# Patient Record
Sex: Female | Born: 1935 | Race: White | Hispanic: No | Smoking: Never smoker
Health system: Southern US, Community
[De-identification: ages and names within clinical notes are randomized; demographics above are authoritative.]

## PROBLEM LIST (undated history)

## (undated) DIAGNOSIS — Z9889 Other specified postprocedural states: Secondary | ICD-10-CM

## (undated) DIAGNOSIS — I509 Heart failure, unspecified: Secondary | ICD-10-CM

## (undated) DIAGNOSIS — I4891 Unspecified atrial fibrillation: Secondary | ICD-10-CM

## (undated) DIAGNOSIS — K219 Gastro-esophageal reflux disease without esophagitis: Secondary | ICD-10-CM

## (undated) DIAGNOSIS — E785 Hyperlipidemia, unspecified: Secondary | ICD-10-CM

## (undated) DIAGNOSIS — J449 Chronic obstructive pulmonary disease, unspecified: Secondary | ICD-10-CM

## (undated) DIAGNOSIS — I10 Essential (primary) hypertension: Secondary | ICD-10-CM

## (undated) DIAGNOSIS — Z972 Presence of dental prosthetic device (complete) (partial): Secondary | ICD-10-CM

## (undated) DIAGNOSIS — M1990 Unspecified osteoarthritis, unspecified site: Secondary | ICD-10-CM

## (undated) DIAGNOSIS — R0600 Dyspnea, unspecified: Secondary | ICD-10-CM

## (undated) DIAGNOSIS — R112 Nausea with vomiting, unspecified: Secondary | ICD-10-CM

## (undated) DIAGNOSIS — D649 Anemia, unspecified: Secondary | ICD-10-CM

## (undated) DIAGNOSIS — E119 Type 2 diabetes mellitus without complications: Secondary | ICD-10-CM

## (undated) DIAGNOSIS — G4730 Sleep apnea, unspecified: Secondary | ICD-10-CM

## (undated) HISTORY — PX: CHOLECYSTECTOMY: SHX55

## (undated) HISTORY — PX: ABDOMINAL HYSTERECTOMY: SHX81

## (undated) HISTORY — PX: SHOULDER ARTHROSCOPY: SHX128

---

## 2004-09-13 ENCOUNTER — Ambulatory Visit: Payer: Self-pay

## 2004-12-08 ENCOUNTER — Emergency Department: Payer: Self-pay | Admitting: Unknown Physician Specialty

## 2006-04-03 ENCOUNTER — Ambulatory Visit: Payer: Self-pay | Admitting: Family Medicine

## 2006-11-25 ENCOUNTER — Emergency Department: Payer: Self-pay

## 2007-02-12 ENCOUNTER — Ambulatory Visit: Payer: Self-pay | Admitting: Gastroenterology

## 2007-03-07 ENCOUNTER — Ambulatory Visit: Payer: Self-pay | Admitting: Gastroenterology

## 2007-06-25 ENCOUNTER — Ambulatory Visit: Payer: Self-pay | Admitting: Family Medicine

## 2008-04-03 ENCOUNTER — Emergency Department: Payer: Self-pay

## 2008-04-03 ENCOUNTER — Other Ambulatory Visit: Payer: Self-pay

## 2008-09-15 ENCOUNTER — Ambulatory Visit: Payer: Self-pay | Admitting: Family Medicine

## 2009-10-20 ENCOUNTER — Ambulatory Visit: Payer: Self-pay | Admitting: Family Medicine

## 2010-10-26 ENCOUNTER — Ambulatory Visit: Payer: Self-pay | Admitting: Family Medicine

## 2010-12-08 ENCOUNTER — Ambulatory Visit: Payer: Self-pay | Admitting: Unknown Physician Specialty

## 2011-03-21 ENCOUNTER — Ambulatory Visit: Payer: Self-pay | Admitting: Unknown Physician Specialty

## 2011-03-27 ENCOUNTER — Ambulatory Visit: Payer: Self-pay | Admitting: Unknown Physician Specialty

## 2011-05-25 ENCOUNTER — Ambulatory Visit: Payer: Self-pay | Admitting: Unknown Physician Specialty

## 2011-08-04 ENCOUNTER — Ambulatory Visit: Payer: Self-pay | Admitting: Unknown Physician Specialty

## 2012-01-09 LAB — BASIC METABOLIC PANEL
BUN: 20 mg/dL — ABNORMAL HIGH (ref 7–18)
Calcium, Total: 10 mg/dL (ref 8.5–10.1)
Co2: 27 mmol/L (ref 21–32)
EGFR (African American): 58 — ABNORMAL LOW
Osmolality: 282 (ref 275–301)
Potassium: 3.9 mmol/L (ref 3.5–5.1)

## 2012-01-09 LAB — CBC
HGB: 11.9 g/dL — ABNORMAL LOW (ref 12.0–16.0)
MCH: 25.3 pg — ABNORMAL LOW (ref 26.0–34.0)
MCHC: 31.2 g/dL — ABNORMAL LOW (ref 32.0–36.0)
MCV: 81 fL (ref 80–100)
Platelet: 228 10*3/uL (ref 150–440)
RBC: 4.68 10*6/uL (ref 3.80–5.20)
RDW: 14.6 % — ABNORMAL HIGH (ref 11.5–14.5)
WBC: 6.9 10*3/uL (ref 3.6–11.0)

## 2012-01-09 LAB — CK TOTAL AND CKMB (NOT AT ARMC): CK, Total: 50 U/L (ref 21–215)

## 2012-01-09 LAB — TROPONIN I: Troponin-I: <0.02 ng/mL

## 2012-01-10 ENCOUNTER — Observation Stay: Payer: Self-pay | Admitting: Student

## 2012-01-10 LAB — URINALYSIS, COMPLETE
Ketone: NEGATIVE
Leukocyte Esterase: NEGATIVE
Nitrite: POSITIVE
Ph: 5 (ref 4.5–8.0)
Protein: NEGATIVE
RBC,UR: 1 /HPF (ref 0–5)
Specific Gravity: 1.012 (ref 1.003–1.030)

## 2012-01-10 LAB — CK TOTAL AND CKMB (NOT AT ARMC)
CK, Total: 36 U/L (ref 21–215)
CK, Total: 43 U/L (ref 21–215)
CK-MB: 1.2 ng/mL (ref 0.5–3.6)
CK-MB: 1.1 ng/mL (ref 0.5–3.6)

## 2012-01-11 LAB — BASIC METABOLIC PANEL
BUN: 24 mg/dL — ABNORMAL HIGH (ref 7–18)
Calcium, Total: 9.2 mg/dL (ref 8.5–10.1)
Chloride: 103 mmol/L (ref 98–107)
Creatinine: 1.11 mg/dL (ref 0.60–1.30)
EGFR (African American): 57 — ABNORMAL LOW
Osmolality: 283 (ref 275–301)
Potassium: 3.8 mmol/L (ref 3.5–5.1)
Sodium: 140 mmol/L (ref 136–145)

## 2012-01-11 LAB — LIPID PANEL
Cholesterol: 147 mg/dL (ref 0–200)
HDL Cholesterol: 34 mg/dL — ABNORMAL LOW (ref 40–60)
Ldl Cholesterol, Calc: 49 mg/dL (ref 0–100)

## 2012-01-11 LAB — MAGNESIUM: Magnesium: 1.6 mg/dL — ABNORMAL LOW

## 2013-01-03 ENCOUNTER — Ambulatory Visit: Payer: Self-pay | Admitting: Gastroenterology

## 2013-09-04 ENCOUNTER — Emergency Department: Payer: Self-pay | Admitting: Emergency Medicine

## 2013-09-06 ENCOUNTER — Emergency Department: Payer: Self-pay | Admitting: Emergency Medicine

## 2013-09-09 ENCOUNTER — Ambulatory Visit: Payer: Self-pay | Admitting: Family Medicine

## 2013-11-24 ENCOUNTER — Emergency Department: Payer: Self-pay | Admitting: Emergency Medicine

## 2014-03-31 ENCOUNTER — Ambulatory Visit: Payer: Self-pay | Admitting: Family Medicine

## 2014-05-23 ENCOUNTER — Emergency Department: Payer: Self-pay | Admitting: Emergency Medicine

## 2014-12-31 ENCOUNTER — Ambulatory Visit
Admit: 2014-12-31 | Disposition: A | Discharge status: Home / Self Care | Payer: Self-pay | Attending: Family Medicine | Admitting: Family Medicine

## 2014-12-31 LAB — COMPREHENSIVE METABOLIC PANEL
ALBUMIN: 3.9 g/dL
ALT: 16 U/L
Alkaline Phosphatase: 76 U/L
Anion Gap: 8 (ref 7–16)
BILIRUBIN TOTAL: 0.4 mg/dL
BUN: 19 mg/dL
CREATININE: 0.99 mg/dL
Calcium, Total: 9 mg/dL
Chloride: 105 mmol/L
Co2: 29 mmol/L
EGFR (African American): >60
GFR CALC NON AF AMER: 55 — AB
GLUCOSE: 58 mg/dL — AB
Potassium: 4.5 mmol/L
SGOT(AST): 24 U/L
SODIUM: 142 mmol/L
TOTAL PROTEIN: 7 g/dL

## 2014-12-31 LAB — CBC WITH DIFFERENTIAL/PLATELET
BASOS PCT: 0.7 %
Basophil #: 0 10*3/uL (ref 0.0–0.1)
EOS ABS: 0.1 10*3/uL (ref 0.0–0.7)
EOS PCT: 1.8 %
HCT: 28 % — ABNORMAL LOW (ref 35.0–47.0)
HGB: 8.5 g/dL — AB (ref 12.0–16.0)
LYMPHS PCT: 18 %
Lymphocyte #: 1.1 10*3/uL (ref 1.0–3.6)
MCH: 22.1 pg — ABNORMAL LOW (ref 26.0–34.0)
MCHC: 30.3 g/dL — ABNORMAL LOW (ref 32.0–36.0)
MCV: 73 fL — ABNORMAL LOW (ref 80–100)
Monocyte #: 0.5 x10 3/mm (ref 0.2–0.9)
Monocyte %: 8.5 %
NEUTROS PCT: 71 %
Neutrophil #: 4.4 10*3/uL (ref 1.4–6.5)
Platelet: 187 10*3/uL (ref 150–440)
RBC: 3.84 10*6/uL (ref 3.80–5.20)
RDW: 15.7 % — ABNORMAL HIGH (ref 11.5–14.5)
WBC: 6.2 10*3/uL (ref 3.6–11.0)

## 2015-01-03 NOTE — H&P (Signed)
PATIENT NAME:  Dorothy [name], Dorothy [name] MR#:  [id number] DATE OF BIRTH:  1935/07/17  DATE OF ADMISSION:  01/10/2012  REFERRING PHYSICIAN: Dr. Owens Shark   PRIMARY PHYSICIAN: [facility]    PRESENTING COMPLAINT: Sent over from the clinic with diagnosis of new onset atrial fibrillation.   HISTORY OF PRESENT ILLNESS: Dorothy [name] is a pleasant 80 year old woman with history of diabetes, hypertension, hyperlipidemia, and gastric ulcers who presents from [facility] with diagnosis of new onset atrial fibrillation. Apparently the patient presented for routine check-up and on exam was found out to have irregular rhythm and EKG obtained was revealing for atrial fibrillation, rate of 107. Since her arrival here in the ED, she has remained normal sinus, rate controlled, and her EKG confirms a normal sinus rate. She denies any palpitations or sensations of dysrhythmia. No presyncope or syncope. Reports that she has been having nausea for one week now mainly in the morning but it would resolve. She has chronic baseline edema that gets worse when she is sedentary. She also reports baseline wheezing with allergies. She denies any prior history. Denies any fevers or chills. No dysuria or hematuria. No diarrhea.   PAST MEDICAL HISTORY:  1. Diabetes.  2. Allergies.  3. Hypertension.  4. Hyperlipidemia.  5. Gastroesophageal reflux disease.  6. Osteoarthritis.  7. Gastric ulcers.  8. 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  9. History of small bowel obstruction requiring surgery.  10. June 2008 colonoscopy with diverticulosis and EGD with hiatal hernia and noted Billroth-I anastomosis.   PAST SURGICAL HISTORY:  1. Left shoulder surgery.  2. Cholecystectomy.  3. Hysterectomy.  4. Appendectomy.  5. Billroth as above.   ALLERGIES: The patient is intolerant to aspirin.   MEDICATIONS:  1. Atenolol/chlorthalidone 50/25 mg daily.  2. Calcium 600 Plus D daily.  3. Furosemide 20 mg  daily.  4. Glipizide XL 10 mg every 12 hours.  5. Lovastatin 40 mg at bedtime.  6. Metformin 1000 mg b.i.d.  7. Nexium 40 mg daily.  8. Novolin N 35 units at bedtime.  9. Quinapril 40 mg b.i.d.  10. Tylenol as needed.   FAMILY HISTORY: Father died of lung cancer and he had hypertension. Mother had hypertension. Brother died of cancer. One sister had diabetes. Another sister died of cancer.   SOCIAL HISTORY: She lives in [place] alone. She has been widowed since January of 2013. She has two children. One lives 10 minutes away from her and another lives in [place]. Denies any tobacco, alcohol, or drug use.  REVIEW OF SYSTEMS: CONSTITUTIONAL: No fevers or chills. She endorses nausea in the morning. EYES: No glaucoma or cataracts. ENT: No epistaxis or discharge. RESPIRATORY: No cough. She reports some occasional wheezing with her allergies that's not new. No hemoptysis. No shortness of breath. CARDIOVASCULAR: As per history of present illness. GI: Endorses nausea in the morning but subsides as the day progresses. No vomiting, diarrhea, abdominal pain, hematemesis. GU: No dysuria or hematuria. ENDOCRINE: No polyuria or polydipsia. HEME: No easy bleeding. SKIN: No ulcers. MUSCULOSKELETAL: She has pain related to her arthritis. NEUROLOGIC: No history of stroke or seizure. PSYCH: Denies any suicidal ideation.      PHYSICAL EXAMINATION:   VITAL SIGNS: Temperature 98.3, pulse 76, respiratory rate 20, blood pressure 186/84, repeat pressure 181/83, sating at 95% on room air.   GENERAL: Lying in bed in no apparent distress.   HEENT: Normocephalic, atraumatic. Pupils equal, symmetric, nonicteric. Nares without discharge. Moist mucous  membrane.   NECK: Soft and supple. No adenopathy or JVP.   CARDIOVASCULAR: Non-tachy, regular. No murmurs, rubs, or gallops.   LUNGS: Faint basilar crackles. No use of accessory muscles or increased respiratory effort.   ABDOMEN: Soft. Positive bowel sounds. No  mass appreciated.   EXTREMITIES: Trace edema bilaterally. Dorsal pedis pulses intact.   MUSCULOSKELETAL: No joint effusion.   SKIN: No ulcer. She has hyperpigmentation of her lower extremity with taut skin.   NEUROLOGIC: No dysarthria or aphasia. Symmetrical strength. No focal deficits.   PSYCH: She is alert and oriented. The patient is cooperative.   PERTINENT LABS AND STUDIES: WBC 6.9, hemoglobin 11.9, hematocrit 38, platelets 228, MCV 81, glucose 170, BUN 20, creatinine 1.09, sodium 138, potassium 3.9, chloride 103, carbon dioxide 27, calcium 10. Troponin less than 0.02. CK 50. MB 1.6.   EKG with sinus rate of 79. No ST elevation or depression. There is T wave inversion in aVR and V1.   ASSESSMENT AND PLAN: Dorothy [name] is a pleasant 80 year old woman with history of hypertension, diabetes, hyperlipidemia, gastroesophageal reflux disease, gastric ulcers, and diverticulosis presenting from [facility] on routine follow-up with new onset atrial fibrillation.  1. New onset atrial fibrillation. She has some faint basilar crackles on exam but not severely hypoxic but she is 95% on room air. Will go ahead and obtain a chest x-ray. In the ED she has remained in normal sinus rhythm. Will also send TSH, mag level, and urinalysis. Continue on tele. Cycle cardiac enzymes. Her EKG is unrevealing. Will obtain an echocardiogram. The patient is intolerant to aspirin. Restart her atenolol. Will obtain Cardiology consultation. Additional diagnostics and anticoagulation recommendations per Cardiology.  2. Hypertension, uncontrolled. She has not had her medications today. Will resume atenolol/chlorthalidone and quinapril.  3. Diabetes. Restart her glipizide and Novolin N. Hold her metformin. Continue on sliding scale insulin. Send an A1c.  4. Hyperlipidemia. Restart lovastatin. Send a fasting lipid panel.  5. Prophylaxis. On Nexium and Lovenox.   TIME SPENT: Approximately 50 minutes spent on patient  care.   ____________________________ Rita Ohara, MD ap:drc D: 01/10/2012 01:22:00 ET T: 01/10/2012 08:51:08 ET JOB#: [id number]  cc: Brien Few Yorel Redder, MD, <Dictator> [place] MD ELECTRONICALLY SIGNED 01/18/2012 23:24

## 2015-01-03 NOTE — Discharge Summary (Signed)
PATIENT NAME:  Dorothy [name], Dorothy [name] MR#:  [id number] DATE OF BIRTH:  Sep 06, 1935  DATE OF ADMISSION:  01/10/2012 DATE OF DISCHARGE:  01/11/2012  REFERRING PHYSICIAN: Dr. Owens Shark   PRIMARY CARE PHYSICIAN: [facility]   CHIEF COMPLAINT: Sent over from the clinic with diagnosis of new onset atrial fibrillation.   DISCHARGE DIAGNOSES:  1. Possible atrial fibrillation without any recurrence and without any history of atrial fibrillation. 2. Uncontrolled diabetes. 3. Elevated triglycerides. 4. Hypertension.  5. Hyperlipidemia.  6. Gastroesophageal reflux disease.  7. Osteoarthritis.  8. History of gastric ulcers. 9. History of left leg squamous cell carcinoma status post resection. 10. History of SBO status post surgery.   DISCHARGE MEDICATIONS:  1. Atenolol/chlorthalidone 50/25 mg 1 tab daily.  2. Calcium 600 mg Plus D daily.  3. Lasix 20 mg daily.  4. Glipizide XL 10 mg every 12 hours. 5. Lovastatin 40 mg at bedtime.  6. Metformin 1000 mg p.o. b.i.d.  7. Nexium 40 mg daily.  8. Novolin N 35 units at bedtime.  9. Quinapril 40 mg b.i.d.  10. Tylenol as needed.   DISPOSITION: Home.   DIET: Low sodium, ADA, diabetic diet.   ACTIVITY: As tolerated.   FOLLOW-UP:  1. Please follow-up with your primary care physician in 1 to 2 days for diabetes and triglyceride management. 2. Follow-up of the echocardiogram that was done here which is not back yet.   HISTORY OF PRESENT ILLNESS/HOSPITAL COURSE: For full details of history and physical, please see the dictation done on 01/10/2012 by Dr. Inez Catalina. Briefly, this is a 80 year old female with diabetes, hypertension, hyperlipidemia, and gastric ulcers who was referred for further evaluation after an EKG at the office had shown possible atrial fibrillation. The rate was 107. She has had no further episodes of atrial fibrillation. In the ER she was noted to have an EKG with normal sinus rhythm. She was started on telemetry for rhythm  monitoring. Her outpatient medications were resumed.   Her significant labs were as follows: BUN on arrival 20, serum glucose 170, triglycerides 319, LDL of 49, HDL 34. Hemoglobin A1c elevated at 9.6. Troponins were negative x3. TSH was 1.62. Urinalysis has positive nitrites but no leukocyte esterase and 1+ bacteria; however, the patient has no dysuria. An echocardiogram was done which has not been finalized yet. X-ray of the chest, one view, does not show any acute evidence of pulmonary edema or any other acute cardiopulmonary abnormalities.   The patient was admitted for observation. Cyclic cardiac markers were negative. She had no symptoms of dizziness, chest pains, palpitations, or visual problems. She was placed on remote tele and observed overnight and had no further recurrence of this arrhythmia. On further evaluation of EKG sent from the primary care's office, do see some P waves in 1 at times as well as 2. The EKG that was sent does have conversion to regular sinus rhythm on the same EKG strip. Given that she has no history of atrial fibrillation and I see some P waves at some of the waves, I would not start the patient on anticoagulation for now. She is adequately rate controlled. She has had rates of 50's and 60's on the atenolol here. TSH was checked and was within normal limits. At this point given that there have been no further recurrence of this rhythm, she will be discharged with outpatient follow-up.   With regards to the high blood sugars, her hemoglobin A1c was checked which was mid 9's. She needs a tighter  control of her blood sugars as an outpatient. Furthermore, she was noted to have high triglycerides in the 300's and she should be placed on a triglyceride lowering medication. This can all be done as an outpatient. At this point, she will be discharged with outpatient follow-up with her primary care physician at [facility].   TOTAL TIME SPENT: 35 minutes.    ____________________________ Vivien Presto, MD sa:drc D: 01/11/2012 14:48:53 ET T: 01/12/2012 10:33:05 ET JOB#: [id number]  cc: Vivien Presto, MD, <Dictator> [place] MD ELECTRONICALLY SIGNED 01/24/2012 17:55

## 2015-01-03 NOTE — Consult Note (Signed)
PATIENT NAME:  KALAN, RANG MR#:  [id number] DATE OF BIRTH:  Jan 22, 1935  DATE OF CONSULTATION:  01/11/2012  REFERRING PHYSICIAN:  Alounthith Phichith, MD  CONSULTING PHYSICIAN:  Caitlin Hillmer D. Clayborn Bigness, MD  PRIMARY CARE PHYSICIAN: [facility]   INDICATION: Atrial fibrillation.   HISTORY OF PRESENT ILLNESS: Ms. Bunner is a 80 year old female with history of diabetes, hypertension, hyperlipidemia, and ulcers who recently complained of GI upset with diarrhea and fatigue. She was found to be in atrial fibrillation on routine check in the office with rates above 100. She was sent to the Emergency Room for further evaluation. Rate was controlled. Repeat EKG suggested sinus rhythm. She denies any significant palpitations, tachycardia, or arrhythmias. She's only had diarrhea. No presyncope. No chest pain. She's had some nausea for about a week or so. She has had baseline leg edema, mild wheezing related to allergies. No headaches. Except for the weakness and the diarrhea, she feels okay.  REVIEW OF SYSTEMS: No blackout spells or syncope. Mild nausea. No vomiting. Denies fever, chills, or sweats. No weight loss, no weight gain. No hemoptysis or hematemesis. Denies bright red blood per rectum. Denies vision change or hearing change. Denies sputum production. Denies cough.   PAST MEDICAL HISTORY:  1. Diabetes.  2. Allergies.  3. Hypertension.  4. Hyperlipidemia.  5. Reflux. 6. Osteoarthritis. 7. Ulcers.  8. Moderate coronary artery disease. 9. Chest pain. 10. Occasional angina.   PAST SURGICAL HISTORY:  1. Left leg squamous cell cancer status post resection. 2. Small bowel obstruction requiring surgery.  3. Colonoscopy for diverticulitis.  4. Hiatal hernia treated with Billroth-I. 5. Left shoulder surgery. 6. Cholecystectomy. 7. Hysterectomy. 8. Appendectomy.   MEDICATIONS:  1. Atenolol/chlorthalidone 50/25 daily.  2. Calcium once a day.  3. Furosemide 20 a day.  4. Glipizide XL  10 every 12 hours. 5. Lovastatin 40 a day.  6. Metformin 1000 twice a day.  7. Nexium 40 a day.  8. Novolin N 35 units at bedtime.  9. Quinapril 40 twice a day.  10. Tylenol as needed.   FAMILY HISTORY: Lung cancer, hypertension, cancer, diabetes.   SOCIAL HISTORY: Lives in [place]. Widowed. Two children. No smoking or alcohol consumption.   PHYSICAL EXAMINATION:   VITAL SIGNS: Blood pressure 180/80, pulse 75, respiratory rate 16, afebrile.   HEENT: Normocephalic, atraumatic. Pupils reactive to light.   NECK: Supple. No JVD, bruits, or adenopathy.   LUNGS: Clear to auscultation and percussion. No significant wheeze, rhonchi, or rales.    HEART: Regular rate and rhythm. Positive S4. Systolic ejection murmur left sternal border.   ABDOMEN: Benign.   EXTREMITIES: Within normal limits.   NEUROLOGIC: Intact.   SKIN: Normal   LABORATORY, DIAGNOSTIC, AND RADIOLOGICAL DATA: White count 6.9, hemoglobin 11.9, hematocrit 38, platelet count 228, BUN 20, creatinine 1.09, glucose 170, sodium 138, potassium 3.9. Troponin was negative. MB and CK were negative.   EKG normal sinus rhythm, rate of 80, nonspecific ST-T wave changes.   ASSESSMENT:  1. History of atrial fibrillation, now sinus. 2. Hypertension. 3. Diabetes. 4. Hyperlipidemia. 5. Atypical chest pain. 6. History of moderate coronary artery disease.   PLAN:  1. Will treat medically. 2. Will consider whether long-term anticoagulation is necessary. 3. Will probably treat the patient conservatively as she's had trouble even with aspirin in the past with stomach upset and I'm concerned that long-term anticoagulation will pose a problem with her history of reflux and gastroesophageal reflux disease. Now she's in sinus.  4. Recommend weight  loss and exercise.  5. Continue blood pressure control.  6. Recommend conservative approach for her atrial fibrillation at this point.  ____________________________ Loran Senters. Clayborn Bigness,  MD ddc:drc D: 01/11/2012 16:42:24 ET T: 01/12/2012 08:11:21 ET JOB#: [id number]  cc: Evon Dejarnett D. Clayborn Bigness, MD, <Dictator> Yolonda Kida MD ELECTRONICALLY SIGNED 01/17/2012 22:57

## 2015-01-08 ENCOUNTER — Inpatient Hospital Stay
Admission: AD | Admit: 2015-01-08 | Discharge: 2015-01-10 | DRG: 293 | Disposition: A | Discharge status: Home / Self Care | Payer: Medicare Other | Attending: Internal Medicine | Admitting: Internal Medicine

## 2015-01-08 DIAGNOSIS — M810 Age-related osteoporosis without current pathological fracture: Secondary | ICD-10-CM | POA: Diagnosis present

## 2015-01-08 DIAGNOSIS — Z801 Family history of malignant neoplasm of trachea, bronchus and lung: Secondary | ICD-10-CM

## 2015-01-08 DIAGNOSIS — Z79899 Other long term (current) drug therapy: Secondary | ICD-10-CM | POA: Diagnosis not present

## 2015-01-08 DIAGNOSIS — J8410 Pulmonary fibrosis, unspecified: Secondary | ICD-10-CM | POA: Diagnosis present

## 2015-01-08 DIAGNOSIS — Z794 Long term (current) use of insulin: Secondary | ICD-10-CM | POA: Diagnosis not present

## 2015-01-08 DIAGNOSIS — D649 Anemia, unspecified: Secondary | ICD-10-CM | POA: Diagnosis present

## 2015-01-08 DIAGNOSIS — I10 Essential (primary) hypertension: Secondary | ICD-10-CM | POA: Diagnosis present

## 2015-01-08 DIAGNOSIS — Z9889 Other specified postprocedural states: Secondary | ICD-10-CM

## 2015-01-08 DIAGNOSIS — Z7951 Long term (current) use of inhaled steroids: Secondary | ICD-10-CM | POA: Diagnosis not present

## 2015-01-08 DIAGNOSIS — I5033 Acute on chronic diastolic (congestive) heart failure: Secondary | ICD-10-CM | POA: Diagnosis present

## 2015-01-08 DIAGNOSIS — I4891 Unspecified atrial fibrillation: Secondary | ICD-10-CM | POA: Diagnosis present

## 2015-01-08 DIAGNOSIS — E119 Type 2 diabetes mellitus without complications: Secondary | ICD-10-CM | POA: Diagnosis present

## 2015-01-08 DIAGNOSIS — E785 Hyperlipidemia, unspecified: Secondary | ICD-10-CM | POA: Diagnosis present

## 2015-01-08 LAB — CBC
HCT: 28.7 % — ABNORMAL LOW (ref 35.0–47.0)
HGB: 8.6 g/dL — ABNORMAL LOW (ref 12.0–16.0)
MCH: 22.3 pg — ABNORMAL LOW (ref 26.0–34.0)
MCHC: 30.1 g/dL — ABNORMAL LOW (ref 32.0–36.0)
MCV: 74 fL — AB (ref 80–100)
PLATELETS: 219 10*3/uL (ref 150–440)
RBC: 3.88 10*6/uL (ref 3.80–5.20)
RDW: 16.1 % — AB (ref 11.5–14.5)
WBC: 6.9 10*3/uL (ref 3.6–11.0)

## 2015-01-08 LAB — BASIC METABOLIC PANEL
Anion Gap: 7 (ref 7–16)
BUN: 18 mg/dL
CO2: 27 mmol/L
CREATININE: 1.03 mg/dL — AB
Calcium, Total: 9.1 mg/dL
Chloride: 104 mmol/L
EGFR (African American): >60
EGFR (Non-African Amer.): 52 — ABNORMAL LOW
GLUCOSE: 133 mg/dL — AB
POTASSIUM: 4.8 mmol/L
SODIUM: 138 mmol/L

## 2015-01-08 LAB — TROPONIN I
Troponin-I: <0.03 ng/mL
Troponin-I: <0.03 ng/mL
Troponin-I: <0.03 ng/mL

## 2015-01-08 LAB — IRON AND TIBC
IRON BIND. CAP.(TOTAL): 508 — AB (ref 250–450)
IRON SATURATION: 3.3
Iron: 17 ug/dL — ABNORMAL LOW
UNBOUND IRON-BIND. CAP.: 491.5

## 2015-01-08 LAB — FERRITIN: FERRITIN (ARMC): 6 ng/mL — AB

## 2015-01-08 LAB — RETICULOCYTES
ABSOLUTE RETIC COUNT: 0.0651 10*6/uL (ref 0.019–0.186)
Reticulocyte: 1.67 % (ref 0.4–3.1)

## 2015-01-08 LAB — PRO B NATRIURETIC PEPTIDE: B-TYPE NATIURETIC PEPTID: 321 pg/mL — AB

## 2015-01-09 LAB — BASIC METABOLIC PANEL
ANION GAP: 7 (ref 7–16)
BUN: 17 mg/dL
CALCIUM: 9.2 mg/dL
CHLORIDE: 104 mmol/L
CREATININE: 1.33 mg/dL — AB
Co2: 30 mmol/L
EGFR (African American): 45 — ABNORMAL LOW
GFR CALC NON AF AMER: 38 — AB
GLUCOSE: 142 mg/dL — AB
Potassium: 4.3 mmol/L
Sodium: 141 mmol/L

## 2015-01-09 LAB — CBC WITH DIFFERENTIAL/PLATELET
BASOS ABS: 0 10*3/uL (ref 0.0–0.1)
Basophil %: 0.3 %
Eosinophil #: 0.1 10*3/uL (ref 0.0–0.7)
Eosinophil %: 1.6 %
HCT: 26.8 % — AB (ref 35.0–47.0)
HGB: 8.4 g/dL — ABNORMAL LOW (ref 12.0–16.0)
LYMPHS PCT: 20.4 %
Lymphocyte #: 1.4 10*3/uL (ref 1.0–3.6)
MCH: 22.8 pg — ABNORMAL LOW (ref 26.0–34.0)
MCHC: 31.3 g/dL — ABNORMAL LOW (ref 32.0–36.0)
MCV: 73 fL — ABNORMAL LOW (ref 80–100)
MONOS PCT: 7.3 %
Monocyte #: 0.5 x10 3/mm (ref 0.2–0.9)
NEUTROS ABS: 4.7 10*3/uL (ref 1.4–6.5)
Neutrophil %: 70.4 %
PLATELETS: 205 10*3/uL (ref 150–440)
RBC: 3.67 10*6/uL — ABNORMAL LOW (ref 3.80–5.20)
RDW: 16 % — ABNORMAL HIGH (ref 11.5–14.5)
WBC: 6.7 10*3/uL (ref 3.6–11.0)

## 2015-01-09 MED ORDER — ALBUTEROL SULFATE (2.5 MG/3ML) 0.083% IN NEBU: 2.5000 mg | INHALATION_SOLUTION | RESPIRATORY_TRACT | Status: DC | PRN

## 2015-01-09 MED ORDER — ACETAMINOPHEN 325 MG PO TABS: 650.0000 mg | ORAL_TABLET | ORAL | Status: DC | PRN

## 2015-01-09 MED ORDER — ZOLPIDEM TARTRATE 5 MG PO TABS: 5.0000 mg | ORAL_TABLET | Freq: Every evening | ORAL | Status: DC | PRN

## 2015-01-09 MED ORDER — AMLODIPINE BESYLATE 5 MG PO TABS
5.0000 mg | ORAL_TABLET | Freq: Every day | ORAL | Status: DC
  Administered 2015-01-10: 5 mg via ORAL
  Filled 2015-01-09: qty 1

## 2015-01-09 MED ORDER — ONDANSETRON HCL 4 MG/2ML IJ SOLN: 4.0000 mg | INTRAMUSCULAR | Status: DC | PRN

## 2015-01-09 MED ORDER — GLIPIZIDE 10 MG PO TABS
10.0000 mg | ORAL_TABLET | Freq: Two times a day (BID) | ORAL | Status: DC
  Administered 2015-01-10: 10 mg via ORAL
  Filled 2015-01-09: qty 1

## 2015-01-09 MED ORDER — FUROSEMIDE 10 MG/ML IJ SOLN: 20.0000 mg | Freq: Two times a day (BID) | INTRAMUSCULAR | Status: DC

## 2015-01-09 MED ORDER — INSULIN DETEMIR 100 UNIT/ML ~~LOC~~ SOLN
20.0000 [IU] | Freq: Every day | SUBCUTANEOUS | Status: DC
  Filled 2015-01-09: qty 0.2

## 2015-01-09 MED ORDER — PRAVASTATIN SODIUM 20 MG PO TABS: 40.0000 mg | ORAL_TABLET | Freq: Every day | ORAL | Status: DC

## 2015-01-09 MED ORDER — CLONIDINE HCL 0.1 MG PO TABS
0.1000 mg | ORAL_TABLET | Freq: Two times a day (BID) | ORAL | Status: DC
  Administered 2015-01-10: 0.1 mg via ORAL
  Filled 2015-01-09: qty 1

## 2015-01-09 MED ORDER — PANTOPRAZOLE SODIUM 40 MG PO TBEC
40.0000 mg | DELAYED_RELEASE_TABLET | Freq: Every day | ORAL | Status: DC
  Administered 2015-01-10: 40 mg via ORAL
  Filled 2015-01-09: qty 1

## 2015-01-09 MED ORDER — ALBUTEROL SULFATE (2.5 MG/3ML) 0.083% IN NEBU: 2.5000 mg | INHALATION_SOLUTION | RESPIRATORY_TRACT | Status: DC

## 2015-01-09 MED ORDER — INSULIN ASPART 100 UNIT/ML ~~LOC~~ SOLN: 0.0000 [IU] | Freq: Three times a day (TID) | SUBCUTANEOUS | Status: DC

## 2015-01-09 MED ORDER — CALCIUM CARBONATE-VITAMIN D 500-200 MG-UNIT PO TABS
1.0000 | ORAL_TABLET | Freq: Every day | ORAL | Status: DC
  Administered 2015-01-10: 1 via ORAL
  Filled 2015-01-09: qty 1

## 2015-01-09 MED ORDER — METFORMIN HCL ER 500 MG PO TB24
500.0000 mg | ORAL_TABLET | Freq: Two times a day (BID) | ORAL | Status: DC
  Administered 2015-01-10: 500 mg via ORAL
  Filled 2015-01-09: qty 1

## 2015-01-09 MED ORDER — APIXABAN 5 MG PO TABS
5.0000 mg | ORAL_TABLET | Freq: Two times a day (BID) | ORAL | Status: DC
  Administered 2015-01-10: 5 mg via ORAL
  Filled 2015-01-09: qty 1

## 2015-01-09 MED ORDER — SODIUM CHLORIDE 0.9 % IJ SOLN: 3.0000 mL | INTRAMUSCULAR | Status: DC | PRN

## 2015-01-09 MED ORDER — METOPROLOL TARTRATE 25 MG PO TABS
25.0000 mg | ORAL_TABLET | Freq: Two times a day (BID) | ORAL | Status: DC
  Administered 2015-01-10: 25 mg via ORAL
  Filled 2015-01-09: qty 1

## 2015-01-10 DIAGNOSIS — I5033 Acute on chronic diastolic (congestive) heart failure: Secondary | ICD-10-CM

## 2015-01-10 LAB — BASIC METABOLIC PANEL
Anion gap: 10 (ref 5–15)
BUN: 21 mg/dL — AB (ref 6–20)
CALCIUM: 9 mg/dL (ref 8.9–10.3)
CO2: 29 mmol/L (ref 22–32)
Chloride: 99 mmol/L — ABNORMAL LOW (ref 101–111)
Creatinine, Ser: 1.25 mg/dL — ABNORMAL HIGH (ref 0.44–1.00)
GFR, EST AFRICAN AMERICAN: 47 mL/min — AB (ref >60)
GFR, EST NON AFRICAN AMERICAN: 40 mL/min — AB (ref >60)
GLUCOSE: 134 mg/dL — AB (ref 65–99)
Potassium: 4.1 mmol/L (ref 3.5–5.1)
SODIUM: 138 mmol/L (ref 135–145)

## 2015-01-10 LAB — GLUCOSE, CAPILLARY
GLUCOSE-CAPILLARY: 132 mg/dL — AB (ref 70–99)
GLUCOSE-CAPILLARY: 141 mg/dL — AB (ref 70–99)

## 2015-01-10 LAB — HEMOGLOBIN A1C: Hgb A1c MFr Bld: 6.7 % — ABNORMAL HIGH (ref 4.0–6.0)

## 2015-01-10 LAB — OCCULT BLOOD X 1 CARD TO LAB, STOOL: FECAL OCCULT BLD: NEGATIVE

## 2015-01-10 MED ORDER — FUROSEMIDE 20 MG PO TABS: 20.0000 mg | ORAL_TABLET | Freq: Every day | ORAL | Status: DC

## 2015-01-10 MED ORDER — FUROSEMIDE 20 MG PO TABS
20.0000 mg | ORAL_TABLET | Freq: Every day | ORAL | Status: DC
  Administered 2015-01-10: 20 mg via ORAL
  Filled 2015-01-10: qty 1

## 2015-01-10 MED ORDER — AMLODIPINE BESYLATE 5 MG PO TABS: 5.0000 mg | ORAL_TABLET | Freq: Every day | ORAL | Status: DC

## 2015-01-10 MED ORDER — METOPROLOL TARTRATE 25 MG PO TABS: 25.0000 mg | ORAL_TABLET | Freq: Two times a day (BID) | ORAL | Status: DC

## 2015-01-10 NOTE — Progress Notes (Signed)
Previous documentation in SCM, [id number]

## 2015-01-10 NOTE — H&P (Addendum)
PATIENT NAME:  Dorothy [name], HODEL MR#:  [id number] DATE OF BIRTH:  07-02-1935  DATE OF ADMISSION:  01/08/2015  PRIMARY CARE PHYSICIAN: Rutherford Guys MD.   CHIEF COMPLAINT: Shortness of breath and lower extremity edema.   HISTORY OF PRESENT ILLNESS: This is a 80 year old female who presented to the hospital complaining of a 2 week history of worsening lower extremity edema and also shortness of breath on exertion. She also says that she cannot lie flat and sleep, and she has to use 2 pillows to keep herself propped up. Since her symptoms are not improving she came to the ER for further evaluation. In the Emergency Room the patient was not noted to be hypoxic, but was noted to have significant edema and shortness of breath on exertion. The patient was suspected to have mild CHF and hospitalist services were contacted for further treatment and evaluation. The patient also on blood work was incidentally noted to be anemic with a hemoglobin around 8.5. She does not have any previous history of anemia. She denies any evidence of acute bleeding with melena, hematochezia, hematuria, hemoptysis, or any other associated symptoms.   REVIEW OF SYSTEMS:    CONSTITUTIONAL: No documented fever. Positive fatigue and weakness. No weight gain or weight loss.  EYES: No blurry or double vision.  EARS, NOSE, THROAT: No tinnitus. No postnasal drip. No redness of the oropharynx.  RESPIRATORY:  No cough. No wheeze. No hemoptysis. Positive dyspnea.  CARDIOVASCULAR: No chest pain. Positive 2 pillow orthopnea. Positive peripheral edema. Positive dyspnea on exertion. No palpitation or syncope.  GASTROINTESTINAL: No nausea, no vomiting, no diarrhea. No abdominal pain. No melena or hematochezia.  GENITOURINARY: No dysuria or hematuria.  ENDOCRINE: No polyuria, nocturia, heat or cold intolerance.   HEMATOLOGIC:  No anemia, no bruising, no bleeding.  INTEGUMENTARY: No rashes. No lesions.  MUSCULOSKELETAL: No arthritis, no swelling,  no gout.  NEUROLOGIC: No numbness or tingling. No ataxia. No seizure-type activity.  PSYCHIATRIC: No anxiety, no insomnia. No ADD.   PAST MEDICAL HISTORY: Consistent with hypertension, hyperlipidemia, diabetes, osteoporosis.   ALLERGIES: No known drug allergies.   SOCIAL HISTORY: No smoking. No alcohol abuse. No illicit drug abuse. Lives at home by herself.   FAMILY HISTORY: The patient's mother and father are both deceased. Father died from lung cancer. Mother died from a blood clot.   CURRENT MEDICATIONS:  Albuterol inhaler 2 puffs 4 times daily as needed, calcium with vitamin D 1 tab daily, clonidine 0.1 mg b.i.d., glipizide XL 10 mg b.i.d., lovastatin 40 mg daily, metformin 500 mg extended release b.i.d., Nexium 20 mg daily, and Novolin N 35 units at bedtime.   PHYSICAL EXAMINATION:   VITAL SIGNS: Temperature is 98.2, pulse 95, respirations 19, blood pressure 149/81, saturation 96% on room air.  GENERAL: She is a pleasant-appearing female in no apparent distress.  HEAD, EYES, EARS, NOSE, AND THROAT: Atraumatic, normocephalic. Extraocular muscles are intact. Pupils equal and reactive to light. Sclerae anicteric. No conjunctival injection. No pharyngeal erythema.  NECK: Supple. There is no jugular venous distention. No bruits. No lymphadenopathy. No thyromegaly.  HEART: Regular rate and rhythm. No murmurs. No rubs. No clicks.  LUNGS:  Clear to auscultation bilaterally. No rales or rhonchi. No wheezes.  ABDOMEN: Soft, flat, nontender, nondistended. Has good bowel sounds. No hepatosplenomegaly appreciated.  EXTREMITIES: No evidence of cyanosis, clubbing. She does have + 1-2 pitting edema from the knees to the ankles bilaterally, + 2 pedal and radial pulses bilaterally.  NEUROLOGIC: She is alert, awake,  and oriented x 3, with no focal motor or sensory deficits appreciated bilaterally.  SKIN: Moist and warm with no rashes.  LYMPHATIC: There is no cervical or axillary lymphadenopathy.    LABORATORY DATA: Serum glucose of 133, BUN 18, creatinine 1.03, sodium 138, potassium 4.8, chloride 104, bicarbonate 27. Troponin less than 0.03. White cell count 6.9, hemoglobin 8.6, hematocrit 28.7, platelet count 219,000, MCV of 74. The patient did have a chest x-ray done which shows stable cardiomegaly, but no acute cardiopulmonary disease.   ASSESSMENT AND PLAN: This is a 80 year old female with history of diabetes, hypertension, hyperlipidemia, osteoporosis, who presented to the hospital with shortness of breath on exertion and also lower extremity edema, noted to be in mild congestive heart failure, and also noted to be anemic.   1.  Shortness of breath. The exact etiology of this is unclear, but suspected to be secondary to mild congestive heart failure and maybe even symptomatic anemia. For now will diurese her with IV Lasix, follow Is and Os and daily weights, and follow her clinically. As far as her anemia is concerned we will check a Hemoccult, check iron studies to work up her anemia. There is no urgent need for transfusion.  2.  Congestive heart failure.  The patient does complain of 2 pillow orthopnea and lower extremity edema which are symptoms consistent with congestive heart failure although the BNP is only mildly elevated. For now we will diurese her with IV Lasix, follow Is and Os and daily weights. We will get a cardiology consult, check a 2-dimensional echocardiogram, continue her clonidine for now. The patient was recently taken off her ACE inhibitor. If needed we will consider starting her on low-dose lisinopril.  3.  Anemia. This is a microcytic anemia. The patient has not had any evidence of acute bleeding. I will check iron studies and Hemoccult, follow hemoglobin. If her hemoglobin falls below 7 I will transfuse.  4.  Diabetes. Continue her glipizide, metformin. Will also start her on some low-dose Levemir. 5.  Hyperlipidemia. Continue with her lovastatin.  6.  Hypertension.  Continue clonidine.   CODE STATUS: The patient is a full code.   TIME SPENT ON ADMISSION: 50 minutes.      ____________________________ Belia Heman. Verdell Carmine, MD vjs:bu D: 01/08/2015 16:35:15 ET T: 01/08/2015 16:43:02 ET JOB#: [id number]  cc: Belia Heman. Verdell Carmine, MD, <Dictator> Henreitta Leber MD ELECTRONICALLY SIGNED 01/08/2015 22:21

## 2015-01-10 NOTE — Progress Notes (Signed)
All instructions reviewed with pt. and family, all questions answered. Prescriptions given to pt. IV removed. Pt. Taken out by wheelchair

## 2015-01-10 NOTE — Consult Note (Signed)
GI Inpatient Consult Note  Reason for Consult:  IDA   Attending Requesting Consult:  Dr Ether Griffins  History of Present Illness: Dorothy [name] is a 80 y.o. female with PMHX notable for CHF, A-fib on anti-caogulation presenting for evaluation of LE swelling, SOB.  In working this up, was noted to have an anemia with Hgb 8.  She denies any overt rectal bleeding, melena.  Also denies wt loss, f/c,  N/v, abd pain.    No prev hx of anemia.   Last colonolscopy: 2 - 3 years ago with Dr Gustavo Lah.     Last EGD: over 10 years ago.   No fam hx GI malignancy.    Past Medical History:   hypertension, hyperlipidemia, diabetes, osteoporosis.   Problem List: Patient Active Problem List   Diagnosis Date Noted  . CHF (congestive heart failure) 01/10/2015    Allergies: No Known Allergies  Home Medications: Prescriptions prior to admission  Medication Sig Dispense Refill Last Dose  . albuterol (PROVENTIL HFA;VENTOLIN HFA) 108 (90 BASE) MCG/ACT inhaler Inhale 2 puffs into the lungs every 6 (six) hours as needed for wheezing or shortness of breath.     . Calcium Carb-Cholecalciferol (CALCIUM 600 + D PO) Take 1 tablet by mouth.     . cloNIDine (CATAPRES) 0.1 MG tablet Take 0.1 mg by mouth 2 (two) times daily.     Marland Kitchen esomeprazole (NEXIUM) 20 MG capsule Take 20 mg by mouth at bedtime.     Marland Kitchen glipiZIDE (GLUCOTROL XL) 10 MG 24 hr tablet Take 10 mg by mouth 2 (two) times daily.     . insulin NPH Human (HUMULIN N,NOVOLIN N) 100 UNIT/ML injection Inject 35 Units into the skin at bedtime.     . lovastatin (MEVACOR) 40 MG tablet Take 40 mg by mouth at bedtime.     . metFORMIN (GLUCOPHAGE-XR) 500 MG 24 hr tablet Take 500 mg by mouth 2 (two) times daily.      Home medication reconciliation was completed with the patient.   Scheduled Inpatient Medications:   . amLODipine  5 mg Oral Daily  . calcium-vitamin D  1 tablet Oral Q breakfast  . cloNIDine  0.1 mg Oral BID  . furosemide  20 mg Oral Daily  .  glipiZIDE  10 mg Oral BID AC  . insulin aspart  0-10 Units Subcutaneous TID AC & HS  . insulin detemir  20 Units Subcutaneous QHS  . metFORMIN  500 mg Oral BID WC  . metoprolol tartrate  25 mg Oral BID  . pantoprazole  40 mg Oral Daily  . pravastatin  40 mg Oral QHS    Continuous Inpatient Infusions:     PRN Inpatient Medications:  acetaminophen, albuterol, ondansetron (ZOFRAN) IV, sodium chloride, zolpidem  Family History: family history is not on file.  The patient's family history is negative for inflammatory bowel disorders, GI malignancy, or solid organ transplantation.  Social History:    The patient denies ETOH, tobacco, or drug use.   Review of Systems: Constitutional: Weight is stable.  Eyes: No changes in vision. ENT: No oral lesions, sore throat.  GI: see HPI.  Heme/Lymph: No easy bruising.  CV: No chest pain.  GU: No hematuria.  Integumentary: No rashes.  Neuro: No headaches.  Psych: No depression/anxiety.  Endocrine: No heat/cold intolerance.  Allergic/Immunologic: No urticaria.  Resp: No cough, SOB.  Musculoskeletal: No joint swelling.    Physical Examination: BP 140/70 mmHg  Pulse 78  Temp(Src) 98.1 F (36.7 C) (Oral)  Resp 18  Ht 5' 5.98" (1.676 m)  Wt 214 lb 15.9 oz (97.52 kg)  BMI 34.72 kg/m2  SpO2 96% Gen: NAD, alert and oriented x 4 HEENT: PEERLA, EOMI, Neck: supple, no JVD or thyromegaly Chest: CTA bilaterally, no wheezes, crackles, or other adventitious sounds CV: RRR, no m/g/c/r Abd: soft, NT, ND, +BS in all four quadrants; no HSM, guarding, ridigity, or rebound tenderness Ext: no edema, well perfused with 2+ pulses, Skin: no rash or lesions noted Lymph: no LAD  Data: Lab Results  Component Value Date   WBC 6.7 01/09/2015   HGB 8.4* 01/09/2015   HCT 26.8* 01/09/2015   MCV 73* 01/09/2015   PLT 205 01/09/2015    Recent Labs Lab 01/08/15 1237 01/09/15 0542  HGB 8.6* 8.4*   Lab Results  Component Value Date   NA 138  01/10/2015   K 4.1 01/10/2015   CL 99* 01/10/2015   CO2 29 01/10/2015   BUN 21* 01/10/2015   CREATININE 1.25* 01/10/2015   Lab Results  Component Value Date   ALT 16 12/31/2014   AST 24 12/31/2014   ALKPHOS 76 12/31/2014   No results for input(s): APTT, INR, PTT in the last 168 hours.   Assessment/Plan: Dorothy [name] is a 80 y.o. female  PMHX notable for CHF, A-fib on anti-caogulation presenting for evaluation of LE swelling, SOB.  Has severe IDA on lab testing. Given the need to resume anti-coag, would recommend EGD / Colon. Since she already ate two solid meals today and is stable for discharge from CHF standpoint, I will plan to have her come in this week for outpt EGD and colon and she can resume her anti-coag after those procedures.  Recommendations: - colon / EGD later this week as outpt for IDA - hold anti-coag - no iron until after procedures - further recs pending above.   Thank you for the consult. Please call with questions or concerns.  Maryalyce Sanjuan, Grace Blight, MD

## 2015-01-10 NOTE — Progress Notes (Signed)
Patient Demographics  Dorothy [name], is a 80 y.o. female, DOB - 27-Nov-1934, [id number]  Admit date - 01/08/2015   Admitting Physician Henreitta Leber, MD  Outpatient Primary MD for the patient is No primary care provider on file.    No chief complaint on file.     Came in with SOb, Leg swelling, noted to have a. fib, rate at 90-100, no palpitations, feels good today, being continued on lasix, less swelling, less sob today, ambulates, wants to go home, DR Paraschos recommends Fe defficiency anemia work up before Eliquis initiation. D/ w DR rein, recommends EGD on Monday, to start CLD today, Pt states she had colonoscopy 2 y ago, normal, by DR Gustavo Lah.   Subjective:   Leanda Braasch today has No headache, No chest pain, No abdominal pain - No Nausea, No new weakness tingling or numbness, No Cough , no  SOB.  Objective:   Filed Vitals:   01/09/15 1500 01/10/15 0520 01/10/15 0756 01/10/15 0813  BP:  139/58  121/73  Pulse:  80 105 70  Temp:  98.4 F (36.9 C)  98.5 F (36.9 C)  TempSrc:  Oral  Oral  Resp:  18  16  Height: 5' 5.98" (1.676 m)     Weight: 97.52 kg (214 lb 15.9 oz)     SpO2:  91% 95% 92%    Wt Readings from Last 3 Encounters:  01/09/15 97.52 kg (214 lb 15.9 oz)     Intake/Output Summary (Last 24 hours) at 01/10/15 1214 Last data filed at 01/10/15 1126  Gross per 24 hour  Intake     50 ml  Output    451 ml  Net   -401 ml    ROS:  CONSTITUTIONAL: No documented fever. No fatigue and weakness. No weight gain or weight loss.  EYES: No blurry or double vision.  EARS, NOSE, THROAT: No tinnitus. No postnasal drip. No redness of the oropharynx.  RESPIRATORY:  No cough. No wheeze. No hemoptysis. Positive dyspnea.  CARDIOVASCULAR: No chest pain. No orthopnea. No peripheral edema. No dyspnea on exertion. No palpitation or syncope.  GASTROINTESTINAL: No nausea, no vomiting, no diarrhea. No abdominal pain. No melena or hematochezia.  GENITOURINARY: No dysuria or  hematuria.  ENDOCRINE: No polyuria, nocturia, heat or cold intolerance.   HEMATOLOGIC:  No anemia, no bruising, no bleeding.  INTEGUMENTARY: No rashes. No lesions.  MUSCULOSKELETAL: No arthritis, no swelling, no gout.  NEUROLOGIC: No numbness or tingling. No ataxia. No seizure-type activity.  PSYCHIATRIC: No anxiety, no insomnia. No ADD.    Physical Exam   Gen - Awake Alert, Oriented X 3 HEENT - AT, [place],PERRL, moist oral mucosa.   Neck - Supple Neck,No JVD, No cervical lymphadenopathy appriciated.  Lung - Symmetrical Chest wall movement, Good air movement bilaterally, CTAB, few scattered crackles anteriorly.  Heart - RRR,No Gallops,Rubs or new Murmurs, No Parasternal Heave Abg - +ve B.Sounds, Abd Soft, No tenderness, No organomegaly appriciated, No rebound - guarding or rigidity. Ext - No Cyanosis, Clubbing or edema, No new Rash or bruise, indurated lower extremities , shrunken skin, brownish discoloration  Neuro - AAO X 3, no focal motor or sensory defecits b/l  Data Review   Micro Results No results found for this or any previous visit (from the past 240 hour(s)).  Radiology Reports Dg Chest 2 View  01/08/2015   CLINICAL DATA:  80 year old female with shortness of breath for 2 weeks with face and extremity swelling for 3 days. Initial encounter. Possible medication  reaction.  EXAM: CHEST  2 VIEW  COMPARISON:  12/31/2014 and earlier.  FINDINGS: Stable lung volumes. Stable cardiomegaly and mediastinal contours. Chronically increased interstitial markings, specially in than medial right upper lobe, have not significantly changed compared to 2014. No pneumothorax or effusion. No other confluent pulmonary opacity. Stable visualized osseous structures. Numerous surgical clips in the abdomen.  IMPRESSION: Stable.  No acute cardiopulmonary abnormality.   Electronically Signed   By: Genevie Ann M.D.   On: 01/08/2015 14:35    CBC  Recent Labs Lab 01/08/15 1237 01/09/15 0542  WBC 6.9 6.7  HGB  8.6* 8.4*  HCT 28.7* 26.8*  PLT 219 205  MCV 74* 73*  MCH 22.3* 22.8*  MCHC 30.1* 31.3*  RDW 16.1* 16.0*  LYMPHSABS  --  1.4  MONOABS  --  0.5  EOSABS  --  0.1  BASOSABS  --  0.0    Chemistries   Recent Labs Lab 01/08/15 1237 01/09/15 0542 01/10/15 0532  NA  --   --  138  K  --   --  4.1  CL  --   --  99*  CO2 27 30 29   GLUCOSE  --   --  134*  BUN 18 17 21*  CREATININE 1.03* 1.33* 1.25*  CALCIUM 9.1 9.2 9.0   ------------------------------------------------------------------------------------------------------------------ estimated creatinine clearance is 44.4 mL/min (by C-G formula based on Cr of 1.25). ------------------------------------------------------------------------------------------------------------------ No results for input(s): HGBA1C in the last 72 hours. ------------------------------------------------------------------------------------------------------------------ No results for input(s): CHOL, HDL, LDLCALC, TRIG, CHOLHDL, LDLDIRECT in the last 72 hours. ------------------------------------------------------------------------------------------------------------------ No results for input(s): TSH, T4TOTAL, T3FREE, THYROIDAB in the last 72 hours.  Invalid input(s): FREET3 ------------------------------------------------------------------------------------------------------------------ No results for input(s): VITAMINB12, FOLATE, FERRITIN, TIBC, IRON, RETICCTPCT in the last 72 hours.  Coagulation profile No results for input(s): INR, PROTIME in the last 168 hours.  No results for input(s): DDIMER in the last 72 hours.  Cardiac Enzymes No results for input(s): CKMB, TROPONINI, MYOGLOBIN in the last 168 hours.  Invalid input(s): CK ------------------------------------------------------------------------------------------------------------------ Invalid input(s): POCBNP       Assessment & Plan   Active Problems:   CHF (congestive heart  failure)   1. acute on chronic diastolic chf with LE swelling, mostly R sided, preserved Ef, observed echo, resume lower dose of lasix 2. malignant esssential HTN, advance metoprolol, continue norvasc, clonidine 3 a. fib rate controled, advance metoprolol, observed echo, appreciate cardiology consult, CHADS2 score 6, would benefit from anticoagulation, start eliquis after GI work up is complete 4.DM, get hgb a1c, contineu home meds, SSI 5. leg swelling, unlikely DVT, improved with lasix, follwoing with BP control 6 Fe defficiency anemia, guaic, needs Gi evaluation, d/w DR Rayann Heman, EGD likely tomorrow, CLD, had colonoscopy 2 y ago by DR Gustavo Lah    eliquis  home  Full Code         Medications  Scheduled Meds: . amLODipine  5 mg Oral Daily  . calcium-vitamin D  1 tablet Oral Q breakfast  . cloNIDine  0.1 mg Oral BID  . glipiZIDE  10 mg Oral BID AC  . insulin aspart  0-10 Units Subcutaneous TID AC & HS  . insulin detemir  20 Units Subcutaneous QHS  . metFORMIN  500 mg Oral BID WC  . metoprolol tartrate  25 mg Oral BID  . pantoprazole  40 mg Oral Daily  . pravastatin  40 mg Oral QHS   Continuous Infusions:  PRN Meds:.acetaminophen, albuterol, ondansetron (ZOFRAN) IV, sodium chloride, zolpidem   DVT Prophylaxis  Eliquis  Code Status: full  Family Communication: yes, d/w Pt's granddaughter  Today extesnively  Disposition Plan: home   Time Spent in minutes   45 min in discussion case with DR rein, graddaughter, emotional support provided to patient   Vlasta Baskin M.D on 01/10/2015 at 12:14 PM  [facility]  [phone]

## 2015-01-10 NOTE — Discharge Instructions (Signed)
Follow with DR. Amy Bender in 2-3 days     Activity: As tolerated with Full fall precautions use walker/cane & assistance as needed   Disposition Home **   Diet: Heart Healthy ** , with feeding assistance and aspiration precautions.  For Heart failure patients - Check your Weight same time everyday, if you gain over 2 pounds, or you develop in leg swelling, experience more shortness of breath or chest pain, call your Primary MD immediately. Follow Cardiac Low Salt Diet and 1.5 lit/day fluid restriction.   On your next visit with your primary care physician please Get Medicines reviewed and adjusted.   Please request your Prim.MD to go over all Hospital Tests and Procedure/Radiological results at the follow up, please get all Hospital records sent to your Prim MD by signing hospital release before you go home.   If you experience worsening of your admission symptoms, develop shortness of breath, life threatening emergency, suicidal or homicidal thoughts you must seek medical attention immediately by calling 911 or calling your MD immediately  if symptoms less severe.  You Must read complete instructions/literature along with all the possible adverse reactions/side effects for all the Medicines you take and that have been prescribed to you. Take any new Medicines after you have completely understood and accpet all the possible adverse reactions/side effects.   Do not drive, operating heavy machinery, perform activities at heights, swimming or participation in water activities or provide baby sitting services if your were admitted for syncope or siezures until you have seen by Primary MD or a Neurologist and advised to do so again.  Do not drive when taking Pain medications.    Do not take more than prescribed Pain, Sleep and Anxiety Medications  Special Instructions: If you have smoked or chewed Tobacco  in the last 2 yrs please stop smoking, stop any regular Alcohol  and or any  Recreational drug use.  Wear Seat belts while driving.   Please note  You were cared for by a hospitalist during your hospital stay. If you have any questions about your discharge medications or the care you received while you were in the hospital after you are discharged, you can call the unit and asked to speak with the hospitalist on call if the hospitalist that took care of you is not available. Once you are discharged, your primary care physician will handle any further medical issues. Please note that NO REFILLS for any discharge medications will be authorized once you are discharged, as it is imperative that you return to your primary care physician (or establish a relationship with a primary care physician if you do not have one) for your aftercare needs so that they can reassess your need for medications and monitor your lab values.

## 2015-01-10 NOTE — Discharge Summary (Signed)
Wilcox, is a 80 y.o. female  DOB 1935-03-21  MRN [id number].  Admission date:  01/08/2015  Admitting Physician  Henreitta Leber, MD  Discharge Date:  01/10/2015   Primary MD  No primary care provider on file.  Recommendations for primary care physician for things to follow:   BMP, CBC  In 2-3 days.  Patient is to be scheduled for EGD and colonoscopy by DR Rayann Heman in the next few days Patient is to be seen by DR Paraschos after GI evaluation in completed for Eliquis initiation.     Admission Diagnosis  SOB on exertion    Discharge Diagnosis  Acute on chronic diastolic  CHF, Malignant essential hypertension, a. Fib, new onset, rapid ventricular response, DM, type 2, leg swelling, Fe deficiency anemia, GI work up is indicated   Active Problems:   CHF (congestive heart failure)      No past medical history on file.  No past surgical history on file.     History of present illness and  Hospital Course:     Kindly see H&P for history of present illness and admission details, please review complete Labs, Consult reports and Test reports for all details in brief  HPI  from the history and physical done on the day of admission  The patient  came in with SOb, Leg swelling, noted to have a. fib, rate at 90-100, no palpitations, better with lasix, less swelling, less sob today Hospital Course     1. acute on chronic diastolic chf with LE swelling, mostly R sided, preserved Ef, observed echo, mild  to moderate MR, TR, normal EF 55-60-%,  Continue lower dose of lasix, following CR, BUN as outpatient.  2. malignant esssential HTN, continue metoprolol, norvasc, clonidine, much improved control 3 a. fib rate controled, continue metoprolol, observed echo, appreciate cardiology consult,  CHADS2 score 6, would benefit from anticoagulation, start eliquis after GI work up is completed, d/w Dr Rayann Heman and family as well as pt herself extensively 4.DM, getting hgb a1c, contineu home meds, SSI 5. leg swelling, unlikely DVT, improved with lasix, follwoing with BP control 6 Fe defficiency anemia, guaic, needs Gi evaluation, d/w DR Rayann Heman, EGD/colonoscopy by DR Rayann Heman in the next few days, d/w DR Rayann Heman extensively today          Discharge Condition: good   Follow UP  Follow-up Information    Follow up with Rebeca Alert, Durene Cal, MD. Schedule an appointment as soon as possible for a visit in 2 days.   Contact information:   [place] [address] [phone]       Schedule an appointment as soon as possible for a visit with REIN, Grace Blight, MD.   Specialty:  Gastroenterology   Contact information:   [facility] [place] [address] [phone]       Schedule an appointment as soon as possible for a visit with Isaias Cowman, MD.   Specialty:  Cardiology   Contact  information:   [place] [address] [phone]         Discharge Instructions  and  Discharge Medications        Medication List    TAKE these medications        albuterol 108 (90 BASE) MCG/ACT inhaler  Commonly known as:  PROVENTIL HFA;VENTOLIN HFA  Inhale 2 puffs into the lungs every 6 (six) hours as needed for wheezing or shortness of breath.     amLODipine 5 MG tablet  Commonly known as:  NORVASC  Take 1 tablet (5 mg total) by mouth daily.     CALCIUM 600 + D PO  Take 1 tablet by mouth.     cloNIDine 0.1 MG tablet  Commonly known as:  CATAPRES  Take 0.1 mg by mouth 2 (two) times daily.     esomeprazole 20 MG capsule  Commonly known as:  NEXIUM  Take 20 mg by mouth at bedtime.     glipiZIDE 10 MG 24 hr tablet  Commonly known as:  GLUCOTROL XL  Take 10 mg by mouth 2 (two) times daily.      insulin NPH Human 100 UNIT/ML injection  Commonly known as:  HUMULIN N,NOVOLIN N  Inject 35 Units into the skin at bedtime.     lovastatin 40 MG tablet  Commonly known as:  MEVACOR  Take 40 mg by mouth at bedtime.     metFORMIN 500 MG 24 hr tablet  Commonly known as:  GLUCOPHAGE-XR  Take 500 mg by mouth 2 (two) times daily.     metoprolol tartrate 25 MG tablet  Commonly known as:  LOPRESSOR  Take 1 tablet (25 mg total) by mouth 2 (two) times daily.          Diet and Activity recommendation: See Discharge Instructions above   Consults obtained - Drs Rayann Heman and Paraschos   Major procedures and Radiology Reports - PLEASE review detailed and final reports for all details, in brief -   none   Dg Chest 2 View  01/08/2015   CLINICAL DATA:  80 year old female with shortness of breath for 2 weeks with face and extremity swelling for 3 days. Initial encounter. Possible medication reaction.  EXAM: CHEST  2 VIEW  COMPARISON:  12/31/2014 and earlier.  FINDINGS: Stable lung volumes. Stable cardiomegaly and mediastinal contours. Chronically increased interstitial markings, specially in than medial right upper lobe, have not significantly changed compared to 2014. No pneumothorax or effusion. No other confluent pulmonary opacity. Stable visualized osseous structures. Numerous surgical clips in the abdomen.  IMPRESSION: Stable.  No acute cardiopulmonary abnormality.   Electronically Signed   By: Genevie Ann M.D.   On: 01/08/2015 14:35   Dg Chest 2 View  12/31/2014   CLINICAL DATA:  Shortness of breath, wheezing  EXAM: CHEST  2 VIEW  COMPARISON:  Chest x-ray of 09/04/2013  FINDINGS: Foci of linear scarring remain bilaterally primarily involving the perihilar regions and upper lobes right greater than left. No focal infiltrate or effusion is seen. Mediastinal and hilar contours are unchanged and cardiomegaly is stable. The bones are osteopenic. A compressed lower thoracic vertebral body appears  stable.  IMPRESSION: Stable pulmonary fibrosis.  No active process.  Stable cardiomegaly.   Electronically Signed   By: Ivar Drape M.D.   On: 12/31/2014 13:08    Micro Results   none  No results found for this or any previous visit (from the past 240 hour(s)).     Today  Subjective:   Dorothy [name] today has no headache,no chest abdominal pain,no new weakness tingling or numbness, feels much better wants to go home today.  Objective:   Blood pressure 140/70, pulse 78, temperature 98.1 F (36.7 C), temperature source Oral, resp. rate 18, height 5' 5.98" (1.676 m), weight 97.52 kg (214 lb 15.9 oz), SpO2 96 %.   Intake/Output Summary (Last 24 hours) at 01/10/15 1455 Last data filed at 01/10/15 1442  Gross per 24 hour  Intake    290 ml  Output    851 ml  Net   -561 ml    Exam Awake Alert, Oriented x 3, No new F.N deficits, Normal affect [place].AT,PERRAL Supple Neck,No JVD, No cervical lymphadenopathy appriciated.  Symmetrical Chest wall movement, Good air movement bilaterally, CTAB RRR,No Gallops,Rubs or new Murmurs, No Parasternal Heave +ve B.Sounds, Abd Soft, Non tender, No organomegaly appriciated, No rebound -guarding or rigidity. No Cyanosis, Clubbing or edema, No new Rash or bruise  Data Review   CBC w Diff: Lab Results  Component Value Date   WBC 6.7 01/09/2015   HGB 8.4* 01/09/2015   HCT 26.8* 01/09/2015   PLT 205 01/09/2015   LYMPHOPCT 20.4 01/09/2015   MONOPCT 7.3 01/09/2015   EOSPCT 1.6 01/09/2015   BASOPCT 0.3 01/09/2015    CMP: Lab Results  Component Value Date   NA 138 01/10/2015   NA 141 01/09/2015   K 4.1 01/10/2015   K 4.3 01/09/2015   CL 99* 01/10/2015   CL 104 01/09/2015   CO2 29 01/10/2015   CO2 30 01/09/2015   BUN 21* 01/10/2015   BUN 17 01/09/2015   CREATININE 1.25* 01/10/2015   CREATININE 1.33* 01/09/2015   PROT 7.0 12/31/2014   ALBUMIN 3.9 12/31/2014   ALKPHOS 76 12/31/2014   AST 24 12/31/2014   ALT 16 12/31/2014   .   Total Time in preparing paper work, data evaluation and todays exam - 17 minutes  Akiko Schexnider M.D on 01/10/2015 at 2:55 PM

## 2015-01-11 NOTE — Consult Note (Addendum)
PATIENT NAME:  Dorothy [name], Dorothy [name] MR#:  [id number] DATE OF BIRTH:  1935/08/17  DATE OF CONSULTATION:  01/09/2015  REFERRING PHYSICIAN:   CONSULTING PHYSICIAN:  Isaias Cowman, MD  PRIMARY CARE PHYSICIAN: Abby D. Rebeca Alert MD,  [facility]  CHIEF COMPLAINT: Swelling and shortness of breath.   REASON FOR CONSULTATION: Consultation requested for evaluation of diastolic congestive heart failure and atrial fibrillation.   HISTORY OF PRESENT ILLNESS: The patient is a 80 year old female with a history of hypertension, diabetes, and obesity, who presents with a 2-week history of worsening peripheral edema and shortness of breath. The patient reports that during the past 2 weeks her shortness of breath has progressed to the point where she is unable to lay flat and sleep, requiring 2 pillows be propped. The patient denies chest pain. She presented to [facility] Emergency Room where she was noted to have peripheral edema and hypoxia. The patient was treated with intravenous furosemide with diuresis and overall clinical improvement. The patient denied chest pain. Admission labs were notable for a troponin less than 0.03 with hemoglobin and hematocrit of 8.6 and 28.7 respectively. The patient reports a history of peptic ulcer disease but has not noted any evidence for GI bleed. In the Emergency Room, EKG revealed atrial fibrillation with a controlled ventricular rate. Upon questioning, the patient may have had a history of chronic atrial fibrillation dating back to at least 2 years. She denies palpitations.   PAST MEDICAL HISTORY:  1. Hypertension.  2. Hyperlipidemia.  3. Diabetes.  4. Obesity.  5. Chronic atrial fibrillation.   MEDICATIONS: Lovastatin 40 mg daily, albuterol inhaler 2 puffs q.i.d. p.r.n., clonidine 0.1 mg b.i.d., glipizide XL 10 mg b.i.d., metformin 500 mg b.i.d., Nexium 20 mg daily, NovoLog N 35 units at bedtime.   SOCIAL HISTORY: The patient currently lives alone. She denies tobacco or  EtOH abuse.   FAMILY HISTORY: No immediate family history of coronary artery disease or myocardial infarction.  REVIEW OF SYSTEMS: .  CONSTITUTIONAL: No fever or chills.  EYES: No blurry vision.  EARS: No hearing loss.  RESPIRATORY: Shortness of breath as described above.  CARDIOVASCULAR: No chest pain.  GASTROINTESTINAL: No nausea or vomiting.  GENITOURINARY: No dysuria or hematuria.  ENDOCRINE: No polyuria or polydipsia.  MUSCULOSKELETAL: No arthralgias or myalgias.  NEUROLOGICAL: No focal muscle weakness or numbness.  PSYCHOLOGICAL: No depression or anxiety.   PHYSICAL EXAMINATION:  VITAL SIGNS: Blood pressure 181/82, pulse 86, respirations 97.8, pulse oximetry 93%.  HEENT: Pupils equal, reactive to light and accommodation.  NECK: Supple without thyromegaly.  LUNGS: Clear.  CARDIOVASCULAR: Normal JVP. Normal PMI. Irregularly irregular rhythm. Normal S1, S2. No appreciable gallop, murmur, or rub.  ABDOMEN: Soft and nontender. Pulses were intact bilaterally.  MUSCULOSKELETAL: Normal muscle tone.  NEUROLOGICAL: The patient is alert and oriented x3. Motor and sensory are both grossly intact.   IMPRESSION: A 80 year old female who presents with peripheral edema and shortness of breath consistent with diastolic congestive heart failure with negative troponin or ischemic ECG changes. The patient has experienced significant clinical improvement after initial diuresis. Systolic blood pressure is elevated. The patient started on metoprolol tartrate and amlodipine. The patient has atrial fibrillation with a controlled ventricular rate of unknown duration, possibly longer than 2 years. The patient has a CHADS2-VASc score of 6 and would normally be a candidate for chronic anticoagulation. Of some concern, the patient has had a history of peptic ulcer disease and presents with anemia. I discussed in detail the risks, benefits, and  alternatives of chronic anticoagulation with the patient. The patient  has been started on apixaban 5 mg b.i.d.   RECOMMENDATIONS:  1. Agree with overall current therapy.  2. Consider a GI evaluation in terms of the patient's risk for potential GI bleed after starting apixaban 5 mg b.i.d.  3. Review 2-D echocardiogram.  4. Continue diuresis.  5. Further recommendations pending echocardiographic results.   ____________________________ Isaias Cowman, MD ap:jh D: 01/09/2015 13:43:11 ET T: 01/09/2015 16:10:10 ET JOB#: [id number]  cc: Isaias Cowman, MD, <Dictator>

## 2015-01-19 ENCOUNTER — Encounter
Admission: RE | Disposition: A | Discharge status: Home / Self Care | Payer: Self-pay | Source: Ambulatory Visit | Attending: Gastroenterology

## 2015-01-19 ENCOUNTER — Ambulatory Visit: Payer: Medicare Other | Admitting: Anesthesiology

## 2015-01-19 ENCOUNTER — Encounter: Payer: Self-pay | Admitting: *Deleted

## 2015-01-19 ENCOUNTER — Ambulatory Visit
Admission: RE | Admit: 2015-01-19 | Discharge: 2015-01-19 | Disposition: A | Discharge status: Home / Self Care | Payer: Medicare Other | Source: Ambulatory Visit | Attending: Gastroenterology | Admitting: Gastroenterology

## 2015-01-19 DIAGNOSIS — E119 Type 2 diabetes mellitus without complications: Secondary | ICD-10-CM | POA: Insufficient documentation

## 2015-01-19 DIAGNOSIS — I509 Heart failure, unspecified: Secondary | ICD-10-CM | POA: Insufficient documentation

## 2015-01-19 DIAGNOSIS — I10 Essential (primary) hypertension: Secondary | ICD-10-CM | POA: Insufficient documentation

## 2015-01-19 DIAGNOSIS — D509 Iron deficiency anemia, unspecified: Secondary | ICD-10-CM | POA: Insufficient documentation

## 2015-01-19 DIAGNOSIS — J449 Chronic obstructive pulmonary disease, unspecified: Secondary | ICD-10-CM | POA: Insufficient documentation

## 2015-01-19 DIAGNOSIS — Z7901 Long term (current) use of anticoagulants: Secondary | ICD-10-CM | POA: Insufficient documentation

## 2015-01-19 DIAGNOSIS — Z794 Long term (current) use of insulin: Secondary | ICD-10-CM | POA: Diagnosis not present

## 2015-01-19 DIAGNOSIS — K5730 Diverticulosis of large intestine without perforation or abscess without bleeding: Secondary | ICD-10-CM | POA: Insufficient documentation

## 2015-01-19 DIAGNOSIS — K644 Residual hemorrhoidal skin tags: Secondary | ICD-10-CM | POA: Diagnosis not present

## 2015-01-19 DIAGNOSIS — Z9049 Acquired absence of other specified parts of digestive tract: Secondary | ICD-10-CM | POA: Diagnosis not present

## 2015-01-19 DIAGNOSIS — Z79899 Other long term (current) drug therapy: Secondary | ICD-10-CM | POA: Insufficient documentation

## 2015-01-19 DIAGNOSIS — Z931 Gastrostomy status: Secondary | ICD-10-CM | POA: Insufficient documentation

## 2015-01-19 DIAGNOSIS — Z9889 Other specified postprocedural states: Secondary | ICD-10-CM | POA: Diagnosis not present

## 2015-01-19 DIAGNOSIS — E785 Hyperlipidemia, unspecified: Secondary | ICD-10-CM | POA: Insufficient documentation

## 2015-01-19 DIAGNOSIS — D123 Benign neoplasm of transverse colon: Secondary | ICD-10-CM | POA: Diagnosis not present

## 2015-01-19 HISTORY — DX: Type 2 diabetes mellitus without complications: E11.9

## 2015-01-19 HISTORY — DX: Essential (primary) hypertension: I10

## 2015-01-19 HISTORY — DX: Nausea with vomiting, unspecified: R11.2

## 2015-01-19 HISTORY — DX: Anemia, unspecified: D64.9

## 2015-01-19 HISTORY — PX: COLONOSCOPY: SHX5424

## 2015-01-19 HISTORY — DX: Chronic obstructive pulmonary disease, unspecified: J44.9

## 2015-01-19 HISTORY — DX: Other specified postprocedural states: Z98.890

## 2015-01-19 HISTORY — PX: ESOPHAGOGASTRODUODENOSCOPY: SHX5428

## 2015-01-19 HISTORY — DX: Hyperlipidemia, unspecified: E78.5

## 2015-01-19 HISTORY — DX: Heart failure, unspecified: I50.9

## 2015-01-19 LAB — GLUCOSE, CAPILLARY: Glucose-Capillary: 171 mg/dL — ABNORMAL HIGH (ref 70–99)

## 2015-01-19 SURGERY — COLONOSCOPY
Anesthesia: General

## 2015-01-19 MED ORDER — LIDOCAINE HCL (CARDIAC) 20 MG/ML IV SOLN
INTRAVENOUS | Status: DC | PRN
  Administered 2015-01-19: 100 mg via INTRAVENOUS

## 2015-01-19 MED ORDER — PROPOFOL INFUSION 10 MG/ML OPTIME
INTRAVENOUS | Status: DC | PRN
Start: 2015-01-19 — End: 2015-01-19
  Administered 2015-01-19: 100 ug/kg/min via INTRAVENOUS

## 2015-01-19 MED ORDER — ALFENTANIL 500 MCG/ML IJ INJ
INJECTION | INTRAMUSCULAR | Status: DC | PRN
  Administered 2015-01-19: 500 ug via INTRAVENOUS

## 2015-01-19 MED ORDER — FENTANYL CITRATE (PF) 100 MCG/2ML IJ SOLN
INTRAMUSCULAR | Status: DC | PRN
  Administered 2015-01-19: 50 ug via INTRAVENOUS

## 2015-01-19 MED ORDER — LACTATED RINGERS IV SOLN: INTRAVENOUS | Status: DC

## 2015-01-19 MED ORDER — PHENYLEPHRINE HCL 10 MG/ML IJ SOLN
INTRAMUSCULAR | Status: DC | PRN
  Administered 2015-01-19 (×2): 100 ug via INTRAVENOUS

## 2015-01-19 MED ORDER — SODIUM CHLORIDE 0.9 % IV SOLN
INTRAVENOUS | Status: DC
  Administered 2015-01-19 (×2): via INTRAVENOUS

## 2015-01-19 MED ORDER — PROPOFOL 10 MG/ML IV BOLUS
INTRAVENOUS | Status: DC | PRN
  Administered 2015-01-19 (×2): 30 mg via INTRAVENOUS

## 2015-01-19 NOTE — Op Note (Signed)
[facility] Patient Name: Dorothy [name] Procedure Date: 01/19/2015 10:46 AM MRN: [id number] Account #: [id number] Date of Birth: Jul 06, 1935 Admit Type: Outpatient Age: 80 Room: [facility] ENDO ROOM 1 Gender: Female Note Status: Finalized Procedure:         Colonoscopy Indications:       Iron deficiency anemia Patient Profile:   This is a 80 year old female. Providers:         Gerrit Heck. Rayann Heman, MD Referring MD:      Dr Juanda Crumble [facility], MD (Referring MD), Isaias Cowman, MD (Referring MD) Medicines:         Propofol per Anesthesia Complications:     No immediate complications. Procedure:         Pre-Anesthesia Assessment:                    - See the other procedure note for documentation of the                     pre-procedure assessment.                    After obtaining informed consent, the colonoscope was                     passed under direct vision. Throughout the procedure, the                     patient's blood pressure, pulse, and oxygen saturations                     were monitored continuously. The Colonoscope was                     introduced through the anus and advanced to the the                     terminal ileum. The colonoscopy was performed without                     difficulty. The patient tolerated the procedure well. The                     quality of the bowel preparation was good. Findings:      The perianal exam findings include non-thrombosed external hemorrhoids.      Multiple small and large-mouthed diverticula were found in the sigmoid       colon, in the descending colon, in the transverse colon and in the       ascending colon.      A 4 mm polyp was found in the transverse colon. The polyp was sessile.       The polyp was removed with a cold snare. Resection and retrieval were       complete.      The exam was otherwise without abnormality on direct and retroflexion       views.  The terminal ileum appeared normal. Impression:        - Non-thrombosed external hemorrhoids found on perianal                     exam.                    -  Diverticulosis in the sigmoid colon, in the descending                     colon, in the transverse colon and in the ascending colon.                    - One 4 mm polyp in the transverse colon. Resected and                     retrieved.                    - The examination was otherwise normal on direct and                     retroflexion views.                    - The examined portion of the ileum was normal. Recommendation:    - Observe patient in GI recovery unit.                    - High fiber diet.                    - Continue present medications.                    - Await pathology results.                    - No further preventitive colonoscopies based on age and                     comorbidities.                    - Restart anti-coagulation today and perform the capsule                     endoscopy on anti-coag.                    - Return to referring physician.                    - The findings and recommendations were discussed with the                     patient.                    - The findings and recommendations were discussed with the                     patient's family.                    - To visualize the small bowel, perform video capsule                     endoscopy at appointment to be scheduled. Procedure Code(s): --- Professional ---                    [id number], Colonoscopy, flexible; with removal of tumor(s),                     polyp(s), or other lesion(s) by snare technique CPT copyright 2014 American Medical Association. All rights reserved. The codes documented in this report are preliminary and upon  coder review may  be revised to meet current compliance requirements. Mellody Life, MD 01/19/2015 11:43:06 AM This report has been signed electronically. Number of Addenda: 0 Note Initiated  On: 01/19/2015 10:46 AM Scope Withdrawal Time: 0 hours 12 minutes 5 seconds  Total Procedure Duration: 0 hours 18 minutes 33 seconds       [facility]

## 2015-01-19 NOTE — H&P (Signed)
Primary Care Physician:  Letta Median, MD  Pre-Procedure History & Physical: HPI:  Dorothy [name] is a 80 y.o. female is here for an colonoscopy / EGD for IDA   Past Medical History  Diagnosis Date  . PONV (postoperative nausea and vomiting)   . Diabetes mellitus without complication   . Hypertension   . CHF (congestive heart failure)   . COPD (chronic obstructive pulmonary disease)   . Anemia   . Hyperlipidemia     Past Surgical History  Procedure Laterality Date  . Abdominal hysterectomy    . Shoulder arthroscopy    . Cholecystectomy      Prior to Admission medications   Medication Sig Start Date End Date Taking? Authorizing Provider  acetaminophen (TYLENOL) 500 MG tablet Take 1,000 mg by mouth every 6 (six) hours as needed for moderate pain or headache.   Yes Historical Provider, MD  albuterol (PROVENTIL HFA;VENTOLIN HFA) 108 (90 BASE) MCG/ACT inhaler Inhale 2 puffs into the lungs every 6 (six) hours as needed for wheezing or shortness of breath.   Yes Historical Provider, MD  amLODipine (NORVASC) 5 MG tablet Take 1 tablet (5 mg total) by mouth daily. 01/10/15  Yes Theodoro Grist, MD  Calcium Carb-Cholecalciferol (CALCIUM 600 + D PO) Take 1 tablet by mouth.   Yes Historical Provider, MD  cloNIDine (CATAPRES) 0.1 MG tablet Take 0.1 mg by mouth 2 (two) times daily.   Yes Historical Provider, MD  esomeprazole (NEXIUM) 20 MG capsule Take 20 mg by mouth at bedtime.   Yes Historical Provider, MD  glipiZIDE (GLUCOTROL XL) 10 MG 24 hr tablet Take 10 mg by mouth 2 (two) times daily.   Yes Historical Provider, MD  insulin NPH Human (HUMULIN N,NOVOLIN N) 100 UNIT/ML injection Inject 50 Units into the skin at bedtime.    Yes Historical Provider, MD  lovastatin (MEVACOR) 40 MG tablet Take 40 mg by mouth at bedtime.   Yes Historical Provider, MD  metFORMIN (GLUMETZA) 1000 MG (MOD) 24 hr tablet Take 1,000 mg by mouth 2 (two) times daily.   Yes Historical Provider, MD  metoprolol  tartrate (LOPRESSOR) 25 MG tablet Take 1 tablet (25 mg total) by mouth 2 (two) times daily. 01/10/15  Yes Theodoro Grist, MD    Allergies as of 01/11/2015  . (No Known Allergies)    History reviewed. No pertinent family history.  History   Social History  . Marital Status: Widowed    Spouse Name: N/A  . Number of Children: N/A  . Years of Education: N/A   Occupational History  . Not on file.   Social History Main Topics  . Smoking status: Never Smoker   . Smokeless tobacco: Not on file  . Alcohol Use: No  . Drug Use: Not on file  . Sexual Activity: Not on file   Other Topics Concern  . Not on file   Social History Narrative  . No narrative on file     Physical Exam: BP 159/60 mmHg  Pulse 75  Temp(Src) 97.5 F (36.4 C) (Tympanic)  Resp 16  Ht 5\' 6"  (1.676 m)  Wt 204 lb (92.534 kg)  BMI 32.94 kg/m2  SpO2 99%  LMP  General:   Alert,  pleasant and cooperative in NAD Head:  Normocephalic and atraumatic. Neck:  Supple; no masses or thyromegaly. Lungs:  Clear throughout to auscultation.    Heart:  Regular rate and rhythm. Abdomen:  Soft, nontender and nondistended. Normal bowel sounds, without guarding, and without  rebound.   Neurologic:  Alert and  oriented x4;  grossly normal neurologically.  Impression/Plan: Dorothy [name] is here for an colonoscopy / EGD to be performed for IDA.  Risks, benefits, limitations, and alternatives regarding  Colonoscopy / EGD have been reviewed with the patient.  Questions have been answered.  All parties agreeable.   Josefine Class, MD  01/19/2015, 10:46 AM

## 2015-01-19 NOTE — Transfer of Care (Signed)
Immediate Anesthesia Transfer of Care Note  Patient: Dorothy [name]  Procedure(s) Performed: Procedure(s): COLONOSCOPY (N/A) ESOPHAGOGASTRODUODENOSCOPY (EGD) (N/A)  Patient Location: PACU  Anesthesia Type:General  Level of Consciousness: awake  Airway & Oxygen Therapy: Patient Spontanous Breathing and Patient connected to nasal cannula oxygen  Post-op Assessment: Report given to RN and Post -op Vital signs reviewed and stable  Post vital signs: Reviewed and stable  Last Vitals:  Filed Vitals:   01/19/15 1146  BP: 114/63  Pulse:   Temp: 35.9 C  Resp:     Complications: No apparent anesthesia complications

## 2015-01-19 NOTE — Anesthesia Postprocedure Evaluation (Signed)
  Anesthesia Post-op Note  Patient: Dorothy [name]  Procedure(s) Performed: Procedure(s): COLONOSCOPY (N/A) ESOPHAGOGASTRODUODENOSCOPY (EGD) (N/A)  Anesthesia type:General  Patient location: PACU  Post pain: Pain level controlled  Post assessment: Post-op Vital signs reviewed, Patient's Cardiovascular Status Stable, Respiratory Function Stable, Patent Airway and No signs of Nausea or vomiting  Post vital signs: Reviewed and stable  Last Vitals:  Filed Vitals:   01/19/15 1210  BP: 147/71  Pulse: 68  Temp:   Resp: 16    Level of consciousness: awake, alert  and patient cooperative  Complications: No apparent anesthesia complications

## 2015-01-19 NOTE — Anesthesia Preprocedure Evaluation (Addendum)
Anesthesia Evaluation  Patient identified by MRN, date of birth, ID band Patient awake    Reviewed: Allergy & Precautions, H&P , NPO status , Patient's Chart, lab work & pertinent test results, reviewed documented beta blocker date and time   History of Anesthesia Complications (+) history of anesthetic complications  Airway Mallampati: II  TM Distance: >3 FB Neck ROM: full    Dental no notable dental hx. (+) Edentulous Upper, Edentulous Lower   Pulmonary neg pulmonary ROS,  breath sounds clear to auscultation  Pulmonary exam normal       Cardiovascular Exercise Tolerance: Good hypertension, +CHF negative cardio ROS  Rhythm:regular Rate:Normal     Neuro/Psych negative neurological ROS  negative psych ROS   GI/Hepatic negative GI ROS, Neg liver ROS,   Endo/Other  negative endocrine ROSdiabetes  Renal/GU negative Renal ROS  negative genitourinary   Musculoskeletal   Abdominal   Peds  Hematology negative hematology ROS (+) anemia ,   Anesthesia Other Findings   Reproductive/Obstetrics negative OB ROS                            Anesthesia Physical Anesthesia Plan  ASA: III  Anesthesia Plan: General   Post-op Pain Management:    Induction: Intravenous  Airway Management Planned:   Additional Equipment:   Intra-op Plan:   Post-operative Plan:   Informed Consent: I have reviewed the patients History and Physical, chart, labs and discussed the procedure including the risks, benefits and alternatives for the proposed anesthesia with the patient or authorized representative who has indicated his/her understanding and acceptance.   Dental Advisory Given  Plan Discussed with: CRNA  Anesthesia Plan Comments:        Anesthesia Quick Evaluation

## 2015-01-19 NOTE — Op Note (Signed)
[facility] Patient Name: Dorothy [name] Procedure Date: 01/19/2015 10:47 AM MRN: [id number] Account #: [id number] Date of Birth: Jul 18, 1935 Admit Type: Outpatient Age: 80 Room: [facility] ENDO ROOM 1 Gender: Female Note Status: Finalized Procedure:         Upper GI endoscopy Indications:       Iron deficiency anemia Patient Profile:   This is a 80 year old female. Providers:         Gerrit Heck. Rayann Heman, MD Referring MD:      [facility] (Referring MD), Isaias Cowman, MD (Referring MD) Medicines:         Propofol per Anesthesia Complications:     No immediate complications. Procedure:         Pre-Anesthesia Assessment:                    - Prior to the procedure, a History and Physical was                     performed, and patient medications and allergies were                     reviewed. The patient is competent. The risks and benefits                     of the procedure and the sedation options and risks were                     discussed with the patient. All questions were answered                     and informed consent was obtained. Patient identification                     and proposed procedure were verified by the physician and                     the nurse in the pre-procedure area. Mental Status                     Examination: alert and oriented. Airway Examination:                     normal oropharyngeal airway and neck mobility. Respiratory                     Examination: clear to auscultation. CV Examination: RRR,                     no murmurs, no S3 or S4. Prophylactic Antibiotics: The                     patient does not require prophylactic antibiotics. Prior                     Anticoagulants: The patient has taken Eliquis (apixaban),                     last dose was 6 days prior to procedure. ASA Grade                     Assessment: III - A patient with  severe systemic disease.                   After reviewing the risks and benefits, the patient was                     deemed in satisfactory condition to undergo the procedure.                     The anesthesia plan was to use monitored anesthesia care                     (MAC). Immediately prior to administration of medications,                     the patient was re-assessed for adequacy to receive                     sedatives. The heart rate, respiratory rate, oxygen                     saturations, blood pressure, adequacy of pulmonary                     ventilation, and response to care were monitored                     throughout the procedure. The physical status of the                     patient was re-assessed after the procedure.                    - Prior to the procedure, a History and Physical was                     performed, and patient medications, allergies and                     sensitivities were reviewed. The patient's tolerance of                     previous anesthesia was reviewed.                    After obtaining informed consent, the endoscope was passed                     under direct vision. Throughout the procedure, the                     patient's blood pressure, pulse, and oxygen saturations                     were monitored continuously. The Olympus GIF-160 endoscope                     (S#. [id number]) was introduced through the mouth, and                     advanced to the jejunum. The upper GI endoscopy was                     accomplished without difficulty. The patient tolerated the                     procedure well. Findings:  The esophagus was normal.      Evidence of a patent GD anastamosis ? Billroth I gastroduodenostomy was       found. A gastric pouch was found. The gastroduodenal anastomosis was       characterized by healthy appearing mucosa. This was traversed.      The 2nd part of the duodenum was normal.      The examined jejunum was normal. Biopsies  were taken with a cold forceps       for histology. Biopsies were taken with a cold forceps for histology. Impression:        - Normal esophagus.                    - Patent Billroth I gastroduodenostomy was found,                     characterized by healthy appearing mucosa.                    - Normal examined jejunum. Biopsied.                    - No source of IDA sen. Recommendation:    - Perform a colonoscopy today.                    - Await pathology results. Procedure Code(s): --- Professional ---                    [phone], Esophagogastroduodenoscopy, flexible, transoral;                     with biopsy, single or multiple CPT copyright 2014 American Medical Association. All rights reserved. The codes documented in this report are preliminary and upon coder review may  be revised to meet current compliance requirements. Mellody Life, MD 01/19/2015 11:16:40 AM This report has been signed electronically. Number of Addenda: 0 Note Initiated On: 01/19/2015 10:47 AM      [facility]

## 2015-01-20 ENCOUNTER — Encounter: Payer: Self-pay | Admitting: Gastroenterology

## 2015-01-21 LAB — SURGICAL PATHOLOGY

## 2015-03-03 ENCOUNTER — Inpatient Hospital Stay: Payer: Medicare Other | Attending: Internal Medicine | Admitting: Internal Medicine

## 2015-03-03 VITALS — BP 152/80 | HR 81 | Temp 96.4°F | Resp 18 | Ht 66.0 in | Wt 202.2 lb

## 2015-03-03 DIAGNOSIS — D509 Iron deficiency anemia, unspecified: Secondary | ICD-10-CM | POA: Insufficient documentation

## 2015-03-03 DIAGNOSIS — I509 Heart failure, unspecified: Secondary | ICD-10-CM | POA: Diagnosis not present

## 2015-03-03 DIAGNOSIS — E119 Type 2 diabetes mellitus without complications: Secondary | ICD-10-CM | POA: Diagnosis not present

## 2015-03-03 DIAGNOSIS — I10 Essential (primary) hypertension: Secondary | ICD-10-CM | POA: Diagnosis not present

## 2015-03-03 DIAGNOSIS — J449 Chronic obstructive pulmonary disease, unspecified: Secondary | ICD-10-CM | POA: Diagnosis not present

## 2015-03-03 DIAGNOSIS — Z79899 Other long term (current) drug therapy: Secondary | ICD-10-CM | POA: Insufficient documentation

## 2015-03-03 DIAGNOSIS — R5383 Other fatigue: Secondary | ICD-10-CM | POA: Insufficient documentation

## 2015-03-03 LAB — FOLATE: FOLATE: 14.6 ng/mL (ref >5.9)

## 2015-03-03 LAB — IRON AND TIBC
Iron: 18 ug/dL — ABNORMAL LOW (ref 28–170)
Saturation Ratios: 3 % — ABNORMAL LOW (ref 10.4–31.8)
TIBC: 529 ug/dL — AB (ref 250–450)
UIBC: 511 ug/dL

## 2015-03-03 LAB — CREATININE, SERUM
Creatinine, Ser: 1.04 mg/dL — ABNORMAL HIGH (ref 0.44–1.00)
GFR calc non Af Amer: 50 mL/min — ABNORMAL LOW (ref >60)
GFR, EST AFRICAN AMERICAN: 59 mL/min — AB (ref >60)

## 2015-03-03 LAB — VITAMIN B12: Vitamin B-12: 159 pg/mL — ABNORMAL LOW (ref 180–914)

## 2015-03-03 LAB — DAT, POLYSPECIFIC AHG (ARMC ONLY): Polyspecific AHG test: NEGATIVE

## 2015-03-03 LAB — FERRITIN: Ferritin: 6 ng/mL — ABNORMAL LOW (ref 11–307)

## 2015-03-03 NOTE — Progress Notes (Signed)
Patient states that she has been feeling tired and fatigued more than normal. She states that she has been on oral iron once a day for about a month. Her cardiologist wants to start her on blood thinners, but can not until we find out what is causing the anemia.

## 2015-03-05 LAB — CBC WITH DIFFERENTIAL/PLATELET
BASOS ABS: 0 10*3/uL (ref 0–0.1)
Basophils Relative: 1 %
Eosinophils Absolute: 0.2 10*3/uL (ref 0–0.7)
HEMATOCRIT: 32.8 % — AB (ref 35.0–47.0)
Hemoglobin: 9.8 g/dL — ABNORMAL LOW (ref 12.0–16.0)
LYMPHS ABS: 1.5 10*3/uL (ref 1.0–3.6)
MCH: 21.8 pg — AB (ref 26.0–34.0)
MCHC: 30 g/dL — AB (ref 32.0–36.0)
MCV: 72.8 fL — AB (ref 80.0–100.0)
MONO ABS: 0.4 10*3/uL (ref 0.2–0.9)
Monocytes Relative: 6 %
Neutro Abs: 5.1 10*3/uL (ref 1.4–6.5)
Neutrophils Relative %: 70 %
Platelets: 235 10*3/uL (ref 150–440)
RBC: 4.5 MIL/uL (ref 3.80–5.20)
RDW: 16.9 % — ABNORMAL HIGH (ref 11.5–14.5)
WBC: 7.3 10*3/uL (ref 3.6–11.0)

## 2015-03-08 LAB — PROTEIN ELECTROPHORESIS, SERUM
A/G RATIO SPE: 1.1 (ref 0.7–1.7)
ALBUMIN ELP: 3.7 g/dL (ref 2.9–4.4)
Alpha-1-Globulin: 0.3 g/dL (ref 0.0–0.4)
Alpha-2-Globulin: 0.8 g/dL (ref 0.4–1.0)
Beta Globulin: 1.2 g/dL (ref 0.7–1.3)
GLOBULIN, TOTAL: 3.3 g/dL (ref 2.2–3.9)
Gamma Globulin: 1.1 g/dL (ref 0.4–1.8)
TOTAL PROTEIN ELP: 7 g/dL (ref 6.0–8.5)

## 2015-03-16 NOTE — Progress Notes (Signed)
[place]  [phone] Fax:(336) [phone]     ID: JERISHA Dorothy [name] OB: [birth year]  MR#: [id number]  [id number]  Patient Care Team: Letta Median, MD as PCP - General  CHIEF COMPLAINT/DIAGNOSIS:  Persistent Anemia. History of iron deficiency.  (labs done on 02/04/15 which showed hemoglobin low at 9.3, MCV low at 77.6, platelets 247, WBC 7700 with unremarkable differential (ANC 5200, ALC 1690, AMC 580).  -  patient referred for Hematology evaluation and management.  HISTORY OF PRESENT ILLNESS:  Dorothy [name] is a 80 year old female patient with past medical history as described below has been referred here by GI Dr. Lamonte Richer for iron deficiency anemia. Patient had labs done on 02/04/15 which showed hemoglobin low at 9.3, MCV low at 77.6, platelets 247, WBC 7700 with unremarkable differential (ANC 5200, ALC 1690, AMC 580). Patient states that she was hospitalized in April 29 with reportedly positive stool Hemoccults aren't anemia, she then had colonoscopy and EGD. She is currently on oral iron one tablet daily for at least a month now and denies any side effects from this including constipation or other GI upset. States that she continues to have fatigue on exertion, she has mild dyspnea on exertion which is chronic. Denies any shortness of breath at rest, angina, palpitation, dizziness, orthopnea or PND. Denies new leg swelling. She also had Capsule Endoscopy on 02/17/15 which reported normal small bowel study.   REVIEW OF SYSTEMS:   ROS CONSTITUTIONAL: As in HPI above. No chills, fever or sweats.    ENT:  No headache, dizziness or epistaxis. No ear or jaw pain. Intermittent sinus drainage RESPIRATORY:   No cough. Chronic dyspnea on exertion. No wheezing. No hemoptysis. CARDIAC:  No palpitations.  No retrosternal chest pain. No orthopnea, PND. GI:  Intermittent indigestion, history of ulcers. No abdominal pain, nausea or vomiting. No diarrhea. History of  hemorrhoids.   GU:  No dysuria or hematuria.  SKIN: No rashes or pruritus. HEMATOLOGIC: denies bleeding symptoms MUSCULOSKELETAL: Chronic arthritis. No new bone pains.  EXTREMITY:  No new swelling or pain.  NEURO:  No focal weakness. No numbness or tingling of extremities.  No seizures.   ENDOCRINE:  No polyuria or polydipsia.   PAST MEDICAL HISTORY: Reviewed. Past Medical History  Diagnosis Date  . PONV (postoperative nausea and vomiting)   . Diabetes mellitus without complication   . Hypertension   . CHF (congestive heart failure)   . COPD (chronic obstructive pulmonary disease)   . Anemia   . Hyperlipidemia     PAST SURGICAL HISTORY: Reviewed. Past Surgical History  Procedure Laterality Date  . Abdominal hysterectomy    . Shoulder arthroscopy    . Cholecystectomy    . Colonoscopy N/A 01/19/2015    Procedure: COLONOSCOPY;  Surgeon: Josefine Class, MD;  Location: [facility] ENDOSCOPY;  Service: Endoscopy;  Laterality: N/A;  . Esophagogastroduodenoscopy N/A 01/19/2015    Procedure: ESOPHAGOGASTRODUODENOSCOPY (EGD);  Surgeon: Josefine Class, MD;  Location: [facility] ENDOSCOPY;  Service: Endoscopy;  Laterality: N/A;    FAMILY HISTORY: Reviewed. Remarkable for heart disease, diabetes, mother had blood clots. Also one brother had colon cancer, one brother had stomach cancer, father had lung cancer.  ADVANCED DIRECTIVES:  <no information>  SOCIAL HISTORY: Reviewed. History  Substance Use Topics  . Smoking status: Never Smoker   . Smokeless tobacco: Not on file  . Alcohol Use: No    No Known Allergies  Current Outpatient Prescriptions  Medication Sig Dispense Refill  . acetaminophen (TYLENOL)  500 MG tablet Take 1,000 mg by mouth every 6 (six) hours as needed for moderate pain or headache.    . albuterol (PROVENTIL HFA;VENTOLIN HFA) 108 (90 BASE) MCG/ACT inhaler Inhale 2 puffs into the lungs every 6 (six) hours as needed for wheezing or shortness of breath.    Marland Kitchen amLODipine  (NORVASC) 5 MG tablet Take 1 tablet (5 mg total) by mouth daily. 30 tablet 1  . Calcium Carb-Cholecalciferol (CALCIUM 600 + D PO) Take 1 tablet by mouth.    . cloNIDine (CATAPRES) 0.1 MG tablet Take 0.1 mg by mouth 2 (two) times daily.    Marland Kitchen esomeprazole (NEXIUM) 20 MG capsule Take 20 mg by mouth at bedtime.    . ferrous sulfate 325 (65 FE) MG tablet Take 325 mg by mouth daily with breakfast.    . glipiZIDE (GLUCOTROL XL) 10 MG 24 hr tablet Take 10 mg by mouth 2 (two) times daily.    . insulin NPH Human (HUMULIN N,NOVOLIN N) 100 UNIT/ML injection Inject 50 Units into the skin at bedtime.     . lovastatin (MEVACOR) 40 MG tablet Take 40 mg by mouth at bedtime.    . metFORMIN (GLUMETZA) 1000 MG (MOD) 24 hr tablet Take 1,000 mg by mouth 2 (two) times daily.    . metoprolol tartrate (LOPRESSOR) 25 MG tablet Take 1 tablet (25 mg total) by mouth 2 (two) times daily. 60 tablet 1   No current facility-administered medications for this visit.    PHYSICAL EXAM: Filed Vitals:   03/03/15 1609  BP: 152/80  Pulse: 81  Temp: 96.4 F (35.8 C)  Resp: 18     Body mass index is 32.65 kg/(m^2).      GENERAL: Patient is alert and oriented and in no acute distress. There is no icterus. Pallor present. HEENT: EOMs intact. No cervical lymphadenopathy. CVS: S1S2, regular LUNGS: Bilaterally clear to auscultation, no rhonchi. ABDOMEN: Soft, nontender. No hepatosplenomegaly clinically.  NEURO: grossly nonfocal, cranial nerves are intact. Gait unremarkable. EXTREMITIES: No pedal edema. SKIN: No rashes of major bruising MUSCULOSKELETAL: No obvious joint redness or swelling   LAB RESULTS: 02/04/15 - hemoglobin low at 9.3, MCV low at 77.6, platelets 247, WBC 7700 with unremarkable differential, ANC 5200, ALC 1690, AMC 580.  STUDIES: May 2016 -   Colonoscopy -  EGD - reported as normal esophagus. Patent Billroth I gastroduodenostomy was found, characterized by healthy appearing mucosal. Normal exam and  jejunum. Biopsied. No source of IDA sen.   02/17/15 - Capsule Endoscopy reported normal small bowel study.     ASSESSMENT / PLAN:   Persistent Anemia. History of iron deficiency  (labs done on 02/04/15 which showed hemoglobin low at 9.3, MCV low at 77.6, platelets 247, WBC 7700 with unremarkable differential (ANC 5200, ALC 1690, AMC 580)  -  patient referred for Hematology evaluation and management. I have reviewed records and labs sent by referring physician and discussed in detail with patient. Most likely explanation is ongoing iron deficiency but will also need to evaluate for other possible etiologies of anemia. Accordingly will get labs today including CBC with differential, reticulocyte count, B12 and folate level, repeat iron studies including ferritin/iron/TIBC, DAT test, serum creatinine. If this turns out to be iron deficiency anemia only, have discussed further options with patient including maximizing dose of oral iron therapy if she can tolerate versus pursuing a course of parenteral iron therapy with IV Venofer (iron sucrose). Patient states that she would prefer giving trial of oral iron therapy  since she is not very symptomatic from the anemia. She was advised to take ferrous sulfate 325 mg tablet 3 times daily with meals and to monitor for constipation and takes stool softener/[place] as needed, and advised to notify us if she cannot tolerate oral iron. Otherwise we will continue monitoring. Will get repeat blood counts on July 20 to make sure anemia is not worsening. Will otherwise see her back in about 10 weeks with repeat labs including CBC, iron study and make further plan of management.     In between visits, the patient has been advised to call or come to the ER in case of fevers, bleeding, acute sickness or new symptoms. Patient is agreeable to this plan.   [name], MD   03/16/2015 8:56 AM

## 2015-03-31 ENCOUNTER — Inpatient Hospital Stay: Payer: Medicare Other | Attending: Internal Medicine

## 2015-03-31 DIAGNOSIS — D509 Iron deficiency anemia, unspecified: Secondary | ICD-10-CM | POA: Insufficient documentation

## 2015-03-31 LAB — CBC WITH DIFFERENTIAL/PLATELET
BASOS ABS: 0 10*3/uL (ref 0–0.1)
Basophils Relative: 1 %
EOS PCT: 4 %
Eosinophils Absolute: 0.3 10*3/uL (ref 0–0.7)
HCT: 34.2 % — ABNORMAL LOW (ref 35.0–47.0)
HEMOGLOBIN: 10.4 g/dL — AB (ref 12.0–16.0)
Lymphocytes Relative: 21 %
Lymphs Abs: 1.3 10*3/uL (ref 1.0–3.6)
MCH: 23.1 pg — AB (ref 26.0–34.0)
MCHC: 30.5 g/dL — ABNORMAL LOW (ref 32.0–36.0)
MCV: 75.7 fL — AB (ref 80.0–100.0)
MONOS PCT: 8 %
Monocytes Absolute: 0.5 10*3/uL (ref 0.2–0.9)
NEUTROS ABS: 4.2 10*3/uL (ref 1.4–6.5)
Neutrophils Relative %: 66 %
PLATELETS: 188 10*3/uL (ref 150–440)
RBC: 4.52 MIL/uL (ref 3.80–5.20)
RDW: 22.3 % — ABNORMAL HIGH (ref 11.5–14.5)
WBC: 6.3 10*3/uL (ref 3.6–11.0)

## 2015-03-31 LAB — FERRITIN: Ferritin: 19 ng/mL (ref 11–307)

## 2015-04-01 LAB — INTRINSIC FACTOR ANTIBODIES: Intrinsic Factor: 1 AU/mL (ref 0.0–1.1)

## 2015-04-06 ENCOUNTER — Telehealth: Payer: Self-pay | Admitting: *Deleted

## 2015-04-06 NOTE — Telephone Encounter (Signed)
Would like for Dr. Ma Hillock to return his call regarding pt's short term disability starting on 02/02/15.Marland KitchenMarland Kitchen

## 2015-04-06 NOTE — Telephone Encounter (Signed)
Pt states she did not call here.She wonders if it was the pharmacy she knows they are having hard time trying to get info from dr. Rebeca Alert.  She knows that dr bender does not work here. I told her I would speak to the nurse that took the message to see if there was something I could help with. Patient appreciative to this.

## 2015-05-12 ENCOUNTER — Inpatient Hospital Stay: Payer: Medicare Other | Attending: Internal Medicine

## 2015-05-12 ENCOUNTER — Inpatient Hospital Stay (HOSPITAL_BASED_OUTPATIENT_CLINIC_OR_DEPARTMENT_OTHER): Payer: Medicare Other | Admitting: Internal Medicine

## 2015-05-12 VITALS — BP 119/84 | HR 114 | Temp 96.7°F | Resp 18 | Ht 66.0 in | Wt 204.8 lb

## 2015-05-12 DIAGNOSIS — E785 Hyperlipidemia, unspecified: Secondary | ICD-10-CM | POA: Diagnosis not present

## 2015-05-12 DIAGNOSIS — Z794 Long term (current) use of insulin: Secondary | ICD-10-CM | POA: Diagnosis not present

## 2015-05-12 DIAGNOSIS — Z79899 Other long term (current) drug therapy: Secondary | ICD-10-CM | POA: Diagnosis not present

## 2015-05-12 DIAGNOSIS — J449 Chronic obstructive pulmonary disease, unspecified: Secondary | ICD-10-CM | POA: Diagnosis not present

## 2015-05-12 DIAGNOSIS — I10 Essential (primary) hypertension: Secondary | ICD-10-CM | POA: Insufficient documentation

## 2015-05-12 DIAGNOSIS — D509 Iron deficiency anemia, unspecified: Secondary | ICD-10-CM | POA: Insufficient documentation

## 2015-05-12 DIAGNOSIS — I509 Heart failure, unspecified: Secondary | ICD-10-CM | POA: Diagnosis not present

## 2015-05-12 DIAGNOSIS — E119 Type 2 diabetes mellitus without complications: Secondary | ICD-10-CM | POA: Insufficient documentation

## 2015-05-12 LAB — CBC WITH DIFFERENTIAL/PLATELET
Basophils Absolute: 0 10*3/uL (ref 0–0.1)
Basophils Relative: 0 %
EOS ABS: 0.2 10*3/uL (ref 0–0.7)
Eosinophils Relative: 3 %
HEMATOCRIT: 39.5 % (ref 35.0–47.0)
Hemoglobin: 12.8 g/dL (ref 12.0–16.0)
Lymphocytes Relative: 23 %
Lymphs Abs: 1.4 10*3/uL (ref 1.0–3.6)
MCH: 25.9 pg — ABNORMAL LOW (ref 26.0–34.0)
MCHC: 32.3 g/dL (ref 32.0–36.0)
MCV: 80.1 fL (ref 80.0–100.0)
MONO ABS: 0.5 10*3/uL (ref 0.2–0.9)
MONOS PCT: 8 %
Neutro Abs: 4 10*3/uL (ref 1.4–6.5)
Neutrophils Relative %: 66 %
Platelets: 204 10*3/uL (ref 150–440)
RBC: 4.93 MIL/uL (ref 3.80–5.20)
RDW: 20.8 % — AB (ref 11.5–14.5)
WBC: 6.1 10*3/uL (ref 3.6–11.0)

## 2015-05-12 LAB — IRON AND TIBC
IRON: 44 ug/dL (ref 28–170)
Saturation Ratios: 11 % (ref 10.4–31.8)
TIBC: 411 ug/dL (ref 250–450)
UIBC: 367 ug/dL

## 2015-05-12 LAB — FERRITIN: FERRITIN: 19 ng/mL (ref 11–307)

## 2015-05-20 ENCOUNTER — Ambulatory Visit
Admission: RE | Admit: 2015-05-20 | Discharge: 2015-05-20 | Disposition: A | Discharge status: Home / Self Care | Payer: Medicare Other | Source: Ambulatory Visit | Attending: Family Medicine | Admitting: Family Medicine

## 2015-05-20 ENCOUNTER — Other Ambulatory Visit: Payer: Self-pay | Admitting: Family Medicine

## 2015-05-20 DIAGNOSIS — I509 Heart failure, unspecified: Secondary | ICD-10-CM | POA: Diagnosis not present

## 2015-05-20 DIAGNOSIS — J9811 Atelectasis: Secondary | ICD-10-CM | POA: Diagnosis not present

## 2015-05-20 DIAGNOSIS — R0602 Shortness of breath: Secondary | ICD-10-CM | POA: Diagnosis not present

## 2015-05-27 NOTE — Progress Notes (Signed)
[place]  Telephone:(336) [phone] Fax:(336) [phone]     ID: Dorothy [name] OB: 16-Apr-1935  MR#: [id number]  [id number]  Patient Care Team: Letta Median, MD as PCP - General  CHIEF COMPLAINT/DIAGNOSIS:  Anemia. History of iron deficiency.  (labs done on 02/04/15 which showed hemoglobin low at 9.3, MCV low at 77.6, platelets 247, WBC 7700 with unremarkable differential (ANC 5200, ALC 1690, AMC 580).  On oral iron therapy.   HISTORY OF PRESENT ILLNESS:  Patient returns for continued hematology follow-up. States that she is taking oral iron, averages about 1-2 tablets, states that she is tolerating this well without major constipation or other GI side effects. She has noticed improvement in fatigability. Hemoglobin today has normalized. Denies any obvious bleeding symptoms. She has mild dyspnea on exertion which is chronic. Denies any shortness of breath at rest, angina, palpitation, dizziness, orthopnea or PND. Capsule Endoscopy on 02/17/15 which reported normal small bowel study.   REVIEW OF SYSTEMS:   ROS As in HPI above. No fever or sweats. No cough. Chronic dyspnea on exertion. No abdominal pain, nausea or vomiting. No diarrhea. History of hemorrhoids.No dysuria or hematuria. No numbness or tingling of extremities.  No polyuria or polydipsia.   PAST MEDICAL HISTORY: Reviewed. Past Medical History  Diagnosis Date  . PONV (postoperative nausea and vomiting)   . Diabetes mellitus without complication   . Hypertension   . CHF (congestive heart failure)   . COPD (chronic obstructive pulmonary disease)   . Anemia   . Hyperlipidemia     PAST SURGICAL HISTORY: Reviewed. Past Surgical History  Procedure Laterality Date  . Abdominal hysterectomy    . Shoulder arthroscopy    . Cholecystectomy    . Colonoscopy N/A 01/19/2015    Procedure: COLONOSCOPY;  Surgeon: Josefine Class, MD;  Location: [facility] ENDOSCOPY;  Service: Endoscopy;  Laterality: N/A;  .  Esophagogastroduodenoscopy N/A 01/19/2015    Procedure: ESOPHAGOGASTRODUODENOSCOPY (EGD);  Surgeon: Josefine Class, MD;  Location: [facility] ENDOSCOPY;  Service: Endoscopy;  Laterality: N/A;    FAMILY HISTORY: Reviewed. Remarkable for heart disease, diabetes, mother had blood clots. Also one brother had colon cancer, one brother had stomach cancer, father had lung cancer.  SOCIAL HISTORY: Reviewed. Social History  Substance Use Topics  . Smoking status: Never Smoker   . Smokeless tobacco: Not on file  . Alcohol Use: No    No Known Allergies  Current Outpatient Prescriptions  Medication Sig Dispense Refill  . acetaminophen (TYLENOL) 500 MG tablet Take 1,000 mg by mouth every 6 (six) hours as needed for moderate pain or headache.    . albuterol (PROVENTIL HFA;VENTOLIN HFA) 108 (90 BASE) MCG/ACT inhaler Inhale 2 puffs into the lungs every 6 (six) hours as needed for wheezing or shortness of breath.    Marland Kitchen amLODipine (NORVASC) 5 MG tablet Take 1 tablet (5 mg total) by mouth daily. 30 tablet 1  . Calcium Carb-Cholecalciferol (CALCIUM 600 + D PO) Take 1 tablet by mouth.    . cloNIDine (CATAPRES) 0.1 MG tablet Take 0.1 mg by mouth 2 (two) times daily.    Marland Kitchen esomeprazole (NEXIUM) 20 MG capsule Take 20 mg by mouth at bedtime.    . ferrous sulfate 325 (65 FE) MG tablet Take 325 mg by mouth daily with breakfast.    . glipiZIDE (GLUCOTROL XL) 10 MG 24 hr tablet Take 10 mg by mouth 2 (two) times daily.    . insulin NPH Human (HUMULIN N,NOVOLIN N) 100 UNIT/ML injection  Inject 50 Units into the skin at bedtime.     . lovastatin (MEVACOR) 40 MG tablet Take 40 mg by mouth at bedtime.    . metFORMIN (GLUMETZA) 1000 MG (MOD) 24 hr tablet Take 1,000 mg by mouth 2 (two) times daily.    . metoprolol tartrate (LOPRESSOR) 25 MG tablet Take 1 tablet (25 mg total) by mouth 2 (two) times daily. 60 tablet 1   No current facility-administered medications for this visit.    PHYSICAL EXAM: Filed Vitals:   05/12/15  1031  BP: 119/84  Pulse: 114  Temp: 96.7 F (35.9 C)  Resp: 18     Body mass index is 33.07 kg/(m^2).     GENERAL: Patient is alert and oriented and in no acute distress. There is no icterus or pallor. HEENT: EOMs intact. No cervical lymphadenopathy. CVS: S1S2, regular LUNGS: Bilaterally clear to auscultation, no rhonchi. ABDOMEN: Soft, nontender.    EXTREMITIES: No pedal edema.  LAB RESULTS: 05/12/15 - hemoglobin 12.8, platelets 204, WBC 6.1, iron 44, TIBC 411, iron saturation 11%, ferritin 19. 02/04/15 - hemoglobin low at 9.3, MCV low at 77.6, platelets 247, WBC 7700 with unremarkable differential, ANC 5200, ALC 1690, AMC 580.  STUDIES: May 2016 -   Colonoscopy -  EGD - reported as normal esophagus. Patent Billroth I gastroduodenostomy was found, characterized by healthy appearing mucosal. Normal exam and jejunum. Biopsied. No source of IDA sen.   02/17/15 - Capsule Endoscopy reported normal small bowel study.    ASSESSMENT / PLAN:   Anemia. History of iron deficiency  (labs done on 02/04/15 which showed hemoglobin low at 9.3, MCV low at 77.6, platelets 247, WBC 7700 with unremarkable differential (ANC 5200, ALC 1690, AMC 580)  -  reviewed labs are discussed with patient. Hemoglobin has normalized. Iron study shows serum iron, iron saturation and ferritin are in the low normal range. Patient is clinically doing better. Given improvement in iron deficiency anemia, do not see the need for continued hematology follow-up and she is being discharged from our clinic. Have recommended that she continue to take ferrous sulfate 325 mg tablet 2 times daily. Recommend monitoring hemoglobin and iron studies upon primary care visits and adjust dose of iron accordingly. I would be happy to see her back in the future if any recurrent iron deficiency anemia or other hematology issues should occur.       In between visits, the patient has been advised to call or come to the ER in case of fevers, bleeding,  acute sickness or new symptoms. She is agreeable to this plan.   [name], MD   05/27/2015 9:35 PM

## 2015-06-07 ENCOUNTER — Other Ambulatory Visit: Payer: Self-pay | Admitting: Specialist

## 2015-06-07 DIAGNOSIS — R942 Abnormal results of pulmonary function studies: Secondary | ICD-10-CM

## 2015-06-14 ENCOUNTER — Ambulatory Visit
Admission: RE | Admit: 2015-06-14 | Discharge: 2015-06-14 | Disposition: A | Discharge status: Home / Self Care | Payer: Medicare Other | Source: Ambulatory Visit | Attending: Specialist | Admitting: Specialist

## 2015-06-14 DIAGNOSIS — I272 Other secondary pulmonary hypertension: Secondary | ICD-10-CM | POA: Insufficient documentation

## 2015-06-14 DIAGNOSIS — R0602 Shortness of breath: Secondary | ICD-10-CM | POA: Diagnosis present

## 2015-06-14 DIAGNOSIS — I2510 Atherosclerotic heart disease of native coronary artery without angina pectoris: Secondary | ICD-10-CM | POA: Insufficient documentation

## 2015-06-14 DIAGNOSIS — R942 Abnormal results of pulmonary function studies: Secondary | ICD-10-CM | POA: Diagnosis present

## 2015-06-29 ENCOUNTER — Other Ambulatory Visit: Payer: Self-pay | Admitting: Specialist

## 2015-06-29 DIAGNOSIS — R0609 Other forms of dyspnea: Principal | ICD-10-CM

## 2015-06-29 DIAGNOSIS — I272 Pulmonary hypertension, unspecified: Secondary | ICD-10-CM

## 2015-07-07 ENCOUNTER — Ambulatory Visit
Admission: RE | Admit: 2015-07-07 | Discharge: 2015-07-07 | Disposition: A | Discharge status: Home / Self Care | Payer: Medicare Other | Source: Ambulatory Visit | Attending: Specialist | Admitting: Specialist

## 2015-07-07 DIAGNOSIS — R0602 Shortness of breath: Secondary | ICD-10-CM | POA: Diagnosis present

## 2015-07-07 DIAGNOSIS — I272 Other secondary pulmonary hypertension: Secondary | ICD-10-CM | POA: Insufficient documentation

## 2015-07-07 DIAGNOSIS — R0609 Other forms of dyspnea: Secondary | ICD-10-CM

## 2015-07-07 MED ORDER — TECHNETIUM TO 99M ALBUMIN AGGREGATED
3.8480 | Freq: Once | INTRAVENOUS | Status: AC | PRN
  Administered 2015-07-07: 3.848 via INTRAVENOUS

## 2015-07-07 MED ORDER — TECHNETIUM TC 99M DIETHYLENETRIAME-PENTAACETIC ACID
39.5790 | Freq: Once | INTRAVENOUS | Status: DC | PRN
  Administered 2015-07-07: 39.579 via INTRAVENOUS
  Filled 2015-07-07: qty 39.58

## 2015-07-27 ENCOUNTER — Ambulatory Visit: Payer: Medicare Other | Attending: Otolaryngology

## 2015-07-27 DIAGNOSIS — G4733 Obstructive sleep apnea (adult) (pediatric): Secondary | ICD-10-CM | POA: Insufficient documentation

## 2015-07-27 DIAGNOSIS — F5101 Primary insomnia: Secondary | ICD-10-CM | POA: Insufficient documentation

## 2015-07-27 DIAGNOSIS — R0602 Shortness of breath: Secondary | ICD-10-CM | POA: Diagnosis present

## 2015-08-18 ENCOUNTER — Ambulatory Visit: Payer: Medicare Other | Attending: Otolaryngology

## 2015-08-18 DIAGNOSIS — F5101 Primary insomnia: Secondary | ICD-10-CM | POA: Diagnosis not present

## 2015-08-18 DIAGNOSIS — G4733 Obstructive sleep apnea (adult) (pediatric): Secondary | ICD-10-CM | POA: Diagnosis not present

## 2015-08-18 DIAGNOSIS — I509 Heart failure, unspecified: Secondary | ICD-10-CM | POA: Diagnosis present

## 2016-07-05 NOTE — Discharge Instructions (Signed)

## 2016-07-07 ENCOUNTER — Encounter: Payer: Self-pay | Admitting: *Deleted

## 2016-07-12 ENCOUNTER — Encounter
Admission: RE | Disposition: A | Discharge status: Home / Self Care | Payer: Self-pay | Source: Ambulatory Visit | Attending: Ophthalmology

## 2016-07-12 ENCOUNTER — Ambulatory Visit: Payer: Medicare Other | Admitting: Student in an Organized Health Care Education/Training Program

## 2016-07-12 ENCOUNTER — Ambulatory Visit
Admission: RE | Admit: 2016-07-12 | Discharge: 2016-07-12 | Disposition: A | Discharge status: Home / Self Care | Payer: Medicare Other | Source: Ambulatory Visit | Attending: Ophthalmology | Admitting: Ophthalmology

## 2016-07-12 DIAGNOSIS — I509 Heart failure, unspecified: Secondary | ICD-10-CM | POA: Diagnosis not present

## 2016-07-12 DIAGNOSIS — E1136 Type 2 diabetes mellitus with diabetic cataract: Secondary | ICD-10-CM | POA: Insufficient documentation

## 2016-07-12 DIAGNOSIS — K219 Gastro-esophageal reflux disease without esophagitis: Secondary | ICD-10-CM | POA: Insufficient documentation

## 2016-07-12 DIAGNOSIS — Z6833 Body mass index (BMI) 33.0-33.9, adult: Secondary | ICD-10-CM | POA: Insufficient documentation

## 2016-07-12 DIAGNOSIS — J449 Chronic obstructive pulmonary disease, unspecified: Secondary | ICD-10-CM | POA: Diagnosis not present

## 2016-07-12 DIAGNOSIS — G4730 Sleep apnea, unspecified: Secondary | ICD-10-CM | POA: Diagnosis not present

## 2016-07-12 DIAGNOSIS — I110 Hypertensive heart disease with heart failure: Secondary | ICD-10-CM | POA: Diagnosis not present

## 2016-07-12 HISTORY — PX: CATARACT EXTRACTION W/PHACO: SHX586

## 2016-07-12 HISTORY — DX: Sleep apnea, unspecified: G47.30

## 2016-07-12 HISTORY — DX: Unspecified atrial fibrillation: I48.91

## 2016-07-12 HISTORY — DX: Gastro-esophageal reflux disease without esophagitis: K21.9

## 2016-07-12 HISTORY — DX: Dyspnea, unspecified: R06.00

## 2016-07-12 HISTORY — DX: Presence of dental prosthetic device (complete) (partial): Z97.2

## 2016-07-12 HISTORY — DX: Unspecified osteoarthritis, unspecified site: M19.90

## 2016-07-12 LAB — GLUCOSE, CAPILLARY
Glucose-Capillary: 103 mg/dL — ABNORMAL HIGH (ref 65–99)
Glucose-Capillary: 88 mg/dL (ref 65–99)

## 2016-07-12 SURGERY — PHACOEMULSIFICATION, CATARACT, WITH IOL INSERTION
Anesthesia: Monitor Anesthesia Care | Site: Eye | Laterality: Right | Wound class: Clean

## 2016-07-12 MED ORDER — ACETAMINOPHEN 160 MG/5ML PO SOLN: 325.0000 mg | ORAL | Status: DC | PRN

## 2016-07-12 MED ORDER — CEFUROXIME OPHTHALMIC INJECTION 1 MG/0.1 ML
INJECTION | OPHTHALMIC | Status: DC | PRN
  Administered 2016-07-12: 0.1 mL via OPHTHALMIC

## 2016-07-12 MED ORDER — ARMC OPHTHALMIC DILATING DROPS
1.0000 "application " | OPHTHALMIC | Status: DC | PRN
  Administered 2016-07-12 (×3): 1 via OPHTHALMIC

## 2016-07-12 MED ORDER — MOXIFLOXACIN HCL 0.5 % OP SOLN
1.0000 [drp] | OPHTHALMIC | Status: DC | PRN
  Administered 2016-07-12 (×3): 1 [drp] via OPHTHALMIC

## 2016-07-12 MED ORDER — ONDANSETRON HCL 4 MG/2ML IJ SOLN
INTRAMUSCULAR | Status: DC | PRN
  Administered 2016-07-12: 4 mg via INTRAVENOUS

## 2016-07-12 MED ORDER — FENTANYL CITRATE (PF) 100 MCG/2ML IJ SOLN
INTRAMUSCULAR | Status: DC | PRN
  Administered 2016-07-12: 50 ug via INTRAVENOUS

## 2016-07-12 MED ORDER — LIDOCAINE HCL (PF) 4 % IJ SOLN
INTRAMUSCULAR | Status: DC | PRN
  Administered 2016-07-12: 1 mL via OPHTHALMIC

## 2016-07-12 MED ORDER — LACTATED RINGERS IV SOLN: INTRAVENOUS | Status: DC

## 2016-07-12 MED ORDER — BRIMONIDINE TARTRATE 0.2 % OP SOLN
OPHTHALMIC | Status: DC | PRN
  Administered 2016-07-12: 1 [drp] via OPHTHALMIC

## 2016-07-12 MED ORDER — TIMOLOL MALEATE 0.5 % OP SOLN
OPHTHALMIC | Status: DC | PRN
Start: 2016-07-12 — End: 2016-07-12
  Administered 2016-07-12: 1 [drp] via OPHTHALMIC

## 2016-07-12 MED ORDER — POVIDONE-IODINE 5 % OP SOLN
OPHTHALMIC | Status: DC | PRN
  Administered 2016-07-12: 1 via OPHTHALMIC

## 2016-07-12 MED ORDER — BSS IO SOLN
INTRAOCULAR | Status: DC | PRN
  Administered 2016-07-12: 67 mL via OPHTHALMIC

## 2016-07-12 MED ORDER — ACETAMINOPHEN 325 MG PO TABS: 325.0000 mg | ORAL_TABLET | ORAL | Status: DC | PRN

## 2016-07-12 MED ORDER — NA HYALUR & NA CHOND-NA HYALUR 0.4-0.35 ML IO KIT
PACK | INTRAOCULAR | Status: DC | PRN
  Administered 2016-07-12: 1 mL via INTRAOCULAR

## 2016-07-12 MED ORDER — MIDAZOLAM HCL 2 MG/2ML IJ SOLN
INTRAMUSCULAR | Status: DC | PRN
  Administered 2016-07-12 (×2): 1 mg via INTRAVENOUS

## 2016-07-12 SURGICAL SUPPLY — 25 items
CANNULA ANT/CHMB 27GA (MISCELLANEOUS) ×3 IMPLANT
CARTRIDGE ABBOTT (MISCELLANEOUS) IMPLANT
GLOVE SURG LX 7.5 STRW (GLOVE) ×2
GLOVE SURG LX STRL 7.5 STRW (GLOVE) ×1 IMPLANT
GLOVE SURG TRIUMPH 8.0 PF LTX (GLOVE) ×3 IMPLANT
GOWN STRL REUS W/ TWL LRG LVL3 (GOWN DISPOSABLE) ×2 IMPLANT
GOWN STRL REUS W/TWL LRG LVL3 (GOWN DISPOSABLE) ×4
LENS IOL TECNIS ITEC 23.0 (Intraocular Lens) ×3 IMPLANT
MARKER SKIN DUAL TIP RULER LAB (MISCELLANEOUS) ×3 IMPLANT
NDL RETROBULBAR .5 NSTRL (NEEDLE) IMPLANT
NEEDLE FILTER BLUNT 18X 1/2SAF (NEEDLE) ×4
NEEDLE FILTER BLUNT 18X1 1/2 (NEEDLE) ×2 IMPLANT
PACK CATARACT BRASINGTON (MISCELLANEOUS) ×3 IMPLANT
PACK EYE AFTER SURG (MISCELLANEOUS) ×3 IMPLANT
PACK OPTHALMIC (MISCELLANEOUS) ×3 IMPLANT
RING MALYGIN 7.0 (MISCELLANEOUS) IMPLANT
SUT ETHILON 10-0 CS-B-6CS-B-6 (SUTURE)
SUT VICRYL  9 0 (SUTURE)
SUT VICRYL 9 0 (SUTURE) IMPLANT
SUTURE EHLN 10-0 CS-B-6CS-B-6 (SUTURE) IMPLANT
SYR 3ML LL SCALE MARK (SYRINGE) ×6 IMPLANT
SYR 5ML LL (SYRINGE) ×3 IMPLANT
SYR TB 1ML LUER SLIP (SYRINGE) ×3 IMPLANT
WATER STERILE IRR 250ML POUR (IV SOLUTION) ×3 IMPLANT
WIPE NON LINTING 3.25X3.25 (MISCELLANEOUS) ×3 IMPLANT

## 2016-07-12 NOTE — Op Note (Signed)
LOCATION:  [place]   PREOPERATIVE DIAGNOSIS:    Nuclear sclerotic cataract right eye. H25.11   POSTOPERATIVE DIAGNOSIS:  Nuclear sclerotic cataract right eye.     PROCEDURE:  Phacoemusification with posterior chamber intraocular lens placement of the right eye   LENS:   Implant Name Type Inv. Item Serial No. Manufacturer Lot No. LRB No. Used  LENS IOL DIOP 23.0 - [id number] Intraocular Lens LENS IOL DIOP 23.0 [id number]   Right 1        ULTRASOUND TIME: 15 % of 1 minutes, 35 seconds.  CDE 14.0   SURGEON:  Wyonia Hough, MD   ANESTHESIA:  Topical with tetracaine drops and 2% Xylocaine jelly, augmented with 1% preservative-free intracameral lidocaine.    COMPLICATIONS:  None.   DESCRIPTION OF PROCEDURE:  The patient was identified in the holding room and transported to the operating room and placed in the supine position under the operating microscope.  The right eye was identified as the operative eye and it was prepped and draped in the usual sterile ophthalmic fashion.   A 1 millimeter clear-corneal paracentesis was made at the 12:00 position.  0.5 ml of preservative-free 1% lidocaine was injected into the anterior chamber. The anterior chamber was filled with Viscoat viscoelastic.  A 2.4 millimeter keratome was used to make a near-clear corneal incision at the 9:00 position.  A curvilinear capsulorrhexis was made with a cystotome and capsulorrhexis forceps.  Balanced salt solution was used to hydrodissect and hydrodelineate the nucleus.   Phacoemulsification was then used in stop and chop fashion to remove the lens nucleus and epinucleus.  The remaining cortex was then removed using the irrigation and aspiration handpiece. Provisc was then placed into the capsular bag to distend it for lens placement.  A lens was then injected into the capsular bag.  The remaining viscoelastic was aspirated.   Wounds were hydrated with balanced salt solution.  The anterior  chamber was inflated to a physiologic pressure with balanced salt solution.  No wound leaks were noted. Cefuroxime 0.1 ml of a 10mg /ml solution was injected into the anterior chamber for a dose of 1 mg of intracameral antibiotic at the completion of the case.   Timolol and Brimonidine drops were applied to the eye.  The patient was taken to the recovery room in stable condition without complications of anesthesia or surgery.   Liya Strollo 07/12/2016, 8:03 AM

## 2016-07-12 NOTE — Anesthesia Preprocedure Evaluation (Signed)
Anesthesia Evaluation  Patient identified by MRN, date of birth, ID band Patient awake    Reviewed: Allergy & Precautions, H&P , NPO status , Patient's Chart, lab work & pertinent test results  Airway Mallampati: II  TM Distance: >3 FB Neck ROM: full    Dental  (+) Edentulous Lower, Upper Dentures   Pulmonary shortness of breath, sleep apnea , COPD,    Pulmonary exam normal        Cardiovascular hypertension, +CHF  Normal cardiovascular exam     Neuro/Psych    GI/Hepatic GERD  ,  Endo/Other  diabetesMorbid obesity  Renal/GU      Musculoskeletal   Abdominal   Peds  Hematology   Anesthesia Other Findings   Reproductive/Obstetrics                             Anesthesia Physical Anesthesia Plan  ASA: III  Anesthesia Plan: MAC   Post-op Pain Management:    Induction:   Airway Management Planned:   Additional Equipment:   Intra-op Plan:   Post-operative Plan:   Informed Consent: I have reviewed the patients History and Physical, chart, labs and discussed the procedure including the risks, benefits and alternatives for the proposed anesthesia with the patient or authorized representative who has indicated his/her understanding and acceptance.     Plan Discussed with:   Anesthesia Plan Comments:         Anesthesia Quick Evaluation

## 2016-07-12 NOTE — Transfer of Care (Signed)
Immediate Anesthesia Transfer of Care Note  Patient: Dorothy [name]  Procedure(s) Performed: Procedure(s) with comments: CATARACT EXTRACTION PHACO AND INTRAOCULAR LENS PLACEMENT (IOC) (Right) - DIABETIC - insulin and oral meds sleep apnea  Patient Location: PACU  Anesthesia Type: MAC  Level of Consciousness: awake, alert  and patient cooperative  Airway and Oxygen Therapy: Patient Spontanous Breathing and Patient connected to supplemental oxygen  Post-op Assessment: Post-op Vital signs reviewed, Patient's Cardiovascular Status Stable, Respiratory Function Stable, Patent Airway and No signs of Nausea or vomiting  Post-op Vital Signs: Reviewed and stable  Complications: No apparent anesthesia complications

## 2016-07-12 NOTE — Anesthesia Procedure Notes (Signed)
Procedure Name: MAC Date/Time: 07/12/2016 7:42 AM Performed by: Cameron Ali Pre-anesthesia Checklist: Patient identified, Emergency Drugs available, Suction available, Timeout performed and Patient being monitored Patient Re-evaluated:Patient Re-evaluated prior to inductionOxygen Delivery Method: Nasal cannula Placement Confirmation: positive ETCO2

## 2016-07-12 NOTE — Anesthesia Postprocedure Evaluation (Signed)
Anesthesia Post Note  Patient: Dorothy [name]  Procedure(s) Performed: Procedure(s) (LRB): CATARACT EXTRACTION PHACO AND INTRAOCULAR LENS PLACEMENT (IOC) (Right)  Patient location during evaluation: PACU Anesthesia Type: MAC Level of consciousness: awake and alert and oriented Pain management: satisfactory to patient Vital Signs Assessment: post-procedure vital signs reviewed and stable Respiratory status: spontaneous breathing, nonlabored ventilation and respiratory function stable Cardiovascular status: blood pressure returned to baseline and stable Postop Assessment: Adequate PO intake and No signs of nausea or vomiting Anesthetic complications: no    Raliegh Ip

## 2016-07-12 NOTE — H&P (Signed)
The History and Physical notes are on paper, have been signed, and are to be scanned. The patient remains stable and unchanged from the H&P.   Previous H&P reviewed, patient examined, and there are no changes.  Dorothy [name] 07/12/2016 7:35 AM

## 2016-07-13 ENCOUNTER — Encounter: Payer: Self-pay | Admitting: Ophthalmology

## 2016-09-03 ENCOUNTER — Emergency Department
Admission: EM | Admit: 2016-09-03 | Discharge: 2016-09-04 | Disposition: A | Discharge status: Home / Self Care | Payer: Medicare Other | Attending: Emergency Medicine | Admitting: Emergency Medicine

## 2016-09-03 DIAGNOSIS — M7989 Other specified soft tissue disorders: Secondary | ICD-10-CM | POA: Diagnosis present

## 2016-09-03 DIAGNOSIS — I110 Hypertensive heart disease with heart failure: Secondary | ICD-10-CM | POA: Insufficient documentation

## 2016-09-03 DIAGNOSIS — E119 Type 2 diabetes mellitus without complications: Secondary | ICD-10-CM | POA: Insufficient documentation

## 2016-09-03 DIAGNOSIS — J449 Chronic obstructive pulmonary disease, unspecified: Secondary | ICD-10-CM | POA: Insufficient documentation

## 2016-09-03 DIAGNOSIS — J811 Chronic pulmonary edema: Secondary | ICD-10-CM | POA: Insufficient documentation

## 2016-09-03 DIAGNOSIS — I509 Heart failure, unspecified: Secondary | ICD-10-CM | POA: Insufficient documentation

## 2016-09-03 DIAGNOSIS — R0602 Shortness of breath: Secondary | ICD-10-CM

## 2016-09-03 DIAGNOSIS — Z79899 Other long term (current) drug therapy: Secondary | ICD-10-CM | POA: Diagnosis not present

## 2016-09-03 DIAGNOSIS — Z794 Long term (current) use of insulin: Secondary | ICD-10-CM | POA: Insufficient documentation

## 2016-09-03 DIAGNOSIS — R600 Localized edema: Secondary | ICD-10-CM

## 2016-09-03 DIAGNOSIS — I501 Left ventricular failure: Secondary | ICD-10-CM

## 2016-09-03 LAB — CBC
HEMATOCRIT: 29.3 % — AB (ref 35.0–47.0)
HEMOGLOBIN: 9.1 g/dL — AB (ref 12.0–16.0)
MCH: 23.3 pg — AB (ref 26.0–34.0)
MCHC: 31.2 g/dL — AB (ref 32.0–36.0)
MCV: 74.6 fL — ABNORMAL LOW (ref 80.0–100.0)
Platelets: 244 10*3/uL (ref 150–440)
RBC: 3.92 MIL/uL (ref 3.80–5.20)
RDW: 16 % — AB (ref 11.5–14.5)
WBC: 6.8 10*3/uL (ref 3.6–11.0)

## 2016-09-03 LAB — BRAIN NATRIURETIC PEPTIDE: B NATRIURETIC PEPTIDE 5: 282 pg/mL — AB (ref 0.0–100.0)

## 2016-09-03 LAB — BASIC METABOLIC PANEL
ANION GAP: 7 (ref 5–15)
BUN: 19 mg/dL (ref 6–20)
CO2: 27 mmol/L (ref 22–32)
Calcium: 9 mg/dL (ref 8.9–10.3)
Chloride: 105 mmol/L (ref 101–111)
Creatinine, Ser: 1.21 mg/dL — ABNORMAL HIGH (ref 0.44–1.00)
GFR calc Af Amer: 48 mL/min — ABNORMAL LOW (ref >60)
GFR, EST NON AFRICAN AMERICAN: 41 mL/min — AB (ref >60)
GLUCOSE: 253 mg/dL — AB (ref 65–99)
POTASSIUM: 4.4 mmol/L (ref 3.5–5.1)
Sodium: 139 mmol/L (ref 135–145)

## 2016-09-03 LAB — TROPONIN I: Troponin I: 0.03 ng/mL (ref <0.03)

## 2016-09-03 NOTE — ED Triage Notes (Addendum)
Pt says she has had weeping from edematous lower legs since last evening; worked all day at [employer] today and now drainage has increased; pt with history of CHF and takes Lasix; has been taking as prescribed; feeling some increased shortness of breath; denies chest pain; lungs clear in triage

## 2016-09-04 ENCOUNTER — Emergency Department: Payer: Medicare Other

## 2016-09-04 LAB — TROPONIN I: TROPONIN I: 0.03 ng/mL — AB (ref <0.03)

## 2016-09-04 MED ORDER — FUROSEMIDE 10 MG/ML IJ SOLN
40.0000 mg | Freq: Once | INTRAMUSCULAR | Status: AC
  Administered 2016-09-04: 40 mg via INTRAVENOUS
  Filled 2016-09-04: qty 4

## 2016-09-04 NOTE — ED Notes (Signed)
Pt up to urinate.

## 2016-09-04 NOTE — Discharge Instructions (Signed)
Please take her Lasix as prescribed. Please follow up with her primary care physician.

## 2016-09-04 NOTE — ED Notes (Signed)
Pt with weeping, clear fluid noted to right lower extremity mid tibial. Pt with approx 1cm circumfrental area noted to right lateral lower leg that has scant bright red drainage. Pt with 1+ pedal edema noted to right foot.

## 2016-09-04 NOTE — ED Provider Notes (Signed)
[facility] Emergency Department Provider Note   ____________________________________________   First MD Initiated Contact with Patient 09/03/16 2350     (approximate)  I have reviewed the triage vital signs and the nursing notes.   HISTORY  Chief Complaint Leg Swelling and Shortness of Breath   HPI Dorothy [name] is a 81 y.o. female who comes into the hospital today with some fluid coming out of her leg. The patient reports she soaked through 2 sets of bandages. She assumed that it started sometime last night but it was not very bad until she went to work. The patient reports that it just on the right side. She denies any pain at this time. There is a spot where she looks like she might have a wound and that is the main location the fluid is leaking from. The patient does have a history of congestive heart failure and has not been taking her fluid pills as prescribed. The patient reports that because she is working she can always go to the bathroom so she may take 1 when she comes home but that is also not every day. The patient also has a little bit of shortness of breath but denies any chest pain. She doesn't have any abdominal pain nausea or vomiting. The patient reports that she just wanted to come and get it checked out.She has no lightheadedness or dizziness.   Past Medical History:  Diagnosis Date  . Anemia   . Arthritis    hands, knees, back  . Atrial fibrillation ([place])   . CHF (congestive heart failure) ([place])   . COPD (chronic obstructive pulmonary disease) ([place])   . Diabetes mellitus without complication ([place])   . Dyspnea   . GERD (gastroesophageal reflux disease)   . Hyperlipidemia   . Hypertension   . PONV (postoperative nausea and vomiting)   . Sleep apnea    told she needs CPAP, does not have  . Wears dentures    full upper and lower.  only wears upper    Patient Active Problem List   Diagnosis Date Noted  . CHF (congestive heart  failure) ([place]) 01/10/2015    Past Surgical History:  Procedure Laterality Date  . ABDOMINAL HYSTERECTOMY    . CATARACT EXTRACTION W/PHACO Right 07/12/2016   Procedure: CATARACT EXTRACTION PHACO AND INTRAOCULAR LENS PLACEMENT (IOC);  Surgeon: Leandrew Koyanagi, MD;  Location: [facility];  Service: Ophthalmology;  Laterality: Right;  DIABETIC - insulin and oral meds sleep apnea  . CHOLECYSTECTOMY    . COLONOSCOPY N/A 01/19/2015   Procedure: COLONOSCOPY;  Surgeon: Josefine Class, MD;  Location: [facility] ENDOSCOPY;  Service: Endoscopy;  Laterality: N/A;  . ESOPHAGOGASTRODUODENOSCOPY N/A 01/19/2015   Procedure: ESOPHAGOGASTRODUODENOSCOPY (EGD);  Surgeon: Josefine Class, MD;  Location: [facility] ENDOSCOPY;  Service: Endoscopy;  Laterality: N/A;  . SHOULDER ARTHROSCOPY      Prior to Admission medications   Medication Sig Start Date End Date Taking? Authorizing Provider  acetaminophen (TYLENOL) 500 MG tablet Take 1,000 mg by mouth every 6 (six) hours as needed for moderate pain or headache.   Yes Historical Provider, MD  albuterol (PROVENTIL HFA;VENTOLIN HFA) 108 (90 BASE) MCG/ACT inhaler Inhale 2 puffs into the lungs every 6 (six) hours as needed for wheezing or shortness of breath.   Yes Historical Provider, MD  apixaban (ELIQUIS) 5 MG TABS tablet Take 5 mg by mouth 2 (two) times daily.   Yes Historical Provider, MD  atorvastatin (LIPITOR) 80 MG tablet Take  80 mg by mouth daily.   Yes Historical Provider, MD  Calcium Carb-Cholecalciferol (CALCIUM 600 + D PO) Take 1 tablet by mouth.   Yes Historical Provider, MD  cloNIDine (CATAPRES) 0.1 MG tablet Take 0.1 mg by mouth 2 (two) times daily.   Yes Historical Provider, MD  esomeprazole (NEXIUM) 20 MG capsule Take 20 mg by mouth at bedtime.   Yes Historical Provider, MD  ferrous sulfate 325 (65 FE) MG tablet Take 325 mg by mouth daily with breakfast.   Yes Historical Provider, MD  furosemide (LASIX) 20 MG tablet Take 20 mg by mouth 2 (two)  times daily.   Yes Historical Provider, MD  glipiZIDE (GLUCOTROL XL) 10 MG 24 hr tablet Take 10 mg by mouth 2 (two) times daily.   Yes Historical Provider, MD  insulin NPH Human (HUMULIN N,NOVOLIN N) 100 UNIT/ML injection Inject 28 Units into the skin 2 (two) times daily before a meal.    Yes Historical Provider, MD  lisinopril (PRINIVIL,ZESTRIL) 10 MG tablet Take 10 mg by mouth daily.   Yes Historical Provider, MD  metFORMIN (GLUMETZA) 1000 MG (MOD) 24 hr tablet Take 1,000 mg by mouth 2 (two) times daily.   Yes Historical Provider, MD    Allergies Patient has no known allergies.  No family history on file.  Social History Social History  Substance Use Topics  . Smoking status: Never Smoker  . Smokeless tobacco: Never Used  . Alcohol use No    Review of Systems Constitutional: No fever/chills Eyes: No visual changes. ENT: No sore throat. Cardiovascular: Denies chest pain. Respiratory:  shortness of breath. Gastrointestinal: No abdominal pain.  No nausea, no vomiting.  No diarrhea.  No constipation. Genitourinary: Negative for dysuria. Musculoskeletal: Negative for back pain. Skin: Negative for rash. Neurological: Negative for headaches, focal weakness or numbness. Lymph: Weeping from right lower extremity  10-point ROS otherwise negative.  ____________________________________________   PHYSICAL EXAM:  VITAL SIGNS: ED Triage Vitals  Enc Vitals Group     BP 09/03/16 2257 (!) 198/85     Pulse Rate 09/03/16 2257 78     Resp 09/03/16 2257 20     Temp 09/03/16 2257 97.7 F (36.5 C)     Temp Source 09/03/16 2257 Oral     SpO2 09/03/16 2257 98 %     Weight 09/03/16 2258 205 lb (93 kg)     Height 09/03/16 2258 5' 5.5" (1.664 m)     Head Circumference --      Peak Flow --      Pain Score --      Pain Loc --      Pain Edu? --      Excl. in [place]? --     Constitutional: Alert and oriented. Well appearing and in Mild distress. Eyes: Conjunctivae are normal. PERRL.  EOMI. Head: Atraumatic. Nose: No congestion/rhinnorhea. Mouth/Throat: Mucous membranes are moist.  Oropharynx non-erythematous. Cardiovascular: Normal rate, regular rhythm. Grossly normal heart sounds.  Good peripheral circulation. Respiratory: Normal respiratory effort.  No retractions. Lungs CTAB. Gastrointestinal: Soft and nontender. No distention. Positive bowel sounds Musculoskeletal: Bilateral lower extremity edema   Neurologic:  Normal speech and language.  Skin:  Skin is warm, dry and intact. Small area of erythema to the right lateral leg with no warmth, no induration and mild tenderness to palpation Psychiatric: Mood and affect are normal.   ____________________________________________   LABS (all labs ordered are listed, but only abnormal results are displayed)  Labs Reviewed  BASIC METABOLIC  PANEL - Abnormal; Notable for the following:       Result Value   Glucose, Bld 253 (*)    Creatinine, Ser 1.21 (*)    GFR calc non Af Amer 41 (*)    GFR calc Af Amer 48 (*)    All other components within normal limits  CBC - Abnormal; Notable for the following:    Hemoglobin 9.1 (*)    HCT 29.3 (*)    MCV 74.6 (*)    MCH 23.3 (*)    MCHC 31.2 (*)    RDW 16.0 (*)    All other components within normal limits  TROPONIN I - Abnormal; Notable for the following:    Troponin I 0.03 (*)    All other components within normal limits  BRAIN NATRIURETIC PEPTIDE - Abnormal; Notable for the following:    B Natriuretic Peptide 282.0 (*)    All other components within normal limits  TROPONIN I - Abnormal; Notable for the following:    Troponin I 0.03 (*)    All other components within normal limits   ____________________________________________  EKG  ED ECG REPORT I, Loney Hering, the attending physician, personally viewed and interpreted this ECG.   Date: 09/03/2016  EKG Time: 2308  Rate: 88  Rhythm: atrial fibrillation  Axis: normal  Intervals:none  ST&T Change:  none  ____________________________________________  RADIOLOGY  CXR ____________________________________________   PROCEDURES  Procedure(s) performed: None  Procedures  Critical Care performed: No  ____________________________________________   INITIAL IMPRESSION / ASSESSMENT AND PLAN / ED COURSE  Pertinent labs & imaging results that were available during my care of the patient were reviewed by me and considered in my medical decision making (see chart for details).  This is a 81 year old female who comes into the hospital today with the complaint of drainage from her right lower extremity. The patient has also had some shortness of breath. The patient has not been taking her fluid pills the way she is supposed to which is likely the cause of her symptoms. I did check a BNP and it is not unremarkable. The patient also received a chest x-ray. The family is concerned she may have MRSA but the area of redness is a small area with no induration no swelling and no significant cellulitis. I will give the patient a dose of Lasix and I will reassess the patient.  Clinical Course as of Sep 04 248  Mon Sep 04, 2016  0115 Cardiomegaly and mild interstitial pulmonary edema. DG Chest 2 View [AW]    Clinical Course User Index [AW] Loney Hering, MD   The patient did urinate while here in the weeping is improved. The patient will be discharged to home and instructed to take her Lasix as prescribed. The patient had some mild edema but her sats are okay and she is not having any significant respiratory distress. The patient will be discharged to follow-up.  ____________________________________________   FINAL CLINICAL IMPRESSION(S) / ED DIAGNOSES  Final diagnoses:  Leg edema  Shortness of breath  Pulmonary edema with congestive heart failure (HCC)      NEW MEDICATIONS STARTED DURING THIS VISIT:  New Prescriptions   No medications on file     Note:  This document was  prepared using Dragon voice recognition software and may include unintentional dictation errors.    Loney Hering, MD 09/04/16 [phone]

## 2016-09-04 NOTE — ED Notes (Signed)
Pt up to commode with assist to void.

## 2016-12-17 ENCOUNTER — Emergency Department: Payer: Medicare Other

## 2016-12-17 ENCOUNTER — Encounter: Payer: Self-pay | Admitting: Emergency Medicine

## 2016-12-17 ENCOUNTER — Emergency Department
Admission: EM | Admit: 2016-12-17 | Discharge: 2016-12-17 | Disposition: A | Discharge status: Home / Self Care | Payer: Medicare Other | Source: Home / Self Care | Attending: Emergency Medicine | Admitting: Emergency Medicine

## 2016-12-17 DIAGNOSIS — Y92009 Unspecified place in unspecified non-institutional (private) residence as the place of occurrence of the external cause: Principal | ICD-10-CM

## 2016-12-17 DIAGNOSIS — E162 Hypoglycemia, unspecified: Secondary | ICD-10-CM | POA: Diagnosis not present

## 2016-12-17 DIAGNOSIS — W06XXXA Fall from bed, initial encounter: Secondary | ICD-10-CM

## 2016-12-17 DIAGNOSIS — I110 Hypertensive heart disease with heart failure: Secondary | ICD-10-CM

## 2016-12-17 DIAGNOSIS — S39012A Strain of muscle, fascia and tendon of lower back, initial encounter: Secondary | ICD-10-CM

## 2016-12-17 DIAGNOSIS — Y999 Unspecified external cause status: Secondary | ICD-10-CM | POA: Insufficient documentation

## 2016-12-17 DIAGNOSIS — E119 Type 2 diabetes mellitus without complications: Secondary | ICD-10-CM

## 2016-12-17 DIAGNOSIS — I509 Heart failure, unspecified: Secondary | ICD-10-CM

## 2016-12-17 DIAGNOSIS — Z794 Long term (current) use of insulin: Secondary | ICD-10-CM | POA: Insufficient documentation

## 2016-12-17 DIAGNOSIS — S0990XA Unspecified injury of head, initial encounter: Secondary | ICD-10-CM

## 2016-12-17 DIAGNOSIS — W19XXXA Unspecified fall, initial encounter: Secondary | ICD-10-CM

## 2016-12-17 DIAGNOSIS — Y939 Activity, unspecified: Secondary | ICD-10-CM

## 2016-12-17 DIAGNOSIS — Z79899 Other long term (current) drug therapy: Secondary | ICD-10-CM | POA: Insufficient documentation

## 2016-12-17 DIAGNOSIS — T886XXA Anaphylactic reaction due to adverse effect of correct drug or medicament properly administered, initial encounter: Secondary | ICD-10-CM | POA: Diagnosis not present

## 2016-12-17 MED ORDER — HYDROCODONE-ACETAMINOPHEN 5-325 MG PO TABS
1.0000 | ORAL_TABLET | Freq: Once | ORAL | Status: AC
  Administered 2016-12-17: 1 via ORAL
  Filled 2016-12-17: qty 1

## 2016-12-17 MED ORDER — TRAMADOL HCL 50 MG PO TABS: 50.0000 mg | ORAL_TABLET | Freq: Two times a day (BID) | ORAL | 0 refills | Status: DC

## 2016-12-17 NOTE — Discharge Instructions (Signed)
Your exam, x-ray, and CT scan are all normal following your fall at home. Take the pain medicine as needed in addition to Tylenol. Follow-up with your provider for ongoing symptoms.

## 2016-12-17 NOTE — ED Triage Notes (Signed)
Pt presents to ED from home c/o fall with back pain. Pt states she cant remember what happened but she woke up on the floor and called family . Pt only complains of pain in her back. Denies hitting head.

## 2016-12-17 NOTE — ED Provider Notes (Signed)
[facility] Emergency Department Provider Note ____________________________________________  Time seen: 1745  I have reviewed the triage vital signs and the nursing notes.  HISTORY  Chief Complaint  Fall and Back Pain  HPI Dorothy [name] is a 82 y.o. female presents to the ED for evaluation of back pain s/p a fall at home this morning. She lives alone and reportedly fell at home this morning at approximately 4:30 am, according to the patient. She is unclear as to the cause of the fall; whether she fell out of bed while asleep, or got up and fell. She is also unclear as to how long she was down. She reportedly crawled to the living room and called her grandson. He found that came over and helped the patient get up on her feet. She is accompanied at this time by her granddaughter who reports that the patient has complained about back pain since the fall. She denies any head injury, scrapes, abrasions, or lacerations. She does have some bruising to the posterior left shoulder. And reports generalized pain to the midline of the lower back. She denies any bladder or bowel incontinence, lower extremities pain or weakness, or headache pain. Her medical history significant for arthritis, A. fib, CHF, COPD, diabetes, and hypertension. She does take Eliquis daily for A. fib. She still drives and works full-time at [employer].   Past Medical History:  Diagnosis Date  . Anemia   . Arthritis    hands, knees, back  . Atrial fibrillation ([place])   . CHF (congestive heart failure) ([place])   . COPD (chronic obstructive pulmonary disease) ([place])   . Diabetes mellitus without complication ([place])   . Dyspnea   . GERD (gastroesophageal reflux disease)   . Hyperlipidemia   . Hypertension   . PONV (postoperative nausea and vomiting)   . Sleep apnea    told she needs CPAP, does not have  . Wears dentures    full upper and lower.  only wears upper    Patient Active Problem List   Diagnosis  Date Noted  . CHF (congestive heart failure) ([place]) 01/10/2015    Past Surgical History:  Procedure Laterality Date  . ABDOMINAL HYSTERECTOMY    . CATARACT EXTRACTION W/PHACO Right 07/12/2016   Procedure: CATARACT EXTRACTION PHACO AND INTRAOCULAR LENS PLACEMENT (IOC);  Surgeon: Leandrew Koyanagi, MD;  Location: [place];  Service: Ophthalmology;  Laterality: Right;  DIABETIC - insulin and oral meds sleep apnea  . CHOLECYSTECTOMY    . COLONOSCOPY N/A 01/19/2015   Procedure: COLONOSCOPY;  Surgeon: Josefine Class, MD;  Location: [facility] ENDOSCOPY;  Service: Endoscopy;  Laterality: N/A;  . ESOPHAGOGASTRODUODENOSCOPY N/A 01/19/2015   Procedure: ESOPHAGOGASTRODUODENOSCOPY (EGD);  Surgeon: Josefine Class, MD;  Location: [facility] ENDOSCOPY;  Service: Endoscopy;  Laterality: N/A;  . SHOULDER ARTHROSCOPY      Prior to Admission medications   Medication Sig Start Date End Date Taking? Authorizing Provider  acetaminophen (TYLENOL) 500 MG tablet Take 1,000 mg by mouth every 6 (six) hours as needed for moderate pain or headache.    Historical Provider, MD  albuterol (PROVENTIL HFA;VENTOLIN HFA) 108 (90 BASE) MCG/ACT inhaler Inhale 2 puffs into the lungs every 6 (six) hours as needed for wheezing or shortness of breath.    Historical Provider, MD  apixaban (ELIQUIS) 5 MG TABS tablet Take 5 mg by mouth 2 (two) times daily.    Historical Provider, MD  atorvastatin (LIPITOR) 80 MG tablet Take 80 mg by mouth daily.  Historical Provider, MD  Calcium Carb-Cholecalciferol (CALCIUM 600 + D PO) Take 1 tablet by mouth.    Historical Provider, MD  cloNIDine (CATAPRES) 0.1 MG tablet Take 0.1 mg by mouth 2 (two) times daily.    Historical Provider, MD  esomeprazole (NEXIUM) 20 MG capsule Take 20 mg by mouth at bedtime.    Historical Provider, MD  ferrous sulfate 325 (65 FE) MG tablet Take 325 mg by mouth daily with breakfast.    Historical Provider, MD  furosemide (LASIX) 20 MG tablet Take 20 mg by  mouth 2 (two) times daily.    Historical Provider, MD  glipiZIDE (GLUCOTROL XL) 10 MG 24 hr tablet Take 10 mg by mouth 2 (two) times daily.    Historical Provider, MD  insulin NPH Human (HUMULIN N,NOVOLIN N) 100 UNIT/ML injection Inject 28 Units into the skin 2 (two) times daily before a meal.     Historical Provider, MD  lisinopril (PRINIVIL,ZESTRIL) 10 MG tablet Take 10 mg by mouth daily.    Historical Provider, MD  metFORMIN (GLUMETZA) 1000 MG (MOD) 24 hr tablet Take 1,000 mg by mouth 2 (two) times daily.    Historical Provider, MD  traMADol (ULTRAM) 50 MG tablet Take 1 tablet (50 mg total) by mouth 2 (two) times daily. 12/17/16   Dannielle Karvonen Cobin [place]av[place], PA-C    Allergies Patient has no known allergies.  History reviewed. No pertinent family history.  Social History Social History  Substance Use Topics  . Smoking status: Never Smoker  . Smokeless tobacco: Never Used  . Alcohol use No    Review of Systems  Constitutional: Negative for fever. Eyes: Negative for visual changes. ENT: Negative for sore throat. Cardiovascular: Negative for chest pain. Respiratory: Negative for shortness of breath. Gastrointestinal: Negative for abdominal pain, vomiting and diarrhea. Genitourinary: Negative for dysuria. Musculoskeletal: Positive for back pain. Skin: Negative for rash. Neurological: Negative for headaches, focal weakness or numbness. ____________________________________________  PHYSICAL EXAM:  VITAL SIGNS: ED Triage Vitals  Enc Vitals Group     BP 12/17/16 1647 (!) 140/59     Pulse Rate 12/17/16 1647 86     Resp 12/17/16 1647 20     Temp 12/17/16 1647 97.5 F (36.4 C)     Temp Source 12/17/16 1647 Oral     SpO2 12/17/16 1647 100 %     Weight 12/17/16 1651 208 lb (94.3 kg)     Height 12/17/16 1651 5' 5.5" (1.664 m)     Head Circumference --      Peak Flow --      Pain Score 12/17/16 1646 9     Pain Loc --      Pain Edu? --      Excl. in [place]? --     Constitutional:  Alert and oriented. Well appearing and in no distress. Head: Normocephalic and atraumatic. No hematoma, laceration, or abrasions.  Eyes: Conjunctivae are normal. PERRL. Normal extraocular movements Ears: Canals clear. TMs intact bilaterally. Nose: No congestion/rhinorrhea/epistaxis. Neck: Supple. No thyromegaly. Cardiovascular: Normal rate, regular rhythm. Normal distal pulses. Respiratory: Normal respiratory effort. No wheezes/rales/rhonchi. Gastrointestinal: Soft and nontender. No distention. Musculoskeletal: Patient with tenderness to palpation to the midline of the lumbar sacral spine. No obvious deformity, dislocation, or acute step-off is noted. She has a chronic kyphotic deformity to the thoracic spine. Nontender with normal range of motion in all extremities.  Neurologic: Normal speech and language. No gross focal neurologic deficits are appreciated. Skin:  Skin is warm, dry and intact.  No rash noted. Psychiatric: Mood and affect are normal. Patient exhibits appropriate insight and judgment. ____________________________________________   RADIOLOGY  CT Head w/o CM IMPRESSION: No evidence of acute intracranial abnormality.  Lumbar Spine IMPRESSION: 1. No lumbar spine fracture or acute malalignment . 2. Stable chronic mild T12 vertebral compression fracture. 3. Mild-to-moderate multilevel degenerative disc disease, unchanged . 4. Bilateral facet arthropathy in the lower lumbar spine. 5. Minimal multilevel spondylolisthesis in the lumbar spine as detailed, unchanged. 6. Aortic atherosclerosis. ____________________________________________  PROCEDURES  Norco 5-325 mg PO ____________________________________________  INITIAL IMPRESSION / ASSESSMENT AND PLAN / ED COURSE  Geriatric patient with presentation for evaluation of ongoing back pain following fall out of bed this morning. The details on the cause of the fall and the duration of her incapacitation are unclear. Her  lumbar x-rays are negative for any acute process. Her head CT is also negative for any acute intracranial process. She is reassured by her exam and imaging results. She will be discharged with a prescription for Ultram to dose as needed.  ____________________________________________  FINAL CLINICAL IMPRESSION(S) / ED DIAGNOSES  Final diagnoses:  Fall in home, initial encounter  Strain of lumbar region, initial encounter  Injury of head, initial encounter      Melvenia Needles, PA-C 12/17/16 [place], MD 12/18/16 [phone]

## 2016-12-18 ENCOUNTER — Emergency Department: Payer: Medicare Other

## 2016-12-18 ENCOUNTER — Inpatient Hospital Stay
Admission: EM | Admit: 2016-12-18 | Discharge: 2016-12-22 | DRG: 915 | Disposition: A | Discharge status: Skilled Nursing Facility | Payer: Medicare Other | Attending: Internal Medicine | Admitting: Internal Medicine

## 2016-12-18 DIAGNOSIS — T783XXA Angioneurotic edema, initial encounter: Secondary | ICD-10-CM | POA: Diagnosis present

## 2016-12-18 DIAGNOSIS — Z9841 Cataract extraction status, right eye: Secondary | ICD-10-CM | POA: Diagnosis not present

## 2016-12-18 DIAGNOSIS — I482 Chronic atrial fibrillation: Secondary | ICD-10-CM | POA: Diagnosis present

## 2016-12-18 DIAGNOSIS — I509 Heart failure, unspecified: Secondary | ICD-10-CM | POA: Diagnosis not present

## 2016-12-18 DIAGNOSIS — J181 Lobar pneumonia, unspecified organism: Secondary | ICD-10-CM

## 2016-12-18 DIAGNOSIS — E669 Obesity, unspecified: Secondary | ICD-10-CM | POA: Diagnosis present

## 2016-12-18 DIAGNOSIS — J449 Chronic obstructive pulmonary disease, unspecified: Secondary | ICD-10-CM | POA: Diagnosis not present

## 2016-12-18 DIAGNOSIS — D509 Iron deficiency anemia, unspecified: Secondary | ICD-10-CM | POA: Diagnosis present

## 2016-12-18 DIAGNOSIS — T886XXA Anaphylactic reaction due to adverse effect of correct drug or medicament properly administered, initial encounter: Secondary | ICD-10-CM | POA: Diagnosis present

## 2016-12-18 DIAGNOSIS — I5033 Acute on chronic diastolic (congestive) heart failure: Secondary | ICD-10-CM | POA: Diagnosis present

## 2016-12-18 DIAGNOSIS — Z961 Presence of intraocular lens: Secondary | ICD-10-CM | POA: Diagnosis present

## 2016-12-18 DIAGNOSIS — K922 Gastrointestinal hemorrhage, unspecified: Secondary | ICD-10-CM | POA: Diagnosis present

## 2016-12-18 DIAGNOSIS — J690 Pneumonitis due to inhalation of food and vomit: Secondary | ICD-10-CM | POA: Diagnosis present

## 2016-12-18 DIAGNOSIS — Z6836 Body mass index (BMI) 36.0-36.9, adult: Secondary | ICD-10-CM | POA: Diagnosis not present

## 2016-12-18 DIAGNOSIS — I4891 Unspecified atrial fibrillation: Secondary | ICD-10-CM | POA: Diagnosis not present

## 2016-12-18 DIAGNOSIS — I110 Hypertensive heart disease with heart failure: Secondary | ICD-10-CM | POA: Diagnosis present

## 2016-12-18 DIAGNOSIS — Z90710 Acquired absence of both cervix and uterus: Secondary | ICD-10-CM

## 2016-12-18 DIAGNOSIS — T7840XA Allergy, unspecified, initial encounter: Secondary | ICD-10-CM

## 2016-12-18 DIAGNOSIS — Z79899 Other long term (current) drug therapy: Secondary | ICD-10-CM

## 2016-12-18 DIAGNOSIS — G4730 Sleep apnea, unspecified: Secondary | ICD-10-CM | POA: Diagnosis present

## 2016-12-18 DIAGNOSIS — E162 Hypoglycemia, unspecified: Secondary | ICD-10-CM

## 2016-12-18 DIAGNOSIS — J440 Chronic obstructive pulmonary disease with acute lower respiratory infection: Secondary | ICD-10-CM | POA: Diagnosis present

## 2016-12-18 DIAGNOSIS — G9341 Metabolic encephalopathy: Secondary | ICD-10-CM | POA: Diagnosis present

## 2016-12-18 DIAGNOSIS — E11649 Type 2 diabetes mellitus with hypoglycemia without coma: Secondary | ICD-10-CM | POA: Diagnosis present

## 2016-12-18 DIAGNOSIS — Z7901 Long term (current) use of anticoagulants: Secondary | ICD-10-CM | POA: Diagnosis not present

## 2016-12-18 DIAGNOSIS — D62 Acute posthemorrhagic anemia: Secondary | ICD-10-CM | POA: Diagnosis present

## 2016-12-18 DIAGNOSIS — Z794 Long term (current) use of insulin: Secondary | ICD-10-CM | POA: Diagnosis not present

## 2016-12-18 DIAGNOSIS — A419 Sepsis, unspecified organism: Secondary | ICD-10-CM | POA: Diagnosis present

## 2016-12-18 DIAGNOSIS — E538 Deficiency of other specified B group vitamins: Secondary | ICD-10-CM | POA: Diagnosis present

## 2016-12-18 DIAGNOSIS — M159 Polyosteoarthritis, unspecified: Secondary | ICD-10-CM | POA: Diagnosis present

## 2016-12-18 DIAGNOSIS — K219 Gastro-esophageal reflux disease without esophagitis: Secondary | ICD-10-CM | POA: Diagnosis present

## 2016-12-18 DIAGNOSIS — M1990 Unspecified osteoarthritis, unspecified site: Secondary | ICD-10-CM | POA: Diagnosis not present

## 2016-12-18 DIAGNOSIS — E785 Hyperlipidemia, unspecified: Secondary | ICD-10-CM | POA: Diagnosis not present

## 2016-12-18 DIAGNOSIS — I5043 Acute on chronic combined systolic (congestive) and diastolic (congestive) heart failure: Secondary | ICD-10-CM | POA: Diagnosis present

## 2016-12-18 DIAGNOSIS — T404X5A Adverse effect of other synthetic narcotics, initial encounter: Secondary | ICD-10-CM | POA: Diagnosis present

## 2016-12-18 DIAGNOSIS — E119 Type 2 diabetes mellitus without complications: Secondary | ICD-10-CM | POA: Diagnosis not present

## 2016-12-18 DIAGNOSIS — J189 Pneumonia, unspecified organism: Secondary | ICD-10-CM | POA: Diagnosis present

## 2016-12-18 DIAGNOSIS — Z9049 Acquired absence of other specified parts of digestive tract: Secondary | ICD-10-CM

## 2016-12-18 DIAGNOSIS — I10 Essential (primary) hypertension: Secondary | ICD-10-CM | POA: Diagnosis not present

## 2016-12-18 DIAGNOSIS — R195 Other fecal abnormalities: Secondary | ICD-10-CM | POA: Diagnosis present

## 2016-12-18 DIAGNOSIS — D649 Anemia, unspecified: Secondary | ICD-10-CM | POA: Diagnosis present

## 2016-12-18 LAB — CBC WITH DIFFERENTIAL/PLATELET
Basophils Absolute: 0 10*3/uL (ref 0–0.1)
Basophils Relative: 0 %
EOS ABS: 0 10*3/uL (ref 0–0.7)
Eosinophils Relative: 0 %
HCT: 25.3 % — ABNORMAL LOW (ref 35.0–47.0)
HEMOGLOBIN: 7.3 g/dL — AB (ref 12.0–16.0)
LYMPHS ABS: 1 10*3/uL (ref 1.0–3.6)
Lymphocytes Relative: 6 %
MCH: 19.8 pg — AB (ref 26.0–34.0)
MCHC: 28.9 g/dL — ABNORMAL LOW (ref 32.0–36.0)
MCV: 68.6 fL — ABNORMAL LOW (ref 80.0–100.0)
MONOS PCT: 9 %
Monocytes Absolute: 1.6 10*3/uL — ABNORMAL HIGH (ref 0.2–0.9)
NEUTROS PCT: 85 %
Neutro Abs: 14.5 10*3/uL — ABNORMAL HIGH (ref 1.4–6.5)
PLATELETS: 299 10*3/uL (ref 150–440)
RBC: 3.69 MIL/uL — ABNORMAL LOW (ref 3.80–5.20)
RDW: 18.4 % — ABNORMAL HIGH (ref 11.5–14.5)
WBC: 17.2 10*3/uL — ABNORMAL HIGH (ref 3.6–11.0)

## 2016-12-18 LAB — COMPREHENSIVE METABOLIC PANEL
ALT: 25 U/L (ref 14–54)
AST: 40 U/L (ref 15–41)
Albumin: 3.7 g/dL (ref 3.5–5.0)
Alkaline Phosphatase: 85 U/L (ref 38–126)
Anion gap: 11 (ref 5–15)
BUN: 19 mg/dL (ref 6–20)
CHLORIDE: 108 mmol/L (ref 101–111)
CO2: 22 mmol/L (ref 22–32)
CREATININE: 1.26 mg/dL — AB (ref 0.44–1.00)
Calcium: 9.2 mg/dL (ref 8.9–10.3)
GFR calc Af Amer: 46 mL/min — ABNORMAL LOW (ref >60)
GFR calc non Af Amer: 39 mL/min — ABNORMAL LOW (ref >60)
Glucose, Bld: 99 mg/dL (ref 65–99)
POTASSIUM: 4.8 mmol/L (ref 3.5–5.1)
Sodium: 141 mmol/L (ref 135–145)
Total Bilirubin: 1.5 mg/dL — ABNORMAL HIGH (ref 0.3–1.2)
Total Protein: 6.8 g/dL (ref 6.5–8.1)

## 2016-12-18 LAB — GLUCOSE, CAPILLARY
Glucose-Capillary: 110 mg/dL — ABNORMAL HIGH (ref 65–99)
Glucose-Capillary: 87 mg/dL (ref 65–99)

## 2016-12-18 LAB — TROPONIN I: TROPONIN I: 0.03 ng/mL — AB (ref <0.03)

## 2016-12-18 LAB — BRAIN NATRIURETIC PEPTIDE: B NATRIURETIC PEPTIDE 5: 682 pg/mL — AB (ref 0.0–100.0)

## 2016-12-18 LAB — LACTIC ACID, PLASMA: LACTIC ACID, VENOUS: 1.4 mmol/L (ref 0.5–1.9)

## 2016-12-18 MED ORDER — LEVOFLOXACIN IN D5W 750 MG/150ML IV SOLN
750.0000 mg | Freq: Once | INTRAVENOUS | Status: AC
  Administered 2016-12-18: 750 mg via INTRAVENOUS
  Filled 2016-12-18: qty 150

## 2016-12-18 MED ORDER — PREDNISONE 20 MG PO TABS
40.0000 mg | ORAL_TABLET | Freq: Every day | ORAL | Status: DC
Start: 2016-12-19 — End: 2016-12-22
  Administered 2016-12-19 – 2016-12-22 (×4): 40 mg via ORAL
  Filled 2016-12-18 (×4): qty 2

## 2016-12-18 MED ORDER — RACEPINEPHRINE HCL 2.25 % IN NEBU
0.5000 mL | INHALATION_SOLUTION | Freq: Once | RESPIRATORY_TRACT | Status: AC
  Administered 2016-12-18: 0.5 mL via RESPIRATORY_TRACT
  Filled 2016-12-18: qty 0.5

## 2016-12-18 MED ORDER — FUROSEMIDE 10 MG/ML IJ SOLN
60.0000 mg | Freq: Once | INTRAMUSCULAR | Status: AC
  Administered 2016-12-18: 60 mg via INTRAVENOUS
  Filled 2016-12-18: qty 8

## 2016-12-18 MED ORDER — FAMOTIDINE IN NACL 20-0.9 MG/50ML-% IV SOLN
20.0000 mg | Freq: Once | INTRAVENOUS | Status: AC
Start: 2016-12-18 — End: 2016-12-18
  Administered 2016-12-18: 20 mg via INTRAVENOUS
  Filled 2016-12-18: qty 50

## 2016-12-18 MED ORDER — FAMOTIDINE IN NACL 20-0.9 MG/50ML-% IV SOLN
20.0000 mg | Freq: Every day | INTRAVENOUS | Status: DC
  Administered 2016-12-19: 20 mg via INTRAVENOUS
  Filled 2016-12-18 (×2): qty 50

## 2016-12-18 MED ORDER — METHYLPREDNISOLONE SODIUM SUCC 125 MG IJ SOLR
60.0000 mg | Freq: Once | INTRAMUSCULAR | Status: AC
  Administered 2016-12-19: 60 mg via INTRAVENOUS
  Filled 2016-12-18: qty 2

## 2016-12-18 NOTE — ED Notes (Signed)
Dorothy [name] granddaughter: Dorothy [name] [phone]

## 2016-12-18 NOTE — Consult Note (Signed)
Patient family requested information for HCPOA.  Chaplain provided information and will pass information on to day Chaplain to follow-up once patient is admitted. Gwynn Burly 9:28 PM

## 2016-12-18 NOTE — H&P (Signed)
[facility] [place] NAME: Dorothy [name]    MR#:  [id number]  DATE OF BIRTH:  1935-05-09  DATE OF ADMISSION:  12/18/2016  PRIMARY CARE PHYSICIAN: Letta Median, MD   REQUESTING/REFERRING PHYSICIAN: Kerman Passey, MD  CHIEF COMPLAINT:   Chief Complaint  Patient presents with  . Allergic Reaction  . Hypoglycemia    HISTORY OF PRESENT ILLNESS:  Dorothy [name]  is a 82 y.o. female who presents After being found by family member in her home unresponsive. Patient states that she took tramadol last night for the first time, and then took a second dose before going to sleep. When she was found by her family member the following day she was unresponsive, making "gurgling sounds", and had significant angioedema. Here in the ED she was treated for severe allergic angioedema. Workup also indicated that she has pneumonia, exacerbation of her heart failure, and she was found to have hemoglobin 2 points lower than on recent check with mildly positive guaiac. Hospitalists were called for admission  PAST MEDICAL HISTORY:   Past Medical History:  Diagnosis Date  . Anemia   . Arthritis    hands, knees, back  . Atrial fibrillation ([place])   . CHF (congestive heart failure) ([place])   . COPD (chronic obstructive pulmonary disease) ([place])   . Diabetes mellitus without complication ([place])   . Dyspnea   . GERD (gastroesophageal reflux disease)   . Hyperlipidemia   . Hypertension   . PONV (postoperative nausea and vomiting)   . Sleep apnea    told she needs CPAP, does not have  . Wears dentures    full upper and lower.  only wears upper    PAST SURGICAL HISTORY:   Past Surgical History:  Procedure Laterality Date  . ABDOMINAL HYSTERECTOMY    . CATARACT EXTRACTION W/PHACO Right 07/12/2016   Procedure: CATARACT EXTRACTION PHACO AND INTRAOCULAR LENS PLACEMENT (IOC);  Surgeon: Leandrew Koyanagi, MD;  Location: [facility];  Service:  Ophthalmology;  Laterality: Right;  DIABETIC - insulin and oral meds sleep apnea  . CHOLECYSTECTOMY    . COLONOSCOPY N/A 01/19/2015   Procedure: COLONOSCOPY;  Surgeon: Josefine Class, MD;  Location: [facility] ENDOSCOPY;  Service: Endoscopy;  Laterality: N/A;  . ESOPHAGOGASTRODUODENOSCOPY N/A 01/19/2015   Procedure: ESOPHAGOGASTRODUODENOSCOPY (EGD);  Surgeon: Josefine Class, MD;  Location: [facility] ENDOSCOPY;  Service: Endoscopy;  Laterality: N/A;  . SHOULDER ARTHROSCOPY      SOCIAL HISTORY:   Social History  Substance Use Topics  . Smoking status: Never Smoker  . Smokeless tobacco: Never Used  . Alcohol use No    FAMILY HISTORY:  No family history on file.  DRUG ALLERGIES:   Allergies  Allergen Reactions  . Tramadol Anaphylaxis    MEDICATIONS AT HOME:   Prior to Admission medications   Medication Sig Start Date End Date Taking? Authorizing Provider  acetaminophen (TYLENOL) 500 MG tablet Take 1,000 mg by mouth every 6 (six) hours as needed for moderate pain or headache.   Yes Historical Provider, MD  albuterol (PROVENTIL HFA;VENTOLIN HFA) 108 (90 BASE) MCG/ACT inhaler Inhale 2 puffs into the lungs every 6 (six) hours as needed for wheezing or shortness of breath.   Yes Historical Provider, MD  apixaban (ELIQUIS) 5 MG TABS tablet Take 5 mg by mouth 2 (two) times daily.   Yes Historical Provider, MD  atorvastatin (LIPITOR) 80 MG tablet Take 80 mg by mouth daily.   Yes Historical Provider, MD  Calcium Carb-Cholecalciferol (CALCIUM 600 + D PO) Take 1 tablet by mouth.   Yes Historical Provider, MD  cloNIDine (CATAPRES) 0.1 MG tablet Take 0.1 mg by mouth 2 (two) times daily.   Yes Historical Provider, MD  esomeprazole (NEXIUM) 20 MG capsule Take 20 mg by mouth at bedtime.   Yes Historical Provider, MD  ferrous sulfate 325 (65 FE) MG tablet Take 325 mg by mouth daily with breakfast.   Yes Historical Provider, MD  furosemide (LASIX) 20 MG tablet Take 20 mg by mouth 2 (two) times daily.    Yes Historical Provider, MD  glipiZIDE (GLUCOTROL XL) 10 MG 24 hr tablet Take 10 mg by mouth 2 (two) times daily.   Yes Historical Provider, MD  insulin NPH Human (HUMULIN N,NOVOLIN N) 100 UNIT/ML injection Inject 28 Units into the skin 2 (two) times daily before a meal.    Yes Historical Provider, MD  lisinopril (PRINIVIL,ZESTRIL) 10 MG tablet Take 10 mg by mouth daily.   Yes Historical Provider, MD  metFORMIN (GLUMETZA) 1000 MG (MOD) 24 hr tablet Take 1,000 mg by mouth 2 (two) times daily.   Yes Historical Provider, MD  potassium chloride (K-DUR) 10 MEQ tablet Take 10 mEq by mouth 2 (two) times daily.   Yes Historical Provider, MD    REVIEW OF SYSTEMS:  Review of Systems  Constitutional: Positive for malaise/fatigue. Negative for chills, fever and weight loss.  HENT: Negative for ear pain, hearing loss and tinnitus.        Facial swelling  Eyes: Negative for blurred vision, double vision, pain and redness.  Respiratory: Positive for shortness of breath. Negative for cough and hemoptysis.   Cardiovascular: Negative for chest pain, palpitations, orthopnea and leg swelling.  Gastrointestinal: Negative for abdominal pain, constipation, diarrhea, nausea and vomiting.  Genitourinary: Negative for dysuria, frequency and hematuria.  Musculoskeletal: Negative for back pain, joint pain and neck pain.  Skin:       No acne, rash, or lesions  Neurological: Negative for dizziness, tremors, focal weakness and weakness.  Endo/Heme/Allergies: Negative for polydipsia. Does not bruise/bleed easily.  Psychiatric/Behavioral: Negative for depression. The patient is not nervous/anxious and does not have insomnia.      VITAL SIGNS:   Vitals:   12/18/16 1943 12/18/16 1944 12/18/16 2015 12/18/16 2030  BP: (!) 167/63     Pulse: (!) 108  96 100  Resp: 19  19 (!) 21  Temp: 97.5 F (36.4 C)     TempSrc: Oral     SpO2: 100%  96% 96%  Weight:  94.3 kg (208 lb)    Height:  5\' 5"  (1.651 m)     Wt Readings  from Last 3 Encounters:  12/18/16 94.3 kg (208 lb)  12/17/16 94.3 kg (208 lb)  09/03/16 93 kg (205 lb)    PHYSICAL EXAMINATION:  Physical Exam  Vitals reviewed. Constitutional: She is oriented to person, place, and time. She appears well-developed and well-nourished. No distress.  HENT:  Head: Normocephalic and atraumatic.  Mouth/Throat: Oropharynx is clear and moist.  Improving facial swelling  Eyes: Conjunctivae and EOM are normal. Pupils are equal, round, and reactive to light. No scleral icterus.  Neck: Normal range of motion. Neck supple. No JVD present. No thyromegaly present.  Cardiovascular: Normal rate, regular rhythm and intact distal pulses.  Exam reveals no gallop and no friction rub.   No murmur heard. Respiratory: Effort normal. No respiratory distress. She has no wheezes. She has rales.  ronchi  GI: Soft. Bowel  sounds are normal. She exhibits no distension. There is no tenderness.  Musculoskeletal: Normal range of motion. She exhibits no edema.  No arthritis, no gout  Lymphadenopathy:    She has no cervical adenopathy.  Neurological: She is alert and oriented to person, place, and time. No cranial nerve deficit.  No dysarthria, no aphasia  Skin: Skin is warm and dry. No rash noted. No erythema.  Psychiatric: She has a normal mood and affect. Her behavior is normal. Judgment and thought content normal.    LABORATORY PANEL:   CBC  Recent Labs Lab 12/18/16 1942  WBC 17.2*  HGB 7.3*  HCT 25.3*  PLT 299   ------------------------------------------------------------------------------------------------------------------  Chemistries   Recent Labs Lab 12/18/16 1942  NA 141  K 4.8  CL 108  CO2 22  GLUCOSE 99  BUN 19  CREATININE 1.26*  CALCIUM 9.2  AST 40  ALT 25  ALKPHOS 85  BILITOT 1.5*   ------------------------------------------------------------------------------------------------------------------  Cardiac Enzymes  Recent Labs Lab  12/18/16 1942  TROPONINI 0.03*   ------------------------------------------------------------------------------------------------------------------  RADIOLOGY:  Dg Lumbar Spine Complete  Result Date: 12/17/2016 CLINICAL DATA:  Fall.  Low back pain. EXAM: LUMBAR SPINE - COMPLETE 4+ VIEW COMPARISON:  05/23/2014 lumbar spine radiographs. FINDINGS: This report assumes 5 non rib-bearing lumbar vertebrae. Stable mild chronic T12 vertebral compression fracture. Lumbar vertebral body heights are preserved, with no fracture. Mild-to-moderate multilevel degenerative disc disease in the visualized thoracolumbar spine, most prominent at L1-2 and L5-S1, unchanged. Minimal 2 mm retrolisthesis at L1-2, L2-3 and L3-4, unchanged. No new spondylolisthesis. Bilateral facet arthropathy in the lower lumbar spine. No aggressive appearing focal osseous lesions. Abdominal aortic atherosclerosis. Surgical clips and sutures overlie the bilateral abdomen. IMPRESSION: 1. No lumbar spine fracture or acute malalignment . 2. Stable chronic mild T12 vertebral compression fracture. 3. Mild-to-moderate multilevel degenerative disc disease, unchanged . 4. Bilateral facet arthropathy in the lower lumbar spine. 5. Minimal multilevel spondylolisthesis in the lumbar spine as detailed, unchanged. 6. Aortic atherosclerosis. Electronically Signed   By: Ilona Sorrel M.D.   On: 12/17/2016 18:31   Ct Head Wo Contrast  Result Date: 12/17/2016 CLINICAL DATA:  Unwitnessed fall EXAM: CT HEAD WITHOUT CONTRAST TECHNIQUE: Contiguous axial images were obtained from the base of the skull through the vertex without intravenous contrast. COMPARISON:  None. FINDINGS: Brain: No evidence of acute infarction, hemorrhage, hydrocephalus, extra-axial collection. 10 mm calcified right frontal meningioma (coronal image 12), without underlying mass effect or vasogenic edema. No midline shift. Vascular: Intracranial atherosclerosis. Skull: Normal. Negative for fracture  or focal lesion. Sinuses/Orbits: The visualized paranasal sinuses are essentially clear. The mastoid air cells are unopacified. Other: None. IMPRESSION: No evidence of acute intracranial abnormality. Electronically Signed   By: Julian Hy M.D.   On: 12/17/2016 18:46   Dg Chest Portable 1 View  Result Date: 12/18/2016 CLINICAL DATA:  Allergic reaction and respiratory distress EXAM: PORTABLE CHEST 1 VIEW COMPARISON:  09/03/2016 FINDINGS: Cardiac shadow remains enlarged. Mild vascular congestion is noted. Mild interstitial edema is seen. Some patchy right upper lobe infiltrate is noted. No sizable effusion is seen. No bony abnormality is noted. IMPRESSION: Vascular congestion with mild interstitial edema and patchy right upper lobe infiltrate. Electronically Signed   By: Inez Catalina M.D.   On: 12/18/2016 20:13    EKG:   Orders placed or performed during the hospital encounter of 09/03/16  . ED EKG within 10 minutes  . ED EKG within 10 minutes  . EKG 12-Lead  . EKG 12-Lead  .  EKG    IMPRESSION AND PLAN:  Principal Problem:   Allergic angioedema - due to tramadol. Patient states she took one dose in the afternoon, and then took a second dose in the evening before going to bed. She is on an ACE inhibitor, but has been on this for a long time. Given the acuity of her symptoms and the conjunction with new first-time medicine, and development after second dose, we feel strongly this is certainly an allergy to tramadol. We will continue steroids and histamine blockers for treatment, patient is improving significantly. Active Problems:   Acute on chronic diastolic CHF (congestive heart failure) (HCC) - IV Lasix given in the ED, we'll continue home meds and continue to monitor. Cardiology consult   Sepsis [facility]) - this is due to her pneumonia. Unclear if this is been acquired pneumonia versus potential aspiration while she was unresponsive. IV antibiotics started. Lactic acid was within normal  limits, patient is hemodynamically stable. Cultures sent.   CAP (community acquired pneumonia) - antibiotics as above, supportive treatment   Anemia - down 2 points from last check in the system. She is mildly guaiac positive in the ED today. We'll get a GI consult.  All the records are reviewed and case discussed with ED provider. Management plans discussed with the patient and/or family.  DVT PROPHYLAXIS: Mechanical only  GI PROPHYLAXIS: PPI  ADMISSION STATUS: Inpatient  CODE STATUS: Full Code Status History    Date Active Date Inactive Code Status Order ID Comments User Context   01/09/2015  1:08 PM 01/10/2015  7:49 PM Full Code [id number]  Leonard Schwartz, RN Inpatient    Advance Directive Documentation     Most Recent Value  Type of Advance Directive  Healthcare Power of Attorney  Pre-existing out of facility DNR order (yellow form or pink MOST form)  -  "MOST" Form in Place?  -      TOTAL TIME TAKING CARE OF THIS PATIENT: 45 minutes.   C[place] [employer] FIELDING 12/18/2016, 10:51 PM  [facility]  Office  [phone]  CC: Primary care physician; Letta Median, MD  Note:  This document was prepared using Dragon voice recognition software and may include unintentional dictation errors.

## 2016-12-18 NOTE — ED Provider Notes (Signed)
[facility] Emergency Department Provider Note  Time seen: 8:15 PM  I have reviewed the triage vital signs and the nursing notes.   HISTORY  Chief Complaint Allergic Reaction and Hypoglycemia    HPI Dorothy [name] is a 82 y.o. female with a past medical history of anemia, arthritis, age of fibrillation, CHF, COPD, gastric reflux, hypertension, hyperlipidemia, presents to the emergency department with decreased responsiveness and possible allergic reaction. According to the patient as well as EMS report the patient took 2 tablets of tramadol for the first time last night. Patient's daughter found her this evening in bed largely unresponsive with swollen face and eyes. EMS arrived and found the patient to be hypoglycemic with a fingerstick in the 30s. They gave the patient D50 and she became much more arousable. Upon arrival to the emergency department the patient is awake alert oriented and answering questions. Patient does have moderate facial edema including periorbital edema. She is able to open her eyes but only slightly. Patient denies any trouble breathing. She does state a sensation of mild swelling in the back of her throat. EMS gave the patient 1 mg of IM epinephrine as well as 50 mg IV Benadryl and 125 mg of IV Solu-Medrol prior to arrival.  Past Medical History:  Diagnosis Date  . Anemia   . Arthritis    hands, knees, back  . Atrial fibrillation ([place])   . CHF (congestive heart failure) ([place])   . COPD (chronic obstructive pulmonary disease) ([place])   . Diabetes mellitus without complication ([place])   . Dyspnea   . GERD (gastroesophageal reflux disease)   . Hyperlipidemia   . Hypertension   . PONV (postoperative nausea and vomiting)   . Sleep apnea    told she needs CPAP, does not have  . Wears dentures    full upper and lower.  only wears upper    Patient Active Problem List   Diagnosis Date Noted  . CHF (congestive heart failure) ([place]) 01/10/2015     Past Surgical History:  Procedure Laterality Date  . ABDOMINAL HYSTERECTOMY    . CATARACT EXTRACTION W/PHACO Right 07/12/2016   Procedure: CATARACT EXTRACTION PHACO AND INTRAOCULAR LENS PLACEMENT (IOC);  Surgeon: Leandrew Koyanagi, MD;  Location: [facility];  Service: Ophthalmology;  Laterality: Right;  DIABETIC - insulin and oral meds sleep apnea  . CHOLECYSTECTOMY    . COLONOSCOPY N/A 01/19/2015   Procedure: COLONOSCOPY;  Surgeon: Josefine Class, MD;  Location: [facility] ENDOSCOPY;  Service: Endoscopy;  Laterality: N/A;  . ESOPHAGOGASTRODUODENOSCOPY N/A 01/19/2015   Procedure: ESOPHAGOGASTRODUODENOSCOPY (EGD);  Surgeon: Josefine Class, MD;  Location: [facility] ENDOSCOPY;  Service: Endoscopy;  Laterality: N/A;  . SHOULDER ARTHROSCOPY      Prior to Admission medications   Medication Sig Start Date End Date Taking? Authorizing Provider  acetaminophen (TYLENOL) 500 MG tablet Take 1,000 mg by mouth every 6 (six) hours as needed for moderate pain or headache.    Historical Provider, MD  albuterol (PROVENTIL HFA;VENTOLIN HFA) 108 (90 BASE) MCG/ACT inhaler Inhale 2 puffs into the lungs every 6 (six) hours as needed for wheezing or shortness of breath.    Historical Provider, MD  apixaban (ELIQUIS) 5 MG TABS tablet Take 5 mg by mouth 2 (two) times daily.    Historical Provider, MD  atorvastatin (LIPITOR) 80 MG tablet Take 80 mg by mouth daily.    Historical Provider, MD  Calcium Carb-Cholecalciferol (CALCIUM 600 + D PO) Take 1 tablet by mouth.  Historical Provider, MD  cloNIDine (CATAPRES) 0.1 MG tablet Take 0.1 mg by mouth 2 (two) times daily.    Historical Provider, MD  esomeprazole (NEXIUM) 20 MG capsule Take 20 mg by mouth at bedtime.    Historical Provider, MD  ferrous sulfate 325 (65 FE) MG tablet Take 325 mg by mouth daily with breakfast.    Historical Provider, MD  furosemide (LASIX) 20 MG tablet Take 20 mg by mouth 2 (two) times daily.    Historical Provider, MD  glipiZIDE  (GLUCOTROL XL) 10 MG 24 hr tablet Take 10 mg by mouth 2 (two) times daily.    Historical Provider, MD  insulin NPH Human (HUMULIN N,NOVOLIN N) 100 UNIT/ML injection Inject 28 Units into the skin 2 (two) times daily before a meal.     Historical Provider, MD  lisinopril (PRINIVIL,ZESTRIL) 10 MG tablet Take 10 mg by mouth daily.    Historical Provider, MD  metFORMIN (GLUMETZA) 1000 MG (MOD) 24 hr tablet Take 1,000 mg by mouth 2 (two) times daily.    Historical Provider, MD  traMADol (ULTRAM) 50 MG tablet Take 1 tablet (50 mg total) by mouth 2 (two) times daily. 12/17/16   Jenise V Bacon Menshew, PA-C    Allergies  Allergen Reactions  . Tramadol Anaphylaxis    No family history on file.  Social History Social History  Substance Use Topics  . Smoking status: Never Smoker  . Smokeless tobacco: Never Used  . Alcohol use No    Review of Systems Constitutional: Negative for fever. Cardiovascular: Negative for chest pain. Respiratory: Negative for shortness of breath. Gastrointestinal: Negative for abdominal pain Skin: Negative for rash. Patient states earlier today she was itching all over. Neurological: Negative for headache 10-point ROS otherwise negative.  ____________________________________________   PHYSICAL EXAM:  VITAL SIGNS: ED Triage Vitals  Enc Vitals Group     BP 12/18/16 1943 (!) 167/63     Pulse Rate 12/18/16 1943 (!) 108     Resp 12/18/16 1943 19     Temp 12/18/16 1943 97.5 F (36.4 C)     Temp Source 12/18/16 1943 Oral     SpO2 12/18/16 1943 100 %     Weight 12/18/16 1944 208 lb (94.3 kg)     Height 12/18/16 1944 5\' 5"  (1.651 m)     Head Circumference --      Peak Flow --      Pain Score 12/18/16 1941 0     Pain Loc --      Pain Edu? --      Excl. in [place]? --     Constitutional: Alert and oriented. Well appearing and in no distress. Eyes: Normal exam ENT   Head: Moderate facial edema including bilateral periorbital edema.   Mouth/Throat: Mucous  membranes are moist. Moderate uvular edema, otherwise appears to have a normal/patent oropharynx. No stridor. Cardiovascular: Normal rate, regular rhythm. No murmur Respiratory: Normal respiratory effort without tachypnea nor retractions. Breath sounds are clear. No wheeze. Gastrointestinal: Soft and nontender. No distention Musculoskeletal: Nontender with normal range of motion in all extremities.  Neurologic:  Normal speech and language. No gross focal neurologic deficits  Skin:  Skin is warm, dry and intact.  Psychiatric: Mood and affect are normal.   ____________________________________________   RADIOLOGY  X-ray shows vascular congestion with interstitial edema and right upper lobe pneumonia  ____________________________________________   INITIAL IMPRESSION / ASSESSMENT AND PLAN / ED COURSE  Pertinent labs & imaging results that were available during my  care of the patient were reviewed by me and considered in my medical decision making (see chart for details).  The patient presents to the emergency department with facial edema, possible allergic reaction to Ultram dose last night. Patient also found to be unresponsive by daughter with hypoglycemia. Fingerstick in the emergency department initially in the 80s, 30 minutes later is 110. Patient received D50, 25 g prior to arrival. Currently the patient is in no distress, denies any trouble breathing. Given a uvular edema we will dose racemic epinephrine via nebulizer. We'll also dose Pepcid via IV. We will continue to closely monitor the patient will checking labs and a chest x-ray. Patient's only diabetic medications appear to be insulin.  Patient's lab work has resulted showing an elevated BNP as well as an elevated white blood cell count. Patient's x-ray consistent with right upper lobe pneumonia. Patient's room air oxygen saturation currently 88%. Satting well on 2 L via nasal cannula. Given the patient's hypoxia with right upper  lobe pneumonia and elevated white blood cell count we will check blood cultures and start the patient on antibiotics. Patient will require admission to the hospital for further treatment.  Patient's hemoglobin has resulted at 7.3, which is an appreciable drop from prior labs. Rectal exam shows brown stool trace guaiac positive.  ____________________________________________   FINAL CLINICAL IMPRESSION(S) / ED DIAGNOSES  Allergic reaction Edema Hypoglycemia GI bleed   Harvest Dark, MD 12/18/16 2155

## 2016-12-18 NOTE — ED Notes (Signed)
Pt bib EMS from home w/ allergic reaction and hypoglycemia.  Per EMS pt treated yesterday at [facility] ED for fall and rx tramadol.  Pt found by family today in bed, unresponsive.  Per EMS CBG 39 and given amp D50.  Pt face swollen and red, airway not obstructed.  Pt alert at this time, able to answer questions.  Pt 88% on RA.  EMS administered 1 mg epi IM, 50 mg benadryl IV and 125 mg solumedrol IV.

## 2016-12-19 ENCOUNTER — Inpatient Hospital Stay
Admit: 2016-12-19 | Discharge: 2016-12-19 | Disposition: A | Discharge status: Home / Self Care | Payer: Medicare Other | Attending: Internal Medicine | Admitting: Internal Medicine

## 2016-12-19 ENCOUNTER — Inpatient Hospital Stay: Admit: 2016-12-19 | Payer: Medicare Other

## 2016-12-19 DIAGNOSIS — D509 Iron deficiency anemia, unspecified: Secondary | ICD-10-CM

## 2016-12-19 LAB — FOLATE: Folate: 13 ng/mL (ref >5.9)

## 2016-12-19 LAB — GLUCOSE, CAPILLARY
GLUCOSE-CAPILLARY: 300 mg/dL — AB (ref 65–99)
Glucose-Capillary: 143 mg/dL — ABNORMAL HIGH (ref 65–99)
Glucose-Capillary: 187 mg/dL — ABNORMAL HIGH (ref 65–99)
Glucose-Capillary: 293 mg/dL — ABNORMAL HIGH (ref 65–99)
Glucose-Capillary: 310 mg/dL — ABNORMAL HIGH (ref 65–99)

## 2016-12-19 LAB — RETICULOCYTES
RBC.: 3.16 MIL/uL — AB (ref 3.80–5.20)
RETIC CT PCT: 2.1 % (ref 0.4–3.1)
Retic Count, Absolute: 66.4 10*3/uL (ref 19.0–183.0)

## 2016-12-19 LAB — CBC
HCT: 21.4 % — ABNORMAL LOW (ref 35.0–47.0)
Hemoglobin: 6.4 g/dL — ABNORMAL LOW (ref 12.0–16.0)
MCH: 19.8 pg — ABNORMAL LOW (ref 26.0–34.0)
MCHC: 29.8 g/dL — AB (ref 32.0–36.0)
MCV: 66.6 fL — ABNORMAL LOW (ref 80.0–100.0)
PLATELETS: 148 10*3/uL — AB (ref 150–440)
RBC: 3.21 MIL/uL — ABNORMAL LOW (ref 3.80–5.20)
RDW: 18.1 % — AB (ref 11.5–14.5)
WBC: 5.9 10*3/uL (ref 3.6–11.0)

## 2016-12-19 LAB — BASIC METABOLIC PANEL
ANION GAP: 10 (ref 5–15)
BUN: 21 mg/dL — AB (ref 6–20)
CALCIUM: 8.9 mg/dL (ref 8.9–10.3)
CO2: 24 mmol/L (ref 22–32)
Chloride: 106 mmol/L (ref 101–111)
Creatinine, Ser: 1.1 mg/dL — ABNORMAL HIGH (ref 0.44–1.00)
GFR calc Af Amer: 54 mL/min — ABNORMAL LOW (ref >60)
GFR, EST NON AFRICAN AMERICAN: 46 mL/min — AB (ref >60)
GLUCOSE: 175 mg/dL — AB (ref 65–99)
Potassium: 4.6 mmol/L (ref 3.5–5.1)
SODIUM: 140 mmol/L (ref 135–145)

## 2016-12-19 LAB — HEMOGLOBIN AND HEMATOCRIT, BLOOD
HEMATOCRIT: 23.1 % — AB (ref 35.0–47.0)
HEMOGLOBIN: 6.9 g/dL — AB (ref 12.0–16.0)

## 2016-12-19 LAB — TSH: TSH: 0.709 u[IU]/mL (ref 0.350–4.500)

## 2016-12-19 LAB — FERRITIN: Ferritin: 9 ng/mL — ABNORMAL LOW (ref 11–307)

## 2016-12-19 LAB — ABO/RH: ABO/RH(D): A POS

## 2016-12-19 LAB — IRON AND TIBC
Iron: 13 ug/dL — ABNORMAL LOW (ref 28–170)
SATURATION RATIOS: 3 % — AB (ref 10.4–31.8)
TIBC: 426 ug/dL (ref 250–450)
UIBC: 413 ug/dL

## 2016-12-19 LAB — VITAMIN B12: Vitamin B-12: 151 pg/mL — ABNORMAL LOW (ref 180–914)

## 2016-12-19 LAB — PREPARE RBC (CROSSMATCH)

## 2016-12-19 MED ORDER — CEFTRIAXONE SODIUM 2 G IJ SOLR
2.0000 g | INTRAMUSCULAR | Status: DC
Start: 2016-12-19 — End: 2016-12-19
  Administered 2016-12-19: 2 g via INTRAVENOUS
  Filled 2016-12-19 (×2): qty 2

## 2016-12-19 MED ORDER — LISINOPRIL 10 MG PO TABS
10.0000 mg | ORAL_TABLET | Freq: Every day | ORAL | Status: DC
  Administered 2016-12-19 – 2016-12-22 (×4): 10 mg via ORAL
  Filled 2016-12-19 (×4): qty 1

## 2016-12-19 MED ORDER — ACETAMINOPHEN 325 MG PO TABS: 650.0000 mg | ORAL_TABLET | Freq: Four times a day (QID) | ORAL | Status: DC | PRN

## 2016-12-19 MED ORDER — IRON SUCROSE 20 MG/ML IV SOLN
200.0000 mg | INTRAVENOUS | Status: AC
  Administered 2016-12-19 – 2016-12-21 (×3): 200 mg via INTRAVENOUS
  Filled 2016-12-19 (×3): qty 10

## 2016-12-19 MED ORDER — CEFTRIAXONE SODIUM-DEXTROSE 2-2.22 GM-% IV SOLR
2.0000 g | INTRAVENOUS | Status: DC
  Filled 2016-12-19: qty 50

## 2016-12-19 MED ORDER — CARVEDILOL 6.25 MG PO TABS
6.2500 mg | ORAL_TABLET | Freq: Two times a day (BID) | ORAL | Status: DC
  Administered 2016-12-19 – 2016-12-22 (×6): 6.25 mg via ORAL
  Filled 2016-12-19 (×6): qty 1

## 2016-12-19 MED ORDER — CLONIDINE HCL 0.1 MG PO TABS
0.1000 mg | ORAL_TABLET | Freq: Two times a day (BID) | ORAL | Status: DC
  Administered 2016-12-19 – 2016-12-22 (×7): 0.1 mg via ORAL
  Filled 2016-12-19 (×7): qty 1

## 2016-12-19 MED ORDER — GUAIFENESIN-DM 100-10 MG/5ML PO SYRP: 5.0000 mL | ORAL_SOLUTION | ORAL | Status: DC | PRN

## 2016-12-19 MED ORDER — FUROSEMIDE 20 MG PO TABS
20.0000 mg | ORAL_TABLET | Freq: Two times a day (BID) | ORAL | Status: DC
  Administered 2016-12-19 – 2016-12-22 (×7): 20 mg via ORAL
  Filled 2016-12-19 (×7): qty 1

## 2016-12-19 MED ORDER — INSULIN ASPART 100 UNIT/ML ~~LOC~~ SOLN
0.0000 [IU] | Freq: Every day | SUBCUTANEOUS | Status: DC
Start: 2016-12-19 — End: 2016-12-22
  Administered 2016-12-19: 4 [IU] via SUBCUTANEOUS
  Administered 2016-12-20 – 2016-12-21 (×2): 3 [IU] via SUBCUTANEOUS
  Filled 2016-12-19: qty 3
  Filled 2016-12-19: qty 4
  Filled 2016-12-19: qty 3

## 2016-12-19 MED ORDER — ATORVASTATIN CALCIUM 20 MG PO TABS
80.0000 mg | ORAL_TABLET | Freq: Every evening | ORAL | Status: DC
  Administered 2016-12-19 – 2016-12-21 (×3): 80 mg via ORAL
  Filled 2016-12-19 (×3): qty 4

## 2016-12-19 MED ORDER — PIPERACILLIN-TAZOBACTAM 3.375 G IVPB
3.3750 g | Freq: Three times a day (TID) | INTRAVENOUS | Status: DC
  Administered 2016-12-19 – 2016-12-21 (×5): 3.375 g via INTRAVENOUS
  Filled 2016-12-19 (×8): qty 50

## 2016-12-19 MED ORDER — DEXTROSE 5 % IV SOLN
500.0000 mg | INTRAVENOUS | Status: DC
  Administered 2016-12-19: 500 mg via INTRAVENOUS
  Filled 2016-12-19: qty 500

## 2016-12-19 MED ORDER — ONDANSETRON HCL 4 MG PO TABS: 4.0000 mg | ORAL_TABLET | Freq: Four times a day (QID) | ORAL | Status: DC | PRN

## 2016-12-19 MED ORDER — PANTOPRAZOLE SODIUM 40 MG PO TBEC
40.0000 mg | DELAYED_RELEASE_TABLET | Freq: Every day | ORAL | Status: DC
  Administered 2016-12-19 – 2016-12-22 (×4): 40 mg via ORAL
  Filled 2016-12-19 (×4): qty 1

## 2016-12-19 MED ORDER — SODIUM CHLORIDE 0.9 % IV SOLN
Freq: Once | INTRAVENOUS | Status: AC
  Administered 2016-12-19: 13:00:00 via INTRAVENOUS

## 2016-12-19 MED ORDER — IPRATROPIUM-ALBUTEROL 0.5-2.5 (3) MG/3ML IN SOLN: 3.0000 mL | RESPIRATORY_TRACT | Status: DC | PRN

## 2016-12-19 MED ORDER — INSULIN NPH (HUMAN) (ISOPHANE) 100 UNIT/ML ~~LOC~~ SUSP
15.0000 [IU] | Freq: Two times a day (BID) | SUBCUTANEOUS | Status: DC
  Administered 2016-12-19 – 2016-12-20 (×2): 15 [IU] via SUBCUTANEOUS
  Filled 2016-12-19 (×2): qty 10

## 2016-12-19 MED ORDER — SODIUM CHLORIDE 0.9% FLUSH
3.0000 mL | Freq: Two times a day (BID) | INTRAVENOUS | Status: DC
  Administered 2016-12-19 – 2016-12-22 (×8): 3 mL via INTRAVENOUS

## 2016-12-19 MED ORDER — INSULIN ASPART 100 UNIT/ML ~~LOC~~ SOLN
0.0000 [IU] | Freq: Three times a day (TID) | SUBCUTANEOUS | Status: DC
  Administered 2016-12-19 (×2): 5 [IU] via SUBCUTANEOUS
  Administered 2016-12-19: 2 [IU] via SUBCUTANEOUS
  Administered 2016-12-20: 3 [IU] via SUBCUTANEOUS
  Administered 2016-12-20: 2 [IU] via SUBCUTANEOUS
  Administered 2016-12-20: 7 [IU] via SUBCUTANEOUS
  Administered 2016-12-21: 3 [IU] via SUBCUTANEOUS
  Administered 2016-12-21: 5 [IU] via SUBCUTANEOUS
  Administered 2016-12-21 – 2016-12-22 (×2): 3 [IU] via SUBCUTANEOUS
  Filled 2016-12-19: qty 2
  Filled 2016-12-19: qty 5
  Filled 2016-12-19: qty 3
  Filled 2016-12-19: qty 7
  Filled 2016-12-19: qty 3
  Filled 2016-12-19: qty 5
  Filled 2016-12-19: qty 3
  Filled 2016-12-19: qty 5
  Filled 2016-12-19: qty 3
  Filled 2016-12-19: qty 2

## 2016-12-19 MED ORDER — PNEUMOCOCCAL VAC POLYVALENT 25 MCG/0.5ML IJ INJ: 0.5000 mL | INJECTION | INTRAMUSCULAR | Status: DC

## 2016-12-19 MED ORDER — INSULIN NPH (HUMAN) (ISOPHANE) 100 UNIT/ML ~~LOC~~ SUSP
15.0000 [IU] | Freq: Two times a day (BID) | SUBCUTANEOUS | Status: DC
  Filled 2016-12-19 (×3): qty 10

## 2016-12-19 MED ORDER — ACETAMINOPHEN 650 MG RE SUPP: 650.0000 mg | Freq: Four times a day (QID) | RECTAL | Status: DC | PRN

## 2016-12-19 MED ORDER — ONDANSETRON HCL 4 MG/2ML IJ SOLN
4.0000 mg | Freq: Four times a day (QID) | INTRAMUSCULAR | Status: DC | PRN
Start: 2016-12-19 — End: 2016-12-22

## 2016-12-19 NOTE — Consult Note (Signed)
Dorothy Bellows MD  [address] [place], [place] [address] Phone: [phone] Fax : [phone]  Consultation  Referring Provider:   \Dr Tressia Miners Primary Care Physician:  Letta Median, MD Primary Gastroenterologist:  None          Reason for Consultation:     Anemia   Date of Admission:  12/18/2016 Date of Consultation:  12/19/2016         HPI:   OTA EBERSOLE is a 82 y.o. female admitted to the hospital last night as he was found unresponsive, treated for severe allergic angioedema in the ER , found to have a pneumonia , heartfailure and drop in Hb at 7.3 grams, MCV 68 . CBC in 08/2016 , he had a Hb of 9.1 and MCV 74 He is on Eloquis.   Appears in 01/2015 he had a colonoscopy for iron deficiency anemia and found no lesions contributing to the same . At the same time he also had an EGD which was normal except for a Bilroth 1 surgery.The plan at that time was for a capsule study to evaluate the small bowel .    She says she never had the capsule study, denies any overt blood loss from mouth,nose, rectum, anus or vaginal bleeding. No abdominal pains or change in bowel habits.  Presently she says she is and has been short of breath, having some cough and phlegm.   Past Medical History:  Diagnosis Date  . Anemia   . Arthritis    hands, knees, back  . Atrial fibrillation ([place])   . CHF (congestive heart failure) ([place])   . COPD (chronic obstructive pulmonary disease) ([place])   . Diabetes mellitus without complication ([place])   . Dyspnea   . GERD (gastroesophageal reflux disease)   . Hyperlipidemia   . Hypertension   . PONV (postoperative nausea and vomiting)   . Sleep apnea    told she needs CPAP, does not have  . Wears dentures    full upper and lower.  only wears upper    Past Surgical History:  Procedure Laterality Date  . ABDOMINAL HYSTERECTOMY    . CATARACT EXTRACTION W/PHACO Right 07/12/2016   Procedure: CATARACT EXTRACTION PHACO AND INTRAOCULAR LENS PLACEMENT (IOC);   Surgeon: Leandrew Koyanagi, MD;  Location: [facility];  Service: Ophthalmology;  Laterality: Right;  DIABETIC - insulin and oral meds sleep apnea  . CHOLECYSTECTOMY    . COLONOSCOPY N/A 01/19/2015   Procedure: COLONOSCOPY;  Surgeon: Josefine Class, MD;  Location[facility] ENDOSCOPY;  Service: Endoscopy;  Laterality: N/A;  . ESOPHAGOGASTRODUODENOSCOPY N/A 01/19/2015   Procedure: ESOPHAGOGASTRODUODENOSCOPY (EGD);  Surgeon: Josefine Class, MD;  Location[facility] ENDOSCOPY;  Service: Endoscopy;  Laterality: N/A;  . SHOULDER ARTHROSCOPY      Prior to Admission medications   Medication Sig Start Date End Date Taking? Authorizing Provider  acetaminophen (TYLENOL) 500 MG tablet Take 1,000 mg by mouth every 6 (six) hours as needed for moderate pain or headache.   Yes Historical Provider, MD  albuterol (PROVENTIL HFA;VENTOLIN HFA) 108 (90 BASE) MCG/ACT inhaler Inhale 2 puffs into the lungs every 6 (six) hours as needed for wheezing or shortness of breath.   Yes Historical Provider, MD  apixaban (ELIQUIS) 5 MG TABS tablet Take 5 mg by mouth 2 (two) times daily.   Yes Historical Provider, MD  atorvastatin (LIPITOR) 80 MG tablet Take 80 mg by mouth daily.   Yes Historical Provider, MD  Calcium Carb-Cholecalciferol (CALCIUM 600 + D PO) Take  1 tablet by mouth.   Yes Historical Provider, MD  cloNIDine (CATAPRES) 0.1 MG tablet Take 0.1 mg by mouth 2 (two) times daily.   Yes Historical Provider, MD  esomeprazole (NEXIUM) 20 MG capsule Take 20 mg by mouth at bedtime.   Yes Historical Provider, MD  ferrous sulfate 325 (65 FE) MG tablet Take 325 mg by mouth daily with breakfast.   Yes Historical Provider, MD  furosemide (LASIX) 20 MG tablet Take 20 mg by mouth 2 (two) times daily.   Yes Historical Provider, MD  glipiZIDE (GLUCOTROL XL) 10 MG 24 hr tablet Take 10 mg by mouth 2 (two) times daily.   Yes Historical Provider, MD  insulin NPH Human (HUMULIN N,NOVOLIN N) 100 UNIT/ML injection Inject 28 Units  into the skin 2 (two) times daily before a meal.    Yes Historical Provider, MD  lisinopril (PRINIVIL,ZESTRIL) 10 MG tablet Take 10 mg by mouth daily.   Yes Historical Provider, MD  metFORMIN (GLUMETZA) 1000 MG (MOD) 24 hr tablet Take 1,000 mg by mouth 2 (two) times daily.   Yes Historical Provider, MD  potassium chloride (K-DUR) 10 MEQ tablet Take 10 mEq by mouth 2 (two) times daily.   Yes Historical Provider, MD    No family history on file.   Social History  Substance Use Topics  . Smoking status: Never Smoker  . Smokeless tobacco: Never Used  . Alcohol use No    Allergies as of 12/18/2016 - Review Complete 12/18/2016  Allergen Reaction Noted  . Tramadol Anaphylaxis 12/18/2016    Review of Systems:    All systems reviewed and negative except where noted in HPI.   Physical Exam:  Vital signs in last 24 hours: Temp:  [97.5 F (36.4 C)-99 F (37.2 C)] 99 F (37.2 C) (04/10 0418) Pulse Rate:  [92-109] 92 (04/10 0418) Resp:  [19-24] 24 (04/10 0418) BP: (103-167)/(63-85) 163/85 (04/10 0418) SpO2:  [95 %-100 %] 98 % (04/10 0718) Weight:  [208 lb (94.3 kg)-220 lb 1.6 oz (99.8 kg)] 220 lb 1.6 oz (99.8 kg) (04/10 0418) Last BM Date: 12/17/16 General:   Pleasant, cooperative in NAD Head:  Normocephalic and atraumatic. Eyes:   No icterus.   Conjunctiva pink. PERRLA. Ears:  Normal auditory acuity. Neck:  Supple; no masses or thyroidomegaly Lungs: Respirations even and unlabored.B/l crackles from the infrascapular area till the bases.  Heart:  Irregularly irregular ,   Without murmur, clicks, rubs or gallops Abdomen:  Soft, nondistended, nontender. Normal bowel sounds. No appreciable masses or hepatomegaly.  No rebound or guarding.  Rectal:  Not performed. Extremities:  Without edema, cyanosis or clubbing. Neurologic:  Alert and oriented x3;  grossly normal neurologically. Psych:  Alert and cooperative. Normal affect.  LAB RESULTS:  Recent Labs  12/18/16 1942 12/19/16 0440    WBC 17.2* 5.9  HGB 7.3* 6.4*  HCT 25.3* 21.4*  PLT 299 148*   BMET  Recent Labs  12/18/16 1942 12/19/16 0440  NA 141 140  K 4.8 4.6  CL 108 106  CO2 22 24  GLUCOSE 99 175*  BUN 19 21*  CREATININE 1.26* 1.10*  CALCIUM 9.2 8.9   LFT  Recent Labs  12/18/16 1942  PROT 6.8  ALBUMIN 3.7  AST 40  ALT 25  ALKPHOS 85  BILITOT 1.5*   PT/INR No results for input(s): LABPROT, INR in the last 72 hours.  STUDIES: Dg Lumbar Spine Complete  Result Date: 12/17/2016 CLINICAL DATA:  Fall.  Low back pain. EXAM:  LUMBAR SPINE - COMPLETE 4+ VIEW COMPARISON:  05/23/2014 lumbar spine radiographs. FINDINGS: This report assumes 5 non rib-bearing lumbar vertebrae. Stable mild chronic T12 vertebral compression fracture. Lumbar vertebral body heights are preserved, with no fracture. Mild-to-moderate multilevel degenerative disc disease in the visualized thoracolumbar spine, most prominent at L1-2 and L5-S1, unchanged. Minimal 2 mm retrolisthesis at L1-2, L2-3 and L3-4, unchanged. No new spondylolisthesis. Bilateral facet arthropathy in the lower lumbar spine. No aggressive appearing focal osseous lesions. Abdominal aortic atherosclerosis. Surgical clips and sutures overlie the bilateral abdomen. IMPRESSION: 1. No lumbar spine fracture or acute malalignment . 2. Stable chronic mild T12 vertebral compression fracture. 3. Mild-to-moderate multilevel degenerative disc disease, unchanged . 4. Bilateral facet arthropathy in the lower lumbar spine. 5. Minimal multilevel spondylolisthesis in the lumbar spine as detailed, unchanged. 6. Aortic atherosclerosis. Electronically Signed   By: Ilona Sorrel M.D.   On: 12/17/2016 18:31   Ct Head Wo Contrast  Result Date: 12/17/2016 CLINICAL DATA:  Unwitnessed fall EXAM: CT HEAD WITHOUT CONTRAST TECHNIQUE: Contiguous axial images were obtained from the base of the skull through the vertex without intravenous contrast. COMPARISON:  None. FINDINGS: Brain: No evidence of  acute infarction, hemorrhage, hydrocephalus, extra-axial collection. 10 mm calcified right frontal meningioma (coronal image 12), without underlying mass effect or vasogenic edema. No midline shift. Vascular: Intracranial atherosclerosis. Skull: Normal. Negative for fracture or focal lesion. Sinuses/Orbits: The visualized paranasal sinuses are essentially clear. The mastoid air cells are unopacified. Other: None. IMPRESSION: No evidence of acute intracranial abnormality. Electronically Signed   By: Julian Hy M.D.   On: 12/17/2016 18:46   Dg Chest Portable 1 View  Result Date: 12/18/2016 CLINICAL DATA:  Allergic reaction and respiratory distress EXAM: PORTABLE CHEST 1 VIEW COMPARISON:  09/03/2016 FINDINGS: Cardiac shadow remains enlarged. Mild vascular congestion is noted. Mild interstitial edema is seen. Some patchy right upper lobe infiltrate is noted. No sizable effusion is seen. No bony abnormality is noted. IMPRESSION: Vascular congestion with mild interstitial edema and patchy right upper lobe infiltrate. Electronically Signed   By: Inez Catalina M.D.   On: 12/18/2016 20:13      Impression / Plan:   AYUSHI PLA is a 82 y.o. y/o female with h/o iron deficiency anemia , bilroth 1 surgery in the past who presents to the hospital with  Pneumonia, pulmonary edema and was found to be anemic. She has a microcytic anemia and is likely an acute on chronic anemia   Plan  1. Follow up iron studies, b12,folate, ferritin, tsh . Check Celiac serology , urine analysis.  2. If iron deficient suggest a dose of IV iron.  3. EGD+colonoscopy +/- capsule study when she is better from her pneumonia and heart failure which would most likely be as an outpatient. If she were to have overt bleeding and drop in Hb then would have to be performed as an inpatient.   I will sign off.  Please call me if any further GI concerns or questions.  We would like to thank you for the opportunity to participate in the care  of MEA OZGA.   Thank you for involving me in the care of this patient.      LOS: 1 day   Dorothy Bellows, MD  12/19/2016, 9:33 AM

## 2016-12-19 NOTE — Progress Notes (Signed)
[facility] [place] [facility] NAME: Dorothy [name]    MR#:  [id number]  DATE OF BIRTH:  October 18, 1934  SUBJECTIVE:  CHIEF COMPLAINT:   Chief Complaint  Patient presents with  . Allergic Reaction  . Hypoglycemia   - admitted with unresponsive episode-Noted to have swelling related to use of tramadol. -Hemoglobin noted to be 6.4 and receiving 1 unit transfusion today. No active GI bleeding at this time. -Also has cough and noted to have pneumonia  REVIEW OF SYSTEMS:  Review of Systems  Constitutional: Positive for malaise/fatigue. Negative for chills and fever.  HENT: Negative for congestion, ear discharge, hearing loss and nosebleeds.   Eyes: Negative for blurred vision and double vision.  Respiratory: Positive for shortness of breath. Negative for cough and wheezing.   Cardiovascular: Negative for chest pain, palpitations and leg swelling.  Gastrointestinal: Negative for abdominal pain, constipation, diarrhea, nausea and vomiting.  Genitourinary: Negative for dysuria.  Musculoskeletal: Positive for myalgias.  Neurological: Positive for weakness. Negative for dizziness, sensory change, speech change, focal weakness, seizures and headaches.  Psychiatric/Behavioral: Negative for depression.    DRUG ALLERGIES:   Allergies  Allergen Reactions  . Tramadol Anaphylaxis    VITALS:  Blood pressure (!) 152/65, pulse 77, temperature 98.5 F (36.9 C), temperature source Axillary, resp. rate (!) 22, height 5\' 5"  (1.651 m), weight 99.8 kg (220 lb 1.6 oz), SpO2 94 %.  PHYSICAL EXAMINATION:  Physical Exam  GENERAL:  82 y.o.-year-old Elderly and obese patient lying in the bed with no acute distress.  EYES: Pupils equal, round, reactive to light and accommodation. No scleral icterus. Extraocular muscles intact.  HEENT: Head atraumatic, normocephalic. Oropharynx and nasopharynx clear. Minimal facial swelling at the cheeks is improving NECK:  Supple, no  jugular venous distention. No thyroid enlargement, no tenderness.  LUNGS: Normal breath sounds bilaterally, no wheezing, rales or crepitation. No use of accessory muscles of respiration. Coarse rhonchi at the bases CARDIOVASCULAR: S1, S2 normal. No rubs, or gallops. 2/6 systolic murmur is present ABDOMEN: Soft, nontender, nondistended. Bowel sounds present. No organomegaly or mass.  EXTREMITIES: No cyanosis, or clubbing. 1+ pedal edema NEUROLOGIC: Cranial nerves II through XII are intact. Muscle strength 5/5 in all extremities. Sensation intact. Gait not checked.  PSYCHIATRIC: The patient is alert and oriented x 3.  SKIN: No obvious rash, lesion, or ulcer.    LABORATORY PANEL:   CBC  Recent Labs Lab 12/19/16 0440  WBC 5.9  HGB 6.4*  HCT 21.4*  PLT 148*   ------------------------------------------------------------------------------------------------------------------  Chemistries   Recent Labs Lab 12/18/16 1942 12/19/16 0440  NA 141 140  K 4.8 4.6  CL 108 106  CO2 22 24  GLUCOSE 99 175*  BUN 19 21*  CREATININE 1.26* 1.10*  CALCIUM 9.2 8.9  AST 40  --   ALT 25  --   ALKPHOS 85  --   BILITOT 1.5*  --    ------------------------------------------------------------------------------------------------------------------  Cardiac Enzymes  Recent Labs Lab 12/18/16 1942  TROPONINI 0.03*   ------------------------------------------------------------------------------------------------------------------  RADIOLOGY:  Dg Lumbar Spine Complete  Result Date: 12/17/2016 CLINICAL DATA:  Fall.  Low back pain. EXAM: LUMBAR SPINE - COMPLETE 4+ VIEW COMPARISON:  05/23/2014 lumbar spine radiographs. FINDINGS: This report assumes 5 non rib-bearing lumbar vertebrae. Stable mild chronic T12 vertebral compression fracture. Lumbar vertebral body heights are preserved, with no fracture. Mild-to-moderate multilevel degenerative disc disease in the visualized thoracolumbar spine, most  prominent at L1-2 and L5-S1, unchanged. Minimal 2 mm  retrolisthesis at L1-2, L2-3 and L3-4, unchanged. No new spondylolisthesis. Bilateral facet arthropathy in the lower lumbar spine. No aggressive appearing focal osseous lesions. Abdominal aortic atherosclerosis. Surgical clips and sutures overlie the bilateral abdomen. IMPRESSION: 1. No lumbar spine fracture or acute malalignment . 2. Stable chronic mild T12 vertebral compression fracture. 3. Mild-to-moderate multilevel degenerative disc disease, unchanged . 4. Bilateral facet arthropathy in the lower lumbar spine. 5. Minimal multilevel spondylolisthesis in the lumbar spine as detailed, unchanged. 6. Aortic atherosclerosis. Electronically Signed   By: Ilona Sorrel M.D.   On: 12/17/2016 18:31   Ct Head Wo Contrast  Result Date: 12/17/2016 CLINICAL DATA:  Unwitnessed fall EXAM: CT HEAD WITHOUT CONTRAST TECHNIQUE: Contiguous axial images were obtained from the base of the skull through the vertex without intravenous contrast. COMPARISON:  None. FINDINGS: Brain: No evidence of acute infarction, hemorrhage, hydrocephalus, extra-axial collection. 10 mm calcified right frontal meningioma (coronal image 12), without underlying mass effect or vasogenic edema. No midline shift. Vascular: Intracranial atherosclerosis. Skull: Normal. Negative for fracture or focal lesion. Sinuses/Orbits: The visualized paranasal sinuses are essentially clear. The mastoid air cells are unopacified. Other: None. IMPRESSION: No evidence of acute intracranial abnormality. Electronically Signed   By: Julian Hy M.D.   On: 12/17/2016 18:46   Dg Chest Portable 1 View  Result Date: 12/18/2016 CLINICAL DATA:  Allergic reaction and respiratory distress EXAM: PORTABLE CHEST 1 VIEW COMPARISON:  09/03/2016 FINDINGS: Cardiac shadow remains enlarged. Mild vascular congestion is noted. Mild interstitial edema is seen. Some patchy right upper lobe infiltrate is noted. No sizable effusion is seen.  No bony abnormality is noted. IMPRESSION: Vascular congestion with mild interstitial edema and patchy right upper lobe infiltrate. Electronically Signed   By: Inez Catalina M.D.   On: 12/18/2016 20:13    EKG:   Orders placed or performed during the hospital encounter of 09/03/16  . ED EKG within 10 minutes  . ED EKG within 10 minutes  . EKG 12-Lead  . EKG 12-Lead  . EKG    ASSESSMENT AND PLAN:   82 year old female with past medical history significant for diabetes, GERD, hypertension, chronic systolic CHF, COPD not on oxygen, chronic anemia presents to hospital secondary to an unresponsive episode.  #1 altered mental status-metabolic encephalopathy. Could be secondary to her injury with edema and reaction to tramadol. Also could be from her anemia. Ex line-mental status is back to normal at this time. -CT of the head without any acute abnormality.  #2 angioedema-received tramadol from the emergency room. Not sure if she had ever taken any narcotics before. But had facial swelling and lip swelling on presentation. Tramadol has been discontinued and added to her allergy list. -Swelling has much improved. No indication for any steroids at this time  #3 acute on chronic anemia-no history of anemia, baseline hemoglobin around 9. Dropped to 6.4. No active bleeding. Stool for guaiac is positive. -Appreciate GI consult. Recent EGD and colonoscopy from 2 years ago without any acute findings. Will need capsule endoscopy as outpatient. -Significant iron deficiency on labs. IV iron for 3 days while in the hospital and was discharged on oral iron. -1 unit packed RBC transfusion for symptomatic anemia today.  #4 chronic atrial fibrillation-rate controlled. Not on any beta blockers or calcium channel blockers at this time. Monitor. -Appreciate cardiology consult. Not a candidate for eliquis with her chronic anemia issues  #5 pneumonia-could be possibly from aspiration as well as she was completely  normal couple of days ago. Change her  antibiotics to Zosyn at this time.  #6 diabetes mellitus-on sliding scale insulin. Will start her NPH insulin  #7 DVT prophylaxis-Ted's and SCDs. No heparin products due to anemia.  Physical therapy consult     All the records are reviewed and case discussed with Care Management/Social Workerr. Management plans discussed with the patient, family and they are in agreement.  CODE STATUS: Full Code  TOTAL TIME TAKING CARE OF THIS PATIENT: 38 minutes.   POSSIBLE D/C IN 2 DAYS, DEPENDING ON CLINICAL CONDITION.   Emalea Mix M.D on 12/19/2016 at 3:19 PM  Between 7am to 6pm - Pager - [phone]  After 6pm go to [email or web address] - password [email or web address] [facility]  Office  [phone]  CC: Primary care physician; Letta Median, MD

## 2016-12-19 NOTE — Progress Notes (Signed)
Notified Dr. Tressia Miners of repeat hemoglobin of 6.9 after 1 unit of blood. Per MD make sure that pt has a CBC for tomorrow morning. No new orders received will continue to monitor.

## 2016-12-19 NOTE — Progress Notes (Signed)
Pharmacy Antibiotic Note  AIRIS BARBEE is a 82 y.o. female admitted on 12/18/2016 with pneumonia.  Pharmacy has been consulted for ceftriaxone dosing.  Plan: Rocephin 2g IV q24h   Height: 5\' 5"  (165.1 cm) Weight: 208 lb (94.3 kg) IBW/kg (Calculated) : 57  Temp (24hrs), Avg:98 F (36.7 C), Min:97.5 F (36.4 C), Max:98.5 F (36.9 C)   Recent Labs Lab 12/18/16 1942 12/18/16 2331  WBC 17.2*  --   CREATININE 1.26*  --   LATICACIDVEN  --  1.4    Estimated Creatinine Clearance: 41.1 mL/min (A) (by C-G formula based on SCr of 1.26 mg/dL (H)).    Allergies  Allergen Reactions  . Tramadol Anaphylaxis     Thank you for allowing pharmacy to be a part of this patient's care.  Tobie Lords, PharmD, BCPS Clinical Pharmacist 12/19/2016

## 2016-12-19 NOTE — Consult Note (Signed)
[facility] Cardiology  CARDIOLOGY CONSULT NOTE  Patient ID: Dorothy [name] MRN: [id number] DOB/AGE: [birth year] 82 y.o.  Admit date: 12/18/2016 Referring Physician Tressia Miners Primary Physician [facility] Primary Cardiologist Liara Holm Reason for Consultation Congestive heart failure  HPI: 82 year old female with history of congestive heart failure. The patient has known chronic atrial fibrillation, essential hypertension, and chronic diastolic congestive heart failure. Patient has known left ventricular function by 2-D echocardiogram. She was in her usual state of health until 12/17/2016 when she apparently fell out of bed. The patient went to a [facility] emergency room with chief complaint of back pain and was started on tramadol. On 12/18/2016 the patient experienced shortness of breath, decreased responsiveness, and return to the ED where she was treated for severe allergic angioedema secondary to tramadol. Chest x-ray also revealed evidence for pneumonia, and exacerbation of congestive heart failure. Admission labs were notable for marked anemia with hemoglobin and hematocrit of 6.4 and 21.4, respectively. The patient had been on Eliquis for stroke prevention, but denies GI bleeding. Today, the patient is much improved clinically, denies chest pain, shortness of breath, palpitations. She does have chronic peripheral edema.   Review of systems complete and found to be negative unless listed above     Past Medical History:  Diagnosis Date  . Anemia   . Arthritis    hands, knees, back  . Atrial fibrillation ([place])   . CHF (congestive heart failure) ([place])   . COPD (chronic obstructive pulmonary disease) ([place])   . Diabetes mellitus without complication ([place])   . Dyspnea   . GERD (gastroesophageal reflux disease)   . Hyperlipidemia   . Hypertension   . PONV (postoperative nausea and vomiting)   . Sleep apnea    told she needs CPAP, does not have  . Wears dentures    full upper and lower.  only wears upper     Past Surgical History:  Procedure Laterality Date  . ABDOMINAL HYSTERECTOMY    . CATARACT EXTRACTION W/PHACO Right 07/12/2016   Procedure: CATARACT EXTRACTION PHACO AND INTRAOCULAR LENS PLACEMENT (IOC);  Surgeon: Leandrew Koyanagi, MD;  Location: [place];  Service: Ophthalmology;  Laterality: Right;  DIABETIC - insulin and oral meds sleep apnea  . CHOLECYSTECTOMY    . COLONOSCOPY N/A 01/19/2015   Procedure: COLONOSCOPY;  Surgeon: Josefine Class, MD;  Location: [facility] ENDOSCOPY;  Service: Endoscopy;  Laterality: N/A;  . ESOPHAGOGASTRODUODENOSCOPY N/A 01/19/2015   Procedure: ESOPHAGOGASTRODUODENOSCOPY (EGD);  Surgeon: Josefine Class, MD;  Location: [facility] ENDOSCOPY;  Service: Endoscopy;  Laterality: N/A;  . SHOULDER ARTHROSCOPY      Prescriptions Prior to Admission  Medication Sig Dispense Refill Last Dose  . acetaminophen (TYLENOL) 500 MG tablet Take 1,000 mg by mouth every 6 (six) hours as needed for moderate pain or headache.   Past Week at Unknown time  . albuterol (PROVENTIL HFA;VENTOLIN HFA) 108 (90 BASE) MCG/ACT inhaler Inhale 2 puffs into the lungs every 6 (six) hours as needed for wheezing or shortness of breath.   Not Taking at Unknown time  . apixaban (ELIQUIS) 5 MG TABS tablet Take 5 mg by mouth 2 (two) times daily.   12/18/2016 at Unknown time  . atorvastatin (LIPITOR) 80 MG tablet Take 80 mg by mouth daily.   12/18/2016 at Unknown time  . Calcium Carb-Cholecalciferol (CALCIUM 600 + D PO) Take 1 tablet by mouth.   12/18/2016 at Unknown time  . cloNIDine (CATAPRES) 0.1 MG tablet Take 0.1 mg by mouth 2 (two) times  daily.   12/18/2016 at Unknown time  . esomeprazole (NEXIUM) 20 MG capsule Take 20 mg by mouth at bedtime.   12/18/2016 at Unknown time  . ferrous sulfate 325 (65 FE) MG tablet Take 325 mg by mouth daily with breakfast.   12/18/2016 at Unknown time  . furosemide (LASIX) 20 MG tablet Take 20 mg by mouth 2 (two) times daily.   12/18/2016 at Unknown time  . glipiZIDE  (GLUCOTROL XL) 10 MG 24 hr tablet Take 10 mg by mouth 2 (two) times daily.   12/18/2016 at Unknown time  . insulin NPH Human (HUMULIN N,NOVOLIN N) 100 UNIT/ML injection Inject 28 Units into the skin 2 (two) times daily before a meal.    12/18/2016 at Unknown time  . lisinopril (PRINIVIL,ZESTRIL) 10 MG tablet Take 10 mg by mouth daily.   12/18/2016 at Unknown time  . metFORMIN (GLUMETZA) 1000 MG (MOD) 24 hr tablet Take 1,000 mg by mouth 2 (two) times daily.   12/18/2016 at Unknown time  . potassium chloride (K-DUR) 10 MEQ tablet Take 10 mEq by mouth 2 (two) times daily.   12/18/2016 at Unknown time   Social History   Social History  . Marital status: Widowed    Spouse name: N/A  . Number of children: N/A  . Years of education: N/A   Occupational History  . Not on file.   Social History Main Topics  . Smoking status: Never Smoker  . Smokeless tobacco: Never Used  . Alcohol use No  . Drug use: Unknown  . Sexual activity: Not on file   Other Topics Concern  . Not on file   Social History Narrative  . No narrative on file    No family history on file.    Review of systems complete and found to be negative unless listed above      PHYSICAL EXAM  General: Well developed, well nourished, in no acute distress HEENT:  Normocephalic and atramatic Neck:  No JVD.  Lungs: Clear bilaterally to auscultation and percussion. Heart: HRRR . Normal S1 and S2 without gallops or murmurs.  Abdomen: Bowel sounds are positive, abdomen soft and non-tender  Msk:  Back normal, normal gait. Normal strength and tone for age. Extremities: No clubbing, cyanosis or edema.   Neuro: Alert and oriented X 3. Psych:  Good affect, responds appropriately  Labs:   Lab Results  Component Value Date   WBC 5.9 12/19/2016   HGB 6.4 (L) 12/19/2016   HCT 21.4 (L) 12/19/2016   MCV 66.6 (L) 12/19/2016   PLT 148 (L) 12/19/2016    Recent Labs Lab 12/18/16 1942 12/19/16 0440  NA 141 140  K 4.8 4.6  CL 108 106   CO2 22 24  BUN 19 21*  CREATININE 1.26* 1.10*  CALCIUM 9.2 8.9  PROT 6.8  --   BILITOT 1.5*  --   ALKPHOS 85  --   ALT 25  --   AST 40  --   GLUCOSE 99 175*   Lab Results  Component Value Date   CKTOTAL 43 01/10/2012   CKMB 1.1 01/10/2012   TROPONINI 0.03 (HH) 12/18/2016    Lab Results  Component Value Date   CHOL 147 01/11/2012   Lab Results  Component Value Date   HDL 34 (L) 01/11/2012   Lab Results  Component Value Date   LDLCALC 49 01/11/2012   Lab Results  Component Value Date   TRIG 319 (H) 01/11/2012   No results found for:  CHOLHDL No results found for: LDLDIRECT    Radiology: Dg Lumbar Spine Complete  Result Date: 12/17/2016 CLINICAL DATA:  Fall.  Low back pain. EXAM: LUMBAR SPINE - COMPLETE 4+ VIEW COMPARISON:  05/23/2014 lumbar spine radiographs. FINDINGS: This report assumes 5 non rib-bearing lumbar vertebrae. Stable mild chronic T12 vertebral compression fracture. Lumbar vertebral body heights are preserved, with no fracture. Mild-to-moderate multilevel degenerative disc disease in the visualized thoracolumbar spine, most prominent at L1-2 and L5-S1, unchanged. Minimal 2 mm retrolisthesis at L1-2, L2-3 and L3-4, unchanged. No new spondylolisthesis. Bilateral facet arthropathy in the lower lumbar spine. No aggressive appearing focal osseous lesions. Abdominal aortic atherosclerosis. Surgical clips and sutures overlie the bilateral abdomen. IMPRESSION: 1. No lumbar spine fracture or acute malalignment . 2. Stable chronic mild T12 vertebral compression fracture. 3. Mild-to-moderate multilevel degenerative disc disease, unchanged . 4. Bilateral facet arthropathy in the lower lumbar spine. 5. Minimal multilevel spondylolisthesis in the lumbar spine as detailed, unchanged. 6. Aortic atherosclerosis. Electronically Signed   By: Ilona Sorrel M.D.   On: 12/17/2016 18:31   Ct Head Wo Contrast  Result Date: 12/17/2016 CLINICAL DATA:  Unwitnessed fall EXAM: CT HEAD WITHOUT  CONTRAST TECHNIQUE: Contiguous axial images were obtained from the base of the skull through the vertex without intravenous contrast. COMPARISON:  None. FINDINGS: Brain: No evidence of acute infarction, hemorrhage, hydrocephalus, extra-axial collection. 10 mm calcified right frontal meningioma (coronal image 12), without underlying mass effect or vasogenic edema. No midline shift. Vascular: Intracranial atherosclerosis. Skull: Normal. Negative for fracture or focal lesion. Sinuses/Orbits: The visualized paranasal sinuses are essentially clear. The mastoid air cells are unopacified. Other: None. IMPRESSION: No evidence of acute intracranial abnormality. Electronically Signed   By: Julian Hy M.D.   On: 12/17/2016 18:46   Dg Chest Portable 1 View  Result Date: 12/18/2016 CLINICAL DATA:  Allergic reaction and respiratory distress EXAM: PORTABLE CHEST 1 VIEW COMPARISON:  09/03/2016 FINDINGS: Cardiac shadow remains enlarged. Mild vascular congestion is noted. Mild interstitial edema is seen. Some patchy right upper lobe infiltrate is noted. No sizable effusion is seen. No bony abnormality is noted. IMPRESSION: Vascular congestion with mild interstitial edema and patchy right upper lobe infiltrate. Electronically Signed   By: Inez Catalina M.D.   On: 12/18/2016 20:13    EKG:  Atrial fibrillation  ASSESSMENT AND PLAN:   1. Acute on chronic diastolic congestive heart failure, exacerbated by angioedema secondary to allergic reaction to tramadol 2. Chronic atrial fibrillation, rate controlled 3. Marked anemia  Recommendations  1. Agree with current therapy 2. Continue gentle diuresis 3. Carefully monitor renal status 4. Hold Eliquis  Signed: Isaias Cowman MD,PhD, [facility] 12/19/2016, 1:20 PM

## 2016-12-19 NOTE — Progress Notes (Signed)
Hgb of 6.4 called to Dr. Benjie Karvonen. No orders received.

## 2016-12-19 NOTE — Progress Notes (Signed)
Pharmacy Antibiotic Note  Dorothy [name] is a 82 y.o. female admitted on 12/18/2016 with angioedema from tramadol. Pharmacy has been consulted for Zosyn dosing for aspiration PNA.  Plan: Zosyn 3.375g IV q8h (4 hour infusion).  Height: 5\' 5"  (165.1 cm) Weight: 220 lb 1.6 oz (99.8 kg) IBW/kg (Calculated) : 57  Temp (24hrs), Avg:98.6 F (37 C), Min:97.5 F (36.4 C), Max:99.2 F (37.3 C)   Recent Labs Lab 12/18/16 1942 12/18/16 2331 12/19/16 0440  WBC 17.2*  --  5.9  CREATININE 1.26*  --  1.10*  LATICACIDVEN  --  1.4  --     Estimated Creatinine Clearance: 48.5 mL/min (A) (by C-G formula based on SCr of 1.1 mg/dL (H)).    Allergies  Allergen Reactions  . Tramadol Anaphylaxis    Antimicrobials this admission: Levaquin 4/9 x 1 CTX 4/10 x 1 Zosyn 4/10 >>  Dose adjustments this admission:   Microbiology results: 4/9 BCx: NGTD  Thank you for allowing pharmacy to be a part of this patient's care.  Napoleon Form 12/19/2016 3:44 PM

## 2016-12-20 LAB — CBC
HEMATOCRIT: 22.5 % — AB (ref 35.0–47.0)
HEMOGLOBIN: 6.9 g/dL — AB (ref 12.0–16.0)
MCH: 21.5 pg — AB (ref 26.0–34.0)
MCHC: 30.8 g/dL — AB (ref 32.0–36.0)
MCV: 69.8 fL — AB (ref 80.0–100.0)
Platelets: 167 10*3/uL (ref 150–440)
RBC: 3.23 MIL/uL — ABNORMAL LOW (ref 3.80–5.20)
RDW: 20 % — ABNORMAL HIGH (ref 11.5–14.5)
WBC: 6.4 10*3/uL (ref 3.6–11.0)

## 2016-12-20 LAB — GLUCOSE, CAPILLARY
GLUCOSE-CAPILLARY: 304 mg/dL — AB (ref 65–99)
GLUCOSE-CAPILLARY: 274 mg/dL — AB (ref 65–99)
Glucose-Capillary: 245 mg/dL — ABNORMAL HIGH (ref 65–99)
Glucose-Capillary: 198 mg/dL — ABNORMAL HIGH (ref 65–99)

## 2016-12-20 LAB — BASIC METABOLIC PANEL
Anion gap: 7 (ref 5–15)
BUN: 35 mg/dL — AB (ref 6–20)
CHLORIDE: 105 mmol/L (ref 101–111)
CO2: 28 mmol/L (ref 22–32)
CREATININE: 1.28 mg/dL — AB (ref 0.44–1.00)
Calcium: 8.5 mg/dL — ABNORMAL LOW (ref 8.9–10.3)
GFR calc Af Amer: 45 mL/min — ABNORMAL LOW (ref >60)
GFR calc non Af Amer: 39 mL/min — ABNORMAL LOW (ref >60)
GLUCOSE: 267 mg/dL — AB (ref 65–99)
Potassium: 4.3 mmol/L (ref 3.5–5.1)
SODIUM: 140 mmol/L (ref 135–145)

## 2016-12-20 LAB — ECHOCARDIOGRAM COMPLETE
HEIGHTINCHES: 65 in
WEIGHTICAEL: 3521.6 [oz_av]

## 2016-12-20 LAB — HEMOGLOBIN: HEMOGLOBIN: 8.5 g/dL — AB (ref 12.0–16.0)

## 2016-12-20 LAB — PREPARE RBC (CROSSMATCH)

## 2016-12-20 MED ORDER — SODIUM CHLORIDE 0.9 % IV SOLN
INTRAVENOUS | Status: DC | PRN
  Administered 2016-12-20: 13:00:00 via INTRAVENOUS

## 2016-12-20 MED ORDER — FUROSEMIDE 10 MG/ML IJ SOLN
40.0000 mg | Freq: Once | INTRAMUSCULAR | Status: AC
  Administered 2016-12-20: 40 mg via INTRAVENOUS
  Filled 2016-12-20: qty 4

## 2016-12-20 MED ORDER — SODIUM CHLORIDE 0.9 % IV SOLN
Freq: Once | INTRAVENOUS | Status: AC
  Administered 2016-12-20: 09:00:00 via INTRAVENOUS

## 2016-12-20 MED ORDER — INSULIN DETEMIR 100 UNIT/ML ~~LOC~~ SOLN
25.0000 [IU] | Freq: Two times a day (BID) | SUBCUTANEOUS | Status: DC
  Administered 2016-12-20 – 2016-12-21 (×2): 25 [IU] via SUBCUTANEOUS
  Filled 2016-12-20 (×3): qty 0.25

## 2016-12-20 MED ORDER — FAMOTIDINE 20 MG PO TABS
20.0000 mg | ORAL_TABLET | Freq: Every day | ORAL | Status: DC
  Administered 2016-12-20 – 2016-12-22 (×3): 20 mg via ORAL
  Filled 2016-12-20 (×3): qty 1

## 2016-12-20 MED ORDER — INSULIN NPH (HUMAN) (ISOPHANE) 100 UNIT/ML ~~LOC~~ SUSP: 25.0000 [IU] | Freq: Two times a day (BID) | SUBCUTANEOUS | Status: DC

## 2016-12-20 MED ORDER — CYANOCOBALAMIN 1000 MCG/ML IJ SOLN
1000.0000 ug | Freq: Once | INTRAMUSCULAR | Status: AC
  Administered 2016-12-20: 1000 ug via INTRAMUSCULAR
  Filled 2016-12-20: qty 1

## 2016-12-20 NOTE — Progress Notes (Signed)
Contact PT to notify of hgb 8.5.  Per PT unable to see this afternoon; she will be placed on priority list to be seen in a.m.

## 2016-12-20 NOTE — Progress Notes (Signed)
PT Cancellation Note  Patient Details Name: Dorothy [name] MRN: [id number] DOB: 03/20/1935   Cancelled Treatment:    Reason Eval/Treat Not Completed: Medical issues which prohibited therapy; Pt's HgB 6.9 and HCT 22.5 which are both outside hospital guidelines to participate in PT services.  Will attempt to evaluate pt at a future date/time as medically appropriate.    Blanche [place] Captain Blucher 12/20/2016, 11:06 AM

## 2016-12-20 NOTE — Discharge Instructions (Signed)
Heart Failure Clinic appointment on December 26, 2016 at 9:00am with Darylene Price, [place]. Please call [phone] to reschedule.

## 2016-12-20 NOTE — Progress Notes (Signed)
[place] [facility] [place] NAME: Dorothy [name]    MR#:  [id number]  DATE OF BIRTH:  [birth year]  SUBJECTIVE:  CHIEF COMPLAINT:   Chief Complaint  Patient presents with  . Allergic Reaction  . Hypoglycemia   - feels weaker. Hb at 6.9 after 1 unit Tx yesterday - breathing is improving, remains on 2L o2- not on home o2  REVIEW OF SYSTEMS:  Review of Systems  Constitutional: Positive for malaise/fatigue. Negative for chills and fever.  HENT: Negative for congestion, ear discharge, hearing loss and nosebleeds.   Eyes: Negative for blurred vision and double vision.  Respiratory: Positive for shortness of breath. Negative for cough and wheezing.   Cardiovascular: Negative for chest pain, palpitations and leg swelling.  Gastrointestinal: Negative for abdominal pain, constipation, diarrhea, nausea and vomiting.  Genitourinary: Negative for dysuria.  Musculoskeletal: Positive for myalgias.  Neurological: Positive for weakness. Negative for dizziness, sensory change, speech change, focal weakness, seizures and headaches.  Psychiatric/Behavioral: Negative for depression.    DRUG ALLERGIES:   Allergies  Allergen Reactions  . Tramadol Anaphylaxis    VITALS:  Blood pressure 136/66, pulse (!) 58, temperature 98.1 F (36.7 C), temperature source Oral, resp. rate 16, height 5\' 5"  (1.651 m), weight 99.8 kg (220 lb 1.6 oz), SpO2 100 %.  PHYSICAL EXAMINATION:  Physical Exam  GENERAL:  82 y.o.-year-old Elderly and obese patient lying in the bed with no acute distress.  EYES: Pupils equal, round, reactive to light and accommodation. No scleral icterus. Extraocular muscles intact.  HEENT: Head atraumatic, normocephalic. Oropharynx and nasopharynx clear. Minimal facial swelling at the cheeks is improving NECK:  Supple, no jugular venous distention. No thyroid enlargement, no tenderness.  LUNGS: Normal breath sounds bilaterally, no wheezing or crepitation.  No use of accessory muscles of respiration. Coarse rales and rhonchi at the bases CARDIOVASCULAR: S1, S2 normal. No rubs, or gallops. 2/6 systolic murmur is present ABDOMEN: Soft, nontender, nondistended. Bowel sounds present. No organomegaly or mass.  EXTREMITIES: No cyanosis, or clubbing. 1+ pedal edema NEUROLOGIC: Cranial nerves II through XII are intact. Muscle strength 5/5 in all extremities. Sensation intact. Gait not checked. Global weakness noted. PSYCHIATRIC: The patient is alert and oriented x 3.  SKIN: No obvious rash, lesion, or ulcer.    LABORATORY PANEL:   CBC  Recent Labs Lab 12/20/16 0429  WBC 6.4  HGB 6.9*  HCT 22.5*  PLT 167   ------------------------------------------------------------------------------------------------------------------  Chemistries   Recent Labs Lab 12/18/16 1942  12/20/16 0429  NA 141  < > 140  K 4.8  < > 4.3  CL 108  < > 105  CO2 22  < > 28  GLUCOSE 99  < > 267*  BUN 19  < > 35*  CREATININE 1.26*  < > 1.28*  CALCIUM 9.2  < > 8.5*  AST 40  --   --   ALT 25  --   --   ALKPHOS 85  --   --   BILITOT 1.5*  --   --   < > = values in this interval not displayed. ------------------------------------------------------------------------------------------------------------------  Cardiac Enzymes  Recent Labs Lab 12/18/16 1942  TROPONINI 0.03*   ------------------------------------------------------------------------------------------------------------------  RADIOLOGY:  Dg Chest Portable 1 View  Result Date: 12/18/2016 CLINICAL DATA:  Allergic reaction and respiratory distress EXAM: PORTABLE CHEST 1 VIEW COMPARISON:  09/03/2016 FINDINGS: Cardiac shadow remains enlarged. Mild vascular congestion is noted. Mild interstitial edema is seen. Some patchy right upper  lobe infiltrate is noted. No sizable effusion is seen. No bony abnormality is noted. IMPRESSION: Vascular congestion with mild interstitial edema and patchy right upper lobe  infiltrate. Electronically Signed   By: Inez Catalina M.D.   On: 12/18/2016 20:13    EKG:   Orders placed or performed during the hospital encounter of 09/03/16  . ED EKG within 10 minutes  . ED EKG within 10 minutes  . EKG 12-Lead  . EKG 12-Lead  . EKG    ASSESSMENT AND PLAN:   82 year old female with past medical history significant for diabetes, GERD, hypertension, chronic systolic CHF, COPD not on oxygen, chronic anemia presents to hospital secondary to an unresponsive episode.  #1 altered mental status-metabolic encephalopathy. Could be secondary to her injury with edema and reaction to tramadol. Also could be from her anemia.  -mental status is back to normal at this time. -CT of the head without any acute abnormality.  #2 angioedema-received tramadol from the emergency room. Not sure if she had ever taken any narcotics before. But had facial swelling and lip swelling on presentation. Tramadol has been discontinued and added to her allergy list. -Swelling has much improved. No indication for any steroids at this time  #3 acute on chronic anemia-no history of anemia, baseline hemoglobin around 8.5 to 9. Dropped to 6.4.  No active bleeding. Stool for guaiac is positive. -Appreciate GI consult. Recent EGD and colonoscopy from 2 years ago without any acute findings. Will need capsule endoscopy as outpatient. -Significant iron deficiency on labs. IV iron for 3 days while in the hospital and discharge on oral iron. -received 1 unit yesterday and now hb at 6.9, received 1 more unit today- f/u.  #4 chronic atrial fibrillation-rate controlled. Not on any beta blockers or calcium channel blockers at this time. Monitor. -Appreciate cardiology consult. Not a candidate for eliquis with her chronic anemia issues  #5 pneumonia-could be possibly from aspiration as well as she was completely normal couple of days ago. on Zosyn at this time.  #6 diabetes mellitus-on sliding scale insulin.  Increase her NPH insulin as sugars are elevated  #7 DVT prophylaxis-Ted's and SCDs. No heparin products due to anemia.  Physical therapy consult today- patient can work with PT if Hb >7     All the records are reviewed and case discussed with Care Management/Social Workerr. Management plans discussed with the patient, family and they are in agreement.  CODE STATUS: Full Code  TOTAL TIME TAKING CARE OF THIS PATIENT: 38 minutes.   POSSIBLE D/C IN 1-2 DAYS, DEPENDING ON CLINICAL CONDITION.   Gladstone Lighter M.D on 12/20/2016 at 1:40 PM  Between 7am to 6pm - Pager - [phone]  After 6pm go to [email or web address] - password [email or web address] [place] [facility]  Office  [phone]  CC: Primary care physician; Letta Median, MD

## 2016-12-20 NOTE — Progress Notes (Signed)
[facility] Cardiology  SUBJECTIVE: I feel better   Vitals:   12/19/16 2101 12/20/16 0442 12/20/16 0902 12/20/16 0903  BP: (!) 147/68 131/65 (!) 145/61 (!) 145/61  Pulse: 74 73 68 68  Resp: 16 18  16   Temp: 98.1 F (36.7 C) 97.9 F (36.6 C)  97.6 F (36.4 C)  TempSrc: Oral Oral  Oral  SpO2: 96% 93%    Weight:      Height:         Intake/Output Summary (Last 24 hours) at 12/20/16 0908 Last data filed at 12/20/16 0539  Gross per 24 hour  Intake             1229 ml  Output              804 ml  Net              425 ml      PHYSICAL EXAM  General: Well developed, well nourished, in no acute distress HEENT:  Normocephalic and atramatic Neck:  No JVD.  Lungs: Clear bilaterally to auscultation and percussion. Heart: HRRR . Normal S1 and S2 without gallops or murmurs.  Abdomen: Bowel sounds are positive, abdomen soft and non-tender  Msk:  Back normal, normal gait. Normal strength and tone for age. Extremities: Trace to 1+ bilateral pedal edema Neuro: Alert and oriented X 3. Psych:  Good affect, responds appropriately   LABS: Basic Metabolic Panel:  Recent Labs  12/19/16 0440 12/20/16 0429  NA 140 140  K 4.6 4.3  CL 106 105  CO2 24 28  GLUCOSE 175* 267*  BUN 21* 35*  CREATININE 1.10* 1.28*  CALCIUM 8.9 8.5*   Liver Function Tests:  Recent Labs  12/18/16 1942  AST 40  ALT 25  ALKPHOS 85  BILITOT 1.5*  PROT 6.8  ALBUMIN 3.7   No results for input(s): LIPASE, AMYLASE in the last 72 hours. CBC:  Recent Labs  12/18/16 1942 12/19/16 0440 12/19/16 1648 12/20/16 0429  WBC 17.2* 5.9  --  6.4  NEUTROABS 14.5*  --   --   --   HGB 7.3* 6.4* 6.9* 6.9*  HCT 25.3* 21.4* 23.1* 22.5*  MCV 68.6* 66.6*  --  69.8*  PLT 299 148*  --  167   Cardiac Enzymes:  Recent Labs  12/18/16 1942  TROPONINI 0.03*   BNP: Invalid input(s): POCBNP D-Dimer: No results for input(s): DDIMER in the last 72 hours. Hemoglobin A1C: No results for input(s): HGBA1C in the last 72  hours. Fasting Lipid Panel: No results for input(s): CHOL, HDL, LDLCALC, TRIG, CHOLHDL, LDLDIRECT in the last 72 hours. Thyroid Function Tests:  Recent Labs  12/19/16 1022  TSH 0.709   Anemia Panel:  Recent Labs  12/19/16 1022  VITAMINB12 151*  FOLATE 13.0  FERRITIN 9*  TIBC 426  IRON 13*  RETICCTPCT 2.1    Dg Chest Portable 1 View  Result Date: 12/18/2016 CLINICAL DATA:  Allergic reaction and respiratory distress EXAM: PORTABLE CHEST 1 VIEW COMPARISON:  09/03/2016 FINDINGS: Cardiac shadow remains enlarged. Mild vascular congestion is noted. Mild interstitial edema is seen. Some patchy right upper lobe infiltrate is noted. No sizable effusion is seen. No bony abnormality is noted. IMPRESSION: Vascular congestion with mild interstitial edema and patchy right upper lobe infiltrate. Electronically Signed   By: Inez Catalina M.D.   On: 12/18/2016 20:13     Echo Pending  TELEMETRY: Atrial fibrillation:  ASSESSMENT AND PLAN:  Principal Problem:   Allergic angioedema Active  Problems:   Acute on chronic diastolic CHF (congestive heart failure) ([place])   Sepsis ([place])   CAP (community acquired pneumonia)   Anemia    1. Acute on chronic diastolic congestive heart failure, exacerbated by angioedema secondary to allergic reaction to tramadol, improved after diuresis 2. Chronic atrial fibrillation, rate controlled 3. Marked anemia, additional transfusion pending  Recommendations  1. Agree with current therapy 2. Review 2-D echocardiogram 3. Hold Eliquis  Sign off for now, please call if any questions   Isaias Cowman, MD, PhD, [facility] 12/20/2016 9:08 AM

## 2016-12-21 ENCOUNTER — Other Ambulatory Visit: Payer: Self-pay | Admitting: *Deleted

## 2016-12-21 ENCOUNTER — Inpatient Hospital Stay: Payer: Medicare Other

## 2016-12-21 DIAGNOSIS — I10 Essential (primary) hypertension: Secondary | ICD-10-CM

## 2016-12-21 DIAGNOSIS — I509 Heart failure, unspecified: Secondary | ICD-10-CM

## 2016-12-21 DIAGNOSIS — K219 Gastro-esophageal reflux disease without esophagitis: Secondary | ICD-10-CM

## 2016-12-21 DIAGNOSIS — D509 Iron deficiency anemia, unspecified: Secondary | ICD-10-CM

## 2016-12-21 DIAGNOSIS — E119 Type 2 diabetes mellitus without complications: Secondary | ICD-10-CM

## 2016-12-21 DIAGNOSIS — E538 Deficiency of other specified B group vitamins: Secondary | ICD-10-CM

## 2016-12-21 DIAGNOSIS — E785 Hyperlipidemia, unspecified: Secondary | ICD-10-CM

## 2016-12-21 DIAGNOSIS — I4891 Unspecified atrial fibrillation: Secondary | ICD-10-CM

## 2016-12-21 DIAGNOSIS — M1990 Unspecified osteoarthritis, unspecified site: Secondary | ICD-10-CM

## 2016-12-21 DIAGNOSIS — J449 Chronic obstructive pulmonary disease, unspecified: Secondary | ICD-10-CM

## 2016-12-21 DIAGNOSIS — Z79899 Other long term (current) drug therapy: Secondary | ICD-10-CM

## 2016-12-21 DIAGNOSIS — G4730 Sleep apnea, unspecified: Secondary | ICD-10-CM

## 2016-12-21 LAB — BPAM RBC
BLOOD PRODUCT EXPIRATION DATE: 201804162359
BLOOD PRODUCT EXPIRATION DATE: 201804272359
ISSUE DATE / TIME: 201804101301
ISSUE DATE / TIME: 201804110918
UNIT TYPE AND RH: 6200
Unit Type and Rh: 9500

## 2016-12-21 LAB — TYPE AND SCREEN
ABO/RH(D): A POS
ANTIBODY SCREEN: NEGATIVE
UNIT DIVISION: 0
Unit division: 0

## 2016-12-21 LAB — BASIC METABOLIC PANEL
Anion gap: 8 (ref 5–15)
BUN: 35 mg/dL — AB (ref 6–20)
CO2: 32 mmol/L (ref 22–32)
Calcium: 8.5 mg/dL — ABNORMAL LOW (ref 8.9–10.3)
Chloride: 102 mmol/L (ref 101–111)
Creatinine, Ser: 1.3 mg/dL — ABNORMAL HIGH (ref 0.44–1.00)
GFR calc Af Amer: 44 mL/min — ABNORMAL LOW (ref >60)
GFR, EST NON AFRICAN AMERICAN: 38 mL/min — AB (ref >60)
Glucose, Bld: 220 mg/dL — ABNORMAL HIGH (ref 65–99)
Potassium: 3.9 mmol/L (ref 3.5–5.1)
SODIUM: 142 mmol/L (ref 135–145)

## 2016-12-21 LAB — GLUCOSE, CAPILLARY
GLUCOSE-CAPILLARY: 199 mg/dL — AB (ref 65–99)
GLUCOSE-CAPILLARY: 270 mg/dL — AB (ref 65–99)
GLUCOSE-CAPILLARY: 267 mg/dL — AB (ref 65–99)
Glucose-Capillary: 201 mg/dL — ABNORMAL HIGH (ref 65–99)
Glucose-Capillary: 173 mg/dL — ABNORMAL HIGH (ref 65–99)
Glucose-Capillary: 227 mg/dL — ABNORMAL HIGH (ref 65–99)

## 2016-12-21 LAB — CBC
HCT: 25.9 % — ABNORMAL LOW (ref 35.0–47.0)
Hemoglobin: 7.8 g/dL — ABNORMAL LOW (ref 12.0–16.0)
MCH: 21.2 pg — AB (ref 26.0–34.0)
MCHC: 30.1 g/dL — ABNORMAL LOW (ref 32.0–36.0)
MCV: 70.6 fL — ABNORMAL LOW (ref 80.0–100.0)
PLATELETS: 179 10*3/uL (ref 150–440)
RBC: 3.67 MIL/uL — AB (ref 3.80–5.20)
RDW: 21 % — ABNORMAL HIGH (ref 11.5–14.5)
WBC: 6.3 10*3/uL (ref 3.6–11.0)

## 2016-12-21 LAB — CELIAC DISEASE PANEL
Endomysial Ab, IgA: NEGATIVE
IgA: 112 mg/dL (ref 64–422)
Tissue Transglutaminase Ab, IgA: <2 U/mL (ref 0–3)

## 2016-12-21 MED ORDER — INSULIN DETEMIR 100 UNIT/ML ~~LOC~~ SOLN
28.0000 [IU] | Freq: Two times a day (BID) | SUBCUTANEOUS | Status: DC
  Administered 2016-12-21: 28 [IU] via SUBCUTANEOUS
  Filled 2016-12-21 (×3): qty 0.28

## 2016-12-21 MED ORDER — AMOXICILLIN-POT CLAVULANATE 875-125 MG PO TABS
1.0000 | ORAL_TABLET | Freq: Two times a day (BID) | ORAL | Status: DC
  Administered 2016-12-21 – 2016-12-22 (×3): 1 via ORAL
  Filled 2016-12-21 (×3): qty 1

## 2016-12-21 MED ORDER — POLYETHYLENE GLYCOL 3350 17 G PO PACK: 17.0000 g | PACK | Freq: Every day | ORAL | Status: DC | PRN

## 2016-12-21 MED ORDER — SENNOSIDES-DOCUSATE SODIUM 8.6-50 MG PO TABS
2.0000 | ORAL_TABLET | Freq: Two times a day (BID) | ORAL | Status: DC
  Administered 2016-12-21: 2 via ORAL
  Filled 2016-12-21 (×3): qty 2

## 2016-12-21 MED ORDER — INSULIN ASPART 100 UNIT/ML ~~LOC~~ SOLN
3.0000 [IU] | Freq: Three times a day (TID) | SUBCUTANEOUS | Status: DC
  Administered 2016-12-21: 3 [IU] via SUBCUTANEOUS
  Filled 2016-12-21: qty 3

## 2016-12-21 NOTE — Evaluation (Signed)
Physical Therapy Evaluation Patient Details Name: Dorothy [name] MRN: [id number] DOB: [birth year] Today's Date: 12/21/2016   History of Present Illness  Pt is a 82 y.o. female admited to the hospital for allergic angioma and being treated for sepsis and CAP. Pt came to the ED on 12/17/16 for a fall out of bed resulting in low back pain. Imaging showed spondylolysthesis of multipule lumbar spine and a chronic, stable mild T12 compression fracture. Pt d/c home from ED with Tramadol. Pt returned to the ED on 4/9 when pt was found in bed and unresponsive after taking Tramadol for the first time. Pt PMH includes anemia, arthritis (hands, knees, back), atrial fibrilation, CHF, COPD, DM, HLD, HTN, and sleep apnea.   Clinical Impression  Pt found in bed talking to her daughter at bedside upon entry. Pt was very pleasant and agreeable to PT evaluation. Pt lives alone in her mobile home with no family support nearby (pt's daughter lives in [place]) and was working FT at [employer] prior to admission. Pt required min assist for bed mobility and mod assist for transfers and ambulation. Pt c/o dizziness after sitting up and standing and O2 sats dropped to 85% on 0.5 L O2 after walking 2 feet to the chair. Pt tolerated evaluation and sitting exercises well. Pt will benefit from skilled PT during admission to increase strength, activity tolerance, and overall functional mobility. Recommend d/c to SNF d/t limited assistance at home and prior deficits mentioned.       Follow Up Recommendations SNF    Equipment Recommendations  Rolling walker with 5" wheels (pt reports that her daughter purchased one for her a few days ago)    Recommendations for Other Services       Precautions / Restrictions Precautions Precautions: Fall Restrictions Weight Bearing Restrictions: No      Mobility  Bed Mobility Overal bed mobility: Needs Assistance Bed Mobility: Supine to Sit     Supine to sit: Min assist;HOB elevated      General bed mobility comments: v/c's given to bring legs off the side of the bed and to push with her arms to scoot forward  Transfers Overall transfer level: Needs assistance Equipment used: Rolling walker (2 wheeled) Transfers: Sit to/from Stand Sit to Stand: Mod assist         General transfer comment: Pt given vc's to put one hand down on the bed to push and one hand on the walker; Bed elevated prior to standing to simulate home situation. Pt required significant increased time to stand and was unable to stand after cues were given one time, then pt required mod assist and v/c's to push through her arms and legs after second cues to stand were given a second time. V/c's given for pt to reach back for the arm rests before sitting with mod assist to sit d/t pt's quick decent into the chair.  Ambulation/Gait Ambulation/Gait assistance: Min assist Ambulation Distance (Feet): 2 Feet Assistive device: Rolling walker (2 wheeled) Gait Pattern/deviations: Shuffle;Step-through pattern;Narrow base of support   Gait velocity interpretation: Below normal speed for age/gender General Gait Details: pt took two steps forward and one step back to go from the bed to the chair; v/c's given for moving the RW  Stairs            Wheelchair Mobility    Modified Rankin (Stroke Patients Only)       Balance Overall balance assessment: Needs assistance Sitting-balance support: Bilateral upper extremity supported;Feet unsupported Sitting balance-Leahy Scale:  Fair Sitting balance - Comments: Pt reported mild dizziness with sitting that decreased after 3 min sitting EOB (vital signs WNL, refer to vitals)   Standing balance support: Bilateral upper extremity supported Standing balance-Leahy Scale: Poor Standing balance comment: Pt reported mild dizziness with standing that decreased only after sitting in the chair; O2 sats dropped to 85% after walking from the bed to the chair, but came back up  in 20 seconds with v/c's for pursed lip breathing                             Pertinent Vitals/Pain Pain Assessment: No/denies pain  HR remained between 71-92bpm; BP: 152/75 sitting and 155/57 sitting after walking from bed to chair; O2 sats on 0.5 L O2 remained between 92-94% in supine and sitting, but dropped to 85% on 0.5 L O2 after walking 2 feet from the bed to the chair (RN, SW, and MD notified) and O2 sats returned to 94% after 20 second and v/c's for pursed lip breathing    Home Living Family/patient expects to be discharged to:: Private residence Living Arrangements: Alone Available Help at Discharge:  (Pt's daughter lives in [place] and will be here until Sunday. Pt reports there is no one else that can come check on her.) Type of Home: Mobile home Home Access: Ramped entrance     Home Layout: One level Home Equipment: Walker - 2 wheels;Walker - 4 wheels      Prior Function Level of Independence: Independent         Comments: Pt reports that she started using her husbands rollator Sunday after her fall, but did not use any AD prior to then. 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     Hand Dominance        Extremity/Trunk Assessment   Upper Extremity Assessment Upper Extremity Assessment: Generalized weakness;RUE deficits/detail;LUE deficits/detail RUE Deficits / Details: Pt presents with strength deficits of 4-/5 for shoulder flexion and elbow flexion/extension and shoulder flexion AROM limited to 90 degrees (pt reports that she had rotator cuff surgery on that shoulder); sensation appears to be intact LUE Deficits / Details: Pt presents with strength deficits of 4-/5 for shoulder flexion and elbow flexion/extension and shoulder flexion AROM limited to grossly 110 degrees; sensation appears to be intact    Lower Extremity Assessment Lower Extremity Assessment: Generalized weakness;RLE deficits/detail;LLE deficits/detail RLE Deficits /  Details: Pt presents with strength deficits of 4/5 for hip flexion, 4-/5 for knee flexion/extension in sitting, and 5/5 for ankle DF and 4/5 for ankle PF in supine; sensation appears to be intact LLE Deficits / Details: Pt presents with strength deficits of 4/5 for hip flexion, 4-/5 for knee flexion/extension in sitting, and 5/5 for ankle DF and 4/5 for ankle PF in supine; sensation appears to be intact    Cervical / Trunk Assessment Cervical / Trunk Assessment: Normal;Kyphotic (mild kyphosis)  Communication   Communication: No difficulties  Cognition Arousal/Alertness: Awake/alert Behavior During Therapy: WFL for tasks assessed/performed Overall Cognitive Status: Within Functional Limits for tasks assessed                                 General Comments: Pt A/O x4      General Comments General comments (skin integrity, edema, etc.): Pt daughter in room at beginning of eval and stepped out after pt history was  taken and came back at end of eval. Pt reported having 3 falls in the past year, all from a bed or recliner.     Exercises General Exercises - Upper Extremity Shoulder Flexion: AROM;Both;10 reps;Seated;Strengthening (to 90 degrees) Elbow Flexion: AROM;Both;10 reps;Seated;Strengthening Elbow Extension: AROM;Strengthening;Both;10 reps;Seated General Exercises - Lower Extremity Ankle Circles/Pumps: AROM;Both;10 reps;Supine Short Arc Quad: AROM;Both;10 reps;Seated;Strengthening Hip Flexion/Marching: AROM;Both;10 reps;Seated;Strengthening   Assessment/Plan    PT Assessment Patient needs continued PT services  PT Problem List Decreased strength;Decreased mobility;Decreased range of motion;Decreased activity tolerance;Cardiopulmonary status limiting activity;Decreased balance;Decreased knowledge of use of DME       PT Treatment Interventions DME instruction;Therapeutic activities;Gait training;Therapeutic exercise;Patient/family education;Balance training;Functional  mobility training    PT Goals (Current goals can be found in the Care Plan section)  Acute Rehab PT Goals Patient Stated Goal: to go home and get back to work PT Goal Formulation: With patient Time For Goal Achievement: 01/04/17 Potential to Achieve Goals: Good    Frequency Min 2X/week   Barriers to discharge Decreased caregiver support      Co-evaluation               End of Session Equipment Utilized During Treatment: Gait belt Activity Tolerance: Patient tolerated treatment well;Other (comment) (pt limitted by dizzines and O2 sats dropping during eval (RN, SW, and MD notified)) Patient left: in chair;with chair alarm set;with family/visitor present;with SCD's reapplied;with call bell/phone within reach Nurse Communication: Mobility status (O2 sats droping (see vitals) and dizziness) PT Visit Diagnosis: History of falling (Z91.81);Muscle weakness (generalized) (M62.81);Other abnormalities of gait and mobility (R26.89);Dizziness and giddiness (R42)    Time: 3817-7116 PT Time Calculation (min) (ACUTE ONLY): 46 min   Charges:         PT G Codes:         Maila Dukes, SPT 12/21/2016, 10:45 AM

## 2016-12-21 NOTE — Progress Notes (Signed)
[facility] [place] NAME: Dorothy [name]    MR#:  [id number]  DATE OF BIRTH:  03/23/1935  SUBJECTIVE:  CHIEF COMPLAINT:   Chief Complaint  Patient presents with  . Allergic Reaction  . Hypoglycemia   -Hemoglobin at 7.8 today. Received 1 unit packed RBC transfusion yesterday. -Working with physical therapy and felt dizzy when she got up. Still requiring 0.5 L of oxygen  REVIEW OF SYSTEMS:  Review of Systems  Constitutional: Positive for malaise/fatigue. Negative for chills and fever.  HENT: Negative for congestion, ear discharge, hearing loss and nosebleeds.   Eyes: Negative for blurred vision and double vision.  Respiratory: Positive for shortness of breath. Negative for cough and wheezing.   Cardiovascular: Negative for chest pain, palpitations and leg swelling.  Gastrointestinal: Negative for abdominal pain, constipation, diarrhea, nausea and vomiting.  Genitourinary: Negative for dysuria.  Musculoskeletal: Positive for myalgias.  Neurological: Positive for weakness. Negative for dizziness, sensory change, speech change, focal weakness, seizures and headaches.  Psychiatric/Behavioral: Negative for depression.    DRUG ALLERGIES:   Allergies  Allergen Reactions  . Tramadol Anaphylaxis    VITALS:  Blood pressure (!) 152/59, pulse 65, temperature 97.7 F (36.5 C), temperature source Oral, resp. rate 20, height 5\' 5"  (1.651 m), weight 99 kg (218 lb 3.2 oz), SpO2 96 %.  PHYSICAL EXAMINATION:  Physical Exam  GENERAL:  82 y.o.-year-old Elderly and obese patient lying in the bed with no acute distress.  EYES: Pupils equal, round, reactive to light and accommodation. No scleral icterus. Extraocular muscles intact.  HEENT: Head atraumatic, normocephalic. Oropharynx and nasopharynx clear. Minimal facial swelling at the cheeks is improving NECK:  Supple, no jugular venous distention. No thyroid enlargement, no tenderness.  LUNGS:  Normal breath sounds bilaterally, no wheezing or crepitation. No use of accessory muscles of respiration. Improving rhonchi at the bases CARDIOVASCULAR: S1, S2 normal. No rubs, or gallops. 2/6 systolic murmur is present ABDOMEN: Soft, nontender, nondistended. Bowel sounds present. No organomegaly or mass.  EXTREMITIES: No cyanosis, or clubbing. 1+ pedal edema NEUROLOGIC: Cranial nerves II through XII are intact. Muscle strength 5/5 in all extremities. Sensation intact. Gait not checked. Global weakness noted. PSYCHIATRIC: The patient is alert and oriented x 3.  SKIN: No obvious rash, lesion, or ulcer.    LABORATORY PANEL:   CBC  Recent Labs Lab 12/21/16 0519  WBC 6.3  HGB 7.8*  HCT 25.9*  PLT 179   ------------------------------------------------------------------------------------------------------------------  Chemistries   Recent Labs Lab 12/18/16 1942  12/21/16 0519  NA 141  < > 142  K 4.8  < > 3.9  CL 108  < > 102  CO2 22  < > 32  GLUCOSE 99  < > 220*  BUN 19  < > 35*  CREATININE 1.26*  < > 1.30*  CALCIUM 9.2  < > 8.5*  AST 40  --   --   ALT 25  --   --   ALKPHOS 85  --   --   BILITOT 1.5*  --   --   < > = values in this interval not displayed. ------------------------------------------------------------------------------------------------------------------  Cardiac Enzymes  Recent Labs Lab 12/18/16 1942  TROPONINI 0.03*   ------------------------------------------------------------------------------------------------------------------  RADIOLOGY:  No results found.  EKG:   Orders placed or performed during the hospital encounter of 09/03/16  . ED EKG within 10 minutes  . ED EKG within 10 minutes  . EKG 12-Lead  . EKG 12-Lead  .  EKG    ASSESSMENT AND PLAN:   82 year old female with past medical history significant for diabetes, GERD, hypertension, chronic systolic CHF, COPD not on oxygen, chronic anemia presents to hospital secondary to an  unresponsive episode.  #1 acute on chronic anemia-no history of anemia, baseline hemoglobin around 8.5 to 9. Dropped to 6.4.  No active bleeding. Stool for guaiac is positive. -Appreciate GI consult. Recent EGD and colonoscopy from 2 years ago without any acute findings. Will need capsule endoscopy as outpatient. -Significant iron deficiency on labs. IV iron for 3 days while in the hospital and discharge on oral iron. -Received total 2 units of packed RBC this admission and hemoglobin is at 7.8. -Hematology follow-up today.  #2 angioedema-received tramadol from the emergency room. Not sure if she had ever taken any narcotics before. But had facial swelling and lip swelling on presentation. Tramadol has been discontinued and added to her allergy list. -Swelling has much improved. No indication for any steroids at this time  #3 altered mental status-metabolic encephalopathy. Could be secondary to her injury with edema and reaction to tramadol. Also could be from her anemia.  -mental status is back to normal at this time. -CT of the head without any acute abnormality.  #4 chronic atrial fibrillation-rate controlled. Not on any beta blockers or calcium channel blockers at this time. Monitor. -Appreciate cardiology consult. Not a candidate for eliquis with her chronic anemia issues  #5 pneumonia-could be possibly from aspiration as well as she was completely normal couple of days ago. on Zosyn at this time. Change to Augmentin today. Encourage incentive spirometer. On oral prednisone for reactive airway disease. Can be tapered  #6 diabetes mellitus-on sliding scale insulin. Increase her NPH insulin as sugars are elevated  #7 DVT prophylaxis-Ted's and SCDs. No heparin products due to anemia.  Physical therapy consulted Will need rehabilitation at discharge. Daughter updated at bedside     All the records are reviewed and case discussed with Care Management/Social Workerr. Management plans  discussed with the patient, family and they are in agreement.  CODE STATUS: Full Code  TOTAL TIME TAKING CARE OF THIS PATIENT: 38 minutes.   POSSIBLE D/C tomorrow, DEPENDING ON CLINICAL CONDITION.   Gladstone Lighter M.D on 12/21/2016 at 10:56 AM  Between 7am to 6pm - Pager - [phone]  After 6pm go to [email or web address] - password [email or web address] [facility]  Office  [phone]  CC: Primary care physician; Letta Median, MD

## 2016-12-21 NOTE — NC FL2 (Signed)
[place] LEVEL OF CARE SCREENING TOOL     IDENTIFICATION  Patient Name: Dorothy [name] Birthdate: May 19, 1935 Sex: female Admission Date (Current Location): 12/18/2016  [place] and [place] Number:  Engineering geologist and Address:  [facility], [address] [place], [place] [address]      Provider Number: [id number]  Attending Physician Name and Address:  Gladstone Lighter, MD  Relative Name and Phone Number:       Current Level of Care: Hospital Recommended Level of Care: [place] Prior Approval Number:    Date Approved/Denied:   PASRR Number: [id number]  Discharge Plan: SNF    Current Diagnoses: Patient Active Problem List   Diagnosis Date Noted  . 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 Sepsis ([place]) 12/18/2016  . CAP (community acquired pneumonia) 12/18/2016  . Anemia 12/18/2016  . Acute on chronic diastolic CHF (congestive heart failure) ([place]) 01/10/2015    Orientation RESPIRATION BLADDER Height & Weight     Self, Time, Situation, Place  Normal, O2 (1 liter) Continent Weight: 218 lb 3.2 oz (99 kg) Height:  5\' 5"  (165.1 cm)  BEHAVIORAL SYMPTOMS/MOOD NEUROLOGICAL BOWEL NUTRITION STATUS   (none)  (none) Continent Diet  AMBULATORY STATUS COMMUNICATION OF NEEDS Skin   Limited Assist Verbally Normal                       Personal Care Assistance Level of Assistance  Bathing, Dressing, Feeding Bathing Assistance: Independent Feeding assistance: Independent Dressing Assistance: Independent     Functional Limitations Info   (no issues)          SPECIAL CARE FACTORS FREQUENCY  PT (By licensed PT)                    Contractures Contractures Info: Not present    Additional Factors Info  Code Status, Allergies Code Status Info: full Allergies Info: tramadol           Current Medications (12/21/2016):  This is the current hospital active medication list Current Facility-Administered  Medications  Medication Dose Route Frequency Provider Last Rate Last Dose  . 0.9 %  sodium chloride infusion   Intravenous PRN Gladstone Lighter, MD 10 mL/hr at 12/20/16 1329    . acetaminophen (TYLENOL) tablet 650 mg  650 mg Oral Q6H PRN Lance Coon, MD       Or  . acetaminophen (TYLENOL) suppository 650 mg  650 mg Rectal Q6H PRN Lance Coon, MD      . amoxicillin-clavulanate (AUGMENTIN) 875-125 MG per tablet 1 tablet  1 tablet Oral Q12H Gladstone Lighter, MD      . atorvastatin (LIPITOR) tablet 80 mg  80 mg Oral QPM Lance Coon, MD   80 mg at 12/20/16 1721  . carvedilol (COREG) tablet 6.25 mg  6.25 mg Oral BID WC Gladstone Lighter, MD   6.25 mg at 12/21/16 0953  . cloNIDine (CATAPRES) tablet 0.1 mg  0.1 mg Oral BID Lance Coon, MD   Stopped at 12/21/16 1111  . famotidine (PEPCID) tablet 20 mg  20 mg Oral Daily Gladstone Lighter, MD   Stopped at 12/21/16 1111  . furosemide (LASIX) tablet 20 mg  20 mg Oral BID Lance Coon, MD   20 mg at 12/21/16 3500  . guaiFENesin-dextromethorphan (ROBITUSSIN DM) 100-10 MG/5ML syrup 5 mL  5 mL Oral Q4H PRN Lance Coon, MD      . insulin aspart (novoLOG) injection 0-5 Units  0-5 Units Subcutaneous  QHS Lance Coon, MD   3 Units at 12/20/16 2150  . insulin aspart (novoLOG) injection 0-9 Units  0-9 Units Subcutaneous TID WC Lance Coon, MD   3 Units at 12/21/16 [phone]  . insulin detemir (LEVEMIR) injection 28 Units  28 Units Subcutaneous BID AC & HS Gladstone Lighter, MD      . ipratropium-albuterol (DUONEB) 0.5-2.5 (3) MG/3ML nebulizer solution 3 mL  3 mL Nebulization Q4H PRN Lance Coon, MD      . iron sucrose (VENOFER) 200 mg in sodium chloride 0.9 % 150 mL IVPB  200 mg Intravenous Q24H Gladstone Lighter, MD   200 mg at 12/20/16 1720  . lisinopril (PRINIVIL,ZESTRIL) tablet 10 mg  10 mg Oral Daily Lance Coon, MD   Stopped at 12/21/16 1111  . ondansetron (ZOFRAN) tablet 4 mg  4 mg Oral Q6H PRN Lance Coon, MD       Or  . ondansetron [facility]) injection 4  mg  4 mg Intravenous Q6H PRN Lance Coon, MD      . pantoprazole (PROTONIX) EC tablet 40 mg  40 mg Oral Daily Lance Coon, MD   Stopped at 12/21/16 1111  . pneumococcal 23 valent vaccine (PNU-IMMUNE) injection 0.5 mL  0.5 mL Intramuscular Tomorrow-1000 Lance Coon, MD      . polyethylene glycol (MIRALAX / GLYCOLAX) packet 17 g  17 g Oral Daily PRN Gladstone Lighter, MD      . predniSONE (DELTASONE) tablet 40 mg  40 mg Oral Q breakfast Lance Coon, MD   40 mg at 12/21/16 0952  . senna-docusate (Senokot-S) tablet 2 tablet  2 tablet Oral BID Gladstone Lighter, MD   Stopped at 12/21/16 1111  . sodium chloride flush (NS) 0.9 % injection 3 mL  3 mL Intravenous Q12H Lance Coon, MD   Stopped at 12/21/16 1111     Discharge Medications: Please see discharge summary for a list of discharge medications.  Relevant Imaging Results:  Relevant Lab Results:   Additional Information ss: [id number]  [name], LCSW

## 2016-12-21 NOTE — Clinical Social Work Note (Signed)
Clinical Social Work Assessment  Patient Details  Name: Dorothy [name] MRN: [id number] Date of Birth: Feb 27, 1935  Date of referral:  12/21/16               Reason for consult:  Facility Placement                Permission sought to share information with:  Facility Sport and exercise psychologist, Family Supports Permission granted to share information::  Yes, Verbal Permission Granted  Name::        Agency::     Relationship::     Contact Information:     Housing/Transportation Living arrangements for the past 2 months:  Mobile Home Source of Information:  Patient Patient Interpreter Needed:  None Criminal Activity/Legal Involvement Pertinent to Current Situation/Hospitalization:  No - Comment as needed Significant Relationships:  Siblings Advertising account executive) Lives with:  Self Do you feel safe going back to the place where you live?  Yes Need for family participation in patient care:  No (Coment)  Care giving concerns:  Patient resides alone but has a sister and granddaughter that are local.   Facilities manager / plan:  CSW informed by physician and by PT that PT is recommending STR. CSW spoke with patient and explained role and purpose of visit. Patient states that she would rather return home but if she will get better by going to a facility then she is willing to go. Patient works full time as a Therapist, sports. Patient is outgoing and tries to stay active. Patient states she would like [facility] because her sister has had to go there in the past. Bedsearch initiated.  Employment status:   (works full time as a Therapist, sports) Nurse, adult PT Recommendations:  [place] / Referral to community resources:     Patient/Family's Response to care:  Patient expressed appreciation for CSW assistance.  Patient/Family's Understanding of and Emotional Response to Diagnosis, Current Treatment, and Prognosis:  Patient is aware of her  limitations and abilities and is hopeful she will benefit from STR.  Emotional Assessment Appearance:  Appears stated age Attitude/Demeanor/Rapport:   (pleasant and cooperative) Affect (typically observed):  Accepting, Adaptable, Calm, Pleasant Orientation:  Oriented to Self, Oriented to Place, Oriented to  Time, Oriented to Situation Alcohol / Substance use:  Not Applicable Psych involvement (Current and /or in the community):  No (Comment)  Discharge Needs  Concerns to be addressed:  Care Coordination Readmission within the last 30 days:  No Current discharge risk:  None Barriers to Discharge:  No Barriers Identified   [name], LCSW 12/21/2016, 12:13 PM

## 2016-12-21 NOTE — Progress Notes (Signed)
Notified dr.pyreddy of 2.2 pause. Acknowledged and no new orders placed.

## 2016-12-21 NOTE — Progress Notes (Signed)
Inpatient Diabetes Program Recommendations  AACE/ADA: New Consensus Statement on Inpatient Glycemic Control (2015)  Target Ranges:  Prepandial:   less than 140 mg/dL      Peak postprandial:   less than 180 mg/dL (1-2 hours)      Critically ill patients:  140 - 180 mg/dL   Lab Results  Component Value Date   GLUCAP 201 (H) 12/21/2016   HGBA1C 6.7 (H) 01/10/2015    Review of Glycemic Control  Results for KALISHA, KEADLE (MRN [id number]) as of 12/21/2016 09:43  Ref. Range 12/20/2016 07:31 12/20/2016 11:21 12/20/2016 16:34 12/20/2016 21:43 12/21/2016 08:32  Glucose-Capillary Latest Ref Range: 65 - 99 mg/dL 245 (H) 198 (H) 304 (H) 274 (H) 201 (H)    Diabetes history: Type 2 Outpatient Diabetes medications: Metformin 1000mg  bid, NPH 28 units bid, Glipizide 10mg  bid  Current orders for Inpatient glycemic control: Levemir 25 units bis, Novolog 0-9 units tid, Novolog 0-5 units qhs,  * steroids 40mg  qam   Inpatient Diabetes Program Recommendations:  Elevated CBG this am- received Levemir 25 units as ordered last night.   If steroids are to continue, consider increasing Levemir to 28 units bid (home dose) and add Novolog 3 units tid with meals.   Gentry Fitz, RN, BA, MHA, CDE Diabetes Coordinator Inpatient Diabetes Program  [phone] (Team Pager) [phone] ([place]) 12/21/2016 9:54 AM

## 2016-12-21 NOTE — Progress Notes (Signed)
Hematology/Oncology Consult note [facility] Telephone:([phone] [phone]  Patient Care Team: Letta Median, MD as PCP - General   Name of the patient: Dorothy [name]  [id number]  [birth year]    Reason for consult: iron deficiency anemia   Requesting physician: Dr. Tressia Miners  Date of visit: 12/21/2016    History of presenting illness- patient is a 82 year old female who was seen by Dr. Nani Skillern 2016) deficiency anemia. At that time she had an EGD and a colonoscopy which did not reveal any evidence of bleeding. She didn't get better with oral iron and was eventually asked to follow-up with her primary care doctor. Patient was admitted to the hospital for severe allergy angioedema and was found to have a pneumonia and heart failure. Her hemoglobin was 7.3 with an MCV of 68. She has received 2 doses of benefit for 200 mg along with one unit of blood transfusion. She has been seen by GI Dr. Vicente Males who plans to scope her as an outpatient after her acute medical issues resolved. Patient is tentatively scheduled for discharge to the rehabilitation tomorrow. Currently patient reports fatigue and some exertional short shortness of breath. Denies other complaints  ECOG PS- 3     Review of systems- Review of Systems  Constitutional: Positive for malaise/fatigue. Negative for chills, fever and weight loss.  HENT: Negative for congestion, ear discharge and nosebleeds.   Eyes: Negative for blurred vision.  Respiratory: Positive for shortness of breath. Negative for cough, hemoptysis, sputum production and wheezing.   Cardiovascular: Negative for chest pain, palpitations, orthopnea and claudication.  Gastrointestinal: Negative for abdominal pain, blood in stool, constipation, diarrhea, heartburn, melena, nausea and vomiting.  Genitourinary: Negative for dysuria, flank pain, frequency, hematuria and urgency.  Musculoskeletal: Negative for back pain, joint pain  and myalgias.  Skin: Negative for rash.  Neurological: Negative for dizziness, tingling, focal weakness, seizures, weakness and headaches.  Endo/Heme/Allergies: Does not bruise/bleed easily.  Psychiatric/Behavioral: Negative for depression and suicidal ideas. The patient does not have insomnia.     Allergies  Allergen Reactions  . Tramadol Anaphylaxis    Patient Active Problem List   Diagnosis Date Noted  . 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 Sepsis ([place]) 12/18/2016  . CAP (community acquired pneumonia) 12/18/2016  . Anemia 12/18/2016  . Acute on chronic diastolic CHF (congestive heart failure) ([place]) 01/10/2015     Past Medical History:  Diagnosis Date  . Anemia   . Arthritis    hands, knees, back  . Atrial fibrillation ([place])   . CHF (congestive heart failure) ([place])   . COPD (chronic obstructive pulmonary disease) ([place])   . Diabetes mellitus without complication ([place])   . Dyspnea   . GERD (gastroesophageal reflux disease)   . Hyperlipidemia   . Hypertension   . PONV (postoperative nausea and vomiting)   . Sleep apnea    told she needs CPAP, does not have  . Wears dentures    full upper and lower.  only wears upper     Past Surgical History:  Procedure Laterality Date  . ABDOMINAL HYSTERECTOMY    . CATARACT EXTRACTION W/PHACO Right 07/12/2016   Procedure: CATARACT EXTRACTION PHACO AND INTRAOCULAR LENS PLACEMENT (IOC);  Surgeon: Leandrew Koyanagi, MD;  Location: [place];  Service: Ophthalmology;  Laterality: Right;  DIABETIC - insulin and oral meds sleep apnea  . CHOLECYSTECTOMY    . COLONOSCOPY N/A 01/19/2015   Procedure: COLONOSCOPY;  Surgeon: Josefine Class, MD;  Location: [facility] ENDOSCOPY;  Service: Endoscopy;  Laterality: N/A;  . ESOPHAGOGASTRODUODENOSCOPY N/A 01/19/2015   Procedure: ESOPHAGOGASTRODUODENOSCOPY (EGD);  Surgeon: Josefine Class, MD;  Location: [facility] ENDOSCOPY;  Service: Endoscopy;  Laterality: N/A;  . SHOULDER ARTHROSCOPY       Social History   Social History  . Marital status: Widowed    Spouse name: N/A  . Number of children: N/A  . Years of education: N/A   Occupational History  . Not on file.   Social History Main Topics  . Smoking status: Never Smoker  . Smokeless tobacco: Never Used  . Alcohol use No  . Drug use: Unknown  . Sexual activity: Not on file   Other Topics Concern  . Not on file   Social History Narrative  . No narrative on file     No family history on file.   Current Facility-Administered Medications:  .  0.9 %  sodium chloride infusion, , Intravenous, PRN, Gladstone Lighter, MD, Last Rate: 10 mL/hr at 12/20/16 1329 .  acetaminophen (TYLENOL) tablet 650 mg, 650 mg, Oral, Q6H PRN **OR** acetaminophen (TYLENOL) suppository 650 mg, 650 mg, Rectal, Q6H PRN, Lance Coon, MD .  amoxicillin-clavulanate (AUGMENTIN) 875-125 MG per tablet 1 tablet, 1 tablet, Oral, Q12H, Gladstone Lighter, MD, 1 tablet at 12/21/16 1416 .  atorvastatin (LIPITOR) tablet 80 mg, 80 mg, Oral, QPM, Lance Coon, MD, 80 mg at 12/20/16 1721 .  carvedilol (COREG) tablet 6.25 mg, 6.25 mg, Oral, BID WC, Gladstone Lighter, MD, 6.25 mg at 12/21/16 0953 .  cloNIDine (CATAPRES) tablet 0.1 mg, 0.1 mg, Oral, BID, Lance Coon, MD, 0.1 mg at 12/21/16 1416 .  famotidine (PEPCID) tablet 20 mg, 20 mg, Oral, Daily, Gladstone Lighter, MD, 20 mg at 12/21/16 1416 .  furosemide (LASIX) tablet 20 mg, 20 mg, Oral, BID, Lance Coon, MD, 20 mg at 12/21/16 4854 .  guaiFENesin-dextromethorphan (ROBITUSSIN DM) 100-10 MG/5ML syrup 5 mL, 5 mL, Oral, Q4H PRN, Lance Coon, MD .  insulin aspart (novoLOG) injection 0-5 Units, 0-5 Units, Subcutaneous, QHS, Lance Coon, MD, 3 Units at 12/20/16 2150 .  insulin aspart (novoLOG) injection 0-9 Units, 0-9 Units, Subcutaneous, TID WC, Lance Coon, MD, 3 Units at 12/21/16 1417 .  insulin aspart (novoLOG) injection 3 Units, 3 Units, Subcutaneous, TID WC, Gladstone Lighter, MD .  insulin detemir  (LEVEMIR) injection 28 Units, 28 Units, Subcutaneous, BID AC & HS, Gladstone Lighter, MD .  ipratropium-albuterol (DUONEB) 0.5-2.5 (3) MG/3ML nebulizer solution 3 mL, 3 mL, Nebulization, Q4H PRN, Lance Coon, MD .  iron sucrose (VENOFER) 200 mg in sodium chloride 0.9 % 150 mL IVPB, 200 mg, Intravenous, Q24H, Gladstone Lighter, MD, 200 mg at 12/20/16 1720 .  lisinopril (PRINIVIL,ZESTRIL) tablet 10 mg, 10 mg, Oral, Daily, Lance Coon, MD, 10 mg at 12/21/16 1416 .  ondansetron (ZOFRAN) tablet 4 mg, 4 mg, Oral, Q6H PRN **OR** ondansetron (ZOFRAN) injection 4 mg, 4 mg, Intravenous, Q6H PRN, Lance Coon, MD .  pantoprazole (PROTONIX) EC tablet 40 mg, 40 mg, Oral, Daily, Lance Coon, MD, 40 mg at 12/21/16 1416 .  pneumococcal 23 valent vaccine (PNU-IMMUNE) injection 0.5 mL, 0.5 mL, Intramuscular, Tomorrow-1000, Lance Coon, MD .  polyethylene glycol (MIRALAX / GLYCOLAX) packet 17 g, 17 g, Oral, Daily PRN, Gladstone Lighter, MD .  predniSONE (DELTASONE) tablet 40 mg, 40 mg, Oral, Q breakfast, Lance Coon, MD, 40 mg at 12/21/16 6270 .  senna-docusate (Senokot-S) tablet 2 tablet, 2 tablet, Oral, BID, Gladstone Lighter, MD, 2 tablet at 12/21/16 1417 .  sodium chloride flush (NS)  0.9 % injection 3 mL, 3 mL, Intravenous, Q12H, Lance Coon, MD, 3 mL at 12/21/16 1419   Physical exam:  Vitals:   12/21/16 0800 12/21/16 0952 12/21/16 1309 12/21/16 1411  BP: (!) 154/65 (!) 152/59 (!) 147/55 (!) 150/65  Pulse: 74 65 66 78  Resp:   20   Temp: 97.7 F (36.5 C)  98.2 F (36.8 C)   TempSrc: Oral  Oral   SpO2: 96%  97% 95%  Weight:      Height:       Physical Exam  Constitutional: She is oriented to person, place, and time.  Elderly, frail. Appears fatigued. No acute distress  HENT:  Head: Normocephalic and atraumatic.  Eyes: EOM are normal. Pupils are equal, round, and reactive to light.  Neck: Normal range of motion.  Cardiovascular: Normal rate, regular rhythm and normal heart sounds.     Pulmonary/Chest: Effort normal.  Bibasilar crackles  Abdominal: Soft. Bowel sounds are normal.  Neurological: She is alert and oriented to person, place, and time.  Skin: Skin is warm and dry.       CMP Latest Ref Rng & Units 12/21/2016  Glucose 65 - 99 mg/dL 220(H)  BUN 6 - 20 mg/dL 35(H)  Creatinine 0.44 - 1.00 mg/dL 1.30(H)  Sodium 135 - 145 mmol/L 142  Potassium 3.5 - 5.1 mmol/L 3.9  Chloride 101 - 111 mmol/L 102  CO2 22 - 32 mmol/L 32  Calcium 8.9 - 10.3 mg/dL 8.5(L)  Total Protein 6.5 - 8.1 g/dL -  Total Bilirubin 0.3 - 1.2 mg/dL -  Alkaline Phos 38 - 126 U/L -  AST 15 - 41 U/L -  ALT 14 - 54 U/L -   CBC Latest Ref Rng & Units 12/21/2016  WBC 3.6 - 11.0 K/uL 6.3  Hemoglobin 12.0 - 16.0 g/dL 7.8(L)  Hematocrit 35.0 - 47.0 % 25.9(L)  Platelets 150 - 440 K/uL 179    @IMAGES @  Dg Chest 2 View  Result Date: 12/21/2016 CLINICAL DATA:  History of recent pneumonia EXAM: CHEST  2 VIEW COMPARISON:  Portable chest x-ray of 12/18/2016 FINDINGS: The pulmonary vascular congestion has resolved. No definite pneumonia is seen. Linear scarring or atelectasis remains bilaterally. Cardiomegaly is stable. No bony abnormality is seen. IMPRESSION: Resolution of pulmonary vascular congestion. Stable linear scarring bilaterally. Stable cardiomegaly Electronically Signed   By: Ivar Drape M.D.   On: 12/21/2016 11:31   Dg Lumbar Spine Complete  Result Date: 12/17/2016 CLINICAL DATA:  Fall.  Low back pain. EXAM: LUMBAR SPINE - COMPLETE 4+ VIEW COMPARISON:  05/23/2014 lumbar spine radiographs. FINDINGS: This report assumes 5 non rib-bearing lumbar vertebrae. Stable mild chronic T12 vertebral compression fracture. Lumbar vertebral body heights are preserved, with no fracture. Mild-to-moderate multilevel degenerative disc disease in the visualized thoracolumbar spine, most prominent at L1-2 and L5-S1, unchanged. Minimal 2 mm retrolisthesis at L1-2, L2-3 and L3-4, unchanged. No new spondylolisthesis.  Bilateral facet arthropathy in the lower lumbar spine. No aggressive appearing focal osseous lesions. Abdominal aortic atherosclerosis. Surgical clips and sutures overlie the bilateral abdomen. IMPRESSION: 1. No lumbar spine fracture or acute malalignment . 2. Stable chronic mild T12 vertebral compression fracture. 3. Mild-to-moderate multilevel degenerative disc disease, unchanged . 4. Bilateral facet arthropathy in the lower lumbar spine. 5. Minimal multilevel spondylolisthesis in the lumbar spine as detailed, unchanged. 6. Aortic atherosclerosis. Electronically Signed   By: Ilona Sorrel M.D.   On: 12/17/2016 18:31   Ct Head Wo Contrast  Result Date: 12/17/2016 CLINICAL DATA:  Unwitnessed fall EXAM: CT HEAD WITHOUT CONTRAST TECHNIQUE: Contiguous axial images were obtained from the base of the skull through the vertex without intravenous contrast. COMPARISON:  None. FINDINGS: Brain: No evidence of acute infarction, hemorrhage, hydrocephalus, extra-axial collection. 10 mm calcified right frontal meningioma (coronal image 12), without underlying mass effect or vasogenic edema. No midline shift. Vascular: Intracranial atherosclerosis. Skull: Normal. Negative for fracture or focal lesion. Sinuses/Orbits: The visualized paranasal sinuses are essentially clear. The mastoid air cells are unopacified. Other: None. IMPRESSION: No evidence of acute intracranial abnormality. Electronically Signed   By: Julian Hy M.D.   On: 12/17/2016 18:46   Dg Chest Portable 1 View  Result Date: 12/18/2016 CLINICAL DATA:  Allergic reaction and respiratory distress EXAM: PORTABLE CHEST 1 VIEW COMPARISON:  09/03/2016 FINDINGS: Cardiac shadow remains enlarged. Mild vascular congestion is noted. Mild interstitial edema is seen. Some patchy right upper lobe infiltrate is noted. No sizable effusion is seen. No bony abnormality is noted. IMPRESSION: Vascular congestion with mild interstitial edema and patchy right upper lobe  infiltrate. Electronically Signed   By: Inez Catalina M.D.   On: 12/18/2016 20:13    Assessment and plan- Patient is a 82 y.o. female with iron deficiency anemia  She will follow up with GI as an outpatient to work up her iron deficiency anemia. I will schedule her for 3 weekly doses of venofer as an outpatient. I will see her in my office in 3 weeks with cbc and iron studies. Patient was also found to be B12 deficient and received 1 dose of B12 during this hospital stay. She will need monthly B12 as an outpatient   Thank you for this kind referral and the opportunity to participate in the care of this patient   Visit Diagnosis: 1. Iron deficiency anemia 2. B12 deficiency  Dr. Randa Evens, MD, MPH [facility] Pager- [phone] 12/21/2016  3:43 PM

## 2016-12-22 ENCOUNTER — Encounter
Admission: RE | Admit: 2016-12-22 | Discharge: 2016-12-22 | Disposition: A | Discharge status: Home / Self Care | Payer: Medicare Other | Source: Ambulatory Visit | Attending: Internal Medicine | Admitting: Internal Medicine

## 2016-12-22 DIAGNOSIS — N189 Chronic kidney disease, unspecified: Secondary | ICD-10-CM | POA: Insufficient documentation

## 2016-12-22 DIAGNOSIS — D638 Anemia in other chronic diseases classified elsewhere: Secondary | ICD-10-CM | POA: Insufficient documentation

## 2016-12-22 LAB — GLUCOSE, CAPILLARY
GLUCOSE-CAPILLARY: 139 mg/dL — AB (ref 65–99)
Glucose-Capillary: 230 mg/dL — ABNORMAL HIGH (ref 65–99)

## 2016-12-22 MED ORDER — IPRATROPIUM-ALBUTEROL 0.5-2.5 (3) MG/3ML IN SOLN: 3.0000 mL | Freq: Four times a day (QID) | RESPIRATORY_TRACT | 0 refills | Status: DC | PRN

## 2016-12-22 MED ORDER — CARVEDILOL 6.25 MG PO TABS: 6.2500 mg | ORAL_TABLET | Freq: Two times a day (BID) | ORAL | 2 refills | Status: DC

## 2016-12-22 MED ORDER — INSULIN DETEMIR 100 UNIT/ML ~~LOC~~ SOLN
22.0000 [IU] | Freq: Two times a day (BID) | SUBCUTANEOUS | Status: DC
Start: 2016-12-22 — End: 2016-12-22
  Administered 2016-12-22: 22 [IU] via SUBCUTANEOUS
  Filled 2016-12-22 (×4): qty 0.22

## 2016-12-22 MED ORDER — AMOXICILLIN-POT CLAVULANATE 875-125 MG PO TABS: 1.0000 | ORAL_TABLET | Freq: Two times a day (BID) | ORAL | 0 refills | Status: DC

## 2016-12-22 MED ORDER — INSULIN DETEMIR 100 UNIT/ML ~~LOC~~ SOLN
22.0000 [IU] | Freq: Two times a day (BID) | SUBCUTANEOUS | Status: DC
  Filled 2016-12-22: qty 0.22

## 2016-12-22 MED ORDER — INSULIN NPH (HUMAN) (ISOPHANE) 100 UNIT/ML ~~LOC~~ SUSP
23.0000 [IU] | Freq: Two times a day (BID) | SUBCUTANEOUS | 11 refills | Status: DC
Start: 2016-12-22 — End: 2019-10-05

## 2016-12-22 MED ORDER — SENNOSIDES-DOCUSATE SODIUM 8.6-50 MG PO TABS: 2.0000 | ORAL_TABLET | Freq: Two times a day (BID) | ORAL | 0 refills | Status: DC

## 2016-12-22 NOTE — Progress Notes (Signed)
Notified MD of blood glucose of 139. Per MD do not give sliding scale or meal coverage this AM. MD will decrease levemir dose.

## 2016-12-22 NOTE — Care Management Important Message (Signed)
Important Message  Patient Details  Name: Dorothy [name] MRN: [id number] Date of Birth: January 01, 1935   Medicare Important Message Given:  Yes    Beverly Sessions, RN 12/22/2016, 11:09 AM

## 2016-12-22 NOTE — Progress Notes (Signed)
Pt A and O x 4. VSS. Pt tolerating diet well. No complaints of pain or nausea. IV removed intact. Pt voiced understanding of discharge instructions with no further questions. Pt discharged via EMS to facility. Report called to [facility].

## 2016-12-22 NOTE — Clinical Social Work Placement (Signed)
   CLINICAL SOCIAL WORK PLACEMENT  NOTE  Date:  12/22/2016  Patient Details  Name: Dorothy [name] MRN: [id number] Date of Birth: [birth year]  Clinical Social Work is seeking post-discharge placement for this patient at the [place] level of care (*CSW will initial, date and re-position this form in  chart as items are completed):  Yes   Patient/family provided with [place] Work Department's list of facilities offering this level of care within the geographic area requested by the patient (or if unable, by the patient's family).  Yes   Patient/family informed of their freedom to choose among providers that offer the needed level of care, that participate in Medicare, Medicaid or managed care program needed by the patient, have an available bed and are willing to accept the patient.  Yes   Patient/family informed of [facility]'s ownership interest in [facility] and [facility], as well as of the fact that they are under no obligation to receive care at these facilities.  PASRR submitted to EDS on 12/21/16     PASRR number received on 12/21/16     Existing PASRR number confirmed on       FL2 transmitted to all facilities in geographic area requested by pt/family on       FL2 transmitted to all facilities within larger geographic area on       Patient informed that his/her managed care company has contracts with or will negotiate with certain facilities, including the following:        Yes   Patient/family informed of bed offers received.  Patient chooses bed at  [facility])     Physician recommends and patient chooses bed at  [facility])    Patient to be transferred to  [facility]) on 12/22/16.  Patient to be transferred to facility by  (EMS)     Patient family notified on 12/22/16 of transfer.  Name of family member notified:   (sister)     PHYSICIAN       Additional Comment:     _______________________________________________ [name], LCSW 12/22/2016, 10:50 AM

## 2016-12-22 NOTE — Discharge Summary (Signed)
[facility] NAME: Dorothy [name]    MR#:  [id number]  DATE OF BIRTH:  [birth year]  DATE OF ADMISSION:  12/18/2016   ADMITTING PHYSICIAN: Lance Coon, MD  DATE OF DISCHARGE:  12/22/2016  PRIMARY CARE PHYSICIAN: Letta Median, MD   ADMISSION DIAGNOSIS:   Hypoglycemia [E16.2] Allergic reaction, initial encounter [T78.40XA] Congestive heart failure, unspecified congestive heart failure chronicity, unspecified congestive heart failure type ([place]) [I50.9] Community acquired pneumonia of right upper lobe of lung ([place]) [J18.1]  DISCHARGE DIAGNOSIS:   Principal Problem:   Allergic angioedema Active Problems:   Acute on chronic diastolic CHF (congestive heart failure) ([place])   Sepsis ([place])   CAP (community acquired pneumonia)   Anemia   B12 deficiency   SECONDARY DIAGNOSIS:   Past Medical History:  Diagnosis Date  . Anemia   . Arthritis    hands, knees, back  . Atrial fibrillation ([place])   . CHF (congestive heart failure) ([place])   . COPD (chronic obstructive pulmonary disease) ([place])   . Diabetes mellitus without complication ([place])   . Dyspnea   . GERD (gastroesophageal reflux disease)   . Hyperlipidemia   . Hypertension   . PONV (postoperative nausea and vomiting)   . Sleep apnea    told she needs CPAP, does not have  . Wears dentures    full upper and lower.  only wears upper    HOSPITAL COURSE:   82 year old female with past medical history significant for diabetes, GERD, hypertension, chronic systolic CHF, COPD not on oxygen, chronic anemia presents to hospital secondary to an unresponsive episode.  #1 acute on chronic anemia-no history of anemia, baseline hemoglobin around 8.5 to 9. Dropped to 6.4 on admission No active bleeding. Stool for guaiac is positive. -Appreciate GI consult. Recent EGD and colonoscopy from 2 years ago without any acute findings. Will need capsule endoscopy as  outpatient. -Significant iron deficiency on labs. Received IV iron for 3 days in the hospital and being discharged on iron supplements. Appreciate hematology consult and patient will follow up as outpatient for IV iron infusions.. -Received total 2 units of packed RBC this admission and hemoglobin is at 7.8. -Her B12 level was also low and she received an intramuscular shot while in the hospital.  #2 angioedema-received tramadol from the emergency room. Not sure if she had ever taken any narcotics before. But had facial swelling and lip swelling on presentation. Tramadol has been discontinued and added to her allergy list. -Swelling has much improved. No indication for any steroids at this time  #3 altered mental status-metabolic encephalopathy. Could be secondary to her injury with edema and reaction to tramadol. Also could be from her anemia.  -mental status is back to normal at this time. -CT of the head without any acute abnormality.  #4 chronic atrial fibrillation-rate controlled. Started low-dose Coreg. -Appreciate cardiology consult. Not a candidate for eliquis with her chronic anemia issues  #5 pneumonia-could be possibly from aspiration as well as she was completely normal couple of days ago. Received Zosyn in the hospital-changed to Augmentin at discharge.  Encourage incentive spirometer.  -Stop low-dose prednisone. Continue duo nebs as needed  #6 diabetes mellitus-continue NPH insulin twice a day at discharge. Also on metformin. Discontinue glipizide   Physical therapy consulted needs rehabilitation at discharge. Daughter updated at bedside Discharge today   DISCHARGE CONDITIONS:   Guarded CONSULTS OBTAINED:   Treatment Team:  Isaias Cowman, MD Sindy Guadeloupe, MD  DRUG ALLERGIES:   Allergies  Allergen Reactions  . Tramadol Anaphylaxis   DISCHARGE MEDICATIONS:   Allergies as of 12/22/2016      Reactions   Tramadol Anaphylaxis      Medication List     STOP taking these medications   ELIQUIS 5 MG Tabs tablet Generic drug:  apixaban   glipiZIDE 10 MG 24 hr tablet Commonly known as:  GLUCOTROL XL     TAKE these medications   acetaminophen 500 MG tablet Commonly known as:  TYLENOL Take 1,000 mg by mouth every 6 (six) hours as needed for moderate pain or headache.   albuterol 108 (90 Base) MCG/ACT inhaler Commonly known as:  PROVENTIL HFA;VENTOLIN HFA Inhale 2 puffs into the lungs every 6 (six) hours as needed for wheezing or shortness of breath.   amoxicillin-clavulanate 875-125 MG tablet Commonly known as:  AUGMENTIN Take 1 tablet by mouth every 12 (twelve) hours. X 6 more days   atorvastatin 80 MG tablet Commonly known as:  LIPITOR Take 80 mg by mouth daily.   CALCIUM 600 + D PO Take 1 tablet by mouth.   carvedilol 6.25 MG tablet Commonly known as:  COREG Take 1 tablet (6.25 mg total) by mouth 2 (two) times daily with a meal.   cloNIDine 0.1 MG tablet Commonly known as:  CATAPRES Take 0.1 mg by mouth 2 (two) times daily.   esomeprazole 20 MG capsule Commonly known as:  NEXIUM Take 20 mg by mouth at bedtime.   ferrous sulfate 325 (65 FE) MG tablet Take 325 mg by mouth daily with breakfast.   furosemide 20 MG tablet Commonly known as:  LASIX Take 20 mg by mouth 2 (two) times daily.   insulin NPH Human 100 UNIT/ML injection Commonly known as:  HUMULIN N,NOVOLIN N Inject 0.23 mLs (23 Units total) into the skin 2 (two) times daily before a meal. What changed:  how much to take   ipratropium-albuterol 0.5-2.5 (3) MG/3ML Soln Commonly known as:  DUONEB Take 3 mLs by nebulization every 6 (six) hours as needed.   lisinopril 10 MG tablet Commonly known as:  PRINIVIL,ZESTRIL Take 10 mg by mouth daily.   metFORMIN 1000 MG (MOD) 24 hr tablet Commonly known as:  GLUMETZA Take 1,000 mg by mouth 2 (two) times daily.   potassium chloride 10 MEQ tablet Commonly known as:  K-DUR Take 10 mEq by mouth 2 (two) times  daily.   senna-docusate 8.6-50 MG tablet Commonly known as:  Senokot-S Take 2 tablets by mouth 2 (two) times daily.        DISCHARGE INSTRUCTIONS:   1. PCP follow-up in 1-2 weeks 2. Hematology follow-up in 2 weeks 3. GI follow up in 3 weeks 4. Cardiology follow up in 2 weeks  DIET:   Cardiac diet  ACTIVITY:   Activity as tolerated  OXYGEN:   Home Oxygen: Yes.    Oxygen Delivery: 1 liters/min via Patient connected to nasal cannula oxygen  DISCHARGE LOCATION:   nursing home   If you experience worsening of your admission symptoms, develop shortness of breath, life threatening emergency, suicidal or homicidal thoughts you must seek medical attention immediately by calling 911 or calling your MD immediately  if symptoms less severe.  You Must read complete instructions/literature along with all the possible adverse reactions/side effects for all the Medicines you take and that have been prescribed to you. Take any new Medicines after you have completely understood and accpet all the possible adverse reactions/side effects.  Please note  You were cared for by a hospitalist during your hospital stay. If you have any questions about your discharge medications or the care you received while you were in the hospital after you are discharged, you can call the unit and asked to speak with the hospitalist on call if the hospitalist that took care of you is not available. Once you are discharged, your primary care physician will handle any further medical issues. Please note that NO REFILLS for any discharge medications will be authorized once you are discharged, as it is imperative that you return to your primary care physician (or establish a relationship with a primary care physician if you do not have one) for your aftercare needs so that they can reassess your need for medications and monitor your lab values.    On the day of Discharge:  VITAL SIGNS:   Blood pressure (!)  154/67, pulse 61, temperature 98 F (36.7 C), temperature source Oral, resp. rate 20, height 5\' 5"  (1.651 m), weight 93.1 kg (205 lb 4.8 oz), SpO2 91 %.  PHYSICAL EXAMINATION:   GENERAL:  82 y.o.-year-old Elderly and obese patient lying in the bed with no acute distress.  EYES: Pupils equal, round, reactive to light and accommodation. No scleral icterus. Extraocular muscles intact.  HEENT: Head atraumatic, normocephalic. Oropharynx and nasopharynx clear. Minimal facial swelling at the cheeks is improving NECK:  Supple, no jugular venous distention. No thyroid enlargement, no tenderness.  LUNGS: Normal breath sounds bilaterally, no wheezing or crepitation. No use of accessory muscles of respiration. Improving rhonchi at the bases CARDIOVASCULAR: S1, S2 normal. No rubs, or gallops. 2/6 systolic murmur is present ABDOMEN: Soft, nontender, nondistended. Bowel sounds present. No organomegaly or mass.  EXTREMITIES: No cyanosis, or clubbing. No pedal edema NEUROLOGIC: Cranial nerves II through XII are intact. Muscle strength 5/5 in all extremities. Sensation intact. Gait not checked. Global weakness noted. PSYCHIATRIC: The patient is alert and oriented x 3.  SKIN: No obvious rash, lesion, or ulcer.   DATA REVIEW:   CBC  Recent Labs Lab 12/21/16 0519  WBC 6.3  HGB 7.8*  HCT 25.9*  PLT 179    Chemistries   Recent Labs Lab 12/18/16 1942  12/21/16 0519  NA 141  < > 142  K 4.8  < > 3.9  CL 108  < > 102  CO2 22  < > 32  GLUCOSE 99  < > 220*  BUN 19  < > 35*  CREATININE 1.26*  < > 1.30*  CALCIUM 9.2  < > 8.5*  AST 40  --   --   ALT 25  --   --   ALKPHOS 85  --   --   BILITOT 1.5*  --   --   < > = values in this interval not displayed.   Microbiology Results  Results for orders placed or performed during the hospital encounter of 12/18/16  Blood culture (routine x 2)     Status: None (Preliminary result)   Collection Time: 12/18/16  9:52 PM  Result Value Ref Range Status    Specimen Description BLOOD LEFT ANTECUBITAL  Final   Special Requests   Final    BOTTLES DRAWN AEROBIC AND ANAEROBIC Blood Culture adequate volume   Culture NO GROWTH 4 DAYS  Final   Report Status PENDING  Incomplete  Blood culture (routine x 2)     Status: None (Preliminary result)   Collection Time: 12/18/16  9:52 PM  Result Value  Ref Range Status   Specimen Description BLOOD LEFT HAND  Final   Special Requests   Final    BOTTLES DRAWN AEROBIC AND ANAEROBIC Blood Culture adequate volume   Culture NO GROWTH 4 DAYS  Final   Report Status PENDING  Incomplete    RADIOLOGY:  Dg Chest 2 View  Result Date: 12/21/2016 CLINICAL DATA:  History of recent pneumonia EXAM: CHEST  2 VIEW COMPARISON:  Portable chest x-ray of 12/18/2016 FINDINGS: The pulmonary vascular congestion has resolved. No definite pneumonia is seen. Linear scarring or atelectasis remains bilaterally. Cardiomegaly is stable. No bony abnormality is seen. IMPRESSION: Resolution of pulmonary vascular congestion. Stable linear scarring bilaterally. Stable cardiomegaly Electronically Signed   By: Ivar Drape M.D.   On: 12/21/2016 11:31     Management plans discussed with the patient, family and they are in agreement.  CODE STATUS:     Code Status Orders        Start     Ordered   12/19/16 0023  Full code  Continuous     12/19/16 0023    Code Status History    Date Active Date Inactive Code Status Order ID Comments User Context   01/09/2015  1:08 PM 01/10/2015  7:49 PM Full Code [id number]  Leonard Schwartz, RN Inpatient    Advance Directive Documentation     Most Recent Value  Type of Advance Directive  Healthcare Power of Attorney  Pre-existing out of facility DNR order (yellow form or pink MOST form)  -  "MOST" Form in Place?  -      TOTAL TIME TAKING CARE OF THIS PATIENT: 37 minutes.    Gladstone Lighter M.D on 12/22/2016 at 10:26 AM  Between 7am to 6pm - Pager - [phone]  After 6pm go to [email or web address] -  Proofreader  [facility]  Office  [phone]  CC: Primary care physician; Letta Median, MD   Note: This dictation was prepared with Dragon dictation along with smaller phrase technology. Any transcriptional errors that result from this process are unintentional.

## 2016-12-22 NOTE — Clinical Social Work Note (Signed)
Patient to discharge to [facility] today. Sharyn Lull at [facility] is aware and discharge information sent. Patient requests transport via EMS. [name] MSW,LCSW [phone]

## 2016-12-23 LAB — CULTURE, BLOOD (ROUTINE X 2)
Culture: NO GROWTH
Culture: NO GROWTH
SPECIAL REQUESTS: ADEQUATE
Special Requests: ADEQUATE

## 2016-12-23 LAB — GLUCOSE, CAPILLARY
GLUCOSE-CAPILLARY: 311 mg/dL — AB (ref 65–99)
GLUCOSE-CAPILLARY: 112 mg/dL — AB (ref 65–99)
GLUCOSE-CAPILLARY: 168 mg/dL — AB (ref 65–99)
Glucose-Capillary: 245 mg/dL — ABNORMAL HIGH (ref 65–99)

## 2016-12-24 LAB — GLUCOSE, CAPILLARY
Glucose-Capillary: 191 mg/dL — ABNORMAL HIGH (ref 65–99)
Glucose-Capillary: 205 mg/dL — ABNORMAL HIGH (ref 65–99)
Glucose-Capillary: 100 mg/dL — ABNORMAL HIGH (ref 65–99)

## 2016-12-25 LAB — GLUCOSE, CAPILLARY
GLUCOSE-CAPILLARY: 171 mg/dL — AB (ref 65–99)
Glucose-Capillary: 154 mg/dL — ABNORMAL HIGH (ref 65–99)
Glucose-Capillary: 174 mg/dL — ABNORMAL HIGH (ref 65–99)
Glucose-Capillary: 208 mg/dL — ABNORMAL HIGH (ref 65–99)

## 2016-12-26 ENCOUNTER — Ambulatory Visit: Payer: No Typology Code available for payment source | Attending: Family | Admitting: Family

## 2016-12-26 ENCOUNTER — Encounter: Payer: Self-pay | Admitting: Family

## 2016-12-26 DIAGNOSIS — E119 Type 2 diabetes mellitus without complications: Secondary | ICD-10-CM | POA: Insufficient documentation

## 2016-12-26 DIAGNOSIS — G4733 Obstructive sleep apnea (adult) (pediatric): Secondary | ICD-10-CM | POA: Insufficient documentation

## 2016-12-26 DIAGNOSIS — J449 Chronic obstructive pulmonary disease, unspecified: Secondary | ICD-10-CM | POA: Insufficient documentation

## 2016-12-26 DIAGNOSIS — Z79899 Other long term (current) drug therapy: Secondary | ICD-10-CM | POA: Insufficient documentation

## 2016-12-26 DIAGNOSIS — I480 Paroxysmal atrial fibrillation: Secondary | ICD-10-CM

## 2016-12-26 DIAGNOSIS — I4891 Unspecified atrial fibrillation: Secondary | ICD-10-CM | POA: Insufficient documentation

## 2016-12-26 DIAGNOSIS — I10 Essential (primary) hypertension: Secondary | ICD-10-CM | POA: Insufficient documentation

## 2016-12-26 DIAGNOSIS — I110 Hypertensive heart disease with heart failure: Secondary | ICD-10-CM | POA: Insufficient documentation

## 2016-12-26 DIAGNOSIS — I5032 Chronic diastolic (congestive) heart failure: Secondary | ICD-10-CM

## 2016-12-26 DIAGNOSIS — Z888 Allergy status to other drugs, medicaments and biological substances status: Secondary | ICD-10-CM | POA: Insufficient documentation

## 2016-12-26 DIAGNOSIS — R42 Dizziness and giddiness: Secondary | ICD-10-CM | POA: Insufficient documentation

## 2016-12-26 DIAGNOSIS — D649 Anemia, unspecified: Secondary | ICD-10-CM | POA: Insufficient documentation

## 2016-12-26 DIAGNOSIS — M1990 Unspecified osteoarthritis, unspecified site: Secondary | ICD-10-CM | POA: Insufficient documentation

## 2016-12-26 DIAGNOSIS — K219 Gastro-esophageal reflux disease without esophagitis: Secondary | ICD-10-CM | POA: Insufficient documentation

## 2016-12-26 DIAGNOSIS — Z7984 Long term (current) use of oral hypoglycemic drugs: Secondary | ICD-10-CM | POA: Insufficient documentation

## 2016-12-26 DIAGNOSIS — I482 Chronic atrial fibrillation, unspecified: Secondary | ICD-10-CM | POA: Insufficient documentation

## 2016-12-26 DIAGNOSIS — E785 Hyperlipidemia, unspecified: Secondary | ICD-10-CM | POA: Insufficient documentation

## 2016-12-26 LAB — GLUCOSE, CAPILLARY
Glucose-Capillary: 194 mg/dL — ABNORMAL HIGH (ref 65–99)
Glucose-Capillary: 232 mg/dL — ABNORMAL HIGH (ref 65–99)
Glucose-Capillary: 217 mg/dL — ABNORMAL HIGH (ref 65–99)

## 2016-12-26 NOTE — Progress Notes (Signed)
Patient ID: Dorothy [name], female    DOB: [birth year], 82 y.o.   MRN: [id number]  HPI  Ms Etzkorn is a 82 y/o female with a history of anemia, atrial fibrillation, COPD, DM, GERD, hyperlipidemia, HTN, obstructive sleep apnea (unable to wear CPAP due to cost) and chronic heart failure.   Reviewed last echo report done on 12/19/16 and it showed an EF of 55-60% along with moderate MR and mod/severe TR.   Admitted 12/18/16 with acute on chronic anemia along with angioedema related to tramadol. GI & hematology consults obtained. Received IV iron for 3 days along with 2 units of PRBC's. Angioedema resolved on its own. Discharged home after 4 days. Was in the ED 12/17/16 after a mechanical fall. Xrays were obtained and she was discharged home. Was in the ED 09/03/16 with leg edema. BNP unremarkable. Patient discharged home with instructions to take her furosemide as prescribed.   She presents today for her initial visit with a chief complaint of a mild cough. She describes it as a dry cough which has been present for a few weeks. Does feel like it's improving. Nothing makes it worse and nothing makes it better. Has associated sore throat and dizziness.   Past Medical History:  Diagnosis Date  . Anemia   . Arthritis    hands, knees, back  . Atrial fibrillation ([place])   . CHF (congestive heart failure) ([place])   . COPD (chronic obstructive pulmonary disease) ([place])   . Diabetes mellitus without complication ([place])   . Dyspnea   . GERD (gastroesophageal reflux disease)   . Hyperlipidemia   . Hypertension   . PONV (postoperative nausea and vomiting)   . Sleep apnea    told she needs CPAP, does not have  . Wears dentures    full upper and lower.  only wears upper   Past Surgical History:  Procedure Laterality Date  . ABDOMINAL HYSTERECTOMY    . CATARACT EXTRACTION W/PHACO Right 07/12/2016   Procedure: CATARACT EXTRACTION PHACO AND INTRAOCULAR LENS PLACEMENT (IOC);  Surgeon: Leandrew Koyanagi, MD;   Location: [facility];  Service: Ophthalmology;  Laterality: Right;  DIABETIC - insulin and oral meds sleep apnea  . CHOLECYSTECTOMY    . COLONOSCOPY N/A 01/19/2015   Procedure: COLONOSCOPY;  Surgeon: Josefine Class, MD;  Location: [facility] ENDOSCOPY;  Service: Endoscopy;  Laterality: N/A;  . ESOPHAGOGASTRODUODENOSCOPY N/A 01/19/2015   Procedure: ESOPHAGOGASTRODUODENOSCOPY (EGD);  Surgeon: Josefine Class, MD;  Location: [facility] ENDOSCOPY;  Service: Endoscopy;  Laterality: N/A;  . SHOULDER ARTHROSCOPY     No family history on file. Social History  Substance Use Topics  . Smoking status: Never Smoker  . Smokeless tobacco: Never Used  . Alcohol use No   Allergies  Allergen Reactions  . Tramadol Anaphylaxis   Prior to Admission medications   Medication Sig Start Date End Date Taking? Authorizing Provider  acetaminophen (TYLENOL) 500 MG tablet Take 1,000 mg by mouth every 6 (six) hours as needed for moderate pain or headache.   Yes Historical Provider, MD  albuterol (PROVENTIL HFA;VENTOLIN HFA) 108 (90 BASE) MCG/ACT inhaler Inhale 2 puffs into the lungs every 6 (six) hours as needed for wheezing or shortness of breath.   Yes Historical Provider, MD  amoxicillin-clavulanate (AUGMENTIN) 875-125 MG tablet Take 1 tablet by mouth every 12 (twelve) hours. X 6 more days 12/22/16  Yes Gladstone Lighter, MD  atorvastatin (LIPITOR) 80 MG tablet Take 80 mg by mouth daily.   Yes Historical Provider, MD  Calcium Carb-Cholecalciferol (CALCIUM 600 + D PO) Take 1 tablet by mouth.   Yes Historical Provider, MD  carvedilol (COREG) 6.25 MG tablet Take 1 tablet (6.25 mg total) by mouth 2 (two) times daily with a meal. 12/22/16  Yes Gladstone Lighter, MD  cloNIDine (CATAPRES) 0.1 MG tablet Take 0.1 mg by mouth 2 (two) times daily.   Yes Historical Provider, MD  esomeprazole (NEXIUM) 20 MG capsule Take 20 mg by mouth at bedtime.   Yes Historical Provider, MD  ferrous sulfate 325 (65 FE) MG tablet Take 325  mg by mouth daily with breakfast.   Yes Historical Provider, MD  furosemide (LASIX) 20 MG tablet Take 20 mg by mouth 2 (two) times daily.   Yes Historical Provider, MD  insulin NPH Human (HUMULIN N,NOVOLIN N) 100 UNIT/ML injection Inject 0.23 mLs (23 Units total) into the skin 2 (two) times daily before a meal. 12/22/16  Yes Gladstone Lighter, MD  ipratropium-albuterol (DUONEB) 0.5-2.5 (3) MG/3ML SOLN Take 3 mLs by nebulization every 6 (six) hours as needed. 12/22/16  Yes Gladstone Lighter, MD  lisinopril (PRINIVIL,ZESTRIL) 10 MG tablet Take 10 mg by mouth daily.   Yes Historical Provider, MD  metFORMIN (GLUMETZA) 1000 MG (MOD) 24 hr tablet Take 1,000 mg by mouth 2 (two) times daily.   Yes Historical Provider, MD  potassium chloride (K-DUR) 10 MEQ tablet Take 10 mEq by mouth 2 (two) times daily.   Yes Historical Provider, MD  senna-docusate (SENOKOT-S) 8.6-50 MG tablet Take 2 tablets by mouth 2 (two) times daily. 12/22/16  Yes Gladstone Lighter, MD   Review of Systems  Constitutional: Negative for appetite change and fatigue.  HENT: Positive for sore throat. Negative for congestion and postnasal drip.   Eyes: Negative.   Respiratory: Positive for cough. Negative for chest tightness and shortness of breath.   Cardiovascular: Negative for chest pain, palpitations and leg swelling.  Gastrointestinal: Negative for abdominal distention and abdominal pain.  Endocrine: Negative.   Genitourinary: Negative.   Musculoskeletal: Positive for back pain. Negative for neck pain.  Skin: Negative.   Allergic/Immunologic: Negative.   Neurological: Positive for light-headedness. Negative for dizziness.  Hematological: Negative for adenopathy. Bruises/bleeds easily.  Psychiatric/Behavioral: Negative for dysphoric mood, sleep disturbance (sleeping on 1 pillow with oxygen at 1L) and suicidal ideas. The patient is not nervous/anxious.    Vitals:   12/26/16 0906  BP: (!) 128/45  Pulse: 79  Resp: 20  SpO2: 98%   Weight: 189 lb 7 oz (85.9 kg)  Height: 5\' 5"  (1.651 m)   Wt Readings from Last 3 Encounters:  12/26/16 189 lb 7 oz (85.9 kg)  12/22/16 205 lb 4.8 oz (93.1 kg)  12/17/16 208 lb (94.3 kg)   Lab Results  Component Value Date   CREATININE 1.30 (H) 12/21/2016   CREATININE 1.28 (H) 12/20/2016   CREATININE 1.10 (H) 12/19/2016   Physical Exam  Constitutional: She is oriented to person, place, and time. She appears well-developed and well-nourished.  HENT:  Head: Normocephalic and atraumatic.  Neck: Normal range of motion. Neck supple. No JVD present.  Cardiovascular: Normal rate.  An irregular rhythm present.  Pulmonary/Chest: Effort normal. She has no wheezes. She has no rales.  Abdominal: Soft. She exhibits no distension. There is no tenderness.  Musculoskeletal: She exhibits edema (trace edema around bilateral ankles). She exhibits no tenderness.  Neurological: She is alert and oriented to person, place, and time.  Skin: Skin is warm and dry. Ecchymosis (bilateral forearms) noted.  Psychiatric: She has  a normal mood and affect. Her behavior is normal.  Nursing note and vitals reviewed.  Assessment & Plan:  1: Chronic heart failure with preserved ejection fraction- - NYHA class I - euvolemic - being weighed daily at facility. Instructed that when she goes home, to weigh daily at home and call for an overnight weight gain of >2 pounds or a weekly weight gain of >5 pounds - using Mrs. Dash and she was encouraged to closely follow a 2000mg  sodium diet once she goes home. Written dietary information was given to her about this. - encouraged her to elevate her legs some during the day - receiving physical therapy with the goal of going home in the next 2-4 weeks - saw cardiologist ([place]) 12/05/16 - has had 2 sleep studies done but says that she can't afford the CPAP equipment  2: HTN- - BP looks good today - follows with PCP ([facility]) - BMP reviewed from 12/10/16 and it shows a  potassium of 3.9 and GFR 38  3: Atrial fibrillation- - currently rate controlled - taking carvedilol - currently not taking anticoagulants; she will follow up with cardiologist about this  Medication list from facility reviewed.  Return here in 1 month or sooner for any questions/problems before then.

## 2016-12-26 NOTE — Patient Instructions (Signed)
Continue weighing daily and call for an overnight weight gain of > 2 pounds or a weekly weight gain of >5 pounds. 

## 2016-12-27 LAB — CBC WITH DIFFERENTIAL/PLATELET
BASOS ABS: 0 10*3/uL (ref 0–0.1)
BASOS PCT: 0 %
EOS ABS: 0.4 10*3/uL (ref 0–0.7)
EOS PCT: 6 %
HCT: 35 % (ref 35.0–47.0)
Hemoglobin: 10.6 g/dL — ABNORMAL LOW (ref 12.0–16.0)
LYMPHS PCT: 9 %
Lymphs Abs: 0.7 10*3/uL — ABNORMAL LOW (ref 1.0–3.6)
MCH: 22.6 pg — ABNORMAL LOW (ref 26.0–34.0)
MCHC: 30.2 g/dL — ABNORMAL LOW (ref 32.0–36.0)
MCV: 74.9 fL — AB (ref 80.0–100.0)
MONO ABS: 0.6 10*3/uL (ref 0.2–0.9)
Monocytes Relative: 7 %
Neutro Abs: 6 10*3/uL (ref 1.4–6.5)
Neutrophils Relative %: 78 %
PLATELETS: 174 10*3/uL (ref 150–440)
RBC: 4.68 MIL/uL (ref 3.80–5.20)
RDW: 26.1 % — ABNORMAL HIGH (ref 11.5–14.5)
WBC: 7.8 10*3/uL (ref 3.6–11.0)

## 2016-12-27 LAB — COMPREHENSIVE METABOLIC PANEL
ALT: 23 U/L (ref 14–54)
AST: 29 U/L (ref 15–41)
Albumin: 3.7 g/dL (ref 3.5–5.0)
Alkaline Phosphatase: 81 U/L (ref 38–126)
Anion gap: 11 (ref 5–15)
BILIRUBIN TOTAL: 0.8 mg/dL (ref 0.3–1.2)
BUN: 17 mg/dL (ref 6–20)
CHLORIDE: 98 mmol/L — AB (ref 101–111)
CO2: 31 mmol/L (ref 22–32)
CREATININE: 1.2 mg/dL — AB (ref 0.44–1.00)
Calcium: 9.2 mg/dL (ref 8.9–10.3)
GFR calc Af Amer: 48 mL/min — ABNORMAL LOW (ref >60)
GFR, EST NON AFRICAN AMERICAN: 42 mL/min — AB (ref >60)
Glucose, Bld: 179 mg/dL — ABNORMAL HIGH (ref 65–99)
Potassium: 3.9 mmol/L (ref 3.5–5.1)
Sodium: 140 mmol/L (ref 135–145)
Total Protein: 6.6 g/dL (ref 6.5–8.1)

## 2016-12-27 LAB — GLUCOSE, CAPILLARY
GLUCOSE-CAPILLARY: 123 mg/dL — AB (ref 65–99)
GLUCOSE-CAPILLARY: 195 mg/dL — AB (ref 65–99)
GLUCOSE-CAPILLARY: 205 mg/dL — AB (ref 65–99)
Glucose-Capillary: 180 mg/dL — ABNORMAL HIGH (ref 65–99)

## 2016-12-28 ENCOUNTER — Inpatient Hospital Stay: Payer: Medicare Other

## 2016-12-28 ENCOUNTER — Other Ambulatory Visit: Payer: Self-pay | Admitting: Oncology

## 2016-12-28 ENCOUNTER — Inpatient Hospital Stay: Payer: Medicare Other | Attending: Oncology

## 2016-12-28 VITALS — BP 147/81 | HR 71 | Temp 98.2°F | Resp 18

## 2016-12-28 DIAGNOSIS — D509 Iron deficiency anemia, unspecified: Secondary | ICD-10-CM | POA: Diagnosis present

## 2016-12-28 DIAGNOSIS — Z79899 Other long term (current) drug therapy: Secondary | ICD-10-CM | POA: Diagnosis not present

## 2016-12-28 LAB — GLUCOSE, CAPILLARY: GLUCOSE-CAPILLARY: 183 mg/dL — AB (ref 65–99)

## 2016-12-28 MED ORDER — IRON SUCROSE 20 MG/ML IV SOLN
200.0000 mg | Freq: Once | INTRAVENOUS | Status: AC
  Administered 2016-12-28: 200 mg via INTRAVENOUS
  Filled 2016-12-28: qty 10

## 2016-12-28 MED ORDER — SODIUM CHLORIDE 0.9 % IV SOLN
Freq: Once | INTRAVENOUS | Status: AC
Start: 2016-12-28 — End: 2016-12-28
  Administered 2016-12-28: 12:00:00 via INTRAVENOUS
  Filled 2016-12-28: qty 1000

## 2016-12-29 ENCOUNTER — Ambulatory Visit: Payer: Medicare Other

## 2016-12-29 LAB — GLUCOSE, CAPILLARY
GLUCOSE-CAPILLARY: 194 mg/dL — AB (ref 65–99)
GLUCOSE-CAPILLARY: 152 mg/dL — AB (ref 65–99)
Glucose-Capillary: 142 mg/dL — ABNORMAL HIGH (ref 65–99)
Glucose-Capillary: 219 mg/dL — ABNORMAL HIGH (ref 65–99)

## 2017-01-01 LAB — GLUCOSE, CAPILLARY
GLUCOSE-CAPILLARY: 149 mg/dL — AB (ref 65–99)
GLUCOSE-CAPILLARY: 95 mg/dL (ref 65–99)
GLUCOSE-CAPILLARY: 129 mg/dL — AB (ref 65–99)
GLUCOSE-CAPILLARY: 191 mg/dL — AB (ref 65–99)
GLUCOSE-CAPILLARY: 150 mg/dL — AB (ref 65–99)
GLUCOSE-CAPILLARY: 146 mg/dL — AB (ref 65–99)
GLUCOSE-CAPILLARY: 111 mg/dL — AB (ref 65–99)
GLUCOSE-CAPILLARY: 222 mg/dL — AB (ref 65–99)
Glucose-Capillary: 124 mg/dL — ABNORMAL HIGH (ref 65–99)
Glucose-Capillary: 226 mg/dL — ABNORMAL HIGH (ref 65–99)
Glucose-Capillary: 167 mg/dL — ABNORMAL HIGH (ref 65–99)
Glucose-Capillary: 181 mg/dL — ABNORMAL HIGH (ref 65–99)
Glucose-Capillary: 199 mg/dL — ABNORMAL HIGH (ref 65–99)

## 2017-01-02 LAB — GLUCOSE, CAPILLARY
GLUCOSE-CAPILLARY: 110 mg/dL — AB (ref 65–99)
GLUCOSE-CAPILLARY: 206 mg/dL — AB (ref 65–99)
GLUCOSE-CAPILLARY: 221 mg/dL — AB (ref 65–99)
Glucose-Capillary: 176 mg/dL — ABNORMAL HIGH (ref 65–99)

## 2017-01-03 ENCOUNTER — Non-Acute Institutional Stay (SKILLED_NURSING_FACILITY): Payer: Medicare Other | Admitting: Gerontology

## 2017-01-03 DIAGNOSIS — J189 Pneumonia, unspecified organism: Secondary | ICD-10-CM

## 2017-01-03 LAB — GLUCOSE, CAPILLARY
GLUCOSE-CAPILLARY: 206 mg/dL — AB (ref 65–99)
GLUCOSE-CAPILLARY: 165 mg/dL — AB (ref 65–99)
GLUCOSE-CAPILLARY: 174 mg/dL — AB (ref 65–99)
GLUCOSE-CAPILLARY: 140 mg/dL — AB (ref 65–99)
Glucose-Capillary: 120 mg/dL — ABNORMAL HIGH (ref 65–99)
Glucose-Capillary: 129 mg/dL — ABNORMAL HIGH (ref 65–99)
Glucose-Capillary: 193 mg/dL — ABNORMAL HIGH (ref 65–99)

## 2017-01-04 ENCOUNTER — Inpatient Hospital Stay: Payer: Medicare Other

## 2017-01-04 VITALS — BP 134/73 | HR 73 | Temp 98.5°F | Resp 20

## 2017-01-04 DIAGNOSIS — D509 Iron deficiency anemia, unspecified: Secondary | ICD-10-CM

## 2017-01-04 MED ORDER — SODIUM CHLORIDE 0.9 % IV SOLN
Freq: Once | INTRAVENOUS | Status: AC
  Administered 2017-01-04: 11:00:00 via INTRAVENOUS
  Filled 2017-01-04: qty 1000

## 2017-01-04 MED ORDER — IRON SUCROSE 20 MG/ML IV SOLN
200.0000 mg | Freq: Once | INTRAVENOUS | Status: AC
  Administered 2017-01-04: 200 mg via INTRAVENOUS
  Filled 2017-01-04: qty 10

## 2017-01-04 NOTE — Progress Notes (Signed)
Location:      Place of Service:  SNF (31)  Provider: Toni Arthurs, NP-C  PCP: Letta Median, MD Patient Care Team: Letta Median, MD as PCP - Hooper, FNP as Nurse Practitioner (Family Medicine) Isaias Cowman, MD as Consulting Physician (Cardiology)  Extended Emergency Contact Information Primary Emergency Contact: Donovan Kail, [place] Phone: [phone] Relation: Sister Secondary Emergency Contact: Lisabeth Devoid [place] Phone: [phone] Relation: Grandaughter  Code Status: FULL Goals of care:  Advanced Directive information Advanced Directives 12/26/2016  Does Patient Have a Medical Advance Directive? No  Type of Advance Directive -  Does patient want to make changes to medical advance directive? -  Copy of [place] in Chart? -  Would patient like information on creating a medical advance directive? -     Allergies  Allergen Reactions  . Tramadol Anaphylaxis    Chief Complaint  Patient presents with  . Follow-up    HPI:  82 y.o. female seen today for discharge evaluation. Pt was admitted to the facility for rehab following hospitalization at [facility] for Pneumonia, CHF, and Angioedema- following allergic reaction to Tramadol. Pt has had no further complications from this. She has participated in PT and OT for deconditioning and strengthening. Pt has progressed well. She reports she feels stronger and is ready for discharge. Pt denies n/v/d/f/c/cp/sob/ha/abd pain/dizziness/cough. Pt reports her appetite is good and is having regular BMs. VSS. No other complaints.     Past Medical History:  Diagnosis Date  . Anemia   . Arthritis    hands, knees, back  . Atrial fibrillation ([place])   . CHF (congestive heart failure) ([place])   . COPD (chronic obstructive pulmonary disease) ([place])   . Diabetes mellitus without complication ([place])   . Dyspnea   .  GERD (gastroesophageal reflux disease)   . Hyperlipidemia   . Hypertension   . PONV (postoperative nausea and vomiting)   . Sleep apnea    told she needs CPAP, does not have  . Wears dentures    full upper and lower.  only wears upper    Past Surgical History:  Procedure Laterality Date  . ABDOMINAL HYSTERECTOMY    . CATARACT EXTRACTION W/PHACO Right 07/12/2016   Procedure: CATARACT EXTRACTION PHACO AND INTRAOCULAR LENS PLACEMENT (IOC);  Surgeon: Leandrew Koyanagi, MD;  Location: [facility];  Service: Ophthalmology;  Laterality: Right;  DIABETIC - insulin and oral meds sleep apnea  . CHOLECYSTECTOMY    . COLONOSCOPY N/A 01/19/2015   Procedure: COLONOSCOPY;  Surgeon: Josefine Class, MD;  Location: [facility] ENDOSCOPY;  Service: Endoscopy;  Laterality: N/A;  . ESOPHAGOGASTRODUODENOSCOPY N/A 01/19/2015   Procedure: ESOPHAGOGASTRODUODENOSCOPY (EGD);  Surgeon: Josefine Class, MD;  Location: [facility] ENDOSCOPY;  Service: Endoscopy;  Laterality: N/A;  . SHOULDER ARTHROSCOPY        reports that she has never smoked. She has never used smokeless tobacco. She reports that she does not drink alcohol. Her drug history is not on file. Social History   Social History  . Marital status: Widowed    Spouse name: N/A  . Number of children: N/A  . Years of education: N/A   Occupational History  . Not on file.   Social History Main Topics  . Smoking status: Never Smoker  . Smokeless tobacco: Never Used  . Alcohol use No  . Drug use: Unknown  . Sexual  activity: Not on file   Other Topics Concern  . Not on file   Social History Narrative  . No narrative on file   Functional Status Survey:    Allergies  Allergen Reactions  . Tramadol Anaphylaxis    Pertinent  Health Maintenance Due  Topic Date Due  . DEXA SCAN  03/29/2002  . PNA vac Low Risk Adult (1 of 2 - PCV13) 03/29/2002  . INFLUENZA VACCINE  04/11/2017    Medications: Allergies as of 01/03/2017      Reactions     Tramadol Anaphylaxis      Medication List       Accurate as of 01/03/17 11:59 PM. Always use your most recent med list.          acetaminophen 500 MG tablet Commonly known as:  TYLENOL Take 1,000 mg by mouth every 6 (six) hours as needed for moderate pain or headache.   albuterol 108 (90 Base) MCG/ACT inhaler Commonly known as:  PROVENTIL HFA;VENTOLIN HFA Inhale 2 puffs into the lungs every 6 (six) hours as needed for wheezing or shortness of breath.   amoxicillin-clavulanate 875-125 MG tablet Commonly known as:  AUGMENTIN Take 1 tablet by mouth every 12 (twelve) hours. X 6 more days   atorvastatin 80 MG tablet Commonly known as:  LIPITOR Take 80 mg by mouth daily.   CALCIUM 600 + D PO Take 1 tablet by mouth.   carvedilol 6.25 MG tablet Commonly known as:  COREG Take 1 tablet (6.25 mg total) by mouth 2 (two) times daily with a meal.   cloNIDine 0.1 MG tablet Commonly known as:  CATAPRES Take 0.1 mg by mouth 2 (two) times daily.   esomeprazole 20 MG capsule Commonly known as:  NEXIUM Take 20 mg by mouth at bedtime.   ferrous sulfate 325 (65 FE) MG tablet Take 325 mg by mouth daily with breakfast.   furosemide 20 MG tablet Commonly known as:  LASIX Take 20 mg by mouth 2 (two) times daily.   insulin NPH Human 100 UNIT/ML injection Commonly known as:  HUMULIN N,NOVOLIN N Inject 0.23 mLs (23 Units total) into the skin 2 (two) times daily before a meal.   ipratropium-albuterol 0.5-2.5 (3) MG/3ML Soln Commonly known as:  DUONEB Take 3 mLs by nebulization every 6 (six) hours as needed.   lisinopril 10 MG tablet Commonly known as:  PRINIVIL,ZESTRIL Take 10 mg by mouth daily.   metFORMIN 1000 MG (MOD) 24 hr tablet Commonly known as:  GLUMETZA Take 1,000 mg by mouth 2 (two) times daily.   potassium chloride 10 MEQ tablet Commonly known as:  K-DUR Take 10 mEq by mouth 2 (two) times daily.   senna-docusate 8.6-50 MG tablet Commonly known as:  Senokot-S Take  2 tablets by mouth 2 (two) times daily.       Review of Systems  Constitutional: Negative for activity change, appetite change, chills, diaphoresis and fever.  HENT: Negative for congestion, sneezing, sore throat, trouble swallowing and voice change.   Respiratory: Negative for apnea, cough, choking, chest tightness, shortness of breath and wheezing.   Cardiovascular: Negative for chest pain, palpitations and leg swelling.  Gastrointestinal: Negative for abdominal distention, abdominal pain, constipation, diarrhea and nausea.  Genitourinary: Negative for difficulty urinating, dysuria, frequency and urgency.  Musculoskeletal: Positive for arthralgias (typical arthritis). Negative for back pain, gait problem and myalgias.  Skin: Negative for color change, pallor, rash and wound.  Neurological: Negative for dizziness, tremors, syncope, speech difficulty, weakness, numbness and  headaches.  Psychiatric/Behavioral: Negative for agitation and behavioral problems.  All other systems reviewed and are negative.   Vitals:   01/03/17 0500  BP: (!) 142/68  Pulse: 82  Resp: 20  Temp: 98 F (36.7 C)  SpO2: 99%  Weight: 182 lb 1.6 oz (82.6 kg)   Body mass index is 30.3 kg/m. Physical Exam  Constitutional: She is oriented to person, place, and time. Vital signs are normal. She appears well-developed and well-nourished. She is active and cooperative. She does not appear ill. No distress.  HENT:  Head: Normocephalic and atraumatic.  Mouth/Throat: Uvula is midline, oropharynx is clear and moist and mucous membranes are normal. Mucous membranes are not pale, not dry and not cyanotic.  Eyes: Conjunctivae, EOM and lids are normal. Pupils are equal, round, and reactive to light.  Neck: Trachea normal, normal range of motion and full passive range of motion without pain. Neck supple. No JVD present. No tracheal deviation, no edema and no erythema present. No thyromegaly present.  Cardiovascular: Normal  rate, regular rhythm, normal heart sounds, intact distal pulses and normal pulses.  Exam reveals no gallop, no distant heart sounds and no friction rub.   No murmur heard. Pulmonary/Chest: Effort normal and breath sounds normal. No accessory muscle usage. No respiratory distress. She has no wheezes. She has no rales. She exhibits no tenderness.  Abdominal: Normal appearance and bowel sounds are normal. She exhibits no distension and no ascites. There is no tenderness.  Musculoskeletal: Normal range of motion. She exhibits no edema or tenderness.  Expected osteoarthritis, stiffness  Neurological: She is alert and oriented to person, place, and time. She has normal strength.  Skin: Skin is warm, dry and intact. No rash noted. She is not diaphoretic. No cyanosis or erythema. No pallor. Nails show no clubbing.  Psychiatric: She has a normal mood and affect. Her speech is normal and behavior is normal. Judgment and thought content normal. Cognition and memory are normal.  Nursing note and vitals reviewed.   Labs reviewed: Basic Metabolic Panel:  Recent Labs  12/20/16 0429 12/21/16 0519 12/27/16 1038  NA 140 142 140  K 4.3 3.9 3.9  CL 105 102 98*  CO2 28 32 31  GLUCOSE 267* 220* 179*  BUN 35* 35* 17  CREATININE 1.28* 1.30* 1.20*  CALCIUM 8.5* 8.5* 9.2   Liver Function Tests:  Recent Labs  12/18/16 1942 12/27/16 1038  AST 40 29  ALT 25 23  ALKPHOS 85 81  BILITOT 1.5* 0.8  PROT 6.8 6.6  ALBUMIN 3.7 3.7   No results for input(s): LIPASE, AMYLASE in the last 8760 hours. No results for input(s): AMMONIA in the last 8760 hours. CBC:  Recent Labs  12/18/16 1942  12/20/16 0429 12/20/16 1432 12/21/16 0519 12/27/16 1038  WBC 17.2*  < > 6.4  --  6.3 7.8  NEUTROABS 14.5*  --   --   --   --  6.0  HGB 7.3*  < > 6.9* 8.5* 7.8* 10.6*  HCT 25.3*  < > 22.5*  --  25.9* 35.0  MCV 68.6*  < > 69.8*  --  70.6* 74.9*  PLT 299  < > 167  --  179 174  < > = values in this interval not  displayed. Cardiac Enzymes:  Recent Labs  09/03/16 2305 09/04/16 0206 12/18/16 1942  TROPONINI 0.03* 0.03* 0.03*   BNP: Invalid input(s): POCBNP CBG:  Recent Labs  01/03/17 0724 01/03/17 1136 01/03/17 1642  GLUCAP 129* 193* 140*  Procedures and Imaging Studies During Stay: Dg Chest 2 View  Result Date: 12/21/2016 CLINICAL DATA:  History of recent pneumonia EXAM: CHEST  2 VIEW COMPARISON:  Portable chest x-ray of 12/18/2016 FINDINGS: The pulmonary vascular congestion has resolved. No definite pneumonia is seen. Linear scarring or atelectasis remains bilaterally. Cardiomegaly is stable. No bony abnormality is seen. IMPRESSION: Resolution of pulmonary vascular congestion. Stable linear scarring bilaterally. Stable cardiomegaly Electronically Signed   By: Ivar Drape M.D.   On: 12/21/2016 11:31   Dg Lumbar Spine Complete  Result Date: 12/17/2016 CLINICAL DATA:  Fall.  Low back pain. EXAM: LUMBAR SPINE - COMPLETE 4+ VIEW COMPARISON:  05/23/2014 lumbar spine radiographs. FINDINGS: This report assumes 5 non rib-bearing lumbar vertebrae. Stable mild chronic T12 vertebral compression fracture. Lumbar vertebral body heights are preserved, with no fracture. Mild-to-moderate multilevel degenerative disc disease in the visualized thoracolumbar spine, most prominent at L1-2 and L5-S1, unchanged. Minimal 2 mm retrolisthesis at L1-2, L2-3 and L3-4, unchanged. No new spondylolisthesis. Bilateral facet arthropathy in the lower lumbar spine. No aggressive appearing focal osseous lesions. Abdominal aortic atherosclerosis. Surgical clips and sutures overlie the bilateral abdomen. IMPRESSION: 1. No lumbar spine fracture or acute malalignment . 2. Stable chronic mild T12 vertebral compression fracture. 3. Mild-to-moderate multilevel degenerative disc disease, unchanged . 4. Bilateral facet arthropathy in the lower lumbar spine. 5. Minimal multilevel spondylolisthesis in the lumbar spine as detailed,  unchanged. 6. Aortic atherosclerosis. Electronically Signed   By: Ilona Sorrel M.D.   On: 12/17/2016 18:31   Ct Head Wo Contrast  Result Date: 12/17/2016 CLINICAL DATA:  Unwitnessed fall EXAM: CT HEAD WITHOUT CONTRAST TECHNIQUE: Contiguous axial images were obtained from the base of the skull through the vertex without intravenous contrast. COMPARISON:  None. FINDINGS: Brain: No evidence of acute infarction, hemorrhage, hydrocephalus, extra-axial collection. 10 mm calcified right frontal meningioma (coronal image 12), without underlying mass effect or vasogenic edema. No midline shift. Vascular: Intracranial atherosclerosis. Skull: Normal. Negative for fracture or focal lesion. Sinuses/Orbits: The visualized paranasal sinuses are essentially clear. The mastoid air cells are unopacified. Other: None. IMPRESSION: No evidence of acute intracranial abnormality. Electronically Signed   By: Julian Hy M.D.   On: 12/17/2016 18:46   Dg Chest Portable 1 View  Result Date: 12/18/2016 CLINICAL DATA:  Allergic reaction and respiratory distress EXAM: PORTABLE CHEST 1 VIEW COMPARISON:  09/03/2016 FINDINGS: Cardiac shadow remains enlarged. Mild vascular congestion is noted. Mild interstitial edema is seen. Some patchy right upper lobe infiltrate is noted. No sizable effusion is seen. No bony abnormality is noted. IMPRESSION: Vascular congestion with mild interstitial edema and patchy right upper lobe infiltrate. Electronically Signed   By: Inez Catalina M.D.   On: 12/18/2016 20:13    Assessment/Plan:   1. Community acquired pneumonia, unspecified laterality  Resolved  Continue PT/OT at home  Continue exercises as taught by PT/OT  Follow up with PCP asap after discharge for continuity of care    Patient is being discharged with the following home health services: HHPT/OT/Nsg   Patient is being discharged with the following durable medical equipment: none  Patient has been advised to f/u with their  PCP in 1-2 weeks to bring them up to date on their rehab stay.  Social services at facility was responsible for arranging this appointment.  Pt was provided with a 30 day supply of prescriptions for medications and refills must be obtained from their PCP.  For controlled substances, a more limited supply may be provided adequate until  PCP appointment only.  Future labs/tests needed:    Family/ staff Communication:   Total Time:  Documentation:  Face to Face:  Family/Phone:  Vikki Ports, NP-C [facility] [address],  [id number] Cell Phone (Mon-Fri 8am-5pm):  [phone] On Call:  [phone] & follow prompts after 5pm & weekends Office Phone:  [phone] Office Fax:  [phone]

## 2017-01-05 LAB — GLUCOSE, CAPILLARY
GLUCOSE-CAPILLARY: 228 mg/dL — AB (ref 65–99)
GLUCOSE-CAPILLARY: 155 mg/dL — AB (ref 65–99)
GLUCOSE-CAPILLARY: 51 mg/dL — AB (ref 65–99)
Glucose-Capillary: 107 mg/dL — ABNORMAL HIGH (ref 65–99)
Glucose-Capillary: 118 mg/dL — ABNORMAL HIGH (ref 65–99)
Glucose-Capillary: 181 mg/dL — ABNORMAL HIGH (ref 65–99)

## 2017-01-08 LAB — GLUCOSE, CAPILLARY
GLUCOSE-CAPILLARY: 60 mg/dL — AB (ref 65–99)
Glucose-Capillary: 198 mg/dL — ABNORMAL HIGH (ref 65–99)
Glucose-Capillary: 204 mg/dL — ABNORMAL HIGH (ref 65–99)

## 2017-01-11 ENCOUNTER — Other Ambulatory Visit: Payer: Medicare Other

## 2017-01-11 ENCOUNTER — Ambulatory Visit: Payer: Medicare Other | Admitting: Oncology

## 2017-01-11 ENCOUNTER — Ambulatory Visit: Payer: Medicare Other

## 2017-01-19 ENCOUNTER — Inpatient Hospital Stay: Payer: Medicare Other

## 2017-01-19 ENCOUNTER — Encounter: Payer: Self-pay | Admitting: Oncology

## 2017-01-19 ENCOUNTER — Inpatient Hospital Stay: Payer: Medicare Other | Attending: Oncology

## 2017-01-19 ENCOUNTER — Inpatient Hospital Stay (HOSPITAL_BASED_OUTPATIENT_CLINIC_OR_DEPARTMENT_OTHER): Payer: Medicare Other | Admitting: Oncology

## 2017-01-19 VITALS — BP 168/98 | HR 76 | Temp 97.0°F | Resp 20 | Wt 183.2 lb

## 2017-01-19 DIAGNOSIS — J440 Chronic obstructive pulmonary disease with acute lower respiratory infection: Secondary | ICD-10-CM | POA: Insufficient documentation

## 2017-01-19 DIAGNOSIS — E538 Deficiency of other specified B group vitamins: Secondary | ICD-10-CM

## 2017-01-19 DIAGNOSIS — E785 Hyperlipidemia, unspecified: Secondary | ICD-10-CM | POA: Insufficient documentation

## 2017-01-19 DIAGNOSIS — D509 Iron deficiency anemia, unspecified: Secondary | ICD-10-CM

## 2017-01-19 DIAGNOSIS — Z79899 Other long term (current) drug therapy: Secondary | ICD-10-CM | POA: Insufficient documentation

## 2017-01-19 DIAGNOSIS — K219 Gastro-esophageal reflux disease without esophagitis: Secondary | ICD-10-CM | POA: Insufficient documentation

## 2017-01-19 DIAGNOSIS — I4891 Unspecified atrial fibrillation: Secondary | ICD-10-CM | POA: Insufficient documentation

## 2017-01-19 DIAGNOSIS — I110 Hypertensive heart disease with heart failure: Secondary | ICD-10-CM | POA: Diagnosis not present

## 2017-01-19 DIAGNOSIS — I509 Heart failure, unspecified: Secondary | ICD-10-CM | POA: Diagnosis not present

## 2017-01-19 LAB — IRON AND TIBC
Iron: 57 ug/dL (ref 28–170)
Saturation Ratios: 18 % (ref 10.4–31.8)
TIBC: 309 ug/dL (ref 250–450)
UIBC: 252 ug/dL

## 2017-01-19 LAB — CBC
HCT: 38.4 % (ref 35.0–47.0)
Hemoglobin: 12.2 g/dL (ref 12.0–16.0)
MCH: 25.3 pg — AB (ref 26.0–34.0)
MCHC: 31.8 g/dL — AB (ref 32.0–36.0)
MCV: 79.6 fL — AB (ref 80.0–100.0)
PLATELETS: 193 10*3/uL (ref 150–440)
RBC: 4.82 MIL/uL (ref 3.80–5.20)
RDW: 28.1 % — AB (ref 11.5–14.5)
WBC: 5.9 10*3/uL (ref 3.6–11.0)

## 2017-01-19 LAB — FERRITIN: FERRITIN: 154 ng/mL (ref 11–307)

## 2017-01-19 MED ORDER — IRON SUCROSE 20 MG/ML IV SOLN
200.0000 mg | Freq: Once | INTRAVENOUS | Status: AC
Start: 2017-01-19 — End: 2017-01-19
  Administered 2017-01-19: 200 mg via INTRAVENOUS
  Filled 2017-01-19: qty 10

## 2017-01-19 MED ORDER — SODIUM CHLORIDE 0.9 % IV SOLN: 200.0000 mg | Freq: Once | INTRAVENOUS | Status: DC

## 2017-01-19 MED ORDER — CYANOCOBALAMIN 1000 MCG/ML IJ SOLN
1000.0000 ug | Freq: Once | INTRAMUSCULAR | Status: AC
  Administered 2017-01-19: 1000 ug via INTRAMUSCULAR
  Filled 2017-01-19: qty 1

## 2017-01-19 MED ORDER — SODIUM CHLORIDE 0.9 % IV SOLN
Freq: Once | INTRAVENOUS | Status: AC
  Administered 2017-01-19: 12:00:00 via INTRAVENOUS
  Filled 2017-01-19: qty 1000

## 2017-01-19 NOTE — Progress Notes (Signed)
Hematology/Oncology Consult note [facility]  Telephone:([phone] [phone]  Patient Care Team: Letta Median, MD as PCP - General Jackelyn Hoehn, Aura Fey, FNP as Nurse Practitioner (Family Medicine) Isaias Cowman, MD as Consulting Physician (Cardiology)   Name of the patient: Dorothy [name]  [id number]  1934/10/16   Date of visit: 01/19/17  Diagnosis- iron deficiency anemia  Chief complaint/ Reason for visit- routine f/u  Heme/Onc history: patient is a 82 year old female who was seen by Dr. Nani Skillern 2016) deficiency anemia. At that time she had an EGD and a colonoscopy which did not reveal any evidence of bleeding. She didn't get better with oral iron and was eventually asked to follow-up with her primary care doctor. Patient was admitted to the hospital for severe allergy angioedema and was found to have a pneumonia and heart failure. Her hemoglobin was 7.3 with an MCV of 68. She has received 2 doses of benefit for 200 mg along with one unit of blood transfusion. She has been seen by GI Dr. Vicente Males who plans to scope her as an outpatient after her acute medical issues resolved.   Iron studies from 12/19/2016 showed a low serum iron of 13 and low iron saturation of 3% and normal TIBC of 426. Ferritin was low at 9 and B12 was normal at 151. Patient has received 3 doses of benefit so far and one dose of B12 in April 2018    Interval history- Patient recently came out of the rehabilitation a couple of weeks ago since her hospital discharge. Overall she is feeling better in terms of her energy levels and ambulating with a walker. She currently lives at her sister's house and hopes to go back to independent living. She did have issues with hypoglycemia last week and had to call 911 when her blood sugars drop down to 40s. This morning her blood sugar was in the 50s.   Review of systems- Review of Systems  Constitutional: Positive for malaise/fatigue.  Negative for chills, fever and weight loss.  HENT: Negative for congestion, ear discharge and nosebleeds.   Eyes: Negative for blurred vision.  Respiratory: Negative for cough, hemoptysis, sputum production, shortness of breath and wheezing.   Cardiovascular: Negative for chest pain, palpitations, orthopnea and claudication.  Gastrointestinal: Negative for abdominal pain, blood in stool, constipation, diarrhea, heartburn, melena, nausea and vomiting.  Genitourinary: Negative for dysuria, flank pain, frequency, hematuria and urgency.  Musculoskeletal: Negative for back pain, joint pain and myalgias.  Skin: Negative for rash.  Neurological: Negative for dizziness, tingling, focal weakness, seizures, weakness and headaches.  Endo/Heme/Allergies: Does not bruise/bleed easily.  Psychiatric/Behavioral: Negative for depression and suicidal ideas. The patient does not have insomnia.       Allergies  Allergen Reactions  . Tramadol Anaphylaxis     Past Medical History:  Diagnosis Date  . Anemia   . Arthritis    hands, knees, back  . Atrial fibrillation ([place])   . CHF (congestive heart failure) ([place])   . COPD (chronic obstructive pulmonary disease) ([place])   . Diabetes mellitus without complication ([place])   . Dyspnea   . GERD (gastroesophageal reflux disease)   . Hyperlipidemia   . Hypertension   . PONV (postoperative nausea and vomiting)   . Sleep apnea    told she needs CPAP, does not have  . Wears dentures    full upper and lower.  only wears upper     Past Surgical History:  Procedure Laterality Date  .  ABDOMINAL HYSTERECTOMY    . CATARACT EXTRACTION W/PHACO Right 07/12/2016   Procedure: CATARACT EXTRACTION PHACO AND INTRAOCULAR LENS PLACEMENT (IOC);  Surgeon: Leandrew Koyanagi, MD;  Location: [facility];  Service: Ophthalmology;  Laterality: Right;  DIABETIC - insulin and oral meds sleep apnea  . CHOLECYSTECTOMY    . COLONOSCOPY N/A 01/19/2015   Procedure:  COLONOSCOPY;  Surgeon: Josefine Class, MD;  Location[facility] ENDOSCOPY;  Service: Endoscopy;  Laterality: N/A;  . ESOPHAGOGASTRODUODENOSCOPY N/A 01/19/2015   Procedure: ESOPHAGOGASTRODUODENOSCOPY (EGD);  Surgeon: Josefine Class, MD;  Location: [facility] ENDOSCOPY;  Service: Endoscopy;  Laterality: N/A;  . SHOULDER ARTHROSCOPY      Social History   Social History  . Marital status: Widowed    Spouse name: N/A  . Number of children: N/A  . Years of education: N/A   Occupational History  . Not on file.   Social History Main Topics  . Smoking status: Never Smoker  . Smokeless tobacco: Never Used  . Alcohol use No  . Drug use: Unknown  . Sexual activity: Not on file   Other Topics Concern  . Not on file   Social History Narrative  . No narrative on file    No family history on file.   Current Outpatient Prescriptions:  .  acetaminophen (TYLENOL) 500 MG tablet, Take 1,000 mg by mouth every 6 (six) hours as needed for moderate pain or headache., Disp: , Rfl:  .  atorvastatin (LIPITOR) 80 MG tablet, Take 80 mg by mouth daily., Disp: , Rfl:  .  Calcium Carb-Cholecalciferol (CALCIUM 600 + D PO), Take 1 tablet by mouth., Disp: , Rfl:  .  carvedilol (COREG) 6.25 MG tablet, Take 1 tablet (6.25 mg total) by mouth 2 (two) times daily with a meal., Disp: 60 tablet, Rfl: 2 .  cloNIDine (CATAPRES) 0.1 MG tablet, Take 0.1 mg by mouth 2 (two) times daily., Disp: , Rfl:  .  esomeprazole (NEXIUM) 20 MG capsule, Take 20 mg by mouth at bedtime., Disp: , Rfl:  .  ferrous sulfate 325 (65 FE) MG tablet, Take 325 mg by mouth daily with breakfast., Disp: , Rfl:  .  furosemide (LASIX) 20 MG tablet, Take 20 mg by mouth 2 (two) times daily., Disp: , Rfl:  .  insulin NPH Human (HUMULIN N,NOVOLIN N) 100 UNIT/ML injection, Inject 0.23 mLs (23 Units total) into the skin 2 (two) times daily before a meal., Disp: 10 mL, Rfl: 11 .  lisinopril (PRINIVIL,ZESTRIL) 10 MG tablet, Take 10 mg by mouth daily., Disp:  , Rfl:  .  metFORMIN (GLUMETZA) 1000 MG (MOD) 24 hr tablet, Take 1,000 mg by mouth 2 (two) times daily., Disp: , Rfl:  .  potassium chloride (K-DUR) 10 MEQ tablet, Take 10 mEq by mouth 2 (two) times daily., Disp: , Rfl:  .  senna-docusate (SENOKOT-S) 8.6-50 MG tablet, Take 2 tablets by mouth 2 (two) times daily., Disp: 30 tablet, Rfl: 0 .  albuterol (PROVENTIL HFA;VENTOLIN HFA) 108 (90 BASE) MCG/ACT inhaler, Inhale 2 puffs into the lungs every 6 (six) hours as needed for wheezing or shortness of breath., Disp: , Rfl:  .  amoxicillin-clavulanate (AUGMENTIN) 875-125 MG tablet, Take 1 tablet by mouth every 12 (twelve) hours. X 6 more days (Patient not taking: Reported on 01/19/2017), Disp: 12 tablet, Rfl: 0 .  ipratropium-albuterol (DUONEB) 0.5-2.5 (3) MG/3ML SOLN, Take 3 mLs by nebulization every 6 (six) hours as needed. (Patient not taking: Reported on 01/19/2017), Disp: 360 mL, Rfl: 0 No current  facility-administered medications for this visit.   Facility-Administered Medications Ordered in Other Visits:  .  0.9 %  sodium chloride infusion, , Intravenous, Once, Sindy Guadeloupe, MD .  cyanocobalamin ((VITAMIN B-12)) injection 1,000 mcg, 1,000 mcg, Intramuscular, Once, Sindy Guadeloupe, MD .  iron sucrose (VENOFER) injection 200 mg, 200 mg, Intravenous, Once, Sindy Guadeloupe, MD  Physical exam:  Vitals:   01/19/17 1103  BP: (!) 168/98  Pulse: 76  Resp: 20  Temp: 97 F (36.1 C)  TempSrc: Tympanic  Weight: 183 lb 3.2 oz (83.1 kg)   Physical Exam  Constitutional: She is oriented to person, place, and time.  Elderly frail woman in no acute distress. She ambulates with a walker  HENT:  Head: Normocephalic and atraumatic.  Eyes: EOM are normal. Pupils are equal, round, and reactive to light.  Neck: Normal range of motion.  Cardiovascular: Normal rate and normal heart sounds.   Hr irregular  Pulmonary/Chest: Effort normal and breath sounds normal.  Abdominal: Soft. Bowel sounds are normal.    Neurological: She is alert and oriented to person, place, and time.  Skin: Skin is warm and dry.     CMP Latest Ref Rng & Units 12/27/2016  Glucose 65 - 99 mg/dL 179(H)  BUN 6 - 20 mg/dL 17  Creatinine 0.44 - 1.00 mg/dL 1.20(H)  Sodium 135 - 145 mmol/L 140  Potassium 3.5 - 5.1 mmol/L 3.9  Chloride 101 - 111 mmol/L 98(L)  CO2 22 - 32 mmol/L 31  Calcium 8.9 - 10.3 mg/dL 9.2  Total Protein 6.5 - 8.1 g/dL 6.6  Total Bilirubin 0.3 - 1.2 mg/dL 0.8  Alkaline Phos 38 - 126 U/L 81  AST 15 - 41 U/L 29  ALT 14 - 54 U/L 23   CBC Latest Ref Rng & Units 01/19/2017  WBC 3.6 - 11.0 K/uL 5.9  Hemoglobin 12.0 - 16.0 g/dL 12.2  Hematocrit 35.0 - 47.0 % 38.4  Platelets 150 - 440 K/uL 193    No images are attached to the encounter.  Dg Chest 2 View  Result Date: 12/21/2016 CLINICAL DATA:  History of recent pneumonia EXAM: CHEST  2 VIEW COMPARISON:  Portable chest x-ray of 12/18/2016 FINDINGS: The pulmonary vascular congestion has resolved. No definite pneumonia is seen. Linear scarring or atelectasis remains bilaterally. Cardiomegaly is stable. No bony abnormality is seen. IMPRESSION: Resolution of pulmonary vascular congestion. Stable linear scarring bilaterally. Stable cardiomegaly Electronically Signed   By: Ivar Drape M.D.   On: 12/21/2016 11:31     Assessment and plan- Patient is a 82 y.o. female with history of iron deficiency anemia of unclear etiology GI evaluation pending  1. Patient's H&H has significantly improved 12.2/38.4. Her iron studies are normal. I will proceed with fourth dose of Namenda for today she will also continue to get monthly B12 injections. She does have a follow-up with GI next week but doesn't think that she can up that appointment due to transportation issues and would likely be scheduled. Patient is not interested in getting a colonoscopy at this time. I will see her back in 4 months time with CBC ferritin and iron studies but we will check interim labs in 2 months    Visit Diagnosis 1. B12 deficiency   2. Iron deficiency anemia, unspecified iron deficiency anemia type      Dr. Randa Evens, MD, MPH [facility] at [facility] Pager- [phone] 01/19/2017 11:51 AM

## 2017-01-19 NOTE — Progress Notes (Signed)
Patient complains of decreased appetite and blood sugar running low, was taken off of the glucotrol recently.

## 2017-01-24 ENCOUNTER — Encounter: Payer: Self-pay | Admitting: Gastroenterology

## 2017-01-24 ENCOUNTER — Ambulatory Visit (INDEPENDENT_AMBULATORY_CARE_PROVIDER_SITE_OTHER): Payer: Medicare Other | Admitting: Gastroenterology

## 2017-01-24 VITALS — BP 153/83 | HR 76 | Temp 98.1°F | Ht 65.0 in | Wt 184.0 lb

## 2017-01-24 DIAGNOSIS — D509 Iron deficiency anemia, unspecified: Secondary | ICD-10-CM | POA: Diagnosis not present

## 2017-01-24 NOTE — Progress Notes (Addendum)
Primary Care Physician: Letta Median, MD  Primary Gastroenterologist:  Dr. Jonathon Bellows   Chief Complaint  Patient presents with  . Anemia    HOSPITAL FOLLOW UP    HPI: Dorothy [name] is a 82 y.o. female   She is here today for a hospital follow up   Summary of history :  She was admitted in the beginning of April 2018 with a pneumonia ,heart failure and angioedema and was found to have microcytic anemia .Hb at 7.3 grams, MCV 68 . CBC in 08/2016 , he had a Hb of 9.1 and MCV 74 He was on Eloquis.  In  01/2015 he had a colonoscopy for iron deficiency anemia and found no lesions contributing to the same . At the same time he also had an EGD which was normal except for a Bilroth 1 surgery.The plan at that time was for a capsule study to evaluate the small bowel .  which she did not pursue . My plan was for EGD+colonoscopy +/- capsule study as an outpatient once recovered from the pneumonia and heart failure .   Interval history   12/19/2016-  01/24/2017   Was noted to also have low B 12 levels and given IM B12, received IV iron for severe iron deficiency as inpatient.   Labs 01/19/17 - Hb 12.2, MCV 79 . Celiac serology was negative. No NSAID's, brown stool , no overt blood.    Current Outpatient Prescriptions  Medication Sig Dispense Refill  . acetaminophen (TYLENOL) 500 MG tablet Take 1,000 mg by mouth every 6 (six) hours as needed for moderate pain or headache.    . albuterol (PROVENTIL HFA;VENTOLIN HFA) 108 (90 BASE) MCG/ACT inhaler Inhale 2 puffs into the lungs every 6 (six) hours as needed for wheezing or shortness of breath.    Marland Kitchen atorvastatin (LIPITOR) 80 MG tablet Take 80 mg by mouth daily.    . Calcium Carb-Cholecalciferol (CALCIUM 600 + D PO) Take 1 tablet by mouth.    . carvedilol (COREG) 6.25 MG tablet Take 1 tablet (6.25 mg total) by mouth 2 (two) times daily with a meal. 60 tablet 2  . cloNIDine (CATAPRES) 0.1 MG tablet Take 0.1 mg by mouth 2 (two) times daily.    Marland Kitchen  esomeprazole (NEXIUM) 20 MG capsule Take 20 mg by mouth at bedtime.    . ferrous sulfate 325 (65 FE) MG tablet Take 325 mg by mouth daily with breakfast.    . furosemide (LASIX) 20 MG tablet Take 20 mg by mouth 2 (two) times daily.    . insulin NPH Human (HUMULIN N,NOVOLIN N) 100 UNIT/ML injection Inject 0.23 mLs (23 Units total) into the skin 2 (two) times daily before a meal. 10 mL 11  . ipratropium-albuterol (DUONEB) 0.5-2.5 (3) MG/3ML SOLN Take 3 mLs by nebulization every 6 (six) hours as needed. 360 mL 0  . lisinopril (PRINIVIL,ZESTRIL) 10 MG tablet Take 10 mg by mouth daily.    . metFORMIN (GLUMETZA) 1000 MG (MOD) 24 hr tablet Take 1,000 mg by mouth 2 (two) times daily.    . potassium chloride (K-DUR) 10 MEQ tablet Take 10 mEq by mouth 2 (two) times daily.    Marland Kitchen amoxicillin-clavulanate (AUGMENTIN) 875-125 MG tablet Take 1 tablet by mouth every 12 (twelve) hours. X 6 more days (Patient not taking: Reported on 01/19/2017) 12 tablet 0  . senna-docusate (SENOKOT-S) 8.6-50 MG tablet Take 2 tablets by mouth 2 (two) times daily. (Patient not taking: Reported on 01/24/2017) 30 tablet  0   No current facility-administered medications for this visit.     Allergies as of 01/24/2017 - Review Complete 01/24/2017  Allergen Reaction Noted  . Tramadol Anaphylaxis 12/18/2016    ROS:  General: Negative for anorexia, weight loss, fever, chills, fatigue, weakness. ENT: Negative for hoarseness, difficulty swallowing , nasal congestion. CV: Negative for chest pain, angina, palpitations, dyspnea on exertion, peripheral edema.  Respiratory: Negative for dyspnea at rest, dyspnea on exertion, cough, sputum, wheezing.  GI: See history of present illness. GU:  Negative for dysuria, hematuria, urinary incontinence, urinary frequency, nocturnal urination.  Endo: Negative for unusual weight change.    Physical Examination:   BP (!) 153/83   Pulse 76   Temp 98.1 F (36.7 C) (Oral)   Ht 5\' 5"  (1.651 m)   Wt 184  lb (83.5 kg)   BMI 30.62 kg/m   General: Walks with a walker , well-developed in no acute distress.  Eyes: No icterus. Conjunctivae pink. Mouth: Oropharyngeal mucosa moist and pink , no lesions erythema or exudate. Lungs: Clear to auscultation bilaterally. Non-labored. Heart: Regular rate and rhythm, no murmurs rubs or gallops.  Abdomen: Bowel sounds are normal, nontender, nondistended, no hepatosplenomegaly or masses, no abdominal bruits or hernia , no rebound or guarding.   Extremities: No lower extremity edema. No clubbing or deformities. Neuro: Alert and oriented x 3.  Grossly intact. Skin: Warm and dry, no jaundice.   Psych: Alert and cooperative, normal mood and affect.   Imaging Studies: No results found.  Assessment and Plan:   Dorothy [name] is a 82 y.o. y/o female here to follow up for iron deficiency anemia - no overt blood loss . Hb has improved with IV iron . She is absolutely not interested with any endoscopy at this time and is aware that she could have undiagnosed GI neoplasm. She will call me if she changes her mind.   Dr Jonathon Bellows  MD Follow up as needed

## 2017-01-25 ENCOUNTER — Ambulatory Visit: Payer: Medicare Other | Admitting: Family

## 2017-02-02 ENCOUNTER — Ambulatory Visit: Payer: Medicare Other | Attending: Family | Admitting: Family

## 2017-02-02 ENCOUNTER — Encounter: Payer: Self-pay | Admitting: Family

## 2017-02-02 VITALS — BP 185/58 | HR 60 | Resp 20 | Ht 65.0 in | Wt 176.0 lb

## 2017-02-02 DIAGNOSIS — K219 Gastro-esophageal reflux disease without esophagitis: Secondary | ICD-10-CM | POA: Insufficient documentation

## 2017-02-02 DIAGNOSIS — I480 Paroxysmal atrial fibrillation: Secondary | ICD-10-CM

## 2017-02-02 DIAGNOSIS — E119 Type 2 diabetes mellitus without complications: Secondary | ICD-10-CM | POA: Diagnosis not present

## 2017-02-02 DIAGNOSIS — G4733 Obstructive sleep apnea (adult) (pediatric): Secondary | ICD-10-CM | POA: Insufficient documentation

## 2017-02-02 DIAGNOSIS — D649 Anemia, unspecified: Secondary | ICD-10-CM | POA: Insufficient documentation

## 2017-02-02 DIAGNOSIS — I4891 Unspecified atrial fibrillation: Secondary | ICD-10-CM | POA: Diagnosis not present

## 2017-02-02 DIAGNOSIS — I110 Hypertensive heart disease with heart failure: Secondary | ICD-10-CM | POA: Insufficient documentation

## 2017-02-02 DIAGNOSIS — E785 Hyperlipidemia, unspecified: Secondary | ICD-10-CM | POA: Diagnosis not present

## 2017-02-02 DIAGNOSIS — J449 Chronic obstructive pulmonary disease, unspecified: Secondary | ICD-10-CM | POA: Insufficient documentation

## 2017-02-02 DIAGNOSIS — I10 Essential (primary) hypertension: Secondary | ICD-10-CM

## 2017-02-02 DIAGNOSIS — I5032 Chronic diastolic (congestive) heart failure: Secondary | ICD-10-CM | POA: Diagnosis present

## 2017-02-02 NOTE — Patient Instructions (Signed)
Continue weighing daily and call for an overnight weight gain of > 2 pounds or a weekly weight gain of >5 pounds. 

## 2017-02-02 NOTE — Progress Notes (Signed)
Patient ID: Dorothy [name], female    DOB: 1935-04-02, 82 y.o.   MRN: [id number]  HPI  Ms Lasker is a 82 y/o female with a history of anemia, atrial fibrillation, COPD, DM, GERD, hyperlipidemia, HTN, obstructive sleep apnea (unable to wear CPAP due to cost) and chronic heart failure.   Reviewed last echo report done on 12/19/16 and it showed an EF of 55-60% along with moderate MR and mod/severe TR.   Admitted 12/18/16 with acute on chronic anemia along with angioedema related to tramadol. GI & hematology consults obtained. Received IV iron for 3 days along with 2 units of PRBC's. Angioedema resolved on its own. Discharged home after 4 days. Was in the ED 12/17/16 after a mechanical fall. Xrays were obtained and she was discharged home. Was in the ED 09/03/16 with leg edema. BNP unremarkable. Patient discharged home with instructions to take her furosemide as prescribed.   She presents today for her follow-up visit with a chief complaint of minimal fatigue with moderate exertion. She's been experiencing fatigue off and on for many months. When she gets tired, she'll stop what she's doing to rest until her energy level improves which occurs fairly quickly. She has associated light-headedness along with this.   Past Medical History:  Diagnosis Date  . Anemia   . Arthritis    hands, knees, back  . Atrial fibrillation ([place])   . CHF (congestive heart failure) ([place])   . COPD (chronic obstructive pulmonary disease) ([place])   . Diabetes mellitus without complication ([place])   . Dyspnea   . GERD (gastroesophageal reflux disease)   . Hyperlipidemia   . Hypertension   . PONV (postoperative nausea and vomiting)   . Sleep apnea    told she needs CPAP, does not have  . Wears dentures    full upper and lower.  only wears upper   Past Surgical History:  Procedure Laterality Date  . ABDOMINAL HYSTERECTOMY    . CATARACT EXTRACTION W/PHACO Right 07/12/2016   Procedure: CATARACT EXTRACTION PHACO AND  INTRAOCULAR LENS PLACEMENT (IOC);  Surgeon: Leandrew Koyanagi, MD;  Location: [place];  Service: Ophthalmology;  Laterality: Right;  DIABETIC - insulin and oral meds sleep apnea  . CHOLECYSTECTOMY    . COLONOSCOPY N/A 01/19/2015   Procedure: COLONOSCOPY;  Surgeon: Josefine Class, MD;  Location: [facility] ENDOSCOPY;  Service: Endoscopy;  Laterality: N/A;  . ESOPHAGOGASTRODUODENOSCOPY N/A 01/19/2015   Procedure: ESOPHAGOGASTRODUODENOSCOPY (EGD);  Surgeon: Josefine Class, MD;  Location[facility] ENDOSCOPY;  Service: Endoscopy;  Laterality: N/A;  . SHOULDER ARTHROSCOPY     No family history on file. Social History  Substance Use Topics  . Smoking status: Never Smoker  . Smokeless tobacco: Never Used  . Alcohol use No   Allergies  Allergen Reactions  . Tramadol Anaphylaxis   Prior to Admission medications   Medication Sig Start Date End Date Taking? Authorizing Provider  acetaminophen (TYLENOL) 500 MG tablet Take 1,000 mg by mouth every 6 (six) hours as needed for moderate pain or headache.   Yes Historical Provider, MD  albuterol (PROVENTIL HFA;VENTOLIN HFA) 108 (90 BASE) MCG/ACT inhaler Inhale 2 puffs into the lungs every 6 (six) hours as needed for wheezing or shortness of breath.   Yes Historical Provider, MD  amoxicillin-clavulanate (AUGMENTIN) 875-125 MG tablet Take 1 tablet by mouth every 12 (twelve) hours. X 6 more days 12/22/16  Yes Gladstone Lighter, MD  atorvastatin (LIPITOR) 80 MG tablet Take 80 mg by mouth daily.  Yes Historical Provider, MD  Calcium Carb-Cholecalciferol (CALCIUM 600 + D PO) Take 1 tablet by mouth.   Yes Historical Provider, MD  carvedilol (COREG) 6.25 MG tablet Take 1 tablet (6.25 mg total) by mouth 2 (two) times daily with a meal. 12/22/16  Yes Gladstone Lighter, MD  cloNIDine (CATAPRES) 0.1 MG tablet Take 0.1 mg by mouth 2 (two) times daily.   Yes Historical Provider, MD  esomeprazole (NEXIUM) 20 MG capsule Take 20 mg by mouth at bedtime.   Yes  Historical Provider, MD  ferrous sulfate 325 (65 FE) MG tablet Take 325 mg by mouth daily with breakfast.   Yes Historical Provider, MD  furosemide (LASIX) 20 MG tablet Take 20 mg by mouth 2 (two) times daily.   Yes Historical Provider, MD  insulin NPH Human (HUMULIN N,NOVOLIN N) 100 UNIT/ML injection Inject 0.23 mLs (23 Units total) into the skin 2 (two) times daily before a meal. 12/22/16  Yes Gladstone Lighter, MD  ipratropium-albuterol (DUONEB) 0.5-2.5 (3) MG/3ML SOLN Take 3 mLs by nebulization every 6 (six) hours as needed. 12/22/16  Yes Gladstone Lighter, MD  lisinopril (PRINIVIL,ZESTRIL) 10 MG tablet Take 10 mg by mouth daily.   Yes Historical Provider, MD  metFORMIN (GLUMETZA) 1000 MG (MOD) 24 hr tablet Take 1,000 mg by mouth 2 (two) times daily.   Yes Historical Provider, MD  potassium chloride (K-DUR) 10 MEQ tablet Take 10 mEq by mouth 2 (two) times daily.   Yes Historical Provider, MD  senna-docusate (SENOKOT-S) 8.6-50 MG tablet Take 2 tablets by mouth 2 (two) times daily. 12/22/16  Yes Gladstone Lighter, MD   Review of Systems  Constitutional: Positive for fatigue (minimal). Negative for appetite change.  HENT: Positive for rhinorrhea. Negative for congestion, postnasal drip and sore throat.   Eyes: Negative.   Respiratory: Negative for cough, chest tightness and shortness of breath.   Cardiovascular: Negative for chest pain, palpitations and leg swelling.  Gastrointestinal: Negative for abdominal distention.  Endocrine: Negative.   Genitourinary: Negative.   Musculoskeletal: Positive for back pain. Negative for neck pain.  Skin: Negative.   Allergic/Immunologic: Negative.   Neurological: Positive for light-headedness. Negative for dizziness.  Hematological: Negative for adenopathy. Bruises/bleeds easily.  Psychiatric/Behavioral: Negative for dysphoric mood, sleep disturbance (sleeping on 1 pillow with oxygen at 1L) and suicidal ideas. The patient is not nervous/anxious.    Vitals:    02/02/17 1049  BP: (!) 185/58  Pulse: 60  Resp: 20  SpO2: 96%  Weight: 176 lb (79.8 kg)  Height: 5\' 5"  (1.651 m)   Wt Readings from Last 3 Encounters:  02/02/17 176 lb (79.8 kg)  01/24/17 184 lb (83.5 kg)  01/19/17 183 lb 3.2 oz (83.1 kg)   Lab Results  Component Value Date   CREATININE 1.20 (H) 12/27/2016   CREATININE 1.30 (H) 12/21/2016   CREATININE 1.28 (H) 12/20/2016   Physical Exam  Constitutional: She is oriented to person, place, and time. She appears well-developed and well-nourished.  HENT:  Head: Normocephalic and atraumatic.  Neck: Normal range of motion. Neck supple. No JVD present.  Cardiovascular: An irregular rhythm present. Bradycardia present.   Pulmonary/Chest: Effort normal. She has no wheezes. She has no rales.  Abdominal: Soft. She exhibits no distension. There is no tenderness.  Musculoskeletal: She exhibits no edema or tenderness.  Neurological: She is alert and oriented to person, place, and time.  Skin: Skin is warm and dry. Ecchymosis (bilateral forearms) noted.  Psychiatric: She has a normal mood and affect. Her behavior  is normal.  Nursing note and vitals reviewed.  Assessment & Plan:  1: Chronic heart failure with preserved ejection fraction- - NYHA class II - euvolemic - is weighing daily and has noticed a gradual weight loss as she has noticed a decreased appetite. Reminded to call for an overnight weight gain of >2 pounds or a weekly weight gain of >5 pounds - using Mrs. Dash and she was encouraged to continue following a 2000mg  sodium diet - receiving physical therapy twice a week at her sister's home - saw cardiologist (F[place]) 12/05/16 - has had 2 sleep studies done but says that she can't afford the CPAP equipment  2: HTN- - BP elevated today but she says that she hasn't taken any of her medications yet today. Will do so as soon as she gets home.  - follows with PCP Rebeca Alert) - BMP reviewed from 12/27/16 and it shows a potassium of  3.9 and GFR 42  3: Atrial fibrillation- - currently rate controlled - taking carvedilol - currently not taking anticoagulants; she will follow up with cardiologist about this  Medication list from facility reviewed.  Return here in 1 month or sooner for any questions/problems before then.

## 2017-02-19 ENCOUNTER — Inpatient Hospital Stay: Payer: Medicare Other

## 2017-02-23 ENCOUNTER — Inpatient Hospital Stay: Payer: Medicare Other | Attending: Oncology

## 2017-02-23 DIAGNOSIS — E538 Deficiency of other specified B group vitamins: Secondary | ICD-10-CM | POA: Insufficient documentation

## 2017-02-23 DIAGNOSIS — D509 Iron deficiency anemia, unspecified: Secondary | ICD-10-CM | POA: Insufficient documentation

## 2017-02-23 DIAGNOSIS — Z79899 Other long term (current) drug therapy: Secondary | ICD-10-CM | POA: Diagnosis not present

## 2017-02-23 MED ORDER — CYANOCOBALAMIN 1000 MCG/ML IJ SOLN
1000.0000 ug | Freq: Once | INTRAMUSCULAR | Status: AC
  Administered 2017-02-23: 1000 ug via INTRAMUSCULAR
  Filled 2017-02-23: qty 1

## 2017-03-19 ENCOUNTER — Inpatient Hospital Stay: Payer: Medicare Other

## 2017-03-19 ENCOUNTER — Other Ambulatory Visit: Payer: Medicare Other

## 2017-03-23 ENCOUNTER — Inpatient Hospital Stay: Payer: Medicare Other

## 2017-03-29 ENCOUNTER — Other Ambulatory Visit: Payer: Medicare Other

## 2017-03-29 ENCOUNTER — Ambulatory Visit: Payer: Medicare Other

## 2017-04-03 ENCOUNTER — Inpatient Hospital Stay: Payer: Medicare Other | Attending: Oncology

## 2017-04-03 ENCOUNTER — Inpatient Hospital Stay: Payer: Medicare Other

## 2017-04-03 DIAGNOSIS — Z79899 Other long term (current) drug therapy: Secondary | ICD-10-CM | POA: Insufficient documentation

## 2017-04-03 DIAGNOSIS — E538 Deficiency of other specified B group vitamins: Secondary | ICD-10-CM | POA: Diagnosis not present

## 2017-04-03 DIAGNOSIS — D509 Iron deficiency anemia, unspecified: Secondary | ICD-10-CM

## 2017-04-03 LAB — CBC
HEMATOCRIT: 38.3 % (ref 35.0–47.0)
Hemoglobin: 12.8 g/dL (ref 12.0–16.0)
MCH: 28.7 pg (ref 26.0–34.0)
MCHC: 33.4 g/dL (ref 32.0–36.0)
MCV: 85.9 fL (ref 80.0–100.0)
PLATELETS: 246 10*3/uL (ref 150–440)
RBC: 4.46 MIL/uL (ref 3.80–5.20)
RDW: 13.8 % (ref 11.5–14.5)
WBC: 7.1 10*3/uL (ref 3.6–11.0)

## 2017-04-03 LAB — IRON AND TIBC
IRON: 78 ug/dL (ref 28–170)
Saturation Ratios: 38 % — ABNORMAL HIGH (ref 10.4–31.8)
TIBC: 205 ug/dL — AB (ref 250–450)
UIBC: 127 ug/dL

## 2017-04-03 LAB — FERRITIN: Ferritin: 310 ng/mL — ABNORMAL HIGH (ref 11–307)

## 2017-04-03 MED ORDER — CYANOCOBALAMIN 1000 MCG/ML IJ SOLN
1000.0000 ug | Freq: Once | INTRAMUSCULAR | Status: AC
  Administered 2017-04-03: 1000 ug via INTRAMUSCULAR
  Filled 2017-04-03: qty 1

## 2017-04-23 ENCOUNTER — Inpatient Hospital Stay: Payer: Medicare Other

## 2017-04-27 ENCOUNTER — Inpatient Hospital Stay: Payer: Medicare Other | Attending: Oncology

## 2017-04-27 DIAGNOSIS — D509 Iron deficiency anemia, unspecified: Secondary | ICD-10-CM | POA: Diagnosis not present

## 2017-04-27 DIAGNOSIS — E538 Deficiency of other specified B group vitamins: Secondary | ICD-10-CM | POA: Diagnosis not present

## 2017-04-27 DIAGNOSIS — Z79899 Other long term (current) drug therapy: Secondary | ICD-10-CM | POA: Diagnosis not present

## 2017-04-27 MED ORDER — CYANOCOBALAMIN 1000 MCG/ML IJ SOLN
1000.0000 ug | Freq: Once | INTRAMUSCULAR | Status: AC
  Administered 2017-04-27: 1000 ug via INTRAMUSCULAR
  Filled 2017-04-27: qty 1

## 2017-05-04 ENCOUNTER — Ambulatory Visit: Payer: Medicare Other | Attending: Family | Admitting: Family

## 2017-05-04 ENCOUNTER — Encounter: Payer: Self-pay | Admitting: Family

## 2017-05-04 VITALS — BP 170/73 | HR 73 | Resp 18 | Ht 65.0 in | Wt 161.1 lb

## 2017-05-04 DIAGNOSIS — Z7984 Long term (current) use of oral hypoglycemic drugs: Secondary | ICD-10-CM | POA: Diagnosis not present

## 2017-05-04 DIAGNOSIS — D649 Anemia, unspecified: Secondary | ICD-10-CM | POA: Diagnosis not present

## 2017-05-04 DIAGNOSIS — J449 Chronic obstructive pulmonary disease, unspecified: Secondary | ICD-10-CM | POA: Insufficient documentation

## 2017-05-04 DIAGNOSIS — R634 Abnormal weight loss: Secondary | ICD-10-CM | POA: Insufficient documentation

## 2017-05-04 DIAGNOSIS — I110 Hypertensive heart disease with heart failure: Secondary | ICD-10-CM | POA: Insufficient documentation

## 2017-05-04 DIAGNOSIS — I5032 Chronic diastolic (congestive) heart failure: Secondary | ICD-10-CM

## 2017-05-04 DIAGNOSIS — K219 Gastro-esophageal reflux disease without esophagitis: Secondary | ICD-10-CM | POA: Insufficient documentation

## 2017-05-04 DIAGNOSIS — Z9049 Acquired absence of other specified parts of digestive tract: Secondary | ICD-10-CM | POA: Insufficient documentation

## 2017-05-04 DIAGNOSIS — N183 Chronic kidney disease, stage 3 unspecified: Secondary | ICD-10-CM

## 2017-05-04 DIAGNOSIS — E1122 Type 2 diabetes mellitus with diabetic chronic kidney disease: Secondary | ICD-10-CM

## 2017-05-04 DIAGNOSIS — Z794 Long term (current) use of insulin: Secondary | ICD-10-CM

## 2017-05-04 DIAGNOSIS — I480 Paroxysmal atrial fibrillation: Secondary | ICD-10-CM

## 2017-05-04 DIAGNOSIS — G4733 Obstructive sleep apnea (adult) (pediatric): Secondary | ICD-10-CM | POA: Insufficient documentation

## 2017-05-04 DIAGNOSIS — E785 Hyperlipidemia, unspecified: Secondary | ICD-10-CM | POA: Diagnosis not present

## 2017-05-04 DIAGNOSIS — I10 Essential (primary) hypertension: Secondary | ICD-10-CM

## 2017-05-04 DIAGNOSIS — R197 Diarrhea, unspecified: Secondary | ICD-10-CM | POA: Insufficient documentation

## 2017-05-04 DIAGNOSIS — I4891 Unspecified atrial fibrillation: Secondary | ICD-10-CM | POA: Diagnosis not present

## 2017-05-04 DIAGNOSIS — E119 Type 2 diabetes mellitus without complications: Secondary | ICD-10-CM | POA: Diagnosis not present

## 2017-05-04 LAB — GLUCOSE, CAPILLARY: GLUCOSE-CAPILLARY: 122 mg/dL — AB (ref 65–99)

## 2017-05-04 NOTE — Progress Notes (Signed)
Patient ID: Dorothy [name], female    DOB: Sep 24, 1934, 82 y.o.   MRN: [id number]  HPI  Dorothy [name] is a 82 y/o female with a history of anemia, atrial fibrillation, COPD, DM, GERD, hyperlipidemia, HTN, obstructive sleep apnea (unable to wear CPAP due to cost) and chronic heart failure.   Reviewed last echo report done on 12/19/16 and it showed an EF of 55-60% along with moderate MR and mod/severe TR.   Admitted 12/18/16 with acute on chronic anemia along with angioedema related to tramadol. GI & hematology consults obtained. Received IV iron for 3 days along with 2 units of PRBC's. Angioedema resolved on its own. Discharged home after 4 days. Was in the ED 12/17/16 after a mechanical fall. Xrays were obtained and she was discharged home. Was in the ED 09/03/16 with leg edema. BNP unremarkable. Patient discharged home with instructions to take her furosemide as prescribed.   She presents today for her follow-up visit with a chief complaint of mild fatigue upon moderate exertion. She describes this as chronic in nature having been present for several years with varying levels of severity. She has associated diarrhea and easy bruising along with this. She denies any shortness of breath, chest pain, edema or weight gain.   Past Medical History:  Diagnosis Date  . Anemia   . Arthritis    hands, knees, back  . Atrial fibrillation ([place])   . CHF (congestive heart failure) ([place])   . COPD (chronic obstructive pulmonary disease) ([place])   . Diabetes mellitus without complication ([place])   . Dyspnea   . GERD (gastroesophageal reflux disease)   . Hyperlipidemia   . Hypertension   . PONV (postoperative nausea and vomiting)   . Sleep apnea    told she needs CPAP, does not have  . Wears dentures    full upper and lower.  only wears upper   Past Surgical History:  Procedure Laterality Date  . ABDOMINAL HYSTERECTOMY    . CATARACT EXTRACTION W/PHACO Right 07/12/2016   Procedure: CATARACT EXTRACTION PHACO AND  INTRAOCULAR LENS PLACEMENT (IOC);  Surgeon: Leandrew Koyanagi, MD;  Location: [place];  Service: Ophthalmology;  Laterality: Right;  DIABETIC - insulin and oral meds sleep apnea  . CHOLECYSTECTOMY    . COLONOSCOPY N/A 01/19/2015   Procedure: COLONOSCOPY;  Surgeon: Josefine Class, MD;  Location: [facility] ENDOSCOPY;  Service: Endoscopy;  Laterality: N/A;  . ESOPHAGOGASTRODUODENOSCOPY N/A 01/19/2015   Procedure: ESOPHAGOGASTRODUODENOSCOPY (EGD);  Surgeon: Josefine Class, MD;  Location: [facility] ENDOSCOPY;  Service: Endoscopy;  Laterality: N/A;  . SHOULDER ARTHROSCOPY     No family history on file. Social History  Substance Use Topics  . Smoking status: Never Smoker  . Smokeless tobacco: Never Used  . Alcohol use No   Allergies  Allergen Reactions  . Tramadol Anaphylaxis   Prior to Admission medications   Medication Sig Start Date End Date Taking? Authorizing Provider  acetaminophen (TYLENOL) 500 MG tablet Take 1,000 mg by mouth every 6 (six) hours as needed for moderate pain or headache.   Yes [provider]  albuterol (PROVENTIL HFA;VENTOLIN HFA) 108 (90 BASE) MCG/ACT inhaler Inhale 2 puffs into the lungs every 6 (six) hours as needed for wheezing or shortness of breath.   Yes [provider]  atorvastatin (LIPITOR) 80 MG tablet Take 80 mg by mouth daily.   Yes [provider]  Calcium Carb-Cholecalciferol (CALCIUM 600 + D PO) Take 1 tablet by mouth.   Yes [provider]  carvedilol (COREG) 6.25 MG tablet Take 1 tablet (6.25 mg total) by mouth 2 (two) times daily with a meal. 12/22/16  Yes Gladstone Lighter, MD  cloNIDine (CATAPRES) 0.1 MG tablet Take 0.1 mg by mouth 2 (two) times daily.   Yes [provider]  esomeprazole (NEXIUM) 20 MG capsule Take 20 mg by mouth at bedtime.   Yes [provider]  ferrous sulfate 325 (65 FE) MG tablet Take 325 mg by mouth daily with breakfast.   Yes [provider]   furosemide (LASIX) 20 MG tablet Take 20 mg by mouth 2 (two) times daily.   Yes [provider]  insulin NPH Human (HUMULIN N,NOVOLIN N) 100 UNIT/ML injection Inject 0.23 mLs (23 Units total) into the skin 2 (two) times daily before a meal. Patient taking differently: Inject 25 Units into the skin 2 (two) times daily before a meal. Take 15 units at night 12/22/16  Yes Gladstone Lighter, MD  ipratropium-albuterol (DUONEB) 0.5-2.5 (3) MG/3ML SOLN Take 3 mLs by nebulization every 6 (six) hours as needed. 12/22/16  Yes Gladstone Lighter, MD  lisinopril (PRINIVIL,ZESTRIL) 10 MG tablet Take 10 mg by mouth daily.   Yes [provider]  metFORMIN (GLUMETZA) 1000 MG (MOD) 24 hr tablet Take 1,000 mg by mouth 2 (two) times daily.   Yes [provider]  potassium chloride (K-DUR) 10 MEQ tablet Take 10 mEq by mouth 2 (two) times daily.   Yes [provider]  senna-docusate (SENOKOT-S) 8.6-50 MG tablet Take 2 tablets by mouth 2 (two) times daily. 12/22/16  Yes Gladstone Lighter, MD    Review of Systems  Constitutional: Positive for fatigue (minimal). Negative for appetite change.  HENT: Positive for rhinorrhea. Negative for congestion, postnasal drip and sore throat.   Eyes: Negative.   Respiratory: Negative for cough, chest tightness and shortness of breath.   Cardiovascular: Positive for leg swelling (minimal edema at the end of the day). Negative for chest pain and palpitations.  Gastrointestinal: Positive for diarrhea (for the last month). Negative for abdominal distention.  Endocrine: Negative.   Genitourinary: Negative.   Musculoskeletal: Positive for back pain. Negative for neck pain.  Skin: Negative.   Allergic/Immunologic: Negative.   Neurological: Negative for dizziness and light-headedness.  Hematological: Negative for adenopathy. Bruises/bleeds easily.  Psychiatric/Behavioral: Negative for dysphoric mood, sleep disturbance (sleeping on 1 pillow ) and  suicidal ideas. The patient is not nervous/anxious.    Vitals:   05/04/17 1008  BP: (!) 170/73  Pulse: 73  Resp: 18  SpO2: 96%  Weight: 161 lb 2 oz (73.1 kg)  Height: 5\' 5"  (1.651 m)   Wt Readings from Last 3 Encounters:  05/04/17 161 lb 2 oz (73.1 kg)  02/02/17 176 lb (79.8 kg)  01/24/17 184 lb (83.5 kg)    Lab Results  Component Value Date   CREATININE 1.20 (H) 12/27/2016   CREATININE 1.30 (H) 12/21/2016   CREATININE 1.28 (H) 12/20/2016   Physical Exam  Constitutional: She is oriented to person, place, and time. She appears well-developed and well-nourished.  HENT:  Head: Normocephalic and atraumatic.  Neck: Normal range of motion. Neck supple. No JVD present.  Cardiovascular: Normal rate.  An irregular rhythm present.  Pulmonary/Chest: Effort normal. She has no wheezes. She has no rales.  Abdominal: Soft. She exhibits no distension. There is no tenderness.  Musculoskeletal: She exhibits no edema or tenderness.  Neurological: She is alert and oriented to person, place, and time.  Skin: Skin is warm and  dry. No ecchymosis noted.  Psychiatric: She has a normal mood and affect. Her behavior is normal.  Nursing note and vitals reviewed.  Assessment & Plan:  1: Chronic heart failure with preserved ejection fraction- - NYHA class II - euvolemic - is weighing daily and has noticed a continued gradual weight loss as she has noticed a decreased appetite. Reminded to call for an overnight weight gain of >2 pounds or a weekly weight gain of >5 pounds - weight down 14.8 pounds since she was last here and 28.5 pounds down since 12/26/16 - has had diarrhea for the last month and being followed by GI - using Mrs. Dash and she was encouraged to continue following a 2000mg  sodium diet - saw cardiologist ([place]) 12/05/16 - has had 2 sleep studies done but says that she can't afford the CPAP equipment  2: HTN- - BP elevated today but she says that she hasn't taken any of her  medications yet today. Will do so as soon as she gets home.  - encouraged her to take her medications prior to coming to the clinic so that her BP can be assessed - follows with PCP Rebeca Alert) and says that she saw her last week - BMP reviewed from 12/27/16 and it shows a potassium of 3.9 and GFR 42  3: Atrial fibrillation- - currently rate controlled - taking carvedilol - currently not taking anticoagulants as she's waiting on her colonoscopy to get scheduled  4: Diabetes- - glucose in clinic was 122 - follows with PCP regarding this  Patient did not bring her medications nor a list. Each medication was verbally reviewed with the patient and she was encouraged to bring the bottles to every visit to confirm accuracy of list.  Return here in 6 months or sooner for any questions/problems before then.

## 2017-05-04 NOTE — Patient Instructions (Signed)
Continue weighing daily and call for an overnight weight gain of > 2 pounds or a weekly weight gain of >5 pounds. 

## 2017-05-21 ENCOUNTER — Inpatient Hospital Stay: Payer: Medicare Other

## 2017-05-22 ENCOUNTER — Inpatient Hospital Stay: Payer: Medicare Other

## 2017-05-22 ENCOUNTER — Other Ambulatory Visit: Payer: Medicare Other

## 2017-05-22 ENCOUNTER — Ambulatory Visit: Payer: Medicare Other | Admitting: Oncology

## 2017-05-23 ENCOUNTER — Other Ambulatory Visit
Admission: RE | Admit: 2017-05-23 | Discharge: 2017-05-23 | Disposition: A | Discharge status: Home / Self Care | Payer: Medicare Other | Source: Ambulatory Visit | Attending: Gastroenterology | Admitting: Gastroenterology

## 2017-05-23 DIAGNOSIS — R197 Diarrhea, unspecified: Secondary | ICD-10-CM | POA: Insufficient documentation

## 2017-05-23 LAB — GASTROINTESTINAL PANEL BY PCR, STOOL (REPLACES STOOL CULTURE)
ADENOVIRUS F40/41: NOT DETECTED
Astrovirus: NOT DETECTED
CAMPYLOBACTER SPECIES: NOT DETECTED
CRYPTOSPORIDIUM: NOT DETECTED
CYCLOSPORA CAYETANENSIS: NOT DETECTED
ENTAMOEBA HISTOLYTICA: NOT DETECTED
ENTEROPATHOGENIC E COLI (EPEC): NOT DETECTED
Enteroaggregative E coli (EAEC): NOT DETECTED
Enterotoxigenic E coli (ETEC): NOT DETECTED
Giardia lamblia: NOT DETECTED
Norovirus GI/GII: NOT DETECTED
PLESIMONAS SHIGELLOIDES: NOT DETECTED
Rotavirus A: NOT DETECTED
Salmonella species: NOT DETECTED
Sapovirus (I, II, IV, and V): NOT DETECTED
Shiga like toxin producing E coli (STEC): NOT DETECTED
Shigella/Enteroinvasive E coli (EIEC): NOT DETECTED
VIBRIO CHOLERAE: NOT DETECTED
VIBRIO SPECIES: NOT DETECTED
YERSINIA ENTEROCOLITICA: NOT DETECTED

## 2017-05-23 LAB — C DIFFICILE QUICK SCREEN W PCR REFLEX
C Diff antigen: NEGATIVE
C Diff interpretation: NOT DETECTED
C Diff toxin: NEGATIVE

## 2017-05-25 ENCOUNTER — Other Ambulatory Visit: Payer: Medicare Other

## 2017-05-25 ENCOUNTER — Inpatient Hospital Stay: Payer: Medicare Other

## 2017-05-25 ENCOUNTER — Ambulatory Visit: Payer: Medicare Other | Admitting: Oncology

## 2017-05-29 ENCOUNTER — Inpatient Hospital Stay: Payer: Medicare Other | Attending: Oncology

## 2017-05-29 ENCOUNTER — Inpatient Hospital Stay: Payer: Medicare Other

## 2017-05-29 ENCOUNTER — Inpatient Hospital Stay (HOSPITAL_BASED_OUTPATIENT_CLINIC_OR_DEPARTMENT_OTHER): Payer: Medicare Other | Admitting: Oncology

## 2017-05-29 VITALS — BP 179/95 | HR 80 | Temp 97.4°F | Resp 18 | Wt 160.2 lb

## 2017-05-29 DIAGNOSIS — Z7984 Long term (current) use of oral hypoglycemic drugs: Secondary | ICD-10-CM | POA: Diagnosis not present

## 2017-05-29 DIAGNOSIS — K219 Gastro-esophageal reflux disease without esophagitis: Secondary | ICD-10-CM | POA: Insufficient documentation

## 2017-05-29 DIAGNOSIS — E538 Deficiency of other specified B group vitamins: Secondary | ICD-10-CM | POA: Diagnosis not present

## 2017-05-29 DIAGNOSIS — D509 Iron deficiency anemia, unspecified: Secondary | ICD-10-CM | POA: Diagnosis present

## 2017-05-29 DIAGNOSIS — E119 Type 2 diabetes mellitus without complications: Secondary | ICD-10-CM | POA: Diagnosis not present

## 2017-05-29 DIAGNOSIS — Z79899 Other long term (current) drug therapy: Secondary | ICD-10-CM

## 2017-05-29 DIAGNOSIS — I110 Hypertensive heart disease with heart failure: Secondary | ICD-10-CM | POA: Diagnosis not present

## 2017-05-29 DIAGNOSIS — I4891 Unspecified atrial fibrillation: Secondary | ICD-10-CM | POA: Diagnosis not present

## 2017-05-29 DIAGNOSIS — E785 Hyperlipidemia, unspecified: Secondary | ICD-10-CM | POA: Diagnosis not present

## 2017-05-29 DIAGNOSIS — J449 Chronic obstructive pulmonary disease, unspecified: Secondary | ICD-10-CM | POA: Diagnosis not present

## 2017-05-29 DIAGNOSIS — I509 Heart failure, unspecified: Secondary | ICD-10-CM | POA: Diagnosis not present

## 2017-05-29 LAB — CBC
HCT: 35.9 % (ref 35.0–47.0)
Hemoglobin: 12.1 g/dL (ref 12.0–16.0)
MCH: 29.9 pg (ref 26.0–34.0)
MCHC: 33.5 g/dL (ref 32.0–36.0)
MCV: 89.3 fL (ref 80.0–100.0)
PLATELETS: 193 10*3/uL (ref 150–440)
RBC: 4.02 MIL/uL (ref 3.80–5.20)
RDW: 14.5 % (ref 11.5–14.5)
WBC: 5.4 10*3/uL (ref 3.6–11.0)

## 2017-05-29 LAB — IRON AND TIBC
Iron: 63 ug/dL (ref 28–170)
Saturation Ratios: 26 % (ref 10.4–31.8)
TIBC: 239 ug/dL — AB (ref 250–450)
UIBC: 176 ug/dL

## 2017-05-29 LAB — FERRITIN: Ferritin: 178 ng/mL (ref 11–307)

## 2017-05-29 NOTE — Progress Notes (Signed)
Hematology/Oncology Consult note [facility]  [phone] [phone]  Patient Care Team: Letta Median, MD as PCP - General Jackelyn Hoehn, Aura Fey, FNP as Nurse Practitioner (Family Medicine) Isaias Cowman, MD as Consulting Physician (Cardiology)   Name of the patient: Dorothy [name]  [id number]  October 30, 1934   Date of visit: 05/29/17  Diagnosis- iron deficiency and b12 deficiency anemia  Chief complaint/ Reason for visit- routine f/u of B12 deficiency anemia  Heme/Onc history: patient is a 82 year old female who was seen by Dr. Nani Skillern 2016) deficiency anemia. At that time she had an EGD and a colonoscopy which did not reveal any evidence of bleeding. She didn't get better with oral iron and was eventually asked to follow-up with her primary care doctor. Patient was admitted to the hospital for severe allergy angioedema and was found to have a pneumonia and heart failure. Her hemoglobin was 7.3 with an MCV of 68. She has received 2 doses of benefit for 200 mg along with one unit of blood transfusion. She has been seen by GI Dr. Vicente Males who plans to scope her as an outpatient after her acute medical issues resolved.   Iron studies from 12/19/2016 showed a low serum iron of 13 and low iron saturation of 3% and normal TIBC of 426. Ferritin was low at 9 and B12 was normal at 151. Patient has received venofer and B12 injections in the recent past  Interval history- reports dong well. Denies any blood loss in stool or urine  ECOG PS- 2 Pain scale- 0   Review of systems- Review of Systems  Constitutional: Negative for chills, fever, malaise/fatigue and weight loss.  HENT: Negative for congestion, ear discharge and nosebleeds.   Eyes: Negative for blurred vision.  Respiratory: Negative for cough, hemoptysis, sputum production, shortness of breath and wheezing.   Cardiovascular: Negative for chest pain, palpitations, orthopnea and  claudication.  Gastrointestinal: Negative for abdominal pain, blood in stool, constipation, diarrhea, heartburn, melena, nausea and vomiting.  Genitourinary: Negative for dysuria, flank pain, frequency, hematuria and urgency.  Musculoskeletal: Negative for back pain, joint pain and myalgias.  Skin: Negative for rash.  Neurological: Negative for dizziness, tingling, focal weakness, seizures, weakness and headaches.  Endo/Heme/Allergies: Does not bruise/bleed easily.  Psychiatric/Behavioral: Negative for depression and suicidal ideas. The patient does not have insomnia.      Allergies  Allergen Reactions  . Tramadol Anaphylaxis     Past Medical History:  Diagnosis Date  . Anemia   . Arthritis    hands, knees, back  . Atrial fibrillation ([place])   . CHF (congestive heart failure) ([place])   . COPD (chronic obstructive pulmonary disease) ([place])   . Diabetes mellitus without complication ([place])   . Dyspnea   . GERD (gastroesophageal reflux disease)   . Hyperlipidemia   . Hypertension   . PONV (postoperative nausea and vomiting)   . Sleep apnea    told she needs CPAP, does not have  . Wears dentures    full upper and lower.  only wears upper     Past Surgical History:  Procedure Laterality Date  . ABDOMINAL HYSTERECTOMY    . CATARACT EXTRACTION W/PHACO Right 07/12/2016   Procedure: CATARACT EXTRACTION PHACO AND INTRAOCULAR LENS PLACEMENT (IOC);  Surgeon: Leandrew Koyanagi, MD;  Location: [place];  Service: Ophthalmology;  Laterality: Right;  DIABETIC - insulin and oral meds sleep apnea  . CHOLECYSTECTOMY    . COLONOSCOPY N/A 01/19/2015   Procedure: COLONOSCOPY;  Surgeon: Josefine Class, MD;  Location: [facility] ENDOSCOPY;  Service: Endoscopy;  Laterality: N/A;  . ESOPHAGOGASTRODUODENOSCOPY N/A 01/19/2015   Procedure: ESOPHAGOGASTRODUODENOSCOPY (EGD);  Surgeon: Josefine Class, MD;  Location: [facility] ENDOSCOPY;  Service: Endoscopy;  Laterality: N/A;  . SHOULDER  ARTHROSCOPY      Social History   Social History  . Marital status: Widowed    Spouse name: N/A  . Number of children: N/A  . Years of education: N/A   Occupational History  . Not on file.   Social History Main Topics  . Smoking status: Never Smoker  . Smokeless tobacco: Never Used  . Alcohol use No  . Drug use: Unknown  . Sexual activity: Not on file   Other Topics Concern  . Not on file   Social History Narrative  . No narrative on file    No family history on file.   Current Outpatient Prescriptions:  .  atorvastatin (LIPITOR) 80 MG tablet, Take 80 mg by mouth daily., Disp: , Rfl:  .  Calcium Carb-Cholecalciferol (CALCIUM 600 + D PO), Take 1 tablet by mouth., Disp: , Rfl:  .  carvedilol (COREG) 6.25 MG tablet, Take 1 tablet (6.25 mg total) by mouth 2 (two) times daily with a meal., Disp: 60 tablet, Rfl: 2 .  cloNIDine (CATAPRES) 0.1 MG tablet, Take 0.1 mg by mouth 2 (two) times daily., Disp: , Rfl:  .  cyanocobalamin (,VITAMIN B-12,) 1000 MCG/ML injection, Inject into the muscle., Disp: , Rfl:  .  esomeprazole (NEXIUM) 20 MG capsule, Take 20 mg by mouth at bedtime., Disp: , Rfl:  .  ferrous sulfate 325 (65 FE) MG tablet, Take 325 mg by mouth daily with breakfast., Disp: , Rfl:  .  furosemide (LASIX) 20 MG tablet, Take 20 mg by mouth 2 (two) times daily., Disp: , Rfl:  .  insulin NPH Human (HUMULIN N,NOVOLIN N) 100 UNIT/ML injection, Inject 0.23 mLs (23 Units total) into the skin 2 (two) times daily before a meal. (Patient taking differently: Inject 25 Units into the skin 2 (two) times daily before a meal. Take 15 units at night), Disp: 10 mL, Rfl: 11 .  lisinopril (PRINIVIL,ZESTRIL) 10 MG tablet, Take 10 mg by mouth daily., Disp: , Rfl:  .  metFORMIN (GLUMETZA) 1000 MG (MOD) 24 hr tablet, Take 1,000 mg by mouth 2 (two) times daily., Disp: , Rfl:  .  potassium chloride (K-DUR) 10 MEQ tablet, Take 10 mEq by mouth 2 (two) times daily., Disp: , Rfl:  .  acetaminophen  (TYLENOL) 500 MG tablet, Take 1,000 mg by mouth every 6 (six) hours as needed for moderate pain or headache., Disp: , Rfl:  .  albuterol (PROVENTIL HFA;VENTOLIN HFA) 108 (90 BASE) MCG/ACT inhaler, Inhale 2 puffs into the lungs every 6 (six) hours as needed for wheezing or shortness of breath., Disp: , Rfl:  .  colestipol (COLESTID) 1 g tablet, Take by mouth., Disp: , Rfl:  .  ipratropium-albuterol (DUONEB) 0.5-2.5 (3) MG/3ML SOLN, Take 3 mLs by nebulization every 6 (six) hours as needed. (Patient not taking: Reported on 05/29/2017), Disp: 360 mL, Rfl: 0 .  senna-docusate (SENOKOT-S) 8.6-50 MG tablet, Take 2 tablets by mouth 2 (two) times daily. (Patient not taking: Reported on 05/29/2017), Disp: 30 tablet, Rfl: 0  Physical exam:  Vitals:   05/29/17 1140  BP: (!) 179/95  Pulse: 80  Resp: 18  Temp: (!) 97.4 F (36.3 C)  TempSrc: Tympanic  Weight: 160 lb 3.2 oz (72.7 kg)  Physical Exam  Constitutional: She is oriented to person, place, and time.  Elderly frail woman who ambulates with a walker  HENT:  Head: Normocephalic and atraumatic.  Eyes: Pupils are equal, round, and reactive to light. EOM are normal.  Neck: Normal range of motion.  Cardiovascular: Normal rate and normal heart sounds.   irregular  Pulmonary/Chest: Effort normal and breath sounds normal.  Abdominal: Soft. Bowel sounds are normal.  Neurological: She is alert and oriented to person, place, and time.  Skin: Skin is warm and dry.     CMP Latest Ref Rng & Units 12/27/2016  Glucose 65 - 99 mg/dL 179(H)  BUN 6 - 20 mg/dL 17  Creatinine 0.44 - 1.00 mg/dL 1.20(H)  Sodium 135 - 145 mmol/L 140  Potassium 3.5 - 5.1 mmol/L 3.9  Chloride 101 - 111 mmol/L 98(L)  CO2 22 - 32 mmol/L 31  Calcium 8.9 - 10.3 mg/dL 9.2  Total Protein 6.5 - 8.1 g/dL 6.6  Total Bilirubin 0.3 - 1.2 mg/dL 0.8  Alkaline Phos 38 - 126 U/L 81  AST 15 - 41 U/L 29  ALT 14 - 54 U/L 23   CBC Latest Ref Rng & Units 05/29/2017  WBC 3.6 - 11.0 K/uL 5.4    Hemoglobin 12.0 - 16.0 g/dL 12.1  Hematocrit 35.0 - 47.0 % 35.9  Platelets 150 - 440 K/uL 193      Assessment and plan- Patient is a 82 y.o. female with iron and b12 deficiency anemia  1. Iron deficiency anemia- she is no longer anemic. Iron studies wnl.  2. b12 deficiency- she has been on monthly b12 so far. We will switch her to PO B12 1000 mcg PO daily  Repeat cbc, ferritin and iron studies and B12 in 3 and 6 months and I will her 6 months   Visit Diagnosis 1. Iron deficiency anemia, unspecified iron deficiency anemia type   2. B12 deficiency      Dr. Randa Evens, MD, MPH [facility] Pager- [phone] 05/29/2017 12:29 PM

## 2017-05-29 NOTE — Progress Notes (Signed)
Here for follow up feeling good she stated

## 2017-05-29 NOTE — Patient Instructions (Signed)
Patient instructed that she does not need a b12 injection and she will start on over the counter vitamin b12 1000 mcg once daily.

## 2017-06-18 ENCOUNTER — Inpatient Hospital Stay: Payer: Medicare Other

## 2017-06-22 ENCOUNTER — Inpatient Hospital Stay: Payer: Medicare Other | Attending: Oncology

## 2017-07-10 ENCOUNTER — Encounter: Payer: Self-pay | Admitting: *Deleted

## 2017-07-11 ENCOUNTER — Ambulatory Visit: Payer: Medicare Other | Admitting: Anesthesiology

## 2017-07-11 ENCOUNTER — Encounter
Admission: RE | Disposition: A | Discharge status: Home / Self Care | Payer: Self-pay | Source: Ambulatory Visit | Attending: Internal Medicine

## 2017-07-11 ENCOUNTER — Encounter: Payer: Self-pay | Admitting: *Deleted

## 2017-07-11 ENCOUNTER — Ambulatory Visit
Admission: RE | Admit: 2017-07-11 | Discharge: 2017-07-11 | Disposition: A | Discharge status: Home / Self Care | Payer: Medicare Other | Source: Ambulatory Visit | Attending: Internal Medicine | Admitting: Internal Medicine

## 2017-07-11 DIAGNOSIS — I110 Hypertensive heart disease with heart failure: Secondary | ICD-10-CM | POA: Diagnosis not present

## 2017-07-11 DIAGNOSIS — E785 Hyperlipidemia, unspecified: Secondary | ICD-10-CM | POA: Diagnosis not present

## 2017-07-11 DIAGNOSIS — G4730 Sleep apnea, unspecified: Secondary | ICD-10-CM | POA: Insufficient documentation

## 2017-07-11 DIAGNOSIS — D649 Anemia, unspecified: Secondary | ICD-10-CM | POA: Insufficient documentation

## 2017-07-11 DIAGNOSIS — M1990 Unspecified osteoarthritis, unspecified site: Secondary | ICD-10-CM | POA: Diagnosis not present

## 2017-07-11 DIAGNOSIS — K641 Second degree hemorrhoids: Secondary | ICD-10-CM | POA: Diagnosis not present

## 2017-07-11 DIAGNOSIS — E109 Type 1 diabetes mellitus without complications: Secondary | ICD-10-CM | POA: Insufficient documentation

## 2017-07-11 DIAGNOSIS — I4891 Unspecified atrial fibrillation: Secondary | ICD-10-CM | POA: Diagnosis not present

## 2017-07-11 DIAGNOSIS — Z79899 Other long term (current) drug therapy: Secondary | ICD-10-CM | POA: Diagnosis not present

## 2017-07-11 DIAGNOSIS — D509 Iron deficiency anemia, unspecified: Secondary | ICD-10-CM | POA: Diagnosis present

## 2017-07-11 DIAGNOSIS — Z980 Intestinal bypass and anastomosis status: Secondary | ICD-10-CM | POA: Insufficient documentation

## 2017-07-11 DIAGNOSIS — I509 Heart failure, unspecified: Secondary | ICD-10-CM | POA: Diagnosis not present

## 2017-07-11 DIAGNOSIS — J449 Chronic obstructive pulmonary disease, unspecified: Secondary | ICD-10-CM | POA: Insufficient documentation

## 2017-07-11 DIAGNOSIS — Z794 Long term (current) use of insulin: Secondary | ICD-10-CM | POA: Diagnosis not present

## 2017-07-11 DIAGNOSIS — K5730 Diverticulosis of large intestine without perforation or abscess without bleeding: Secondary | ICD-10-CM | POA: Diagnosis not present

## 2017-07-11 DIAGNOSIS — K219 Gastro-esophageal reflux disease without esophagitis: Secondary | ICD-10-CM | POA: Insufficient documentation

## 2017-07-11 DIAGNOSIS — K449 Diaphragmatic hernia without obstruction or gangrene: Secondary | ICD-10-CM | POA: Diagnosis not present

## 2017-07-11 DIAGNOSIS — D122 Benign neoplasm of ascending colon: Secondary | ICD-10-CM | POA: Insufficient documentation

## 2017-07-11 DIAGNOSIS — K644 Residual hemorrhoidal skin tags: Secondary | ICD-10-CM | POA: Insufficient documentation

## 2017-07-11 HISTORY — PX: ESOPHAGOGASTRODUODENOSCOPY (EGD) WITH PROPOFOL: SHX5813

## 2017-07-11 HISTORY — PX: COLONOSCOPY WITH PROPOFOL: SHX5780

## 2017-07-11 LAB — GLUCOSE, CAPILLARY: Glucose-Capillary: 153 mg/dL — ABNORMAL HIGH (ref 65–99)

## 2017-07-11 SURGERY — ESOPHAGOGASTRODUODENOSCOPY (EGD) WITH PROPOFOL
Anesthesia: General

## 2017-07-11 MED ORDER — SODIUM CHLORIDE 0.9 % IV SOLN
INTRAVENOUS | Status: DC | PRN
  Administered 2017-07-11: 1000 mL
  Administered 2017-07-11: 07:00:00 via INTRAVENOUS

## 2017-07-11 MED ORDER — PROPOFOL 500 MG/50ML IV EMUL
INTRAVENOUS | Status: DC | PRN
  Administered 2017-07-11: 120 ug/kg/min via INTRAVENOUS

## 2017-07-11 MED ORDER — LIDOCAINE 2% (20 MG/ML) 5 ML SYRINGE
INTRAMUSCULAR | Status: DC | PRN
  Administered 2017-07-11: 30 mg via INTRAVENOUS

## 2017-07-11 MED ORDER — LIDOCAINE HCL (PF) 2 % IJ SOLN
INTRAMUSCULAR | Status: AC
  Filled 2017-07-11: qty 10

## 2017-07-11 MED ORDER — FENTANYL CITRATE (PF) 100 MCG/2ML IJ SOLN
INTRAMUSCULAR | Status: DC | PRN
  Administered 2017-07-11: 50 ug via INTRAVENOUS

## 2017-07-11 MED ORDER — PROPOFOL 500 MG/50ML IV EMUL
INTRAVENOUS | Status: AC
Start: 2017-07-11 — End: 2017-07-11
  Filled 2017-07-11: qty 50

## 2017-07-11 MED ORDER — EPHEDRINE SULFATE 50 MG/ML IJ SOLN
INTRAMUSCULAR | Status: AC
  Filled 2017-07-11: qty 1

## 2017-07-11 MED ORDER — SODIUM CHLORIDE 0.9 % IJ SOLN
INTRAMUSCULAR | Status: AC
  Filled 2017-07-11: qty 10

## 2017-07-11 MED ORDER — PHENYLEPHRINE HCL 10 MG/ML IJ SOLN
INTRAMUSCULAR | Status: AC
  Filled 2017-07-11: qty 1

## 2017-07-11 MED ORDER — PROPOFOL 10 MG/ML IV BOLUS
INTRAVENOUS | Status: DC | PRN
  Administered 2017-07-11: 80 mg via INTRAVENOUS

## 2017-07-11 MED ORDER — FENTANYL CITRATE (PF) 100 MCG/2ML IJ SOLN
INTRAMUSCULAR | Status: AC
  Filled 2017-07-11: qty 2

## 2017-07-11 MED ORDER — PROPOFOL 10 MG/ML IV BOLUS
INTRAVENOUS | Status: AC
Start: 2017-07-11 — End: 2017-07-11
  Filled 2017-07-11: qty 20

## 2017-07-11 MED ORDER — PHENYLEPHRINE HCL 10 MG/ML IJ SOLN
INTRAMUSCULAR | Status: DC | PRN
  Administered 2017-07-11 (×2): 100 ug via INTRAVENOUS

## 2017-07-11 NOTE — Anesthesia Post-op Follow-up Note (Signed)
Anesthesia QCDR form completed.        

## 2017-07-11 NOTE — H&P (Signed)
Outpatient short stay form Pre-procedure 07/11/2017 9:22 AM Dorothy [name] K. Alice Reichert, M.D.  Primary Physician: Miquel Dunn, MD, Phd (Cardiology referring)  Reason for visit:  Unexplained iron deficiency anemia.  History of present illness:  Patient with unexplained iron deficiency anemia. Has remote hx of PUD s/p Billroth I anastomosis post-gastrectomy.    No current facility-administered medications for this encounter.   Current Outpatient Prescriptions:  .  acetaminophen (TYLENOL) 500 MG tablet, Take 1,000 mg by mouth every 6 (six) hours as needed for moderate pain or headache., Disp: , Rfl:  .  atorvastatin (LIPITOR) 80 MG tablet, Take 80 mg by mouth daily., Disp: , Rfl:  .  Calcium Carb-Cholecalciferol (CALCIUM 600 + D PO), Take 1 tablet by mouth., Disp: , Rfl:  .  cloNIDine (CATAPRES) 0.1 MG tablet, Take 0.1 mg by mouth 2 (two) times daily., Disp: , Rfl:  .  cyanocobalamin (,VITAMIN B-12,) 1000 MCG/ML injection, Inject into the muscle., Disp: , Rfl:  .  esomeprazole (NEXIUM) 20 MG capsule, Take 20 mg by mouth at bedtime., Disp: , Rfl:  .  ferrous sulfate 325 (65 FE) MG tablet, Take 325 mg by mouth daily with breakfast., Disp: , Rfl:  .  furosemide (LASIX) 20 MG tablet, Take 20 mg by mouth 2 (two) times daily., Disp: , Rfl:  .  insulin NPH Human (HUMULIN N,NOVOLIN N) 100 UNIT/ML injection, Inject 0.23 mLs (23 Units total) into the skin 2 (two) times daily before a meal. (Patient taking differently: Inject 25 Units into the skin 2 (two) times daily before a meal. Take 15 units at night), Disp: 10 mL, Rfl: 11 .  lisinopril (PRINIVIL,ZESTRIL) 10 MG tablet, Take 10 mg by mouth daily., Disp: , Rfl:  .  metFORMIN (GLUMETZA) 1000 MG (MOD) 24 hr tablet, Take 1,000 mg by mouth 2 (two) times daily., Disp: , Rfl:  .  potassium chloride (K-DUR) 10 MEQ tablet, Take 10 mEq by mouth 2 (two) times daily., Disp: , Rfl:  .  albuterol (PROVENTIL HFA;VENTOLIN HFA) 108 (90 BASE) MCG/ACT inhaler, Inhale 2 puffs  into the lungs every 6 (six) hours as needed for wheezing or shortness of breath., Disp: , Rfl:  .  carvedilol (COREG) 6.25 MG tablet, Take 1 tablet (6.25 mg total) by mouth 2 (two) times daily with a meal. (Patient not taking: Reported on 07/11/2017), Disp: 60 tablet, Rfl: 2 .  colestipol (COLESTID) 1 g tablet, Take by mouth., Disp: , Rfl:  .  glipiZIDE (GLUCOTROL XL) 10 MG 24 hr tablet, Take 10 mg by mouth daily with breakfast., Disp: , Rfl:  .  ipratropium-albuterol (DUONEB) 0.5-2.5 (3) MG/3ML SOLN, Take 3 mLs by nebulization every 6 (six) hours as needed. (Patient not taking: Reported on 05/29/2017), Disp: 360 mL, Rfl: 0 .  metoprolol tartrate (LOPRESSOR) 25 MG tablet, Take 25 mg by mouth 2 (two) times daily., Disp: , Rfl:  .  senna-docusate (SENOKOT-S) 8.6-50 MG tablet, Take 2 tablets by mouth 2 (two) times daily. (Patient not taking: Reported on 05/29/2017), Disp: 30 tablet, Rfl: 0  No prescriptions prior to admission.     Allergies  Allergen Reactions  . Tramadol Anaphylaxis  . Aspirin      Past Medical History:  Diagnosis Date  . Anemia   . Arthritis    hands, knees, back  . Atrial fibrillation ([place])   . CHF (congestive heart failure) ([place])   . COPD (chronic obstructive pulmonary disease) ([place])   . Diabetes mellitus without complication ([place])   . Dyspnea   .  GERD (gastroesophageal reflux disease)   . Hyperlipidemia   . Hypertension   . PONV (postoperative nausea and vomiting)   . Sleep apnea    told she needs CPAP, does not have  . Wears dentures    full upper and lower.  only wears upper    Review of systems:      Physical Exam  General appearance: alert, cooperative and appears stated age Resp: clear to auscultation bilaterally Cardio: regular rate and rhythm, S1, S2 normal, no murmur, click, rub or gallop GI: soft, non-tender; bowel sounds normal; no masses,  no organomegaly     Planned procedures: Proceed with EGD/Colonoscopy. The patient understands the  nature of the planned procedure, indications, risks, alternatives and potential complications including but not limited to bleeding, infection, perforation, damage to internal organs and possible oversedation/side effects from anesthesia. The patient agrees and gives consent to proceed.  Please refer to procedure notes for findings, recommendations and patient disposition/instructions.    Dorothy [name] K. Alice Reichert, M.D. Gastroenterology 07/11/2017  9:22 AM

## 2017-07-11 NOTE — Anesthesia Postprocedure Evaluation (Signed)
Anesthesia Post Note  Patient: Dorothy [name]  Procedure(s) Performed: ESOPHAGOGASTRODUODENOSCOPY (EGD) WITH PROPOFOL (N/A ) COLONOSCOPY WITH PROPOFOL (N/A )  Patient location during evaluation: PACU Anesthesia Type: General Level of consciousness: awake Pain management: pain level controlled Vital Signs Assessment: post-procedure vital signs reviewed and stable Respiratory status: spontaneous breathing Cardiovascular status: stable Anesthetic complications: no     Last Vitals:  Vitals:   07/11/17 0850 07/11/17 0900  BP: (!) 155/85 (!) 157/67  Pulse: 74 72  Resp: (!) 21 16  Temp:    SpO2: 98% 98%    Last Pain:  Vitals:   07/11/17 0830  TempSrc: Tympanic                 VAN STAVEREN,Dailee Manalang

## 2017-07-11 NOTE — Op Note (Signed)
[facility] Gastroenterology Patient Name: Dorothy [name] Procedure Date: 07/11/2017 7:58 AM MRN: [id number] Account #: [id number] Date of Birth: 1934/10/12 Admit Type: Outpatient Age: 82 Room: [facility] ENDO ROOM 4 Gender: Female Note Status: Finalized Procedure:            Upper GI endoscopy Indications:          Unexplained iron deficiency anemia Providers:            Benay Pike. Alice Reichert MD, MD Referring MD:         Baxter Kail. Rebeca Alert MD, MD (Referring MD) Medicines:            Propofol per Anesthesia Complications:        No immediate complications. Procedure:            Pre-Anesthesia Assessment:                       - The risks and benefits of the procedure and the                        sedation options and risks were discussed with the                        patient. All questions were answered and informed                        consent was obtained.                       - Patient identification and proposed procedure were                        verified prior to the procedure by the nurse. The                        procedure was verified in the procedure room.                       - ASA Grade Assessment: III - A patient with severe                        systemic disease.                       - After reviewing the risks and benefits, the patient                        was deemed in satisfactory condition to undergo the                        procedure.                       After obtaining informed consent, the endoscope was                        passed under direct vision. Throughout the procedure,                        the patient's blood pressure, pulse, and oxygen  saturations were monitored continuously. The Endoscope                        was introduced through the mouth, and advanced to the                        third part of duodenum. The upper GI endoscopy was                        accomplished without difficulty. The  patient tolerated                        the procedure well. Findings:      The examined esophagus was normal.      Evidence of a patent Billroth I gastroduodenostomy was found. A gastric       pouch 3 cm wide with a normal size was found. The gastroduodenal       anastomosis was characterized by healthy appearing mucosa. This was       traversed.      A small hiatal hernia was present.      The exam was otherwise without abnormality.      The examined duodenum was normal.      The examined jejunum was normal. Impression:           - Normal esophagus.                       - Patent Billroth I gastroduodenostomy was found,                        characterized by healthy appearing mucosa.                       - Small hiatal hernia.                       - The examination was otherwise normal.                       - Normal examined duodenum.                       - Normal examined jejunum.                       - No specimens collected. Recommendation:       - See the other procedure note for documentation of                        additional recommendations. Procedure Code(s):    --- Professional ---                       [phone], Esophagogastroduodenoscopy, flexible, transoral;                        diagnostic, including collection of specimen(s) by                        brushing or washing, when performed (separate procedure) Diagnosis Code(s):    --- Professional ---  Z98.0, Intestinal bypass and anastomosis status                       K44.9, Diaphragmatic hernia without obstruction or                        gangrene                       D50.9, Iron deficiency anemia, unspecified CPT copyright 2016 American Medical Association. All rights reserved. The codes documented in this report are preliminary and upon coder review may  be revised to meet current compliance requirements. Efrain Sella MD, MD 07/11/2017 8:12:06 AM This report has been signed  electronically. Number of Addenda: 0 Note Initiated On: 07/11/2017 7:58 AM      [facility]

## 2017-07-11 NOTE — Anesthesia Preprocedure Evaluation (Signed)
Anesthesia Evaluation  Patient identified by MRN, date of birth, ID band Patient awake    Reviewed: Allergy & Precautions  History of Anesthesia Complications (+) PONV  Airway Mallampati: II       Dental  (+) Upper Dentures, Lower Dentures   Pulmonary sleep apnea , COPD,  COPD inhaler,    breath sounds clear to auscultation       Cardiovascular hypertension, Pt. on home beta blockers +CHF   Rhythm:Irregular Rate:Abnormal     Neuro/Psych negative neurological ROS  negative psych ROS   GI/Hepatic Neg liver ROS, GERD  Medicated,  Endo/Other  diabetes, Type 1, Insulin Dependent  Renal/GU negative Renal ROS     Musculoskeletal   Abdominal Normal abdominal exam  (+)   Peds negative pediatric ROS (+)  Hematology  (+) anemia ,   Anesthesia Other Findings   Reproductive/Obstetrics                             Anesthesia Physical Anesthesia Plan  ASA: III  Anesthesia Plan: General   Post-op Pain Management:    Induction: Intravenous  PONV Risk Score and Plan: 0  Airway Management Planned: Natural Airway and Nasal Cannula  Additional Equipment:   Intra-op Plan:   Post-operative Plan:   Informed Consent: I have reviewed the patients History and Physical, chart, labs and discussed the procedure including the risks, benefits and alternatives for the proposed anesthesia with the patient or authorized representative who has indicated his/her understanding and acceptance.     Plan Discussed with: CRNA  Anesthesia Plan Comments:         Anesthesia Quick Evaluation

## 2017-07-11 NOTE — Op Note (Signed)
[facility] Patient Name: Dorothy [name] Procedure Date: 07/11/2017 7:58 AM MRN: [id number] Account #: [id number] Date of Birth: 04-03-1935 Admit Type: Outpatient Age: 82 Room: [facility] ENDO ROOM 4 Gender: Female Note Status: Finalized Procedure:            Colonoscopy Indications:          Unexplained iron deficiency anemia Providers:            Benay Pike. Alice Reichert MD, MD Referring MD:         Baxter Kail. Rebeca Alert MD, MD (Referring MD) Medicines:            Propofol per Anesthesia Complications:        No immediate complications. Procedure:            Pre-Anesthesia Assessment:                       - The risks and benefits of the procedure and the                        sedation options and risks were discussed with the                        patient. All questions were answered and informed                        consent was obtained.                       - Patient identification and proposed procedure were                        verified prior to the procedure by the nurse. The                        procedure was verified in the procedure room.                       - ASA Grade Assessment: III - A patient with severe                        systemic disease.                       - After reviewing the risks and benefits, the patient                        was deemed in satisfactory condition to undergo the                        procedure.                       After obtaining informed consent, the colonoscope was                        passed under direct vision. Throughout the procedure,                        the patient's blood pressure, pulse, and oxygen  saturations were monitored continuously. The                        Colonoscope was introduced through the anus and                        advanced to the the cecum, identified by appendiceal                        orifice and ileocecal valve. The colonoscopy was              performed without difficulty. The patient tolerated the                        procedure well. The quality of the bowel preparation                        was good. The ileocecal valve, appendiceal orifice, and                        rectum were photographed. Findings:      The perianal exam findings include non-thrombosed external hemorrhoids       and internal hemorrhoids that prolapse with straining, but require       manual replacement into the anal canal (Grade III).      Non-bleeding internal hemorrhoids were found during retroflexion. The       hemorrhoids were Grade II (internal hemorrhoids that prolapse but reduce       spontaneously).      Multiple small and large-mouthed diverticula were found in the entire       colon. There was no evidence of diverticular bleeding.      A 4 mm polyp was found in the mid ascending colon. The polyp was       semi-pedunculated. The polyp was removed with a hot snare. Resection and       retrieval were complete.      The exam was otherwise without abnormality. Impression:           - Non-thrombosed external hemorrhoids and internal                        hemorrhoids that prolapse with straining, but require                        manual replacement into the anal canal (Grade III)                        found on perianal exam.                       - Non-bleeding internal hemorrhoids.                       - Moderate diverticulosis in the entire examined colon.                        There was no evidence of diverticular bleeding.                       - One 4 mm polyp in the mid ascending colon, removed  with a hot snare. Resected and retrieved.                       - The examination was otherwise normal. Recommendation:       - Patient has a contact number available for                        emergencies. The signs and symptoms of potential                        delayed complications were discussed with the  patient.                        Return to normal activities tomorrow. Written discharge                        instructions were provided to the patient.                       - Resume previous diet.                       - Continue present medications.                       - Await pathology results.                       - No repeat colonoscopy due to age.                       - Return to GI office PRN.                       - The findings and recommendations were discussed with                        the patient and their family. Procedure Code(s):    --- Professional ---                       [phone], Colonoscopy, flexible; with removal of tumor(s),                        polyp(s), or other lesion(s) by snare technique Diagnosis Code(s):    --- Professional ---                       K64.2, Third degree hemorrhoids                       K64.4, Residual hemorrhoidal skin tags                       D12.2, Benign neoplasm of ascending colon                       D50.9, Iron deficiency anemia, unspecified                       K57.30, Diverticulosis of large intestine without                        perforation or abscess  without bleeding CPT copyright 2016 American Medical Association. All rights reserved. The codes documented in this report are preliminary and upon coder review may  be revised to meet current compliance requirements. Efrain Sella MD, MD 07/11/2017 8:32:03 AM This report has been signed electronically. Number of Addenda: 0 Note Initiated On: 07/11/2017 7:58 AM Scope Withdrawal Time: 0 hours 7 minutes 21 seconds  Total Procedure Duration: 0 hours 13 minutes 18 seconds       [facility]

## 2017-07-11 NOTE — Interval H&P Note (Signed)
History and Physical Interval Note:  07/11/2017 9:24 AM  Dorothy [name]  has presented today for surgery, with the diagnosis of ANEMIA WT LOSS  The various methods of treatment have been discussed with the patient and family. After consideration of risks, benefits and other options for treatment, the patient has consented to  Procedure(s): ESOPHAGOGASTRODUODENOSCOPY (EGD) WITH PROPOFOL (N/A) COLONOSCOPY WITH PROPOFOL (N/A) as a surgical intervention .  The patient's history has been reviewed, patient examined, no change in status, stable for surgery.  I have reviewed the patient's chart and labs.  Questions were answered to the patient's satisfaction.     [place], [place]

## 2017-07-11 NOTE — Transfer of Care (Signed)
Immediate Anesthesia Transfer of Care Note  Patient: Dorothy [name]  Procedure(s) Performed: ESOPHAGOGASTRODUODENOSCOPY (EGD) WITH PROPOFOL (N/A ) COLONOSCOPY WITH PROPOFOL (N/A )  Patient Location: PACU and Endoscopy Unit  Anesthesia Type:General  Level of Consciousness: awake, drowsy and patient cooperative  Airway & Oxygen Therapy: Patient Spontanous Breathing and Patient connected to nasal cannula oxygen  Post-op Assessment: Report given to RN and Post -op Vital signs reviewed and stable  Post vital signs: Reviewed and stable  Last Vitals:  Vitals:   07/11/17 0716 07/11/17 0830  BP: (!) 178/79 (!) 119/58  Pulse: 89 85  Resp: 20 20  Temp: (!) 35.9 C (!) 35.9 C  SpO2: 98% 98%    Last Pain:  Vitals:   07/11/17 0830  TempSrc: Tympanic         Complications: No apparent anesthesia complications

## 2017-07-12 LAB — SURGICAL PATHOLOGY

## 2017-07-13 ENCOUNTER — Encounter: Payer: Self-pay | Admitting: Internal Medicine

## 2017-07-23 ENCOUNTER — Inpatient Hospital Stay: Payer: Medicare Other | Attending: Oncology

## 2017-07-23 ENCOUNTER — Inpatient Hospital Stay: Payer: Medicare Other

## 2017-08-20 ENCOUNTER — Inpatient Hospital Stay: Payer: Medicare Other

## 2017-08-20 ENCOUNTER — Inpatient Hospital Stay: Payer: Medicare Other | Attending: Oncology

## 2017-09-24 ENCOUNTER — Inpatient Hospital Stay: Payer: Medicare Other

## 2017-09-27 ENCOUNTER — Inpatient Hospital Stay: Payer: Medicare Other

## 2017-09-27 ENCOUNTER — Inpatient Hospital Stay: Payer: Medicare Other | Attending: Oncology

## 2017-09-27 DIAGNOSIS — D519 Vitamin B12 deficiency anemia, unspecified: Secondary | ICD-10-CM | POA: Insufficient documentation

## 2017-09-27 DIAGNOSIS — D509 Iron deficiency anemia, unspecified: Secondary | ICD-10-CM | POA: Insufficient documentation

## 2017-09-27 DIAGNOSIS — E538 Deficiency of other specified B group vitamins: Secondary | ICD-10-CM

## 2017-09-27 LAB — IRON AND TIBC
Iron: 48 ug/dL (ref 28–170)
Saturation Ratios: 18 % (ref 10.4–31.8)
TIBC: 267 ug/dL (ref 250–450)
UIBC: 219 ug/dL

## 2017-09-27 LAB — CBC WITH DIFFERENTIAL/PLATELET
Basophils Absolute: 0 10*3/uL (ref 0–0.1)
Basophils Relative: 1 %
Eosinophils Absolute: 0.1 10*3/uL (ref 0–0.7)
Eosinophils Relative: 1 %
HEMATOCRIT: 37.4 % (ref 35.0–47.0)
HEMOGLOBIN: 12.1 g/dL (ref 12.0–16.0)
LYMPHS ABS: 1.3 10*3/uL (ref 1.0–3.6)
LYMPHS PCT: 21 %
MCH: 28.7 pg (ref 26.0–34.0)
MCHC: 32.4 g/dL (ref 32.0–36.0)
MCV: 88.7 fL (ref 80.0–100.0)
MONOS PCT: 6 %
Monocytes Absolute: 0.4 10*3/uL (ref 0.2–0.9)
NEUTROS ABS: 4.3 10*3/uL (ref 1.4–6.5)
NEUTROS PCT: 71 %
Platelets: 214 10*3/uL (ref 150–440)
RBC: 4.22 MIL/uL (ref 3.80–5.20)
RDW: 14.3 % (ref 11.5–14.5)
WBC: 6 10*3/uL (ref 3.6–11.0)

## 2017-09-27 LAB — VITAMIN B12: VITAMIN B 12: 736 pg/mL (ref 180–914)

## 2017-09-27 LAB — FERRITIN: Ferritin: 158 ng/mL (ref 11–307)

## 2017-10-22 ENCOUNTER — Inpatient Hospital Stay: Payer: Medicare Other

## 2017-11-02 NOTE — Progress Notes (Deleted)
Patient ID: Dorothy [name], female    DOB: 12-08-1934, 83 y.o.   MRN: [id number]  HPI  Dorothy [name] is a 83 y/o female with a history of anemia, atrial fibrillation, COPD, DM, GERD, hyperlipidemia, HTN, obstructive sleep apnea (unable to wear CPAP due to cost) and chronic heart failure.   Reviewed last echo report done on 12/19/16 and it showed an EF of 55-60% along with moderate MR and mod/severe TR.   Has not been admitted or been in the ED in the last 6 months.   She presents today for her follow-up visit with a chief complaint of   Past Medical History:  Diagnosis Date  . Anemia   . Arthritis    hands, knees, back  . Atrial fibrillation ([place])   . CHF (congestive heart failure) ([place])   . COPD (chronic obstructive pulmonary disease) ([place])   . Diabetes mellitus without complication ([place])   . Dyspnea   . GERD (gastroesophageal reflux disease)   . Hyperlipidemia   . Hypertension   . PONV (postoperative nausea and vomiting)   . Sleep apnea    told she needs CPAP, does not have  . Wears dentures    full upper and lower.  only wears upper   Past Surgical History:  Procedure Laterality Date  . ABDOMINAL HYSTERECTOMY    . CATARACT EXTRACTION W/PHACO Right 07/12/2016   Procedure: CATARACT EXTRACTION PHACO AND INTRAOCULAR LENS PLACEMENT (IOC);  Surgeon: Leandrew Koyanagi, MD;  Location: [place];  Service: Ophthalmology;  Laterality: Right;  DIABETIC - insulin and oral meds sleep apnea  . CHOLECYSTECTOMY    . COLONOSCOPY N/A 01/19/2015   Procedure: COLONOSCOPY;  Surgeon: Josefine Class, MD;  Location: [facility] ENDOSCOPY;  Service: Endoscopy;  Laterality: N/A;  . COLONOSCOPY WITH PROPOFOL N/A 07/11/2017   Procedure: COLONOSCOPY WITH PROPOFOL;  Surgeon: Toledo, Benay Pike, MD;  Location: [facility] ENDOSCOPY;  Service: Gastroenterology;  Laterality: N/A;  . ESOPHAGOGASTRODUODENOSCOPY N/A 01/19/2015   Procedure: ESOPHAGOGASTRODUODENOSCOPY (EGD);  Surgeon: Josefine Class, MD;   Location: [facility] ENDOSCOPY;  Service: Endoscopy;  Laterality: N/A;  . ESOPHAGOGASTRODUODENOSCOPY (EGD) WITH PROPOFOL N/A 07/11/2017   Procedure: ESOPHAGOGASTRODUODENOSCOPY (EGD) WITH PROPOFOL;  Surgeon: Toledo, Benay Pike, MD;  Location: [facility] ENDOSCOPY;  Service: Gastroenterology;  Laterality: N/A;  . SHOULDER ARTHROSCOPY     No family history on file. Social History   Tobacco Use  . Smoking status: Never Smoker  . Smokeless tobacco: Never Used  Substance Use Topics  . Alcohol use: No   Allergies  Allergen Reactions  . Tramadol Anaphylaxis  . Aspirin      Review of Systems  Constitutional: Positive for fatigue (minimal). Negative for appetite change.  HENT: Positive for rhinorrhea. Negative for congestion, postnasal drip and sore throat.   Eyes: Negative.   Respiratory: Negative for cough, chest tightness and shortness of breath.   Cardiovascular: Positive for leg swelling (minimal edema at the end of the day). Negative for chest pain and palpitations.  Gastrointestinal: Positive for diarrhea (for the last month). Negative for abdominal distention.  Endocrine: Negative.   Genitourinary: Negative.   Musculoskeletal: Positive for back pain. Negative for neck pain.  Skin: Negative.   Allergic/Immunologic: Negative.   Neurological: Negative for dizziness and light-headedness.  Hematological: Negative for adenopathy. Bruises/bleeds easily.  Psychiatric/Behavioral: Negative for dysphoric mood, sleep disturbance (sleeping on 1 pillow ) and suicidal ideas. The patient is not nervous/anxious.     Physical Exam  Constitutional: She is oriented to person, place, and  time. She appears well-developed and well-nourished.  HENT:  Head: Normocephalic and atraumatic.  Neck: Normal range of motion. Neck supple. No JVD present.  Cardiovascular: Normal rate. An irregular rhythm present.  Pulmonary/Chest: Effort normal. She has no wheezes. She has no rales.  Abdominal: Soft. She exhibits no  distension. There is no tenderness.  Musculoskeletal: She exhibits no edema or tenderness.  Neurological: She is [name] and oriented to person, place, and time.  Skin: Skin is warm and dry. No ecchymosis noted.  Psychiatric: She has a normal mood and affect. Her behavior is normal.  Nursing note and vitals reviewed.  Assessment & Plan:  1: Chronic heart failure with preserved ejection fraction- - NYHA class II - euvolemic - is weighing daily and has noticed a continued gradual weight loss as she has noticed a decreased appetite. Reminded to call for an overnight weight gain of >2 pounds or a weekly weight gain of >5 pounds - weight down 14.8 pounds since she was last here and 28.5 pounds down since 12/26/16 - has had diarrhea for the last month and being followed by GI - using Mrs. Dash and she was encouraged to continue following a 2000mg  sodium diet - saw cardiologist (Mulino) 06/04/17 - has had 2 sleep studies done but says that she can't afford the CPAP equipment  2: HTN- - BP elevated today but she says that she hasn't taken any of her medications yet today. Will do so as soon as she gets home.  - encouraged her to take her medications prior to coming to the clinic so that her BP can be assessed - follows with PCP Dorothy [name])  - BMP reviewed from 05/22/17 and it shows sodium 143,  potassium of 3.9 and GFR 53  3: Atrial fibrillation- - currently rate controlled - taking carvedilol - currently not taking anticoagulants as she's waiting on her colonoscopy to get scheduled  4: Diabetes- - glucose in clinic was  - follows with PCP regarding this  Patient did not bring her medications nor a list. Each medication was verbally reviewed with the patient and she was encouraged to bring the bottles to every visit to confirm accuracy of list.

## 2017-11-05 ENCOUNTER — Ambulatory Visit: Payer: Medicare Other | Admitting: Family

## 2017-11-19 ENCOUNTER — Other Ambulatory Visit: Payer: Medicare Other

## 2017-11-19 ENCOUNTER — Inpatient Hospital Stay: Payer: Medicare Other

## 2017-11-19 ENCOUNTER — Ambulatory Visit: Payer: Medicare Other | Admitting: Hematology and Oncology

## 2017-11-20 ENCOUNTER — Inpatient Hospital Stay: Payer: Medicare Other | Attending: Oncology | Admitting: Oncology

## 2017-11-20 ENCOUNTER — Inpatient Hospital Stay: Payer: Medicare Other

## 2017-11-20 ENCOUNTER — Other Ambulatory Visit: Payer: Self-pay

## 2017-11-20 ENCOUNTER — Inpatient Hospital Stay: Payer: Medicare Other | Attending: Oncology

## 2017-11-20 ENCOUNTER — Encounter: Payer: Self-pay | Admitting: Oncology

## 2017-11-20 VITALS — BP 173/78 | HR 75 | Temp 97.0°F | Wt 162.7 lb

## 2017-11-20 DIAGNOSIS — D519 Vitamin B12 deficiency anemia, unspecified: Secondary | ICD-10-CM | POA: Insufficient documentation

## 2017-11-20 DIAGNOSIS — D509 Iron deficiency anemia, unspecified: Secondary | ICD-10-CM | POA: Diagnosis present

## 2017-11-20 DIAGNOSIS — E538 Deficiency of other specified B group vitamins: Secondary | ICD-10-CM

## 2017-11-20 LAB — CBC WITH DIFFERENTIAL/PLATELET
Basophils Absolute: 0 10*3/uL (ref 0–0.1)
Basophils Relative: 0 %
EOS PCT: 2 %
Eosinophils Absolute: 0.1 10*3/uL (ref 0–0.7)
HCT: 34.4 % — ABNORMAL LOW (ref 35.0–47.0)
Hemoglobin: 11.3 g/dL — ABNORMAL LOW (ref 12.0–16.0)
LYMPHS ABS: 1.1 10*3/uL (ref 1.0–3.6)
LYMPHS PCT: 17 %
MCH: 29.1 pg (ref 26.0–34.0)
MCHC: 32.7 g/dL (ref 32.0–36.0)
MCV: 89.1 fL (ref 80.0–100.0)
Monocytes Absolute: 0.4 10*3/uL (ref 0.2–0.9)
Monocytes Relative: 6 %
Neutro Abs: 5.2 10*3/uL (ref 1.4–6.5)
Neutrophils Relative %: 75 %
PLATELETS: 150 10*3/uL (ref 150–440)
RBC: 3.87 MIL/uL (ref 3.80–5.20)
RDW: 15 % — ABNORMAL HIGH (ref 11.5–14.5)
WBC: 6.9 10*3/uL (ref 3.6–11.0)

## 2017-11-20 LAB — IRON AND TIBC
Iron: 32 ug/dL (ref 28–170)
Saturation Ratios: 13 % (ref 10.4–31.8)
TIBC: 248 ug/dL — ABNORMAL LOW (ref 250–450)
UIBC: 216 ug/dL

## 2017-11-20 LAB — VITAMIN B12: VITAMIN B 12: 247 pg/mL (ref 180–914)

## 2017-11-20 LAB — FERRITIN: FERRITIN: 105 ng/mL (ref 11–307)

## 2017-11-20 NOTE — Progress Notes (Signed)
Hematology/Oncology Consult note [facility]  Telephone:([phone] [phone]  Patient Care Team: Dorothy Median, MD as PCP - General Dorothy [name], Dorothy Fey, FNP as Nurse Practitioner (Family Medicine) Dorothy Cowman, MD as Consulting Physician (Cardiology)   Name of the patient: Dorothy [name]  [id number]  02-23-1935   Date of visit: 11/20/17  Diagnosis- iron deficiency and b12 deficiency anemia  Chief complaint/ Reason for visit- routine f/u of B12 and iron deficiency anemia  Heme/Onc history:patient is a 83 year old female who was seen by Dr. Ouida [name] 2016 deficiency anemia. At that time she had an EGD and a colonoscopy which did not reveal any evidence of bleeding. She didn't get better with oral iron and was eventually asked to follow-up with her primary care doctor. She does have a remote h/o Dorothy [name] gastro duodenostomy  Patient was admitted to the hospital for severe allergy angioedema and was found to have a pneumonia and heart failure in April 2018. Her hemoglobin was 7.3 with an MCV of 68. She has received 2 doses of venofer 200 mg along with one unit of blood transfusion. She was seen by Dr. Alice [name] and underwent EGD and colonoscopy in October 2018. EGD showed evidence of Dorothy [name] but ws otherwise normal. Colonoscopy showed diverticulosis, internal and external hemorrhoids and 1 polyp (tubular adenoma). No active bleeding  Iron studies from 12/19/2016 showed a low serum iron of 13 and low iron saturation of 3% and normal TIBC of 426. Ferritin was low at 9 and B12 was normal at 151. Patient has received venofer and B12 injections in the past   Interval history- she is doing well. She lives alone and is independent of her ADL's. Denies any fatigue, blood loss in stool or urine. Denies any dark melanotic stools. Appetite has improved. She is taking PO b12 tablets  ECOG PS- 2 Pain scale- 0   Review of systems- Review of Systems    Constitutional: Negative for chills, fever, malaise/fatigue and weight loss.  HENT: Negative for congestion, ear discharge and nosebleeds.   Eyes: Negative for blurred vision.  Respiratory: Negative for cough, hemoptysis, sputum production, shortness of breath and wheezing.   Cardiovascular: Negative for chest pain, palpitations, orthopnea and claudication.  Gastrointestinal: Negative for abdominal pain, blood in stool, constipation, diarrhea, heartburn, melena, nausea and vomiting.  Genitourinary: Negative for dysuria, flank pain, frequency, hematuria and urgency.  Musculoskeletal: Negative for back pain, joint pain and myalgias.  Skin: Negative for rash.  Neurological: Negative for dizziness, tingling, focal weakness, seizures, weakness and headaches.  Endo/Heme/Allergies: Does not bruise/bleed easily.  Psychiatric/Behavioral: Negative for depression and suicidal ideas. The patient does not have insomnia.        Allergies  Allergen Reactions  . Tramadol Anaphylaxis  . Aspirin      Past Medical History:  Diagnosis Date  . Anemia   . Arthritis    hands, knees, back  . Atrial fibrillation ([place])   . CHF (congestive heart failure) ([place])   . COPD (chronic obstructive pulmonary disease) ([place])   . Diabetes mellitus without complication ([place])   . Dyspnea   . GERD (gastroesophageal reflux disease)   . Hyperlipidemia   . Hypertension   . PONV (postoperative nausea and vomiting)   . Sleep apnea    told she needs CPAP, does not have  . Wears dentures    full upper and lower.  only wears upper     Past Surgical History:  Procedure Laterality Date  .  ABDOMINAL HYSTERECTOMY    . CATARACT EXTRACTION W/PHACO Right 07/12/2016   Procedure: CATARACT EXTRACTION PHACO AND INTRAOCULAR LENS PLACEMENT (IOC);  Surgeon: Dorothy Koyanagi, MD;  Location: [place];  Service: Ophthalmology;  Laterality: Right;  DIABETIC - insulin and oral meds sleep apnea  . CHOLECYSTECTOMY    .  COLONOSCOPY N/A 01/19/2015   Procedure: COLONOSCOPY;  Surgeon: Dorothy Class, MD;  Location: [facility] ENDOSCOPY;  Service: Endoscopy;  Laterality: N/A;  . COLONOSCOPY WITH PROPOFOL N/A 07/11/2017   Procedure: COLONOSCOPY WITH PROPOFOL;  Surgeon: Dorothy [name], Dorothy Pike, MD;  Location: [facility] ENDOSCOPY;  Service: Gastroenterology;  Laterality: N/A;  . ESOPHAGOGASTRODUODENOSCOPY N/A 01/19/2015   Procedure: ESOPHAGOGASTRODUODENOSCOPY (EGD);  Surgeon: Dorothy Class, MD;  Location: [facility] ENDOSCOPY;  Service: Endoscopy;  Laterality: N/A;  . ESOPHAGOGASTRODUODENOSCOPY (EGD) WITH PROPOFOL N/A 07/11/2017   Procedure: ESOPHAGOGASTRODUODENOSCOPY (EGD) WITH PROPOFOL;  Surgeon: Dorothy [name], Dorothy Pike, MD;  Location: [facility] ENDOSCOPY;  Service: Gastroenterology;  Laterality: N/A;  . SHOULDER ARTHROSCOPY      Social History   Socioeconomic History  . Marital status: Widowed    Spouse name: Not on file  . Number of children: Not on file  . Years of education: Not on file  . Highest education level: Not on file  Social Needs  . Financial resource strain: Not on file  . Food insecurity - worry: Not on file  . Food insecurity - inability: Not on file  . Transportation needs - medical: Not on file  . Transportation needs - non-medical: Not on file  Occupational History  . Not on file  Tobacco Use  . Smoking status: Never Smoker  . Smokeless tobacco: Never Used  Substance and Sexual Activity  . Alcohol use: No  . Drug use: Not on file  . Sexual activity: Not on file  Other Topics Concern  . Not on file  Social History Narrative  . Not on file    History reviewed. No pertinent family history.   Current Outpatient Medications:  .  ACCU-CHEK SOFTCLIX LANCETS lancets, , Disp: , Rfl:  .  acetaminophen (TYLENOL) 500 MG tablet, Take 1,000 mg by mouth every 6 (six) hours as needed for moderate pain or headache., Disp: , Rfl:  .  albuterol (PROVENTIL HFA;VENTOLIN HFA) 108 (90 BASE) MCG/ACT inhaler, Inhale 2  puffs into the lungs every 6 (six) hours as needed for wheezing or shortness of breath., Disp: , Rfl:  .  alendronate (FOSAMAX) 70 MG tablet, Take by mouth., Disp: , Rfl:  .  apixaban (ELIQUIS) 2.5 MG TABS tablet, Take by mouth., Disp: , Rfl:  .  atorvastatin (LIPITOR) 80 MG tablet, Take 80 mg by mouth daily., Disp: , Rfl:  .  Calcium Carb-Cholecalciferol (CALCIUM 600 + D PO), Take 1 tablet by mouth., Disp: , Rfl:  .  Calcium Carbonate-Vitamin D (CALCIUM HIGH POTENCY/VITAMIN D) 600-200 MG-UNIT TABS, Take by mouth., Disp: , Rfl:  .  carvedilol (COREG) 6.25 MG tablet, Take 1 tablet (6.25 mg total) by mouth 2 (two) times daily with a meal., Disp: 60 tablet, Rfl: 2 .  cloNIDine (CATAPRES) 0.1 MG tablet, Take 0.1 mg by mouth 2 (two) times daily., Disp: , Rfl:  .  colestipol (COLESTID) 1 g tablet, Take by mouth., Disp: , Rfl:  .  cyanocobalamin (,VITAMIN B-12,) 1000 MCG/ML injection, Inject into the muscle., Disp: , Rfl:  .  esomeprazole (NEXIUM) 20 MG capsule, Take 20 mg by mouth at bedtime., Disp: , Rfl:  .  ferrous sulfate 325 (65 FE)  MG tablet, Take 325 mg by mouth daily with breakfast., Disp: , Rfl:  .  fluticasone (FLONASE) 50 MCG/ACT nasal spray, , Disp: , Rfl:  .  furosemide (LASIX) 20 MG tablet, Take 20 mg by mouth 2 (two) times daily., Disp: , Rfl:  .  glipiZIDE (GLUCOTROL XL) 10 MG 24 hr tablet, Take 10 mg by mouth daily with breakfast., Disp: , Rfl:  .  insulin NPH Human (HUMULIN N,NOVOLIN N) 100 UNIT/ML injection, Inject 0.23 mLs (23 Units total) into the skin 2 (two) times daily before a meal. (Patient taking differently: Inject 25 Units into the skin 2 (two) times daily before a meal. Take 15 units at night), Disp: 10 mL, Rfl: 11 .  ipratropium-albuterol (DUONEB) 0.5-2.5 (3) MG/3ML SOLN, Take 3 mLs by nebulization every 6 (six) hours as needed., Disp: 360 mL, Rfl: 0 .  lisinopril (PRINIVIL,ZESTRIL) 10 MG tablet, Take 10 mg by mouth daily., Disp: , Rfl:  .  metFORMIN (GLUMETZA) 1000 MG  (MOD) 24 hr tablet, Take 1,000 mg by mouth 2 (two) times daily., Disp: , Rfl:  .  metoprolol tartrate (LOPRESSOR) 25 MG tablet, Take 25 mg by mouth 2 (two) times daily., Disp: , Rfl:  .  potassium chloride (K-DUR) 10 MEQ tablet, Take 10 mEq by mouth 2 (two) times daily., Disp: , Rfl:  .  senna-docusate (SENOKOT-S) 8.6-50 MG tablet, Take 2 tablets by mouth 2 (two) times daily., Disp: 30 tablet, Rfl: 0  Physical exam:  Vitals:   11/20/17 1040  BP: (!) 173/78  Pulse: 75  Temp: (!) 97 F (36.1 C)  TempSrc: Tympanic  Weight: 162 lb 11.2 oz (73.8 kg)   Physical Exam  Constitutional: She is oriented to person, place, and time.  Elderly female with a stooped posture. Ambulating with a walker  HENT:  Head: Normocephalic and atraumatic.  Eyes: EOM are normal. Pupils are equal, round, and reactive to light.  Neck: Normal range of motion.  Cardiovascular: Normal rate, regular rhythm and normal heart sounds.  Pulmonary/Chest: Effort normal and breath sounds normal.  Abdominal: Soft. Bowel sounds are normal.  Neurological: She is alert and oriented to person, place, and time.  Skin: Skin is warm and dry.  Mild scattered bruising     CMP Latest Ref Rng & Units 12/27/2016  Glucose 65 - 99 mg/dL 179(H)  BUN 6 - 20 mg/dL 17  Creatinine 0.44 - 1.00 mg/dL 1.20(H)  Sodium 135 - 145 mmol/L 140  Potassium 3.5 - 5.1 mmol/L 3.9  Chloride 101 - 111 mmol/L 98(L)  CO2 22 - 32 mmol/L 31  Calcium 8.9 - 10.3 mg/dL 9.2  Total Protein 6.5 - 8.1 g/dL 6.6  Total Bilirubin 0.3 - 1.2 mg/dL 0.8  Alkaline Phos 38 - 126 U/L 81  AST 15 - 41 U/L 29  ALT 14 - 54 U/L 23   CBC Latest Ref Rng & Units 11/20/2017  WBC 3.6 - 11.0 K/uL 6.9  Hemoglobin 12.0 - 16.0 g/dL 11.3(L)  Hematocrit 35.0 - 47.0 % 34.4(L)  Platelets 150 - 440 K/uL 150     Assessment and plan- Patient is a 83 y.o. female with iron and B12 deficiency anemia possibly due to gastric bypass  H/H stable between 11-12. She is asymptomatic. Iron  studies from today are pending. She is on Po B12. Levels pending. Repeat cbc ferritin and iron studies in 3 and 6 months. [name] will see her back in 6 months. Will also check b12 at that time   Visit  Diagnosis 1. Iron deficiency anemia, unspecified iron deficiency anemia type   2. Anemia due to vitamin B12 deficiency, unspecified B12 deficiency type      Dr. Randa Evens, MD, MPH [facility] at [facility] Pager- [phone] 11/20/2017 10:53 AM

## 2017-11-21 ENCOUNTER — Telehealth: Payer: Self-pay | Admitting: *Deleted

## 2017-11-21 NOTE — Telephone Encounter (Signed)
Called pt 3 times between yest evening and today and there is no voicemail set up for pt and no way to leave a message. Will cont.to try to get in touch with her

## 2017-11-21 NOTE — Telephone Encounter (Signed)
-----   Message from Sindy Guadeloupe, MD sent at 11/20/2017  2:41 PM EDT ----- b12 is going down again. She will need to continue monthly b12 instead of PO. Please let me know if she needs treatment plan for this and let her know as well. Thanks, Astrid Divine

## 2017-12-17 ENCOUNTER — Inpatient Hospital Stay: Payer: Medicare Other

## 2018-02-21 ENCOUNTER — Inpatient Hospital Stay: Payer: Medicare Other | Attending: Oncology

## 2018-02-21 DIAGNOSIS — D519 Vitamin B12 deficiency anemia, unspecified: Secondary | ICD-10-CM | POA: Diagnosis present

## 2018-02-21 DIAGNOSIS — D509 Iron deficiency anemia, unspecified: Secondary | ICD-10-CM | POA: Diagnosis not present

## 2018-02-21 LAB — IRON AND TIBC
Iron: 53 ug/dL (ref 28–170)
Saturation Ratios: 17 % (ref 10.4–31.8)
TIBC: 306 ug/dL (ref 250–450)
UIBC: 253 ug/dL

## 2018-02-21 LAB — CBC
HEMATOCRIT: 35.8 % (ref 35.0–47.0)
Hemoglobin: 11.9 g/dL — ABNORMAL LOW (ref 12.0–16.0)
MCH: 29.1 pg (ref 26.0–34.0)
MCHC: 33.2 g/dL (ref 32.0–36.0)
MCV: 87.5 fL (ref 80.0–100.0)
PLATELETS: 195 10*3/uL (ref 150–440)
RBC: 4.09 MIL/uL (ref 3.80–5.20)
RDW: 13.9 % (ref 11.5–14.5)
WBC: 6.4 10*3/uL (ref 3.6–11.0)

## 2018-02-21 LAB — FERRITIN: Ferritin: 79 ng/mL (ref 11–307)

## 2018-04-26 ENCOUNTER — Encounter: Payer: Medicare Other | Attending: Physician Assistant | Admitting: Physician Assistant

## 2018-04-26 DIAGNOSIS — I4891 Unspecified atrial fibrillation: Secondary | ICD-10-CM | POA: Insufficient documentation

## 2018-04-26 DIAGNOSIS — J449 Chronic obstructive pulmonary disease, unspecified: Secondary | ICD-10-CM | POA: Insufficient documentation

## 2018-04-26 DIAGNOSIS — I110 Hypertensive heart disease with heart failure: Secondary | ICD-10-CM | POA: Diagnosis not present

## 2018-04-26 DIAGNOSIS — H269 Unspecified cataract: Secondary | ICD-10-CM | POA: Insufficient documentation

## 2018-04-26 DIAGNOSIS — E11622 Type 2 diabetes mellitus with other skin ulcer: Secondary | ICD-10-CM | POA: Insufficient documentation

## 2018-04-26 DIAGNOSIS — F419 Anxiety disorder, unspecified: Secondary | ICD-10-CM | POA: Insufficient documentation

## 2018-04-26 DIAGNOSIS — D649 Anemia, unspecified: Secondary | ICD-10-CM | POA: Insufficient documentation

## 2018-04-26 DIAGNOSIS — I509 Heart failure, unspecified: Secondary | ICD-10-CM | POA: Insufficient documentation

## 2018-04-26 DIAGNOSIS — L97822 Non-pressure chronic ulcer of other part of left lower leg with fat layer exposed: Secondary | ICD-10-CM | POA: Diagnosis not present

## 2018-04-26 DIAGNOSIS — Z888 Allergy status to other drugs, medicaments and biological substances status: Secondary | ICD-10-CM | POA: Diagnosis not present

## 2018-04-26 DIAGNOSIS — E11621 Type 2 diabetes mellitus with foot ulcer: Secondary | ICD-10-CM | POA: Diagnosis present

## 2018-04-26 DIAGNOSIS — I890 Lymphedema, not elsewhere classified: Secondary | ICD-10-CM | POA: Insufficient documentation

## 2018-04-26 DIAGNOSIS — M1990 Unspecified osteoarthritis, unspecified site: Secondary | ICD-10-CM | POA: Diagnosis not present

## 2018-04-30 ENCOUNTER — Other Ambulatory Visit: Payer: Self-pay | Admitting: Physician Assistant

## 2018-04-30 DIAGNOSIS — R52 Pain, unspecified: Secondary | ICD-10-CM

## 2018-04-30 DIAGNOSIS — T8189XA Other complications of procedures, not elsewhere classified, initial encounter: Secondary | ICD-10-CM

## 2018-05-07 ENCOUNTER — Ambulatory Visit: Payer: Medicare Other

## 2018-05-08 ENCOUNTER — Ambulatory Visit: Payer: Medicare Other

## 2018-05-09 NOTE — Progress Notes (Signed)
Dorothy [name], Dorothy [name] ([id number]) Visit Report for 04/26/2018 Abuse/Suicide Risk Screen Details Patient Name: Dorothy [name], Dorothy [name]. Date of Service: 04/26/2018 9:45 AM Medical Record Number: [id number] Patient Account Number: [id number] Date of Birth/Sex: [birth year] (83 y.o. F) Treating RN: Roger Shelter Primary Care Deavon Podgorski: Rutherford Guys Other Clinician: Referring Waqas Bruhl: Rutherford Guys Treating Shaylyn Bawa/Extender: STONE III, HOYT Weeks in Treatment: 0 Abuse/Suicide Risk Screen Items Answer ABUSE/SUICIDE RISK SCREEN: Has anyone close to you tried to hurt or harm you recentlyo No Do you feel uncomfortable with anyone in your familyo No Has anyone forced you do things that you didnot want to doo No Do you have any thoughts of harming yourselfo No Patient displays signs or symptoms of abuse and/or neglect. No Electronic Signature(s) Signed: 04/26/2018 4:21:17 PM By: Roger Shelter Entered By: Roger Shelter on 04/26/2018 10:19:27 Dorothy [name] ([id number]) -------------------------------------------------------------------------------- Activities of Daily Living Details Patient Name: Dorothy [name], Dorothy [name]. Date of Service: 04/26/2018 9:45 AM Medical Record Number: [id number] Patient Account Number: [id number] Date of Birth/Sex: [birth year] (83 y.o. F) Treating RN: Roger Shelter Primary Care Kasyn Rolph: Rutherford Guys Other Clinician: Referring Jazzmyne Rasnick: Rutherford Guys Treating Newell Frater/Extender: STONE III, HOYT Weeks in Treatment: 0 Activities of Daily Living Items Answer Activities of Daily Living (Please select one for each item) Drive Automobile Completely Able Take Medications Completely Able Use Telephone Completely Able Care for Appearance Completely Able Use Toilet Completely Able Bath / Shower Completely Able Dress Self Completely Able Feed Self Completely Able Walk Completely Able Get In / Out Bed Completely Able Housework Completely Able Prepare Meals Completely Able Handle  Money Completely Able Shop for Self Completely Able Electronic Signature(s) Signed: 04/26/2018 4:21:17 PM By: Roger Shelter Entered By: Roger Shelter on 04/26/2018 10:19:58 Dorothy [name] ([id number]) -------------------------------------------------------------------------------- Education Assessment Details Patient Name: Dorothy [name]. Date of Service: 04/26/2018 9:45 AM Medical Record Number: [id number] Patient Account Number: [id number] Date of Birth/Sex: 1934/10/08 (83 y.o. F) Treating RN: Roger Shelter Primary Care Jyden Kromer: Rutherford Guys Other Clinician: Referring Enisa Runyan: Rutherford Guys Treating Aquil Duhe/Extender: Melburn Hake, HOYT Weeks in Treatment: 0 Primary Learner Assessed: Patient Learning Preferences/Education Level/Primary Language Learning Preference: Explanation Highest Education Level: High School Preferred Language: English Cognitive Barrier Assessment/Beliefs Language Barrier: No Translator Needed: No Memory Deficit: No Emotional Barrier: No Cultural/Religious Beliefs Affecting Medical Care: No Physical Barrier Assessment Impaired Vision: Yes Glasses Impaired Hearing: No Decreased Hand dexterity: No Knowledge/Comprehension Assessment Knowledge Level: High Comprehension Level: High Ability to understand written High instructions: Ability to understand verbal High instructions: Motivation Assessment Anxiety Level: Calm Cooperation: Cooperative Education Importance: Acknowledges Need Perception: Coherent Willingness to Engage in Self- High Management Activities: Readiness to Engage in Self- High Management Activities: Electronic Signature(s) Signed: 04/26/2018 4:21:17 PM By: Roger Shelter Entered By: Roger Shelter on 04/26/2018 10:20:43 Dorothy [name], Dorothy [name] ([id number]) -------------------------------------------------------------------------------- Fall Risk Assessment Details Patient Name: Dorothy [name]. Date of Service:  04/26/2018 9:45 AM Medical Record Number: [id number] Patient Account Number: [id number] Date of Birth/Sex: 1935/03/25 (83 y.o. F) Treating RN: Roger Shelter Primary Care Darric Plante: Rutherford Guys Other Clinician: Referring Gyan Cambre: Rutherford Guys Treating Mckenzie Bove/Extender: Melburn Hake, HOYT Weeks in Treatment: 0 Fall Risk Assessment Items Have you had 2 or more falls in the last 12 monthso 0 No Have you had any fall that resulted in injury in the last 12 monthso 0 No FALL RISK ASSESSMENT: History of falling - immediate or within 3 months 0 No Secondary diagnosis 0 No Ambulatory aid None/bed rest/wheelchair/nurse 0 No Crutches/cane/walker 0 No Furniture 0 No IV Access/Saline Lock 0 No  Gait/Training Normal/bed rest/immobile 0 No Weak 0 No Impaired 0 No Mental Status Oriented to own ability 0 No Electronic Signature(s) Signed: 04/26/2018 4:21:17 PM By: Roger Shelter Entered By: Roger Shelter on 04/26/2018 10:20:58 Dorothy [name] ([id number]) -------------------------------------------------------------------------------- Foot Assessment Details Patient Name: Dorothy [name]. Date of Service: 04/26/2018 9:45 AM Medical Record Number: [id number] Patient Account Number: [id number] Date of Birth/Sex: [birth year] (83 y.o. F) Treating RN: Roger Shelter Primary Care Aymee Fomby: Rutherford Guys Other Clinician: Referring Nazaret Chea: Rutherford Guys Treating Seara Hinesley/Extender: STONE III, HOYT Weeks in Treatment: 0 Foot Assessment Items Site Locations + = Sensation present, - = Sensation absent, C = Callus, U = Ulcer R = Redness, W = Warmth, M = Maceration, PU = Pre-ulcerative lesion F = Fissure, S = Swelling, D = Dryness Assessment Right: Left: Other Deformity: No No Prior Foot Ulcer: No No Prior Amputation: No No Charcot Joint: No No Ambulatory Status: Ambulatory Without Help Gait: Steady Electronic Signature(s) Signed: 04/26/2018 4:21:17 PM By: Roger Shelter Entered By:  Roger Shelter on 04/26/2018 10:25:31 Dorothy [name] ([id number]) -------------------------------------------------------------------------------- Nutrition Risk Assessment Details Patient Name: Dorothy [name]. Date of Service: 04/26/2018 9:45 AM Medical Record Number: [id number] Patient Account Number: [id number] Date of Birth/Sex: 03/03/1935 (83 y.o. F) Treating RN: Roger Shelter Primary Care Raekwon Winkowski: Rutherford Guys Other Clinician: Referring Marigrace Mccole: Rutherford Guys Treating Marleta Lapierre/Extender: STONE III, HOYT Weeks in Treatment: 0 Height (in): 64 Weight (lbs): 173.4 Body Mass Index (BMI): 29.8 Nutrition Risk Assessment Items NUTRITION RISK SCREEN: I have an illness or condition that made me change the kind and/or amount of 0 No food I eat I eat fewer than two meals per day 0 No I eat few fruits and vegetables, or milk products 0 No I have three or more drinks of beer, liquor or wine almost every day 0 No I have tooth or mouth problems that make it hard for me to eat 0 No I don't always have enough money to buy the food I need 0 No I eat alone most of the time 0 No I take three or more different prescribed or over-the-counter drugs a day 0 No Without wanting to, I have lost or gained 10 pounds in the last six months 0 No I am not always physically able to shop, cook and/or feed myself 0 No Nutrition Protocols Good Risk Protocol 0 No interventions needed Moderate Risk Protocol Electronic Signature(s) Signed: 04/26/2018 4:21:17 PM By: Roger Shelter Entered By: Roger Shelter on 04/26/2018 10:21:36

## 2018-05-09 NOTE — Progress Notes (Signed)
ZANARIA, MORELL ([id number]) Visit Report for 04/26/2018 Chief Complaint Document Details Patient Name: Dorothy [name], Dorothy [name]. Date of Service: 04/26/2018 9:45 AM Medical Record Number: [id number] Patient Account Number: [id number] Date of Birth/Sex: 1934/11/21 (83 y.o. F) Treating RN: Montey Hora Primary Care Provider: Rutherford Guys Other Clinician: Referring Provider: Rutherford Guys Treating Provider/Extender: Melburn Hake, Mazin Emma Weeks in Treatment: 0 Information Obtained from: Patient Chief Complaint Left medial lower extremity ulcer Electronic Signature(s) Signed: 04/29/2018 8:05:21 AM By: Worthy Keeler PA-C Entered By: Worthy Keeler on 04/26/2018 10:44:10 Dorothy [name] ([id number]) -------------------------------------------------------------------------------- Debridement Details Patient Name: Dorothy [name]. Date of Service: 04/26/2018 9:45 AM Medical Record Number: [id number] Patient Account Number: [id number] Date of Birth/Sex: [birth year] (83 y.o. F) Treating RN: Montey Hora Primary Care Provider: Rutherford Guys Other Clinician: Referring Provider: Rutherford Guys Treating Provider/Extender: STONE III, Ally Knodel Weeks in Treatment: 0 Debridement Performed for Wound #1 Left,Medial Lower Leg Assessment: Performed By: Physician STONE III, Jacen Carlini E., PA-C Debridement Type: Chemical/Enzymatic/Mechanical Agent Used: saline Severity of Tissue Pre Fat layer exposed Debridement: Pre-procedure Verification/Time Yes - 11:06 Out Taken: Start Time: 11:06 Pain Control: Lidocaine 4% Topical Solution Instrument: Other : gauze Bleeding: Minimum Hemostasis Achieved: Pressure End Time: 11:07 Procedural Pain: 0 Post Procedural Pain: 0 Response to Treatment: Procedure was tolerated well Level of Consciousness: Awake and Alert Post Debridement Measurements of Total Wound Length: (cm) 0.8 Width: (cm) 0.8 Depth: (cm) 0.1 Volume: (cm) 0.05 Character of Wound/Ulcer Post Debridement:  Improved Severity of Tissue Post Debridement: Fat layer exposed Post Procedure Diagnosis Same as Pre-procedure Electronic Signature(s) Signed: 04/26/2018 5:05:24 PM By: Montey Hora Signed: 04/29/2018 8:05:21 AM By: Worthy Keeler PA-C Entered By: Montey Hora on 04/26/2018 11:07:40 Dorothy [name] ([id number]) -------------------------------------------------------------------------------- HPI Details Patient Name: Dorothy [name]. Date of Service: 04/26/2018 9:45 AM Medical Record Number: [id number] Patient Account Number: [id number] Date of Birth/Sex: 11-Dec-1934 (83 y.o. F) Treating RN: Montey Hora Primary Care Provider: Rutherford Guys Other Clinician: Referring Provider: Rutherford Guys Treating Provider/Extender: Melburn Hake, Tomesha Sargent Weeks in Treatment: 0 History of Present Illness HPI Description: 04/26/18 on evaluation today patient presents for initial evaluation and clinic is referral from the [facility] clinic. She has been experiencing a wound on her left anterior lower extremity which has been intermittently open over the past two years. This is a site where she previously did have a biopsy performed which showed some type of cancer and subsequently she ended up having a larger surgical excision to completely remove everything. It actually appears based on a scar area that may have been more medial to where we're looking at right now. With that being said she does definitely have lymphedema and I do believe that this may be more of a lymphedema lesion as compared to truly be in a cancerous lesion. Nonetheless I explained to the patient as well is her family member who was with her today that I'll be see I cannot be completely certain of that without biopsy. No other diagnostic imaging has been performed at this point. Patient does have diabetes her hemoglobin A1c most recent was 7.4% and this was on Jan 15, 2018. Upon further review of her labs from this date as well she also had  an elevated creatinine and a glomerular filtration rate of 40 which was obviously low. She also did have a wound culture which was performed on 03/27/18. This revealed Staphylococcus aureus and the patient was originally given Keflex but then changed to a stronger antibiotic I'm unsure of what  the antibiotic she was changed to was. Nonetheless that seems to have resolved the issue there does not appear to be any evidence of infection at this point. Her ABI's are noncompressible. No fevers, chills, nausea, or vomiting noted at this time. Other than the diabetes patient does have a history of lymphedema as well as chronic atrial fibrillation and COPD. Electronic Signature(s) Signed: 04/29/2018 8:05:21 AM By: Worthy Keeler PA-C Entered By: Worthy Keeler on 04/26/2018 17:25:10 Dorothy [name] ([id number]) -------------------------------------------------------------------------------- Physical Exam Details Patient Name: Dorothy [name]. Date of Service: 04/26/2018 9:45 AM Medical Record Number: [id number] Patient Account Number: [id number] Date of Birth/Sex: 1935/01/25 (83 y.o. F) Treating RN: Montey Hora Primary Care Provider: Rutherford Guys Other Clinician: Referring Provider: Rutherford Guys Treating Provider/Extender: STONE III, Kamaree Wheatley Weeks in Treatment: 0 Constitutional patient is hypertensive.. pulse regular and within target range for patient.Marland Kitchen respirations regular, non-labored and within target range for patient.Marland Kitchen temperature within target range for patient.. Well-nourished and well-hydrated in no acute distress. Eyes conjunctiva clear no eyelid edema noted. pupils equal round and reactive to light and accommodation. Ears, Nose, Mouth, and Throat no gross abnormality of ear auricles or external auditory canals. normal hearing noted during conversation. mucus membranes moist. Respiratory normal breathing without difficulty. clear to auscultation bilaterally. Cardiovascular regular  rate and rhythm with normal S1, S2. 1+ dorsalis pedis/posterior tibialis pulses. Bilateral stage 3 lymphedema noted. Gastrointestinal (GI) soft, non-tender, non-distended, +BS. no ventral hernia noted. Musculoskeletal Patient unable to walk without assistance. no significant deformity or arthritic changes, no loss or range of motion, no clubbing. Psychiatric this patient is able to make decisions and demonstrates good insight into disease process. Alert and Oriented x 3. pleasant and cooperative. Notes Patient's wound bed did have some dry skin covering upon initial inspection today. This really does not have the obvious appearance of a cancerous lesion in my pinion. With that being said obviously being an area where she had a previous cancer that does at least lend some suspicion to a concern that regard. Nonetheless if everything heals well as I explained to the patient and she seems to have no significant issues then I would lean towards this being more of a lymphedema/stasis ulcer. Nonetheless if she in the end is having a more abnormal healing course or anything that looks questionable my suggestion would be for [name] to proceed with biopsy. They understand and the patient opted to hold off on biopsy today. Electronic Signature(s) Signed: 04/29/2018 8:05:21 AM By: Worthy Keeler PA-C Entered By: Worthy Keeler on 04/26/2018 17:26:28 Dorothy [name] ([id number]) -------------------------------------------------------------------------------- Physician Orders Details Patient Name: Dorothy [name]. Date of Service: 04/26/2018 9:45 AM Medical Record Number: [id number] Patient Account Number: [id number] Date of Birth/Sex: 03-25-1935 (83 y.o. F) Treating RN: Montey Hora Primary Care Provider: Rutherford Guys Other Clinician: Referring Provider: Rutherford Guys Treating Provider/Extender: Melburn Hake, Mak Bonny Weeks in Treatment: 0 Verbal / Phone Orders: No Diagnosis Coding ICD-10 Coding Code  Description E11.622 Type 2 diabetes mellitus with other skin ulcer L97.822 Non-pressure chronic ulcer of other part of left lower leg with fat layer exposed I10 Essential (primary) hypertension I48.2 Chronic atrial fibrillation J44.9 Chronic obstructive pulmonary disease, unspecified Wound Cleansing Wound #1 Left,Medial Lower Leg o Clean wound with Normal Saline. o May Shower, gently pat wound dry prior to applying new dressing. Anesthetic (add to Medication List) Wound #1 Left,Medial Lower Leg o Topical Lidocaine 4% cream applied to wound bed prior to debridement (In Clinic Only). Skin Barriers/Peri-Wound Care  Wound #1 Left,Medial Lower Leg o Skin Prep Primary Wound Dressing Wound #1 Left,Medial Lower Leg o Silver Collagen - moistened with saline Secondary Dressing Wound #1 Left,Medial Lower Leg o Boardered Foam Dressing Dressing Change Frequency Wound #1 Left,Medial Lower Leg o Three times weekly Follow-up Appointments Wound #1 Left,Medial Lower Leg o Return Appointment in 2 weeks. Edema Control Wound #1 Left,Medial Lower Leg o Elevate legs to the level of the heart and pump ankles as often as possible Abood, Teela F. ([id number]) Additional Orders / Instructions Wound #1 Left,Medial Lower Leg o Increase protein intake. o Activity as tolerated o Other: - try to keep blood sugars below 180 Services and Therapies o Arterial Studies- Bilateral - at [place] Signature(s) Signed: 04/26/2018 5:05:24 PM By: Montey Hora Signed: 04/29/2018 8:05:21 AM By: Worthy Keeler PA-C Entered By: Montey Hora on 04/26/2018 11:12:34 Dorothy [name] ([id number]) -------------------------------------------------------------------------------- Problem List Details Patient Name: RAYMONDA, PELL. Date of Service: 04/26/2018 9:45 AM Medical Record Number: [id number] Patient Account Number: [id number] Date of Birth/Sex: 1935-07-08 (83 y.o. F) Treating RN:  Montey Hora Primary Care Provider: Rutherford Guys Other Clinician: Referring Provider: Rutherford Guys Treating Provider/Extender: Melburn Hake, Jakwan Sally Weeks in Treatment: 0 Active Problems ICD-10 Evaluated Encounter Code Description Active Date Today Diagnosis I89.0 Lymphedema, not elsewhere classified 04/26/2018 No Yes E11.622 Type 2 diabetes mellitus with other skin ulcer 04/26/2018 No Yes L97.822 Non-pressure chronic ulcer of other part of left lower leg with 04/26/2018 No Yes fat layer exposed I10 Essential (primary) hypertension 04/26/2018 No Yes I48.2 Chronic atrial fibrillation 04/26/2018 No Yes J44.9 Chronic obstructive pulmonary disease, unspecified 04/26/2018 No Yes Inactive Problems Resolved Problems Electronic Signature(s) Signed: 04/29/2018 8:05:21 AM By: Worthy Keeler PA-C Entered By: Worthy Keeler on 04/26/2018 17:25:00 Dorothy [name] ([id number]) -------------------------------------------------------------------------------- Progress Note Details Patient Name: Dorothy [name]. Date of Service: 04/26/2018 9:45 AM Medical Record Number: [id number] Patient Account Number: [id number] Date of Birth/Sex: 07-26-1935 (83 y.o. F) Treating RN: Montey Hora Primary Care Provider: Rutherford Guys Other Clinician: Referring Provider: Rutherford Guys Treating Provider/Extender: Melburn Hake, Jarick Harkins Weeks in Treatment: 0 Subjective Chief Complaint Information obtained from Patient Left medial lower extremity ulcer History of Present Illness (HPI) 04/26/18 on evaluation today patient presents for initial evaluation and clinic is referral from the [facility] clinic. She has been experiencing a wound on her left anterior lower extremity which has been intermittently open over the past two years. This is a site where she previously did have a biopsy performed which showed some type of cancer and subsequently she ended up having a larger surgical excision to completely remove everything. It  actually appears based on a scar area that may have been more medial to where we're looking at right now. With that being said she does definitely have lymphedema and I do believe that this may be more of a lymphedema lesion as compared to truly be in a cancerous lesion. Nonetheless I explained to the patient as well is her family member who was with her today that I'll be see I cannot be completely certain of that without biopsy. No other diagnostic imaging has been performed at this point. Patient does have diabetes her hemoglobin A1c most recent was 7.4% and this was on Jan 15, 2018. Upon further review of her labs from this date as well she also had an elevated creatinine and a glomerular filtration rate of 40 which was obviously low. She also did have a wound culture which was performed on 03/27/18.  This revealed Staphylococcus aureus and the patient was originally given Keflex but then changed to a stronger antibiotic I'm unsure of what the antibiotic she was changed to was. Nonetheless that seems to have resolved the issue there does not appear to be any evidence of infection at this point. Her ABI's are noncompressible. No fevers, chills, nausea, or vomiting noted at this time. Other than the diabetes patient does have a history of lymphedema as well as chronic atrial fibrillation and COPD. Wound History Patient presents with 1 open wound that has been present for approximately 2 years. Patient has been treating wound in the following manner: no dressings. The wound has been healed in the past but has re-opened. Laboratory tests have not been performed in the last month. Patient reportedly has not tested positive for an antibiotic resistant organism. Patient reportedly has not tested positive for osteomyelitis. Patient reportedly has not had testing performed to evaluate circulation in the legs. Patient experiences the following problems associated with their wounds: infection. Patient  History Information obtained from Patient. Allergies tramadol Family History Cancer - Father,Siblings, Diabetes - Maternal Grandparents, Heart Disease - Father, Hypertension - Mother,Father,Siblings, Lung Disease - Father,Siblings, Stroke - Father, No family history of Kidney Disease, Seizures, Thyroid Problems, Tuberculosis. Social History Never smoker, Marital Status - Widowed, Alcohol Use - Never, Drug Use - No History, Caffeine Use - Daily. Medical History Eyes DOMINIK, LAURICELLA ([id number]) Patient has history of Cataracts - removed left Denies history of Glaucoma, Optic Neuritis Ear/Nose/Mouth/Throat Denies history of Chronic sinus problems/congestion, Middle ear problems Hematologic/Lymphatic Patient has history of Anemia - 2018 Denies history of Hemophilia, Human Immunodeficiency Virus, Lymphedema, Sickle Cell Disease Respiratory Patient has history of Chronic Obstructive Pulmonary Disease (COPD) Denies history of Aspiration, Asthma, Pneumothorax, Sleep Apnea, Tuberculosis Cardiovascular Patient has history of Arrhythmia - afib, Congestive Heart Failure, Hypertension Denies history of Coronary Artery Disease, Deep Vein Thrombosis, Hypotension, Myocardial Infarction, Peripheral Arterial Disease, Peripheral Venous Disease, Phlebitis, Vasculitis Gastrointestinal Denies history of Cirrhosis , Colitis, Crohn s, Hepatitis B Endocrine Patient has history of Type II Diabetes Genitourinary Denies history of End Stage Renal Disease Immunological Denies history of Lupus Erythematosus, Raynaud s, Scleroderma Integumentary (Skin) Denies history of History of Burn, History of pressure wounds Musculoskeletal Patient has history of Osteoarthritis Denies history of Gout, Rheumatoid Arthritis Neurologic Denies history of Dementia, Neuropathy, Quadriplegia, Paraplegia, Seizure Disorder Psychiatric Patient has history of Confinement Anxiety Denies history of Anorexia/bulimia Patient  is treated with Insulin, Oral Agents. Blood sugar is tested. Blood sugar results noted at the following times: Bedtime - 130. Review of Systems (ROS) Eyes Complains or has symptoms of Glasses / Contacts - reading glasses. Denies complaints or symptoms of Dry Eyes. Ear/Nose/Mouth/Throat Denies complaints or symptoms of Difficult clearing ears, Sinusitis. Hematologic/Lymphatic Denies complaints or symptoms of Bleeding / Clotting Disorders. Respiratory Complains or has symptoms of Shortness of Breath - with exertion. Denies complaints or symptoms of Chronic or frequent coughs. Cardiovascular Denies complaints or symptoms of Chest pain, LE edema. Gastrointestinal Complains or has symptoms of Frequent diarrhea. Denies complaints or symptoms of Nausea, Vomiting. Endocrine Denies complaints or symptoms of Hepatitis, Thyroid disease, Polydypsia (Excessive Thirst). Genitourinary Denies complaints or symptoms of Kidney failure/ Dialysis, Incontinence/dribbling. Immunological Denies complaints or symptoms of Hives, Itching. Integumentary (Skin) Complains or has symptoms of Wounds, Bleeding or bruising tendency. MALLY, GAVINA F. ([id number]) Denies complaints or symptoms of Breakdown, Swelling. Musculoskeletal Complains or has symptoms of Muscle Weakness. Neurologic Denies complaints or symptoms of Numbness/parasthesias, Focal/Weakness. Oncologic  The patient has no complaints or symptoms. Psychiatric Complains or has symptoms of Anxiety, Claustrophobia. Objective Constitutional patient is hypertensive.. pulse regular and within target range for patient.Marland Kitchen respirations regular, non-labored and within target range for patient.Marland Kitchen temperature within target range for patient.. Well-nourished and well-hydrated in no acute distress. Vitals Time Taken: 10:07 AM, Height: 64 in, Source: Stated, Weight: 173.4 lbs, Source: Measured, BMI: 29.8, Temperature: 97.8 F, Pulse: 82 bpm, Respiratory Rate: 18  breaths/min, Blood Pressure: 185/78 mmHg. Eyes conjunctiva clear no eyelid edema noted. pupils equal round and reactive to light and accommodation. Ears, Nose, Mouth, and Throat no gross abnormality of ear auricles or external auditory canals. normal hearing noted during conversation. mucus membranes moist. Respiratory normal breathing without difficulty. clear to auscultation bilaterally. Cardiovascular regular rate and rhythm with normal S1, S2. 1+ dorsalis pedis/posterior tibialis pulses. Bilateral stage 3 lymphedema noted. Gastrointestinal (GI) soft, non-tender, non-distended, +BS. no ventral hernia noted. Musculoskeletal Patient unable to walk without assistance. no significant deformity or arthritic changes, no loss or range of motion, no clubbing. Psychiatric this patient is able to make decisions and demonstrates good insight into disease process. Alert and Oriented x 3. pleasant and cooperative. General Notes: Patient's wound bed did have some dry skin covering upon initial inspection today. This really does not have the obvious appearance of a cancerous lesion in my pinion. With that being said obviously being an area where she had a previous cancer that does at least lend some suspicion to a concern that regard. Nonetheless if everything heals well as I explained to the patient and she seems to have no significant issues then I would lean towards this being more of a lymphedema/stasis ulcer. Nonetheless if she in the end is having a more abnormal healing course or anything that looks questionable my suggestion would be for [name] to proceed with biopsy. They understand and the patient opted to hold off on [employer], DANICE DIPPOLITO. ([id number]) biopsy today. Integumentary (Hair, Skin) Wound #1 status is Open. Original cause of wound was Gradually Appeared. The wound is located on the Left,Medial Lower Leg. The wound measures 1.5cm length x 1.5cm width x 0.1cm depth; 1.767cm^2 area and  0.177cm^3 volume. There is no tunneling or undermining noted. There is a medium amount of serous drainage noted. The wound margin is indistinct and nonvisible. There is medium (34-66%) red granulation within the wound bed. There is a medium (34-66%) amount of necrotic tissue within the wound bed including Eschar. The periwound skin appearance exhibited: Dry/Scaly. The periwound skin appearance did not exhibit: Callus, Crepitus, Excoriation, Induration, Rash, Scarring, Maceration, Atrophie Blanche, Cyanosis, Ecchymosis, Hemosiderin Staining, Mottled, Pallor, Rubor, Erythema. Assessment Active Problems ICD-10 Lymphedema, not elsewhere classified Type 2 diabetes mellitus with other skin ulcer Non-pressure chronic ulcer of other part of left lower leg with fat layer exposed Essential (primary) hypertension Chronic atrial fibrillation Chronic obstructive pulmonary disease, unspecified Procedures Wound #1 Pre-procedure diagnosis of Wound #1 is a Diabetic Wound/Ulcer of the Lower Extremity located on the Left,Medial Lower Leg .Severity of Tissue Pre Debridement is: Fat layer exposed. There was a Chemical/Enzymatic/Mechanical debridement performed by STONE III, Tranika Scholler E., PA-C. With the following instrument(s): gauze after achieving pain control using Lidocaine 4% Topical Solution. Other agent used was saline. A time out was conducted at 11:06, prior to the start of the procedure. A Minimum amount of bleeding was controlled with Pressure. The procedure was tolerated well with a pain level of 0 throughout and a pain level of 0 following the procedure.  Patient s Level of Consciousness post procedure was recorded as Awake and Alert. Post Debridement Measurements: 0.8cm length x 0.8cm width x 0.1cm depth; 0.05cm^3 volume. Character of Wound/Ulcer Post Debridement is improved. Severity of Tissue Post Debridement is: Fat layer exposed. Post procedure Diagnosis Wound #1: Same as Pre-Procedure Plan Wound  Cleansing: Wound #1 Left,Medial Lower Leg: Clean wound with Normal Saline. May Shower, gently pat wound dry prior to applying new dressing. Anesthetic (add to Medication List): Wound #1 Left,Medial Lower Leg: Topical Lidocaine 4% cream applied to wound bed prior to debridement (In Clinic Only). DEWANNA, HURSTON ([id number]) Skin Barriers/Peri-Wound Care: Wound #1 Left,Medial Lower Leg: Skin Prep Primary Wound Dressing: Wound #1 Left,Medial Lower Leg: Silver Collagen - moistened with saline Secondary Dressing: Wound #1 Left,Medial Lower Leg: Boardered Foam Dressing Dressing Change Frequency: Wound #1 Left,Medial Lower Leg: Three times weekly Follow-up Appointments: Wound #1 Left,Medial Lower Leg: Return Appointment in 2 weeks. Edema Control: Wound #1 Left,Medial Lower Leg: Elevate legs to the level of the heart and pump ankles as often as possible Additional Orders / Instructions: Wound #1 Left,Medial Lower Leg: Increase protein intake. Activity as tolerated Other: - try to keep blood sugars below 180 Services and Therapies ordered were: Arterial Studies- [facility]ilateral - at [facility] Currently my suggestion based on what I'm seeing at this point is going to be that we initiate treatment per above for this lesion. I'm not recommending compression at this point due to the fact we could not obtain ABI's. I'm sending her for testing in regards arterial studies bilaterally in [facility]. We will subsequently see what the shows and then initiate compressive therapy as needed following. I do believe that the patient should be trying to wear her compression stockings which would be of great benefit for her. Otherwise we gonna see her back for reevaluation in two weeks time to see were things stand. This is more due to transportation issues that anything her family member can only bring her every other Friday as that's when she is off. Please see above for specific wound care orders. We will see  patient for re-evaluation in 2 week(s) here in the clinic. If anything worsens or changes patient will contact our office for additional recommendations. We will consider a biopsy at the next visit if the patient does not seem to be making the progress that would be expected or if there's anything questionable about the appearance of the wound in general. Electronic Signature(s) Signed: 04/29/2018 8:05:21 AM By: Worthy Keeler PA-C Entered By: Worthy Keeler on 04/26/2018 17:28:12 Dorothy [name] ([id number]) -------------------------------------------------------------------------------- ROS/PFSH Details Patient Name: Dorothy [name]. Date of Service: 04/26/2018 9:45 AM Medical Record Number: [id number] Patient Account Number: [id number] Date of Birth/Sex: Dec 10, 1934 (83 y.o. F) Treating RN: Roger Shelter Primary Care Provider: Rutherford Guys Other Clinician: Referring Provider: Rutherford Guys Treating Provider/Extender: STONE III, Venancio Chenier Weeks in Treatment: 0 Information Obtained From Patient Wound History Do you currently have one or more open woundso Yes How many open wounds do you currently haveo 1 Approximately how long have you had your woundso 2 years How have you been treating your wound(s) until nowo no dressings Has your wound(s) ever healed and then re-openedo Yes Have you had any lab work done in the past montho No Have you tested positive for an antibiotic resistant organism (MRSA, VRE)o No Have you tested positive for osteomyelitis (bone infection)o No Have you had any tests for circulation on your legso No Have you had other problems  associated with your woundso Infection Eyes Complaints and Symptoms: Positive for: Glasses / Contacts - reading glasses Negative for: Dry Eyes Medical History: Positive for: Cataracts - removed left Negative for: Glaucoma; Optic Neuritis Ear/Nose/Mouth/Throat Complaints and Symptoms: Negative for: Difficult clearing ears;  Sinusitis Medical History: Negative for: Chronic sinus problems/congestion; Middle ear problems Hematologic/Lymphatic Complaints and Symptoms: Negative for: Bleeding / Clotting Disorders Medical History: Positive for: Anemia - 2018 Negative for: Hemophilia; Human Immunodeficiency Virus; Lymphedema; Sickle Cell Disease Respiratory Complaints and Symptoms: Positive for: Shortness of Breath - with exertion Negative for: Chronic or frequent coughs Medical History: Positive for: Chronic Obstructive Pulmonary Disease (COPD) Mayon, Donyel F. ([id number]) Negative for: Aspiration; Asthma; Pneumothorax; Sleep Apnea; Tuberculosis Cardiovascular Complaints and Symptoms: Negative for: Chest pain; LE edema Medical History: Positive for: Arrhythmia - afib; Congestive Heart Failure; Hypertension Negative for: Coronary Artery Disease; Deep Vein Thrombosis; Hypotension; Myocardial Infarction; Peripheral Arterial Disease; Peripheral Venous Disease; Phlebitis; Vasculitis Gastrointestinal Complaints and Symptoms: Positive for: Frequent diarrhea Negative for: Nausea; Vomiting Medical History: Negative for: Cirrhosis ; Colitis; Crohnos; Hepatitis B Endocrine Complaints and Symptoms: Negative for: Hepatitis; Thyroid disease; Polydypsia (Excessive Thirst) Medical History: Positive for: Type II Diabetes Time with diabetes: `15 years Treated with: Insulin, Oral agents Blood sugar tested every day: Yes Tested : bid Blood sugar testing results: Bedtime: 130 Genitourinary Complaints and Symptoms: Negative for: Kidney failure/ Dialysis; Incontinence/dribbling Medical History: Negative for: End Stage Renal Disease Immunological Complaints and Symptoms: Negative for: Hives; Itching Medical History: Negative for: Lupus Erythematosus; Raynaudos; Scleroderma Integumentary (Skin) Complaints and Symptoms: Positive for: Wounds; Bleeding or bruising tendency Negative for: Breakdown; Swelling Medical  History: Negative for: History of Burn; History of pressure wounds Balgobin, Aleiyah F. ([id number]) Musculoskeletal Complaints and Symptoms: Positive for: Muscle Weakness Medical History: Positive for: Osteoarthritis Negative for: Gout; Rheumatoid Arthritis Neurologic Complaints and Symptoms: Negative for: Numbness/parasthesias; Focal/Weakness Medical History: Negative for: Dementia; Neuropathy; Quadriplegia; Paraplegia; Seizure Disorder Psychiatric Complaints and Symptoms: Positive for: Anxiety; Claustrophobia Medical History: Positive for: Confinement Anxiety Negative for: Anorexia/bulimia Oncologic Complaints and Symptoms: No Complaints or Symptoms HBO Extended History Items Eyes: Cataracts Immunizations Pneumococcal Vaccine: Received Pneumococcal Vaccination: Yes Implantable Devices Family and Social History Cancer: Yes - Father,Siblings; Diabetes: Yes - Maternal Grandparents; Heart Disease: Yes - Father; Hypertension: Yes - Mother,Father,Siblings; Kidney Disease: No; Lung Disease: Yes - Father,Siblings; Seizures: No; Stroke: Yes - Father; Thyroid Problems: No; Tuberculosis: No; Never smoker; Marital Status - Widowed; Alcohol Use: Never; Drug Use: No History; Caffeine Use: Daily; Financial Concerns: No; Food, Clothing or Shelter Needs: No; Support System Lacking: No; Transportation Concerns: No; Advanced Directives: No; Patient does not want information on Advanced Directives; Do not resuscitate: No; Living Will: No; Medical Power of Attorney: No Electronic Signature(s) Signed: 04/26/2018 4:21:17 PM By: Roger Shelter Signed: 04/29/2018 8:05:21 AM By: Worthy Keeler PA-C Entered By: Roger Shelter on 04/26/2018 10:19:15 Dorothy [name] ([id number]) -------------------------------------------------------------------------------- SuperBill Details Patient Name: Dorothy [name]. Date of Service: 04/26/2018 Medical Record Number: [id number] Patient Account Number:  [id number] Date of Birth/Sex: 08/03/1935 (83 y.o. F) Treating RN: Montey Hora Primary Care Provider: Rutherford Guys Other Clinician: Referring Provider: Rutherford Guys Treating Provider/Extender: Melburn Hake, Fronie Holstein Weeks in Treatment: 0 Diagnosis Coding ICD-10 Codes Code Description I89.0 Lymphedema, not elsewhere classified E11.622 Type 2 diabetes mellitus with other skin ulcer L97.822 Non-pressure chronic ulcer of other part of left lower leg with fat layer exposed I10 Essential (primary) hypertension I48.2 Chronic atrial fibrillation J44.9 Chronic obstructive pulmonary disease, unspecified Facility Procedures CPT4 Code: [id number]  Description: 99214 - WOUND CARE VISIT-LEV 4 EST PT Modifier: Quantity: 1 Physician Procedures CPT4 Code Description: 4720721 82883 - WC PHYS LEVEL 4 - NEW PT ICD-10 Diagnosis Description E11.622 Type 2 diabetes mellitus with other skin ulcer L97.822 Non-pressure chronic ulcer of other part of left lower leg wit I89.0 Lymphedema, not elsewhere  classified I10 Essential (primary) hypertension Modifier: h fat layer expos Quantity: 1 ed Electronic Signature(s) Signed: 04/29/2018 8:05:21 AM By: Worthy Keeler PA-C Previous Signature: 04/26/2018 1:18:36 PM Version By: Montey Hora Entered By: Worthy Keeler on 04/26/2018 17:28:44

## 2018-05-09 NOTE — Progress Notes (Signed)
Dorothy [name], Dorothy [name] ([id number]) Visit Report for 04/26/2018 Allergy List Details Patient Name: Dorothy [name], Dorothy [name]. Date of Service: 04/26/2018 9:45 AM Medical Record Number: [id number] Patient Account Number: [id number] Date of Birth/Sex: Jul 30, 1935 (83 y.o. F) Treating RN: Roger Shelter Primary Care Mohit Zirbes: Rutherford Guys Other Clinician: Referring Genesis Novosad: Rutherford Guys Treating Catarino Vold/Extender: STONE III, HOYT Weeks in Treatment: 0 Allergies Active Allergies tramadol Allergy Notes Electronic Signature(s) Signed: 04/26/2018 4:21:17 PM By: Roger Shelter Entered By: Roger Shelter on 04/26/2018 10:09:29 Dorothy [name] ([id number]) -------------------------------------------------------------------------------- Arrival Information Details Patient Name: Dorothy [name]. Date of Service: 04/26/2018 9:45 AM Medical Record Number: [id number] Patient Account Number: [id number] Date of Birth/Sex: 07/09/1935 (83 y.o. F) Treating RN: Roger Shelter Primary Care Daphene Chisholm: Rutherford Guys Other Clinician: Referring Jvon Meroney: Rutherford Guys Treating Korena Nass/Extender: Melburn Hake, HOYT Weeks in Treatment: 0 Visit Information Patient Arrived: Ambulatory Arrival Time: 10:06 Accompanied By: grand daughter Transfer Assistance: None Patient Identification Verified: Yes Secondary Verification Process Yes Completed: Patient Has Alerts: Yes Patient Alerts: Patient on Blood Thinner Eliquis DMII Electronic Signature(s) Signed: 04/26/2018 5:05:24 PM By: Montey Hora Entered By: Montey Hora on 04/26/2018 11:04:25 Dorothy [name] ([id number]) -------------------------------------------------------------------------------- Clinic Level of Care Assessment Details Patient Name: Dorothy [name]. Date of Service: 04/26/2018 9:45 AM Medical Record Number: [id number] Patient Account Number: [id number] Date of Birth/Sex: 05-22-1935 (83 y.o. F) Treating RN: Montey Hora Primary Care Caydance Kuehnle:  Rutherford Guys Other Clinician: Referring Bryndle Corredor: Rutherford Guys Treating Mak Bonny/Extender: Melburn Hake, HOYT Weeks in Treatment: 0 Clinic Level of Care Assessment Items TOOL 2 Quantity Score []  - Use when only an EandM is performed on the INITIAL visit 0 ASSESSMENTS - Nursing Assessment / Reassessment X - General Physical Exam (combine w/ comprehensive assessment (listed just below) when 1 20 performed on new pt. evals) X- 1 25 Comprehensive Assessment (HX, ROS, Risk Assessments, Wounds Hx, etc.) ASSESSMENTS - Wound and Skin Assessment / Reassessment X - Simple Wound Assessment / Reassessment - one wound 1 5 []  - 0 Complex Wound Assessment / Reassessment - multiple wounds []  - 0 Dermatologic / Skin Assessment (not related to wound area) ASSESSMENTS - Ostomy and/or Continence Assessment and Care []  - Incontinence Assessment and Management 0 []  - 0 Ostomy Care Assessment and Management (repouching, etc.) PROCESS - Coordination of Care X - Simple Patient / Family Education for ongoing care 1 15 []  - 0 Complex (extensive) Patient / Family Education for ongoing care X- 1 10 Staff obtains Programmer, systems, Records, Test Results / Process Orders []  - 0 Staff telephones HHA, Nursing Homes / Clarify orders / etc []  - 0 Routine Transfer to another Facility (non-emergent condition) []  - 0 Routine Hospital Admission (non-emergent condition) X- 1 15 New Admissions / Biomedical engineer / Ordering NPWT, Apligraf, etc. []  - 0 Emergency Hospital Admission (emergent condition) X- 1 10 Simple Discharge Coordination []  - 0 Complex (extensive) Discharge Coordination PROCESS - Special Needs []  - Pediatric / Minor Patient Management 0 []  - 0 Isolation Patient Management Dorothy [name], Dorothy [name]. ([id number]) []  - 0 Hearing / Language / Visual special needs []  - 0 Assessment of Community assistance (transportation, D/C planning, etc.) []  - 0 Additional assistance / Altered mentation []  - 0 Support  Surface(s) Assessment (bed, cushion, seat, etc.) INTERVENTIONS - Wound Cleansing / Measurement X - Wound Imaging (photographs - any number of wounds) 1 5 []  - 0 Wound Tracing (instead of photographs) X- 1 5 Simple Wound Measurement - one wound []  - 0 Complex Wound Measurement - multiple wounds X- 1 5  Simple Wound Cleansing - one wound []  - 0 Complex Wound Cleansing - multiple wounds INTERVENTIONS - Wound Dressings X - Small Wound Dressing one or multiple wounds 1 10 []  - 0 Medium Wound Dressing one or multiple wounds []  - 0 Large Wound Dressing one or multiple wounds []  - 0 Application of Medications - injection INTERVENTIONS - Miscellaneous []  - External ear exam 0 []  - 0 Specimen Collection (cultures, biopsies, blood, body fluids, etc.) []  - 0 Specimen(s) / Culture(s) sent or taken to Lab for analysis []  - 0 Patient Transfer (multiple staff / Civil Service fast streamer / Similar devices) []  - 0 Simple Staple / Suture removal (25 or less) []  - 0 Complex Staple / Suture removal (26 or more) []  - 0 Hypo / Hyperglycemic Management (close monitor of Blood Glucose) X- 1 15 Ankle / Brachial Index (ABI) - do not check if billed separately Has the patient been seen at the hospital within the last three years: Yes Total Score: 140 Level Of Care: New/Established - Level 4 Electronic Signature(s) Signed: 04/26/2018 5:05:24 PM By: Montey Hora Entered By: Montey Hora on 04/26/2018 13:18:25 Dorothy [name] ([id number]) -------------------------------------------------------------------------------- Encounter Discharge Information Details Patient Name: Dorothy [name]. Date of Service: 04/26/2018 9:45 AM Medical Record Number: [id number] Patient Account Number: [id number] Date of Birth/Sex: 1934-12-09 (83 y.o. F) Treating RN: Cornell Barman Primary Care Lanisa Ishler: Rutherford Guys Other Clinician: Referring Dexter Sauser: Rutherford Guys Treating Cedrick Partain/Extender: Melburn Hake, HOYT Weeks in Treatment:  0 Encounter Discharge Information Items Discharge Condition: Stable Ambulatory Status: Ambulatory Discharge Destination: Home Transportation: Private Auto Accompanied By: granddaughter Schedule Follow-up Appointment: Yes Clinical Summary of Care: Electronic Signature(s) Signed: 04/26/2018 6:19:42 PM By: Gretta Cool, BSN, RN, CWS, Kim RN, BSN Entered By: Gretta Cool, BSN, RN, CWS, Kim on 04/26/2018 11:21:21 Dorothy [name] ([id number]) -------------------------------------------------------------------------------- Lower Extremity Assessment Details Patient Name: Dorothy [name]. Date of Service: 04/26/2018 9:45 AM Medical Record Number: [id number] Patient Account Number: [id number] Date of Birth/Sex: 1934-10-25 (83 y.o. F) Treating RN: Roger Shelter Primary Care Zahava Quant: Rutherford Guys Other Clinician: Referring Eames Dibiasio: Rutherford Guys Treating Cyrstal Leitz/Extender: STONE III, HOYT Weeks in Treatment: 0 Edema Assessment Assessed: [Left: No] [Right: No] Edema: [Left: Ye] [Right: s] Calf Left: Right: Point of Measurement: 32 cm From Medial Instep 41.5 cm cm Ankle Left: Right: Point of Measurement: 10 cm From Medial Instep 22 cm cm Vascular Assessment Claudication: Claudication Assessment [Left:None] Pulses: Dorsalis Pedis Palpable: [Left:No] Doppler Audible: [Left:Yes] Posterior Tibial Palpable: [Left:No] Doppler Audible: [Left:Yes] Extremity colors, hair growth, and conditions: Extremity Color: [Left:Hyperpigmented] Hair Growth on Extremity: [Left:Yes] Temperature of Extremity: [Left:Warm] Capillary Refill: [Left:> 3 seconds] Toe Nail Assessment Left: Right: Thick: Yes Discolored: Yes Deformed: No Improper Length and Hygiene: No Notes Marion BIlateral >220 Electronic Signature(s) Signed: 04/26/2018 4:21:17 PM By: Roger Shelter Entered By: Roger Shelter on 04/26/2018 10:37:17 Dorothy [name] ([id number]) Dorothy [name], [name]  ([id number]) -------------------------------------------------------------------------------- Multi Wound Chart Details Patient Name: Dorothy [name]. Date of Service: 04/26/2018 9:45 AM Medical Record Number: [id number] Patient Account Number: [id number] Date of Birth/Sex: 10/03/1934 (83 y.o. F) Treating RN: Montey Hora Primary Care Rosabella Edgin: Rutherford Guys Other Clinician: Referring Raini Tiley: Rutherford Guys Treating Zacharie Portner/Extender: STONE III, HOYT Weeks in Treatment: 0 Vital Signs Height(in): 64 Pulse(bpm): 82 Weight(lbs): 173.4 Blood Pressure(mmHg): 185/78 Body Mass Index(BMI): 30 Temperature(F): 97.8 Respiratory Rate 18 (breaths/min): Photos: [1:No Photos] [N/A:N/A] Wound Location: [1:Left Lower Leg - Medial] [N/A:N/A] Wounding Event: [1:Gradually Appeared] [N/A:N/A] Primary Etiology: [1:Diabetic Wound/Ulcer of the Lower Extremity] [N/A:N/A] Comorbid History: [1:Cataracts, Anemia, Chronic Obstructive  Pulmonary Disease (COPD), Arrhythmia, Congestive Heart Failure, Hypertension, Type II Diabetes, Osteoarthritis, Confinement Anxiety] [N/A:N/A] Date Acquired: [1:04/26/2016] [N/A:N/A] Weeks of Treatment: [1:0] [N/A:N/A] Wound Status: [1:Open] [N/A:N/A] Measurements L x W x D [1:1.5x1.5x0.1] [N/A:N/A] (cm) Area (cm) : [1:1.767] [N/A:N/A] Volume (cm) : [1:0.177] [N/A:N/A] Classification: [1:Grade 2] [N/A:N/A] Exudate Amount: [1:Small] [N/A:N/A] Exudate Type: [1:Serous] [N/A:N/A] Exudate Color: [1:amber] [N/A:N/A] Wound Margin: [1:Indistinct, nonvisible] [N/A:N/A] Granulation Amount: [1:None Present (0%)] [N/A:N/A] Necrotic Amount: [1:Large (67-100%)] [N/A:N/A] Necrotic Tissue: [1:Eschar] [N/A:N/A] Exposed Structures: [1:Fascia: No Fat Layer (Subcutaneous Tissue) Exposed: No Tendon: No Muscle: No Joint: No Bone: No] [N/A:N/A] Epithelialization: [1:None] [N/A:N/A] Periwound Skin Texture: [1:Excoriation: No Induration: No] [N/A:N/A] Callus: No Crepitus: No Rash:  No Scarring: No Periwound Skin Moisture: Dry/Scaly: Yes N/A N/A Maceration: No Periwound Skin Color: Atrophie Blanche: No N/A N/A Cyanosis: No Ecchymosis: No Erythema: No Hemosiderin Staining: No Mottled: No Pallor: No Rubor: No Tenderness on Palpation: No N/A N/A Wound Preparation: Ulcer Cleansing: N/A N/A Rinsed/Irrigated with Saline Topical Anesthetic Applied: Other: lidocaine 4% Treatment Notes Electronic Signature(s) Signed: 04/26/2018 5:05:24 PM By: Montey Hora Entered By: Montey Hora on 04/26/2018 11:02:10 Dorothy [name] ([id number]) -------------------------------------------------------------------------------- [place] Details Patient Name: Dorothy [name]. Date of Service: 04/26/2018 9:45 AM Medical Record Number: [id number] Patient Account Number: [id number] Date of Birth/Sex: [birth year] (83 y.o. F) Treating RN: Montey Hora Primary Care Holston Oyama: Rutherford Guys Other Clinician: Referring Michal Callicott: Rutherford Guys Treating Josejulian Tarango/Extender: Melburn Hake, HOYT Weeks in Treatment: 0 Active Inactive ` Abuse / Safety / Falls / Self Care Management Nursing Diagnoses: Impaired physical mobility Goals: Patient will remain injury free related to falls Date Initiated: 04/26/2018 Target Resolution Date: 07/12/2018 Goal Status: Active Interventions: Assess fall risk on admission and as needed Notes: ` Orientation to the Wound Care Program Nursing Diagnoses: Knowledge deficit related to the wound healing center program Goals: Patient/caregiver will verbalize understanding of the [place] Date Initiated: 04/26/2018 Target Resolution Date: 07/12/2018 Goal Status: Active Interventions: Provide education on orientation to the wound center Notes: ` Pain, Acute or Chronic Nursing Diagnoses: Pain, acute or chronic: actual or potential Goals: Patient will verbalize adequate pain control and receive pain control  interventions during procedures as needed Date Initiated: 04/26/2018 Target Resolution Date: 07/12/2018 Goal Status: Active Interventions: SIMONA, ROCQUE ([id number]) Assess comfort goal upon admission Notes: ` Wound/Skin Impairment Nursing Diagnoses: Impaired tissue integrity Goals: Ulcer/skin breakdown will heal within 14 weeks Date Initiated: 04/26/2018 Target Resolution Date: 07/12/2018 Goal Status: Active Interventions: Assess patient/caregiver ability to obtain necessary supplies Assess patient/caregiver ability to perform ulcer/skin care regimen upon admission and as needed Assess ulceration(s) every visit Notes: Electronic Signature(s) Signed: 04/26/2018 5:05:24 PM By: Montey Hora Entered By: Montey Hora on 04/26/2018 11:01:58 Dorothy [name] ([id number]) -------------------------------------------------------------------------------- Pain Assessment Details Patient Name: Dorothy [name]. Date of Service: 04/26/2018 9:45 AM Medical Record Number: [id number] Patient Account Number: [id number] Date of Birth/Sex: 12-09-1934 (83 y.o. F) Treating RN: Roger Shelter Primary Care Carra Brindley: Rutherford Guys Other Clinician: Referring Josaphine Shimamoto: Rutherford Guys Treating Sonna Lipsky/Extender: STONE III, HOYT Weeks in Treatment: 0 Active Problems Location of Pain Severity and Description of Pain Patient Has Paino Yes Site Locations Duration of the Pain. Constant / Intermittento Constant Rate the pain. Current Pain Level: 5 Character of Pain Describe the Pain: Throbbing Pain Management and Medication Current Pain Management: Electronic Signature(s) Signed: 04/26/2018 4:21:17 PM By: Roger Shelter Entered By: Roger Shelter on 04/26/2018 10:06:48 Dorothy [name] ([id number]) -------------------------------------------------------------------------------- Wound Assessment Details Patient Name: Dorothy [name]. Date  of Service: 04/26/2018 9:45 AM Medical Record Number:  [id number] Patient Account Number: [id number] Date of Birth/Sex: 06-03-1935 (83 y.o. F) Treating RN: Roger Shelter Primary Care Joanna Borawski: Rutherford Guys Other Clinician: Referring Suzanne Garbers: Rutherford Guys Treating Izak Anding/Extender: STONE III, HOYT Weeks in Treatment: 0 Wound Status Wound Number: 1 Primary Diabetic Wound/Ulcer of the Lower Extremity Etiology: Wound Location: Left Lower Leg - Medial Wound Open Wounding Event: Gradually Appeared Status: Date Acquired: 04/26/2016 Comorbid Cataracts, Anemia, Chronic Obstructive Weeks Of Treatment: 0 History: Pulmonary Disease (COPD), Arrhythmia, Clustered Wound: No Congestive Heart Failure, Hypertension, Type II Diabetes, Osteoarthritis, Confinement Anxiety Photos Wound Measurements Length: (cm) 1.5 % Reduction Width: (cm) 1.5 % Reduction Depth: (cm) 0.1 Epithelializ Area: (cm) 1.767 Tunneling: Volume: (cm) 0.177 Undermining in Area: 0% in Volume: 0% ation: None No : No Wound Description Classification: Grade 2 Foul Odor A Wound Margin: Indistinct, nonvisible Slough/Fibr Exudate Amount: Medium Exudate Type: Serous Exudate Color: amber fter Cleansing: No ino Yes Wound Bed Granulation Amount: Medium (34-66%) Exposed Structure Granulation Quality: Red Fascia Exposed: No Necrotic Amount: Medium (34-66%) Fat Layer (Subcutaneous Tissue) Exposed: No Necrotic Quality: Eschar Tendon Exposed: No Muscle Exposed: No Joint Exposed: No Bone Exposed: No Periwound Skin Texture Cedillos, Alegandra F. ([id number]) Texture Color No Abnormalities Noted: No No Abnormalities Noted: No Callus: No Atrophie Blanche: No Crepitus: No Cyanosis: No Excoriation: No Ecchymosis: No Induration: No Erythema: No Rash: No Hemosiderin Staining: No Scarring: No Mottled: No Pallor: No Moisture Rubor: No No Abnormalities Noted: No Dry / Scaly: Yes Maceration: No Wound Preparation Ulcer Cleansing: Rinsed/Irrigated with Saline Topical  Anesthetic Applied: Other: lidocaine 4%, Treatment Notes Wound #1 (Left, Medial Lower Leg) 1. Cleansed with: Clean wound with Normal Saline 2. Anesthetic Topical Lidocaine 4% cream to wound bed prior to debridement 4. Dressing Applied: Prisma Ag 5. Secondary Dressing Applied Bordered Foam Dressing Notes saline Electronic Signature(s) Signed: 04/26/2018 4:21:17 PM By: Roger Shelter Entered By: Roger Shelter on 04/26/2018 12:00:46 Dorothy [name] ([id number]) -------------------------------------------------------------------------------- Vitals Details Patient Name: Dorothy [name]. Date of Service: 04/26/2018 9:45 AM Medical Record Number: [id number] Patient Account Number: [id number] Date of Birth/Sex: 1935/08/05 (83 y.o. F) Treating RN: Roger Shelter Primary Care Ansh Fauble: Rutherford Guys Other Clinician: Referring Lloyd Cullinan: Rutherford Guys Treating Shimika Ames/Extender: STONE III, HOYT Weeks in Treatment: 0 Vital Signs Time Taken: 10:07 Temperature (F): 97.8 Height (in): 64 Pulse (bpm): 82 Source: Stated Respiratory Rate (breaths/min): 18 Weight (lbs): 173.4 Blood Pressure (mmHg): 185/78 Source: Measured Reference Range: 80 - 120 mg / dl Body Mass Index (BMI): 29.8 Electronic Signature(s) Signed: 04/26/2018 4:21:17 PM By: Roger Shelter Entered By: Roger Shelter on 04/26/2018 10:08:47

## 2018-05-10 ENCOUNTER — Ambulatory Visit: Payer: Medicare Other | Admitting: Physician Assistant

## 2018-05-17 ENCOUNTER — Ambulatory Visit
Admission: RE | Admit: 2018-05-17 | Discharge: 2018-05-17 | Disposition: A | Discharge status: Home / Self Care | Payer: Medicare Other | Source: Ambulatory Visit | Attending: Physician Assistant | Admitting: Physician Assistant

## 2018-05-17 ENCOUNTER — Encounter: Payer: Medicare Other | Attending: Physician Assistant | Admitting: Physician Assistant

## 2018-05-17 DIAGNOSIS — F419 Anxiety disorder, unspecified: Secondary | ICD-10-CM | POA: Diagnosis not present

## 2018-05-17 DIAGNOSIS — L97822 Non-pressure chronic ulcer of other part of left lower leg with fat layer exposed: Secondary | ICD-10-CM | POA: Diagnosis not present

## 2018-05-17 DIAGNOSIS — I10 Essential (primary) hypertension: Secondary | ICD-10-CM | POA: Insufficient documentation

## 2018-05-17 DIAGNOSIS — I4891 Unspecified atrial fibrillation: Secondary | ICD-10-CM | POA: Insufficient documentation

## 2018-05-17 DIAGNOSIS — R52 Pain, unspecified: Secondary | ICD-10-CM

## 2018-05-17 DIAGNOSIS — I890 Lymphedema, not elsewhere classified: Secondary | ICD-10-CM | POA: Insufficient documentation

## 2018-05-17 DIAGNOSIS — I482 Chronic atrial fibrillation: Secondary | ICD-10-CM | POA: Insufficient documentation

## 2018-05-17 DIAGNOSIS — T8189XA Other complications of procedures, not elsewhere classified, initial encounter: Secondary | ICD-10-CM

## 2018-05-17 DIAGNOSIS — I110 Hypertensive heart disease with heart failure: Secondary | ICD-10-CM | POA: Diagnosis not present

## 2018-05-17 DIAGNOSIS — M1990 Unspecified osteoarthritis, unspecified site: Secondary | ICD-10-CM | POA: Insufficient documentation

## 2018-05-17 DIAGNOSIS — I509 Heart failure, unspecified: Secondary | ICD-10-CM | POA: Insufficient documentation

## 2018-05-17 DIAGNOSIS — E11622 Type 2 diabetes mellitus with other skin ulcer: Secondary | ICD-10-CM | POA: Insufficient documentation

## 2018-05-17 DIAGNOSIS — Z8249 Family history of ischemic heart disease and other diseases of the circulatory system: Secondary | ICD-10-CM | POA: Insufficient documentation

## 2018-05-17 DIAGNOSIS — J449 Chronic obstructive pulmonary disease, unspecified: Secondary | ICD-10-CM | POA: Insufficient documentation

## 2018-05-19 NOTE — Progress Notes (Signed)
MILLENIA, WALDVOGEL ([id number]) Visit Report for 05/17/2018 Arrival Information Details Patient Name: Dorothy [name], Dorothy [name]. Date of Service: 05/17/2018 2:30 PM Medical Record Number: [id number] Patient Account Number: [id number] Date of Birth/Sex: 1934/10/24 (83 y.o. F) Treating RN: Montey Hora Primary Care Breckan Cafiero: Rutherford Guys Other Clinician: Referring Darrel Gloss: Rutherford Guys Treating Armenta Erskin/Extender: Melburn Hake, HOYT Weeks in Treatment: 3 Visit Information History Since Last Visit Added or deleted any medications: No Patient Arrived: Walker Any new allergies or adverse reactions: No Arrival Time: 14:46 Had a fall or experienced change in No Accompanied By: granddaughter activities of daily living that may affect Transfer Assistance: None risk of falls: Patient Identification Verified: Yes Signs or symptoms of abuse/neglect since last visito No Secondary Verification Process Yes Hospitalized since last visit: No Completed: Implantable device outside of the clinic excluding No Patient Has Alerts: Yes cellular tissue based products placed in the center Patient Alerts: Patient on Blood since last visit: Thinner Has Dressing in Place as Prescribed: Yes Eliquis Pain Present Now: No DMII Electronic Signature(s) Signed: 05/17/2018 5:00:18 PM By: Lorine Bears RCP, RRT, CHT Entered By: Lorine Bears on 05/17/2018 14:50:22 Dorothy [name] ([id number]) -------------------------------------------------------------------------------- Clinic Level of Care Assessment Details Patient Name: Dorothy [name]. Date of Service: 05/17/2018 2:30 PM Medical Record Number: [id number] Patient Account Number: [id number] Date of Birth/Sex: [birth year] (83 y.o. F) Treating RN: Montey Hora Primary Care Tawan Degroote: Rutherford Guys Other Clinician: Referring Parks Czajkowski: Rutherford Guys Treating Lavene Penagos/Extender: Melburn Hake, HOYT Weeks in Treatment: 3 Clinic Level of Care Assessment  Items TOOL 4 Quantity Score []  - Use when only an EandM is performed on FOLLOW-UP visit 0 ASSESSMENTS - Nursing Assessment / Reassessment X - Reassessment of Co-morbidities (includes updates in patient status) 1 10 X- 1 5 Reassessment of Adherence to Treatment Plan ASSESSMENTS - Wound and Skin Assessment / Reassessment X - Simple Wound Assessment / Reassessment - one wound 1 5 []  - 0 Complex Wound Assessment / Reassessment - multiple wounds []  - 0 Dermatologic / Skin Assessment (not related to wound area) ASSESSMENTS - Focused Assessment X - Circumferential Edema Measurements - multi extremities 1 5 []  - 0 Nutritional Assessment / Counseling / Intervention X- 1 5 Lower Extremity Assessment (monofilament, tuning fork, pulses) []  - 0 Peripheral Arterial Disease Assessment (using hand held doppler) ASSESSMENTS - Ostomy and/or Continence Assessment and Care []  - Incontinence Assessment and Management 0 []  - 0 Ostomy Care Assessment and Management (repouching, etc.) PROCESS - Coordination of Care X - Simple Patient / Family Education for ongoing care 1 15 []  - 0 Complex (extensive) Patient / Family Education for ongoing care X- 1 10 Staff obtains Programmer, systems, Records, Test Results / Process Orders []  - 0 Staff telephones HHA, Nursing Homes / Clarify orders / etc []  - 0 Routine Transfer to another Facility (non-emergent condition) []  - 0 Routine Hospital Admission (non-emergent condition) []  - 0 New Admissions / Biomedical engineer / Ordering NPWT, Apligraf, etc. []  - 0 Emergency Hospital Admission (emergent condition) X- 1 10 Simple Discharge Coordination Dorothy [name], HAGGER. ([id number]) []  - 0 Complex (extensive) Discharge Coordination PROCESS - Special Needs []  - Pediatric / Minor Patient Management 0 []  - 0 Isolation Patient Management []  - 0 Hearing / Language / Visual special needs []  - 0 Assessment of Community assistance (transportation, D/C planning, etc.) []  -  0 Additional assistance / Altered mentation []  - 0 Support Surface(s) Assessment (bed, cushion, seat, etc.) INTERVENTIONS - Wound Cleansing / Measurement X - Simple Wound Cleansing -  one wound 1 5 []  - 0 Complex Wound Cleansing - multiple wounds X- 1 5 Wound Imaging (photographs - any number of wounds) []  - 0 Wound Tracing (instead of photographs) X- 1 5 Simple Wound Measurement - one wound []  - 0 Complex Wound Measurement - multiple wounds INTERVENTIONS - Wound Dressings X - Small Wound Dressing one or multiple wounds 1 10 []  - 0 Medium Wound Dressing one or multiple wounds []  - 0 Large Wound Dressing one or multiple wounds []  - 0 Application of Medications - topical []  - 0 Application of Medications - injection INTERVENTIONS - Miscellaneous []  - External ear exam 0 []  - 0 Specimen Collection (cultures, biopsies, blood, body fluids, etc.) []  - 0 Specimen(s) / Culture(s) sent or taken to Lab for analysis []  - 0 Patient Transfer (multiple staff / Civil Service fast streamer / Similar devices) []  - 0 Simple Staple / Suture removal (25 or less) []  - 0 Complex Staple / Suture removal (26 or more) []  - 0 Hypo / Hyperglycemic Management (close monitor of Blood Glucose) []  - 0 Ankle / Brachial Index (ABI) - do not check if billed separately X- 1 5 Vital Signs Dorothy [name], [name] ([id number]) Has the patient been seen at the hospital within the last three years: Yes Total Score: 95 Level Of Care: New/Established - Level 3 Electronic Signature(s) Signed: 05/17/2018 5:28:07 PM By: Montey Hora Entered By: Montey Hora on 05/17/2018 15:33:06 Dorothy [name] ([id number]) -------------------------------------------------------------------------------- Encounter Discharge Information Details Patient Name: Dorothy [name]. Date of Service: 05/17/2018 2:30 PM Medical Record Number: [id number] Patient Account Number: [id number] Date of Birth/Sex: Jan 21, 1935 (83 y.o. F) Treating RN: Cornell Barman Primary Care Aliece Honold: Rutherford Guys Other Clinician: Referring Bubba Vanbenschoten: Rutherford Guys Treating Lynae Pederson/Extender: Melburn Hake, HOYT Weeks in Treatment: 3 Encounter Discharge Information Items Discharge Condition: Stable Ambulatory Status: Walker Discharge Destination: Home Transportation: Private Auto Schedule Follow-up Appointment: Yes Clinical Summary of Care: Electronic Signature(s) Signed: 05/17/2018 5:09:12 PM By: Gretta Cool, BSN, RN, CWS, Kim RN, BSN Entered By: Gretta Cool, BSN, RN, CWS, Kim on 05/17/2018 15:41:18 Dorothy [name] ([id number]) -------------------------------------------------------------------------------- Lower Extremity Assessment Details Patient Name: Dorothy [name], Dorothy [name]. Date of Service: 05/17/2018 2:30 PM Medical Record Number: [id number] Patient Account Number: [id number] Date of Birth/Sex: 28-Apr-1935 (83 y.o. F) Treating RN: Secundino Ginger Primary Care Sreya Froio: Rutherford Guys Other Clinician: Referring Rondia Higginbotham: Rutherford Guys Treating Hung Rhinesmith/Extender: Melburn Hake, HOYT Weeks in Treatment: 3 Edema Assessment Assessed: [Left: No] [Right: No] Edema: [Left: N] [Right: o] Calf Left: Right: Point of Measurement: 32 cm From Medial Instep 39.5 cm cm Ankle Left: Right: Point of Measurement: 10 cm From Medial Instep 19.5 cm cm Vascular Assessment Claudication: Claudication Assessment [Left:None] Pulses: Dorsalis Pedis Palpable: [Left:Yes] Posterior Tibial Extremity colors, hair growth, and conditions: Extremity Color: [Left:Normal] Hair Growth on Extremity: [Left:Yes] Temperature of Extremity: [Left:Cool] Capillary Refill: [Left:< 3 seconds] Toe Nail Assessment Left: Right: Thick: No Discolored: No Deformed: No Improper Length and Hygiene: No Electronic Signature(s) Signed: 05/17/2018 4:32:43 PM By: Secundino Ginger Entered By: Secundino Ginger on 05/17/2018 15:03:50 Dorothy [name]  ([id number]) -------------------------------------------------------------------------------- Multi Wound Chart Details Patient Name: Dorothy [name]. Date of Service: 05/17/2018 2:30 PM Medical Record Number: [id number] Patient Account Number: [id number] Date of Birth/Sex: Apr 16, 1935 (83 y.o. F) Treating RN: Montey Hora Primary Care Jessilyn Catino: Rutherford Guys Other Clinician: Referring Tyeisha Dinan: Rutherford Guys Treating Jaidin Ugarte/Extender: STONE III, HOYT Weeks in Treatment: 3 Vital Signs Height(in): 64 Pulse(bpm): 72 Weight(lbs): 173.4 Blood Pressure(mmHg): 134/73 Body Mass Index(BMI): 30 Temperature(F): 98.3 Respiratory Rate 18 (  breaths/min): Photos: [N/A:N/A] Wound Location: Left Lower Leg - Medial N/A N/A Wounding Event: Gradually Appeared N/A N/A Primary Etiology: Diabetic Wound/Ulcer of the N/A N/A Lower Extremity Comorbid History: Cataracts, Anemia, Chronic N/A N/A Obstructive Pulmonary Disease (COPD), Arrhythmia, Congestive Heart Failure, Hypertension, Type II Diabetes, Osteoarthritis, Confinement Anxiety Date Acquired: 04/26/2016 N/A N/A Weeks of Treatment: 3 N/A N/A Wound Status: Open N/A N/A Measurements L x W x D 0.8x0.8x0.1 N/A N/A (cm) Area (cm) : 0.503 N/A N/A Volume (cm) : 0.05 N/A N/A % Reduction in Area: 71.50% N/A N/A % Reduction in Volume: 71.80% N/A N/A Classification: Grade 2 N/A N/A Exudate Amount: Small N/A N/A Exudate Type: Serous N/A N/A Exudate Color: amber N/A N/A Wound Margin: Indistinct, nonvisible N/A N/A Granulation Amount: Small (1-33%) N/A N/A Granulation Quality: Red N/A N/A Necrotic Amount: Medium (34-66%) N/A N/A Exposed Structures: N/A N/A Dorothy [name], HOWATT. ([id number]) Fascia: No Fat Layer (Subcutaneous Tissue) Exposed: No Tendon: No Muscle: No Joint: No Bone: No Epithelialization: None N/A N/A Periwound Skin Texture: Excoriation: No N/A N/A Induration: No Callus: No Crepitus: No Rash: No Scarring: No Periwound  Skin Moisture: Dry/Scaly: Yes N/A N/A Maceration: No Periwound Skin Color: Atrophie Blanche: No N/A N/A Cyanosis: No Ecchymosis: No Erythema: No Hemosiderin Staining: No Mottled: No Pallor: No Rubor: No Tenderness on Palpation: No N/A N/A Wound Preparation: Ulcer Cleansing: N/A N/A Rinsed/Irrigated with Saline Topical Anesthetic Applied: Other: lidocaine 4% Treatment Notes Electronic Signature(s) Signed: 05/17/2018 5:28:07 PM By: Montey Hora Entered By: Montey Hora on 05/17/2018 15:30:26 Dorothy [name] ([id number]) -------------------------------------------------------------------------------- [place] Details Patient Name: Dorothy [name]. Date of Service: 05/17/2018 2:30 PM Medical Record Number: [id number] Patient Account Number: [id number] Date of Birth/Sex: 1934/10/02 (83 y.o. F) Treating RN: Montey Hora Primary Care Ceaser Ebeling: Rutherford Guys Other Clinician: Referring Alleyne Lac: Rutherford Guys Treating Renesmee Raine/Extender: Melburn Hake, HOYT Weeks in Treatment: 3 Active Inactive ` Abuse / Safety / Falls / Self Care Management Nursing Diagnoses: Impaired physical mobility Goals: Patient will remain injury free related to falls Date Initiated: 04/26/2018 Target Resolution Date: 07/12/2018 Goal Status: Active Interventions: Assess fall risk on admission and as needed Notes: ` Orientation to the Wound Care Program Nursing Diagnoses: Knowledge deficit related to the wound healing center program Goals: Patient/caregiver will verbalize understanding of the [place] Date Initiated: 04/26/2018 Target Resolution Date: 07/12/2018 Goal Status: Active Interventions: Provide education on orientation to the wound center Notes: ` Pain, Acute or Chronic Nursing Diagnoses: Pain, acute or chronic: actual or potential Goals: Patient will verbalize adequate pain control and receive pain control interventions during procedures as  needed Date Initiated: 04/26/2018 Target Resolution Date: 07/12/2018 Goal Status: Active Interventions: Dorothy [name], Dorothy [name] ([id number]) Assess comfort goal upon admission Notes: ` Wound/Skin Impairment Nursing Diagnoses: Impaired tissue integrity Goals: Ulcer/skin breakdown will heal within 14 weeks Date Initiated: 04/26/2018 Target Resolution Date: 07/12/2018 Goal Status: Active Interventions: Assess patient/caregiver ability to obtain necessary supplies Assess patient/caregiver ability to perform ulcer/skin care regimen upon admission and as needed Assess ulceration(s) every visit Notes: Electronic Signature(s) Signed: 05/17/2018 5:28:07 PM By: Montey Hora Entered By: Montey Hora on 05/17/2018 15:30:20 Dorothy [name] ([id number]) -------------------------------------------------------------------------------- Pain Assessment Details Patient Name: Dorothy [name]. Date of Service: 05/17/2018 2:30 PM Medical Record Number: [id number] Patient Account Number: [id number] Date of Birth/Sex: 12-04-1934 (83 y.o. F) Treating RN: Montey Hora Primary Care Merik Mignano: Rutherford Guys Other Clinician: Referring Cadi Rhinehart: Rutherford Guys Treating Francheska Villeda/Extender: STONE III, HOYT Weeks in Treatment: 3 Active Problems Location of Pain Severity  and Description of Pain Patient Has Paino No Site Locations Pain Management and Medication Current Pain Management: Electronic Signature(s) Signed: 05/17/2018 5:00:18 PM By: Lorine Bears RCP, RRT, CHT Signed: 05/17/2018 5:28:07 PM By: Montey Hora Entered By: Lorine Bears on 05/17/2018 14:50:28 Dorothy [name] ([id number]) -------------------------------------------------------------------------------- Patient/Caregiver Education Details Patient Name: Dorothy [name]. Date of Service: 05/17/2018 2:30 PM Medical Record Number: [id number] Patient Account Number: [id number] Date of Birth/Gender: [birth year] (83 y.o.  F) Treating RN: Cornell Barman Primary Care Physician: Rutherford Guys Other Clinician: Referring Physician: Rutherford Guys Treating Physician/Extender: Sharalyn Ink in Treatment: 3 Education Assessment Education Provided To: Patient Education Topics Provided Wound/Skin Impairment: Handouts: Caring for Your Ulcer Methods: Demonstration, Explain/Verbal Responses: State content correctly Electronic Signature(s) Signed: 05/17/2018 5:09:12 PM By: Gretta Cool, BSN, RN, CWS, Kim RN, BSN Entered By: Gretta Cool, BSN, RN, CWS, Kim on 05/17/2018 15:41:27 Dorothy [name] ([id number]) -------------------------------------------------------------------------------- Wound Assessment Details Patient Name: Dorothy [name]. Date of Service: 05/17/2018 2:30 PM Medical Record Number: [id number] Patient Account Number: [id number] Date of Birth/Sex: 12-Sep-1934 (83 y.o. F) Treating RN: Secundino Ginger Primary Care Shemekia Patane: Rutherford Guys Other Clinician: Referring Ronald Vinsant: Rutherford Guys Treating Lenord Fralix/Extender: STONE III, HOYT Weeks in Treatment: 3 Wound Status Wound Number: 1 Primary Diabetic Wound/Ulcer of the Lower Extremity Etiology: Wound Location: Left Lower Leg - Medial Wound Open Wounding Event: Gradually Appeared Status: Date Acquired: 04/26/2016 Comorbid Cataracts, Anemia, Chronic Obstructive Weeks Of Treatment: 3 History: Pulmonary Disease (COPD), Arrhythmia, Clustered Wound: No Congestive Heart Failure, Hypertension, Type II Diabetes, Osteoarthritis, Confinement Anxiety Photos Photo Uploaded By: Secundino Ginger on 05/17/2018 15:06:55 Wound Measurements Length: (cm) 0.8 % Reduction Width: (cm) 0.8 % Reduction Depth: (cm) 0.1 Epitheliali Area: (cm) 0.503 Tunneling: Volume: (cm) 0.05 Underminin in Area: 71.5% in Volume: 71.8% zation: None No g: No Wound Description Classification: Grade 2 Foul Odor A Wound Margin: Indistinct, nonvisible Slough/Fibr Exudate Amount: Small Exudate Type:  Serous Exudate Color: amber fter Cleansing: No ino No Wound Bed Granulation Amount: Small (1-33%) Exposed Structure Granulation Quality: Red Fascia Exposed: No Necrotic Amount: Medium (34-66%) Fat Layer (Subcutaneous Tissue) Exposed: No Necrotic Quality: Adherent Slough Tendon Exposed: No Muscle Exposed: No Joint Exposed: No Bone Exposed: No [name], [name] ([id number]) Periwound Skin Texture Texture Color No Abnormalities Noted: No No Abnormalities Noted: No Callus: No Atrophie Blanche: No Crepitus: No Cyanosis: No Excoriation: No Ecchymosis: No Induration: No Erythema: No Rash: No Hemosiderin Staining: No Scarring: No Mottled: No Pallor: No Moisture Rubor: No No Abnormalities Noted: No Dry / Scaly: Yes Maceration: No Wound Preparation Ulcer Cleansing: Rinsed/Irrigated with Saline Topical Anesthetic Applied: Other: lidocaine 4%, Treatment Notes Wound #1 (Left, Medial Lower Leg) 4. Dressing Applied: Prisma Ag 5. Secondary Dressing Applied Bordered Foam Dressing Notes saline Electronic Signature(s) Signed: 05/17/2018 4:32:43 PM By: Secundino Ginger Entered By: Secundino Ginger on 05/17/2018 15:00:19 Dorothy [name] ([id number]) -------------------------------------------------------------------------------- Vitals Details Patient Name: Dorothy [name]. Date of Service: 05/17/2018 2:30 PM Medical Record Number: [id number] Patient Account Number: [id number] Date of Birth/Sex: September 13, 1934 (83 y.o. F) Treating RN: Montey Hora Primary Care Josselyn Harkins: Rutherford Guys Other Clinician: Referring Adasyn Mcadams: Rutherford Guys Treating Poseidon Pam/Extender: Melburn Hake, HOYT Weeks in Treatment: 3 Vital Signs Time Taken: 14:50 Temperature (F): 98.3 Height (in): 64 Pulse (bpm): 72 Weight (lbs): 173.4 Respiratory Rate (breaths/min): 18 Body Mass Index (BMI): 29.8 Blood Pressure (mmHg): 134/73 Reference Range: 80 - 120 mg / dl Electronic Signature(s) Signed: 05/17/2018 5:00:18 PM By:  Becky Sax, Sallie RCP, RRT, CHT Entered By: Becky Sax,  Sallie on 05/17/2018 14:53:10

## 2018-05-21 NOTE — Progress Notes (Signed)
RASHEENA, TALMADGE ([id number]) Visit Report for 05/17/2018 Chief Complaint Document Details Patient Name: CIEARRA, RUFO. Date of Service: 05/17/2018 2:30 PM Medical Record Number: [id number] Patient Account Number: [id number] Date of Birth/Sex: 1935-06-24 (83 y.o. F) Treating RN: Montey Hora Primary Care Provider: Rutherford Guys Other Clinician: Referring Provider: Rutherford Guys Treating Provider/Extender: Melburn Hake, Johnatan Baskette Weeks in Treatment: 3 Information Obtained from: Patient Chief Complaint Left medial lower extremity ulcer Electronic Signature(s) Signed: 05/19/2018 9:18:35 AM By: Worthy Keeler PA-C Entered By: Worthy Keeler on 05/17/2018 15:08:19 [name] ([id number]) -------------------------------------------------------------------------------- HPI Details Patient Name: [name]. Date of Service: 05/17/2018 2:30 PM Medical Record Number: [id number] Patient Account Number: [id number] Date of Birth/Sex: 05/10/1935 (83 y.o. F) Treating RN: Montey Hora Primary Care Provider: Rutherford Guys Other Clinician: Referring Provider: Rutherford Guys Treating Provider/Extender: Melburn Hake, Dwaine Pringle Weeks in Treatment: 3 History of Present Illness HPI Description: 04/26/18 on evaluation today patient presents for initial evaluation and clinic is referral from the [facility] clinic. She has been experiencing a wound on her left anterior lower extremity which has been intermittently open over the past two years. This is a site where she previously did have a biopsy performed which showed some type of cancer and subsequently she ended up having a larger surgical excision to completely remove everything. It actually appears based on a scar area that may have been more medial to where we're looking at right now. With that being said she does definitely have lymphedema and I do believe that this may be more of a lymphedema lesion as compared to truly be in a cancerous lesion. Nonetheless  I explained to the patient as well is her family member who was with her today that I'll be see I cannot be completely certain of that without biopsy. No other diagnostic imaging has been performed at this point. Patient does have diabetes her hemoglobin A1c most recent was 7.4% and this was on Jan 15, 2018. Upon further review of her labs from this date as well she also had an elevated creatinine and a glomerular filtration rate of 40 which was obviously low. She also did have a wound culture which was performed on 03/27/18. This revealed Staphylococcus aureus and the patient was originally given Keflex but then changed to a stronger antibiotic I'm unsure of what the antibiotic she was changed to was. Nonetheless that seems to have resolved the issue there does not appear to be any evidence of infection at this point. Her ABI's are noncompressible. No fevers, chills, nausea, or vomiting noted at this time. Other than the diabetes patient does have a history of lymphedema as well as chronic atrial fibrillation and COPD. 05/17/18 on evaluation today patient actually appears to be doing rather well in regard to her ulcer. In fact this has improved almost 50% since her last visit. This obviously is excellent news. With that being said I am not seeing at this point any evidence of infection which is also excellent news. She did have her arterial vascular studies. The impression was the patient did have normal [place] waveforms of the ankle bilaterally. However her right and left ABI's were noncompressible. The recommendation was should the patient feel conservative treatment consider MRA runoff to better define the site in nature of arterial occlusive disease and delineate treatment options. With that being said considering the fact that she seems to be doing better and is actually showing signs of making good progress I'm not even sure that a vascular  consulate is necessary at this point. The patient in  fact would like to avoid seeing any further doctors unless it becomes absolutely necessary. Electronic Signature(s) Signed: 05/19/2018 9:18:35 AM By: Worthy Keeler PA-C Entered By: Worthy Keeler on 05/18/2018 01:07:59 [name] ([id number]) -------------------------------------------------------------------------------- Physical Exam Details Patient Name: TERUKO, JOSWICK. Date of Service: 05/17/2018 2:30 PM Medical Record Number: [id number] Patient Account Number: [id number] Date of Birth/Sex: 1935/08/24 (83 y.o. F) Treating RN: Montey Hora Primary Care Provider: Rutherford Guys Other Clinician: Referring Provider: Rutherford Guys Treating Provider/Extender: STONE III, Kaedin Hicklin Weeks in Treatment: 3 Constitutional Well-nourished and well-hydrated in no acute distress. Respiratory normal breathing without difficulty. clear to auscultation bilaterally. Cardiovascular regular rate and rhythm with normal S1, S2. Psychiatric this patient is able to make decisions and demonstrates good insight into disease process. Alert and Oriented x 3. pleasant and cooperative. Notes On evaluation today patient's wound actually did not require any sharp debridement at this point check she seems to be doing excellent currently. I'm very pleased in her swelling though still increased does not appear to be quite as bad as last week. Overall I think she's making good progress. Electronic Signature(s) Signed: 05/19/2018 9:18:35 AM By: Worthy Keeler PA-C Entered By: Worthy Keeler on 05/18/2018 01:08:41 [name] ([id number]) -------------------------------------------------------------------------------- Physician Orders Details Patient Name: [name]. Date of Service: 05/17/2018 2:30 PM Medical Record Number: [id number] Patient Account Number: [id number] Date of Birth/Sex: Jan 03, 1935 (83 y.o. F) Treating RN: Montey Hora Primary Care Provider: Rutherford Guys Other Clinician: Referring  Provider: Rutherford Guys Treating Provider/Extender: Melburn Hake, Arnecia Ector Weeks in Treatment: 3 Verbal / Phone Orders: No Diagnosis Coding ICD-10 Coding Code Description I89.0 Lymphedema, not elsewhere classified E11.622 Type 2 diabetes mellitus with other skin ulcer L97.822 Non-pressure chronic ulcer of other part of left lower leg with fat layer exposed I10 Essential (primary) hypertension I48.2 Chronic atrial fibrillation J44.9 Chronic obstructive pulmonary disease, unspecified Wound Cleansing Wound #1 Left,Medial Lower Leg o Clean wound with Normal Saline. o May Shower, gently pat wound dry prior to applying new dressing. Anesthetic (add to Medication List) Wound #1 Left,Medial Lower Leg o Topical Lidocaine 4% cream applied to wound bed prior to debridement (In Clinic Only). Skin Barriers/Peri-Wound Care Wound #1 Left,Medial Lower Leg o Skin Prep Primary Wound Dressing Wound #1 Left,Medial Lower Leg o Silver Collagen - moistened with saline Secondary Dressing Wound #1 Left,Medial Lower Leg o Boardered Foam Dressing Dressing Change Frequency Wound #1 Left,Medial Lower Leg o Three times weekly Follow-up Appointments Wound #1 Left,Medial Lower Leg o Return Appointment in 2 weeks. Edema Control Wound #1 Left,Medial Lower Leg LACHLYN, VANDERSTELT. ([id number]) o Elevate legs to the level of the heart and pump ankles as often as possible Additional Orders / Instructions Wound #1 Left,Medial Lower Leg o Increase protein intake. o Activity as tolerated o Other: - try to keep blood sugars below 180 Electronic Signature(s) Signed: 05/17/2018 5:28:07 PM By: Montey Hora Signed: 05/19/2018 9:18:35 AM By: Worthy Keeler PA-C Entered By: Montey Hora on 05/17/2018 15:32:21 [name] ([id number]) -------------------------------------------------------------------------------- Problem List Details Patient Name: DEVORAH, GIVHAN. Date of Service: 05/17/2018  2:30 PM Medical Record Number: [id number] Patient Account Number: [id number] Date of Birth/Sex: Mar 12, 1935 (83 y.o. F) Treating RN: Montey Hora Primary Care Provider: Rutherford Guys Other Clinician: Referring Provider: Rutherford Guys Treating Provider/Extender: Melburn Hake, Yamari Ventola Weeks in Treatment: 3 Active Problems ICD-10 Evaluated Encounter Code Description Active Date Today Diagnosis I89.0 Lymphedema, not elsewhere classified 04/26/2018  No Yes E11.622 Type 2 diabetes mellitus with other skin ulcer 04/26/2018 No Yes L97.822 Non-pressure chronic ulcer of other part of left lower leg with 04/26/2018 No Yes fat layer exposed I10 Essential (primary) hypertension 04/26/2018 No Yes I48.2 Chronic atrial fibrillation 04/26/2018 No Yes J44.9 Chronic obstructive pulmonary disease, unspecified 04/26/2018 No Yes Inactive Problems Resolved Problems Electronic Signature(s) Signed: 05/19/2018 9:18:35 AM By: Worthy Keeler PA-C Entered By: Worthy Keeler on 05/17/2018 15:08:11 [name] ([id number]) -------------------------------------------------------------------------------- Progress Note Details Patient Name: [name]. Date of Service: 05/17/2018 2:30 PM Medical Record Number: [id number] Patient Account Number: [id number] Date of Birth/Sex: [birth year] (83 y.o. F) Treating RN: Montey Hora Primary Care Provider: Rutherford Guys Other Clinician: Referring Provider: Rutherford Guys Treating Provider/Extender: Melburn Hake, Daylyn Christine Weeks in Treatment: 3 Subjective Chief Complaint Information obtained from Patient Left medial lower extremity ulcer History of Present Illness (HPI) 04/26/18 on evaluation today patient presents for initial evaluation and clinic is referral from the [facility] clinic. She has been experiencing a wound on her left anterior lower extremity which has been intermittently open over the past two years. This is a site where she previously did have a biopsy performed which  showed some type of cancer and subsequently she ended up having a larger surgical excision to completely remove everything. It actually appears based on a scar area that may have been more medial to where we're looking at right now. With that being said she does definitely have lymphedema and I do believe that this may be more of a lymphedema lesion as compared to truly be in a cancerous lesion. Nonetheless I explained to the patient as well is her family member who was with her today that I'll be see I cannot be completely certain of that without biopsy. No other diagnostic imaging has been performed at this point. Patient does have diabetes her hemoglobin A1c most recent was 7.4% and this was on Jan 15, 2018. Upon further review of her labs from this date as well she also had an elevated creatinine and a glomerular filtration rate of 40 which was obviously low. She also did have a wound culture which was performed on 03/27/18. This revealed Staphylococcus aureus and the patient was originally given Keflex but then changed to a stronger antibiotic I'm unsure of what the antibiotic she was changed to was. Nonetheless that seems to have resolved the issue there does not appear to be any evidence of infection at this point. Her ABI's are noncompressible. No fevers, chills, nausea, or vomiting noted at this time. Other than the diabetes patient does have a history of lymphedema as well as chronic atrial fibrillation and COPD. 05/17/18 on evaluation today patient actually appears to be doing rather well in regard to her ulcer. In fact this has improved almost 50% since her last visit. This obviously is excellent news. With that being said I am not seeing at this point any evidence of infection which is also excellent news. She did have her arterial vascular studies. The impression was the patient did have normal [place] waveforms of the ankle bilaterally. However her right and left ABI's were  noncompressible. The recommendation was should the patient feel conservative treatment consider MRA runoff to better define the site in nature of arterial occlusive disease and delineate treatment options. With that being said considering the fact that she seems to be doing better and is actually showing signs of making good progress I'm not even sure that a vascular consulate  is necessary at this point. The patient in fact would like to avoid seeing any further doctors unless it becomes absolutely necessary. Patient History Information obtained from Patient. Family History Cancer - Father,Siblings, Diabetes - Maternal Grandparents, Heart Disease - Father, Hypertension - Mother,Father,Siblings, Lung Disease - Father,Siblings, Stroke - Father, No family history of Kidney Disease, Seizures, Thyroid Problems, Tuberculosis. Social History Never smoker, Marital Status - Widowed, Alcohol Use - Never, Drug Use - No History, Caffeine Use - Daily. Review of Systems (ROS) Constitutional Symptoms (General Health) Denies complaints or symptoms of Fever, Chills. Respiratory GIMENA, BUICK. ([id number]) The patient has no complaints or symptoms. Cardiovascular Complains or has symptoms of LE edema. Psychiatric The patient has no complaints or symptoms. Objective Constitutional Well-nourished and well-hydrated in no acute distress. Vitals Time Taken: 2:50 PM, Height: 64 in, Weight: 173.4 lbs, BMI: 29.8, Temperature: 98.3 F, Pulse: 72 bpm, Respiratory Rate: 18 breaths/min, Blood Pressure: 134/73 mmHg. Respiratory normal breathing without difficulty. clear to auscultation bilaterally. Cardiovascular regular rate and rhythm with normal S1, S2. Psychiatric this patient is able to make decisions and demonstrates good insight into disease process. Alert and Oriented x 3. pleasant and cooperative. General Notes: On evaluation today patient's wound actually did not require any sharp debridement at  this point check she seems to be doing excellent currently. I'm very pleased in her swelling though still increased does not appear to be quite as bad as last week. Overall I think she's making good progress. Integumentary (Hair, Skin) Wound #1 status is Open. Original cause of wound was Gradually Appeared. The wound is located on the Left,Medial Lower Leg. The wound measures 0.8cm length x 0.8cm width x 0.1cm depth; 0.503cm^2 area and 0.05cm^3 volume. There is no tunneling or undermining noted. There is a small amount of serous drainage noted. The wound margin is indistinct and nonvisible. There is small (1-33%) red granulation within the wound bed. There is a medium (34-66%) amount of necrotic tissue within the wound bed including Adherent Slough. The periwound skin appearance exhibited: Dry/Scaly. The periwound skin appearance did not exhibit: Callus, Crepitus, Excoriation, Induration, Rash, Scarring, Maceration, Atrophie Blanche, Cyanosis, Ecchymosis, Hemosiderin Staining, Mottled, Pallor, Rubor, Erythema. Assessment Active Problems ICD-10 Lymphedema, not elsewhere classified Type 2 diabetes mellitus with other skin ulcer Non-pressure chronic ulcer of other part of left lower leg with fat layer exposed Logan, Maniya F. ([id number]) Essential (primary) hypertension Chronic atrial fibrillation Chronic obstructive pulmonary disease, unspecified Plan Wound Cleansing: Wound #1 Left,Medial Lower Leg: Clean wound with Normal Saline. May Shower, gently pat wound dry prior to applying new dressing. Anesthetic (add to Medication List): Wound #1 Left,Medial Lower Leg: Topical Lidocaine 4% cream applied to wound bed prior to debridement (In Clinic Only). Skin Barriers/Peri-Wound Care: Wound #1 Left,Medial Lower Leg: Skin Prep Primary Wound Dressing: Wound #1 Left,Medial Lower Leg: Silver Collagen - moistened with saline Secondary Dressing: Wound #1 Left,Medial Lower Leg: Boardered Foam  Dressing Dressing Change Frequency: Wound #1 Left,Medial Lower Leg: Three times weekly Follow-up Appointments: Wound #1 Left,Medial Lower Leg: Return Appointment in 2 weeks. Edema Control: Wound #1 Left,Medial Lower Leg: Elevate legs to the level of the heart and pump ankles as often as possible Additional Orders / Instructions: Wound #1 Left,Medial Lower Leg: Increase protein intake. Activity as tolerated Other: - try to keep blood sugars below 180 I am going to suggest at this point that we actually continue with the above wound care measures for the next week. The patient has made excellent progress as  such and I'm recommending that continuation would likely be the best course of treatment. She definitely would not like to have to go back to see a vascular surgeon and less she absolutely has to. Therefore we will subsequently see her back for reevaluation depend on how things do over the next two weeks. Please see above for specific wound care orders. We will see patient for re-evaluation in 2 week(s) here in the clinic. If anything worsens or changes patient will contact our office for additional recommendations. Electronic Signature(s) Signed: 05/19/2018 9:18:35 AM By: Worthy Keeler PA-C Entered By: Worthy Keeler on 05/18/2018 01:09:17 BUENA, BOEHM ([id number] Rose, Suzette Battiest ([id number]) -------------------------------------------------------------------------------- ROS/PFSH Details Patient Name: [name]. Date of Service: 05/17/2018 2:30 PM Medical Record Number: [id number] Patient Account Number: [id number] Date of Birth/Sex: April 13, 1935 (83 y.o. F) Treating RN: Montey Hora Primary Care Provider: Rutherford Guys Other Clinician: Referring Provider: Rutherford Guys Treating Provider/Extender: Melburn Hake, Kionte Baumgardner Weeks in Treatment: 3 Information Obtained From Patient Wound History Do you currently have one or more open woundso Yes How many open wounds do you  currently haveo 1 Approximately how long have you had your woundso 2 years How have you been treating your wound(s) until nowo no dressings Has your wound(s) ever healed and then re-openedo Yes Have you had any lab work done in the past montho No Have you tested positive for an antibiotic resistant organism (MRSA, VRE)o No Have you tested positive for osteomyelitis (bone infection)o No Have you had any tests for circulation on your legso No Have you had other problems associated with your woundso Infection Constitutional Symptoms (General Health) Complaints and Symptoms: Negative for: Fever; Chills Cardiovascular Complaints and Symptoms: Positive for: LE edema Medical History: Positive for: Arrhythmia - afib; Congestive Heart Failure; Hypertension Negative for: Coronary Artery Disease; Deep Vein Thrombosis; Hypotension; Myocardial Infarction; Peripheral Arterial Disease; Peripheral Venous Disease; Phlebitis; Vasculitis Eyes Medical History: Positive for: Cataracts - removed left Negative for: Glaucoma; Optic Neuritis Ear/Nose/Mouth/Throat Medical History: Negative for: Chronic sinus problems/congestion; Middle ear problems Hematologic/Lymphatic Medical History: Positive for: Anemia - 2018 Negative for: Hemophilia; Human Immunodeficiency Virus; Lymphedema; Sickle Cell Disease Respiratory Complaints and Symptoms: No Complaints or Symptoms TARALYN, FERRAIOLO. ([id number]) Medical History: Positive for: Chronic Obstructive Pulmonary Disease (COPD) Negative for: Aspiration; Asthma; Pneumothorax; Sleep Apnea; Tuberculosis Gastrointestinal Medical History: Negative for: Cirrhosis ; Colitis; Crohnos; Hepatitis B Endocrine Medical History: Positive for: Type II Diabetes Time with diabetes: `15 years Treated with: Insulin, Oral agents Blood sugar tested every day: Yes Tested : bid Blood sugar testing results: Bedtime: 130 Genitourinary Medical History: Negative for: End Stage  Renal Disease Immunological Medical History: Negative for: Lupus Erythematosus; Raynaudos; Scleroderma Integumentary (Skin) Medical History: Negative for: History of Burn; History of pressure wounds Musculoskeletal Medical History: Positive for: Osteoarthritis Negative for: Gout; Rheumatoid Arthritis Neurologic Medical History: Negative for: Dementia; Neuropathy; Quadriplegia; Paraplegia; Seizure Disorder Psychiatric Complaints and Symptoms: No Complaints or Symptoms Medical History: Positive for: Confinement Anxiety Negative for: Anorexia/bulimia HBO Extended History Items Eyes: Cataracts Pettibone, Shanoah F. ([id number]) Immunizations Pneumococcal Vaccine: Received Pneumococcal Vaccination: Yes Implantable Devices Family and Social History Cancer: Yes - Father,Siblings; Diabetes: Yes - Maternal Grandparents; Heart Disease: Yes - Father; Hypertension: Yes - Mother,Father,Siblings; Kidney Disease: No; Lung Disease: Yes - Father,Siblings; Seizures: No; Stroke: Yes - Father; Thyroid Problems: No; Tuberculosis: No; Never smoker; Marital Status - Widowed; Alcohol Use: Never; Drug Use: No History; Caffeine Use: Daily; Financial Concerns: No; Food, Clothing or Shelter Needs: No; Support  System Lacking: No; Transportation Concerns: No; Advanced Directives: No; Patient does not want information on Advanced Directives; Do not resuscitate: No; Living Will: No; Medical Power of Attorney: No Physician Affirmation I have reviewed and agree with the above information. Electronic Signature(s) Signed: 05/19/2018 9:18:35 AM By: Worthy Keeler PA-C Signed: 05/20/2018 5:04:31 PM By: Montey Hora Entered By: Worthy Keeler on 05/18/2018 01:08:22 [name] ([id number]) -------------------------------------------------------------------------------- SuperBill Details Patient Name: [name]. Date of Service: 05/17/2018 Medical Record Number: [id number] Patient Account Number:  [id number] Date of Birth/Sex: [birth year] (83 y.o. F) Treating RN: Montey Hora Primary Care Provider: Rutherford Guys Other Clinician: Referring Provider: Rutherford Guys Treating Provider/Extender: Melburn Hake, Kreston Ahrendt Weeks in Treatment: 3 Diagnosis Coding ICD-10 Codes Code Description I89.0 Lymphedema, not elsewhere classified E11.622 Type 2 diabetes mellitus with other skin ulcer L97.822 Non-pressure chronic ulcer of other part of left lower leg with fat layer exposed I10 Essential (primary) hypertension I48.2 Chronic atrial fibrillation J44.9 Chronic obstructive pulmonary disease, unspecified Facility Procedures CPT4 Code: [id number] Description: 99213 - WOUND CARE VISIT-LEV 3 EST PT Modifier: Quantity: 1 Physician Procedures CPT4 Code Description: [id number] - WC PHYS LEVEL 4 - EST PT ICD-10 Diagnosis Description I89.0 Lymphedema, not elsewhere classified E11.622 Type 2 diabetes mellitus with other skin ulcer L97.822 Non-pressure chronic ulcer of other part of left lower  leg wit I10 Essential (primary) hypertension Modifier: h fat layer expos Quantity: 1 ed Electronic Signature(s) Signed: 05/19/2018 9:18:35 AM By: Worthy Keeler PA-C Entered By: Worthy Keeler on 05/18/2018 01:09:34

## 2018-05-24 ENCOUNTER — Encounter: Payer: Self-pay | Admitting: Oncology

## 2018-05-24 ENCOUNTER — Inpatient Hospital Stay: Payer: Medicare Other | Attending: Oncology

## 2018-05-24 ENCOUNTER — Ambulatory Visit: Payer: Medicare Other | Admitting: Oncology

## 2018-05-24 ENCOUNTER — Inpatient Hospital Stay (HOSPITAL_BASED_OUTPATIENT_CLINIC_OR_DEPARTMENT_OTHER): Payer: Medicare Other | Admitting: Oncology

## 2018-05-24 ENCOUNTER — Other Ambulatory Visit: Payer: Self-pay

## 2018-05-24 ENCOUNTER — Other Ambulatory Visit: Payer: Medicare Other

## 2018-05-24 VITALS — BP 148/108 | HR 88 | Temp 98.0°F | Resp 18 | Ht 64.0 in | Wt 177.8 lb

## 2018-05-24 DIAGNOSIS — D519 Vitamin B12 deficiency anemia, unspecified: Secondary | ICD-10-CM

## 2018-05-24 DIAGNOSIS — E538 Deficiency of other specified B group vitamins: Secondary | ICD-10-CM | POA: Insufficient documentation

## 2018-05-24 DIAGNOSIS — D509 Iron deficiency anemia, unspecified: Secondary | ICD-10-CM | POA: Insufficient documentation

## 2018-05-24 LAB — CBC
HEMATOCRIT: 33.5 % — AB (ref 35.0–47.0)
HEMOGLOBIN: 10.8 g/dL — AB (ref 12.0–16.0)
MCH: 28.1 pg (ref 26.0–34.0)
MCHC: 32.3 g/dL (ref 32.0–36.0)
MCV: 87 fL (ref 80.0–100.0)
Platelets: 187 10*3/uL (ref 150–440)
RBC: 3.85 MIL/uL (ref 3.80–5.20)
RDW: 15.2 % — ABNORMAL HIGH (ref 11.5–14.5)
WBC: 6.4 10*3/uL (ref 3.6–11.0)

## 2018-05-24 LAB — IRON AND TIBC
Iron: 35 ug/dL (ref 28–170)
Saturation Ratios: 11 % (ref 10.4–31.8)
TIBC: 306 ug/dL (ref 250–450)
UIBC: 271 ug/dL

## 2018-05-24 LAB — VITAMIN B12: Vitamin B-12: 512 pg/mL (ref 180–914)

## 2018-05-24 LAB — FERRITIN: FERRITIN: 42 ng/mL (ref 11–307)

## 2018-05-24 NOTE — Progress Notes (Signed)
No new changes noted today 

## 2018-05-27 NOTE — Progress Notes (Signed)
Hematology/Oncology Consult note [facility]  [phone] [phone]  Patient Care Team: Letta Median, MD as PCP - General Jackelyn Hoehn, Aura Fey, FNP as Nurse Practitioner (Family Medicine) Isaias Cowman, MD as Consulting Physician (Cardiology)   Name of the patient: Dorothy [name]  [id number]  [birth year]   Date of visit: 05/27/18  Diagnosis-  iron deficiencyand b12 deficiencyanemia  Chief complaint/ Reason for visit-routine follow-up of vitamin B12 deficiency anemia  Heme/Onc history: patient is a 83 year old female who was seen by Dr. Ouida Sills 2016 deficiency anemia. At that time she had an EGD and a colonoscopy which did not reveal any evidence of bleeding. She didn't get better with oral iron and was eventually asked to follow-up with her primary care doctor. She does have a remote h/o Billroth I gastro duodenostomy  Patient was admitted to the hospital for severe allergy angioedema and was found to have a pneumonia and heart failure in April 2018. Her hemoglobin was 7.3 with an MCV of 68. She has received 2 doses of venofer 200 mg along with one unit of blood transfusion. She was seen by Dr. Alice Reichert and underwent EGD and colonoscopy in October 2018. EGD showed evidence of billroth I but ws otherwise normal. Colonoscopy showed diverticulosis, internal and external hemorrhoids and 1 polyp (tubular adenoma). No active bleeding  Iron studies from 12/19/2016 showed a low serum iron of 13 and low iron saturation of 3% and normal TIBC of 426. Ferritin was low at 9 and B12 was normal at 151. Patient has receivedvenofer and B12 injections in the past   Interval history-patient reports fatigue today.  Denies any blood in her stool or dark melanotic stools.  She continues to live alone.  ECOG PS- 2 Pain scale- 0 Opioid associated constipation- no  Review of systems- Review of Systems  Constitutional: Positive for malaise/fatigue.  Negative for chills, fever and weight loss.  HENT: Negative for congestion, ear discharge and nosebleeds.   Eyes: Negative for blurred vision.  Respiratory: Negative for cough, hemoptysis, sputum production, shortness of breath and wheezing.   Cardiovascular: Negative for chest pain, palpitations, orthopnea and claudication.  Gastrointestinal: Negative for abdominal pain, blood in stool, constipation, diarrhea, heartburn, melena, nausea and vomiting.  Genitourinary: Negative for dysuria, flank pain, frequency, hematuria and urgency.  Musculoskeletal: Negative for back pain, joint pain and myalgias.  Skin: Negative for rash.  Neurological: Negative for dizziness, tingling, focal weakness, seizures, weakness and headaches.  Endo/Heme/Allergies: Does not bruise/bleed easily.  Psychiatric/Behavioral: Negative for depression and suicidal ideas. The patient does not have insomnia.       Allergies  Allergen Reactions  . Tramadol Anaphylaxis  . Aspirin      Past Medical History:  Diagnosis Date  . Anemia   . Arthritis    hands, knees, back  . Atrial fibrillation ([place])   . CHF (congestive heart failure) ([place])   . COPD (chronic obstructive pulmonary disease) ([place])   . Diabetes mellitus without complication ([place])   . Dyspnea   . GERD (gastroesophageal reflux disease)   . Hyperlipidemia   . Hypertension   . PONV (postoperative nausea and vomiting)   . Sleep apnea    told she needs CPAP, does not have  . Wears dentures    full upper and lower.  only wears upper     Past Surgical History:  Procedure Laterality Date  . ABDOMINAL HYSTERECTOMY    . CATARACT EXTRACTION W/PHACO Right 07/12/2016   Procedure: CATARACT EXTRACTION  PHACO AND INTRAOCULAR LENS PLACEMENT (IOC);  Surgeon: Leandrew Koyanagi, MD;  Location: [place];  Service: Ophthalmology;  Laterality: Right;  DIABETIC - insulin and oral meds sleep apnea  . CHOLECYSTECTOMY    . COLONOSCOPY N/A 01/19/2015    Procedure: COLONOSCOPY;  Surgeon: Josefine Class, MD;  Location[facility] END[facility]COPY;  Service: Endoscopy;  Laterality: N/A;  . COLONOSCOPY WITH PROPOFOL N/A 07/11/2017   Procedure: COLONOSCOPY WITH PROPOFOL;  Surgeon: Toledo, Benay Pike, MD;  Location: [facility] ENDOSCOPY;  Service: Gastroenterology;  Laterality: N/A;  . ESOPHAGOGASTRODUODENOSCOPY N/A 01/19/2015   Procedure: ESOPHAGOGASTRODUODENOSCOPY (EGD);  Surgeon: Josefine Class, MD;  Location: [facility] ENDOSCOPY;  Service: Endoscopy;  Laterality: N/A;  . ESOPHAGOGASTRODUODENOSCOPY (EGD) WITH PROPOFOL N/A 07/11/2017   Procedure: ESOPHAGOGASTRODUODENOSCOPY (EGD) WITH PROPOFOL;  Surgeon: Toledo, Benay Pike, MD;  Location: [facility] ENDOSCOPY;  Service: Gastroenterology;  Laterality: N/A;  . SHOULDER ARTHROSCOPY      Social History   Socioeconomic History  . Marital status: Widowed    Spouse name: Not on file  . Number of children: Not on file  . Years of education: Not on file  . Highest education level: Not on file  Occupational History  . Not on file  Social Needs  . Financial resource strain: Not on file  . Food insecurity:    Worry: Not on file    Inability: Not on file  . Transportation needs:    Medical: Not on file    Non-medical: Not on file  Tobacco Use  . Smoking status: Never Smoker  . Smokeless tobacco: Never Used  Substance and Sexual Activity  . Alcohol use: No  . Drug use: Not on file  . Sexual activity: Not on file  Lifestyle  . Physical activity:    Days per week: Not on file    Minutes per session: Not on file  . Stress: Not on file  Relationships  . Social connections:    Talks on phone: Not on file    Gets together: Not on file    Attends religious service: Not on file    Active member of club or organization: Not on file    Attends meetings of clubs or organizations: Not on file    Relationship status: Not on file  . Intimate partner violence:    Fear of current or ex partner: Not on file    Emotionally  abused: Not on file    Physically abused: Not on file    Forced sexual activity: Not on file  Other Topics Concern  . Not on file  Social History Narrative  . Not on file    History reviewed. No pertinent family history.   Current Outpatient Medications:  .  ACCU-CHEK SOFTCLIX LANCETS lancets, , Disp: , Rfl:  .  apixaban (ELIQUIS) 2.5 MG TABS tablet, Take by mouth., Disp: , Rfl:  .  atorvastatin (LIPITOR) 80 MG tablet, Take 80 mg by mouth daily., Disp: , Rfl:  .  Calcium Carb-Cholecalciferol (CALCIUM 600 + D PO), Take 1 tablet by mouth., Disp: , Rfl:  .  Calcium Carbonate-Vitamin D (CALCIUM HIGH POTENCY/VITAMIN D) 600-200 MG-UNIT TABS, Take by mouth., Disp: , Rfl:  .  carvedilol (COREG) 6.25 MG tablet, Take 1 tablet (6.25 mg total) by mouth 2 (two) times daily with a meal., Disp: 60 tablet, Rfl: 2 .  cloNIDine (CATAPRES) 0.1 MG tablet, Take 0.1 mg by mouth 2 (two) times daily., Disp: , Rfl:  .  colestipol (COLESTID) 1 g tablet, Take by  mouth., Disp: , Rfl:  .  esomeprazole (NEXIUM) 20 MG capsule, Take 20 mg by mouth at bedtime., Disp: , Rfl:  .  ferrous sulfate 325 (65 FE) MG tablet, Take 325 mg by mouth daily with breakfast., Disp: , Rfl:  .  insulin NPH Human (HUMULIN N,NOVOLIN N) 100 UNIT/ML injection, Inject 0.23 mLs (23 Units total) into the skin 2 (two) times daily before a meal. (Patient taking differently: Inject 25 Units into the skin 2 (two) times daily before a meal. Take 15 units at night), Disp: 10 mL, Rfl: 11 .  lisinopril (PRINIVIL,ZESTRIL) 10 MG tablet, Take 10 mg by mouth daily., Disp: , Rfl:  .  metFORMIN (GLUMETZA) 1000 MG (MOD) 24 hr tablet, Take 1,000 mg by mouth 2 (two) times daily., Disp: , Rfl:  .  metoprolol tartrate (LOPRESSOR) 25 MG tablet, Take 25 mg by mouth 2 (two) times daily., Disp: , Rfl:  .  potassium chloride (K-DUR) 10 MEQ tablet, Take 10 mEq by mouth 2 (two) times daily., Disp: , Rfl:  .  acetaminophen (TYLENOL) 500 MG tablet, Take 1,000 mg by mouth  every 6 (six) hours as needed for moderate pain or headache., Disp: , Rfl:  .  albuterol (PROVENTIL HFA;VENTOLIN HFA) 108 (90 BASE) MCG/ACT inhaler, Inhale 2 puffs into the lungs every 6 (six) hours as needed for wheezing or shortness of breath., Disp: , Rfl:  .  alendronate (FOSAMAX) 70 MG tablet, Take by mouth., Disp: , Rfl:  .  cyanocobalamin (,VITAMIN B-12,) 1000 MCG/ML injection, Inject into the muscle., Disp: , Rfl:  .  fluticasone (FLONASE) 50 MCG/ACT nasal spray, , Disp: , Rfl:  .  furosemide (LASIX) 20 MG tablet, Take 20 mg by mouth 2 (two) times daily., Disp: , Rfl:  .  glipiZIDE (GLUCOTROL XL) 10 MG 24 hr tablet, Take 10 mg by mouth daily with breakfast., Disp: , Rfl:  .  ipratropium-albuterol (DUONEB) 0.5-2.5 (3) MG/3ML SOLN, Take 3 mLs by nebulization every 6 (six) hours as needed. (Patient not taking: Reported on 05/24/2018), Disp: 360 mL, Rfl: 0 .  senna-docusate (SENOKOT-S) 8.6-50 MG tablet, Take 2 tablets by mouth 2 (two) times daily. (Patient not taking: Reported on 05/24/2018), Disp: 30 tablet, Rfl: 0  Physical exam:  Vitals:   05/24/18 1015  BP: (!) 148/108  Pulse: 88  Resp: 18  Temp: 98 F (36.7 C)  TempSrc: Tympanic  Weight: 177 lb 12.8 oz (80.6 kg)  Height: 5\' 4"  (1.626 m)   Physical Exam  Constitutional: She is oriented to person, place, and time.  Elderly woman who is ambulating with a walker.  Appears in no acute distress  HENT:  Head: Normocephalic and atraumatic.  Eyes: Pupils are equal, round, and reactive to light. EOM are normal.  Neck: Normal range of motion.  Cardiovascular: Normal rate and normal heart sounds.  Heart rate is regular  Pulmonary/Chest: Effort normal and breath sounds normal.  Abdominal: Soft. Bowel sounds are normal.  Musculoskeletal: She exhibits edema.  Neurological: She is alert and oriented to person, place, and time.  Skin: Skin is warm and dry.     CMP Latest Ref Rng & Units 12/27/2016  Glucose 65 - 99 mg/dL 179(H)  BUN 6 -  20 mg/dL 17  Creatinine 0.44 - 1.00 mg/dL 1.20(H)  Sodium 135 - 145 mmol/L 140  Potassium 3.5 - 5.1 mmol/L 3.9  Chloride 101 - 111 mmol/L 98(L)  CO2 22 - 32 mmol/L 31  Calcium 8.9 - 10.3 mg/dL 9.2  Total Protein 6.5 - 8.1 g/dL 6.6  Total Bilirubin 0.3 - 1.2 mg/dL 0.8  Alkaline Phos 38 - 126 U/L 81  AST 15 - 41 U/L 29  ALT 14 - 54 U/L 23   CBC Latest Ref Rng & Units 05/24/2018  WBC 3.6 - 11.0 K/uL 6.4  Hemoglobin 12.0 - 16.0 g/dL 10.8(L)  Hematocrit 35.0 - 47.0 % 33.5(L)  Platelets 150 - 440 K/uL 187    No images are attached to the encounter.  US Arterial Abi (screening Lower Extremity)  Result Date: 05/17/2018 CLINICAL DATA:  Left medial ankle ulceration. Hypertension, hyperlipidemia, diabetes. EXAM: NONINVASIVE PHYSIOLOGIC VASCULAR STUDY OF BILATERAL LOWER EXTREMITIES TECHNIQUE: Evaluation of both lower extremities were performed at rest, including calculation of ankle-brachial indices with single level Doppler, pressure and pulse volume recording. COMPARISON:  None. FINDINGS: Right ABI:  Non calculable due to vascular noncompressibility Left ABI:  Non calculable due to vascular noncompressibility Right Lower Extremity:  Normal arterial waveforms at the ankle. Left Lower Extremity:  Normal arterial waveforms at the ankle. IMPRESSION: 1. Normal distal lower extremity arterial waveforms at rest, but ABIs are non-diagnostic secondary to incompressible vessel calcifications (medial arterial sclerosis of [place]). Should the patient fail conservative treatment, consider MRA runoff (preferred over CTA in the setting of vascular calcifications, can be performed noncontrast in the setting of renal dysfunction) to better define the site and nature of arterial occlusive disease and delineate treatment options. Electronically Signed   By: Lucrezia Europe M.D.   On: 05/17/2018 14:05     Assessment and plan- Patient is a 83 y.o. female with iron and B12 deficiency anemia  Hemoglobin is currently  stable between 10-11.  Iron studies are normal and B12 levels are normal as well.  I will hold off on IV iron at this time.  Repeat CBC ferritin and iron studies in 3 in 6 months and I will see her back in 6 months.  She will continue oral B12 tablets at this time   Visit Diagnosis 1. Iron deficiency anemia, unspecified iron deficiency anemia type   2. B12 deficiency      Dr. Randa Evens, MD, MPH [facility] [phone] 05/27/2018 8:05 AM

## 2018-05-31 ENCOUNTER — Encounter: Payer: Medicare Other | Admitting: Physician Assistant

## 2018-05-31 DIAGNOSIS — I890 Lymphedema, not elsewhere classified: Secondary | ICD-10-CM | POA: Diagnosis not present

## 2018-06-04 NOTE — Progress Notes (Signed)
Dorothy [name] ([id number]) Visit Report for 05/31/2018 Chief Complaint Document Details Patient Name: Dorothy [name], Dorothy [name]. Date of Service: 05/31/2018 1:30 PM Medical Record Number: [id number] Patient Account Number: [id number] Date of Birth/Sex: [birth year] (83 y.o. F) Treating RN: Montey Hora Primary Care Provider: Rutherford Guys Other Clinician: Referring Provider: Rutherford Guys Treating Provider/Extender: Melburn Hake, Aidin Doane Weeks in Treatment: 5 Information Obtained from: Patient Chief Complaint Left medial lower extremity ulcer Electronic Signature(s) Signed: 06/03/2018 8:08:51 AM By: Worthy Keeler PA-C Entered By: Worthy Keeler on 05/31/2018 13:48:33 Ratliff, Dorothy [name] ([id number]) -------------------------------------------------------------------------------- HPI Details Patient Name: Dorothy [name]. Date of Service: 05/31/2018 1:30 PM Medical Record Number: [id number] Patient Account Number: [id number] Date of Birth/Sex: [birth year] (83 y.o. F) Treating RN: Montey Hora Primary Care Provider: Rutherford Guys Other Clinician: Referring Provider: Rutherford Guys Treating Provider/Extender: Melburn Hake, Damacio Weisgerber Weeks in Treatment: 5 History of Present Illness HPI Description: 04/26/18 on evaluation today patient presents for initial evaluation and clinic is referral from the [facility] clinic. She has been experiencing a wound on her left anterior lower extremity which has been intermittently open over the past two years. This is a site where she previously did have a biopsy performed which showed some type of cancer and subsequently she ended up having a larger surgical excision to completely remove everything. It actually appears based on a scar area that may have been more medial to where we're looking at right now. With that being said she does definitely have lymphedema and I do believe that this may be more of a lymphedema lesion as compared to truly be in a cancerous  lesion. Nonetheless I explained to the patient as well is her family member who was with her today that I'll be see I cannot be completely certain of that without biopsy. No other diagnostic imaging has been performed at this point. Patient does have diabetes her hemoglobin A1c most recent was 7.4% and this was on Jan 15, 2018. Upon further review of her labs from this date as well she also had an elevated creatinine and a glomerular filtration rate of 40 which was obviously low. She also did have a wound culture which was performed on 03/27/18. This revealed Staphylococcus aureus and the patient was originally given Keflex but then changed to a stronger antibiotic I'm unsure of what the antibiotic she was changed to was. Nonetheless that seems to have resolved the issue there does not appear to be any evidence of infection at this point. Her ABI's are noncompressible. No fevers, chills, nausea, or vomiting noted at this time. Other than the diabetes patient does have a history of lymphedema as well as chronic atrial fibrillation and COPD. 05/17/18 on evaluation today patient actually appears to be doing rather well in regard to her ulcer. In fact this has improved almost 50% since her last visit. This obviously is excellent news. With that being said I am not seeing at this point any evidence of infection which is also excellent news. She did have her arterial vascular studies. The impression was the patient did have normal [place] waveforms of the ankle bilaterally. However her right and left ABI's were noncompressible. The recommendation was should the patient feel conservative treatment consider MRA runoff to better define the site in nature of arterial occlusive disease and delineate treatment options. With that being said considering the fact that she seems to be doing better and is actually showing signs of making good progress I'm not even sure that a vascular  consulate is necessary at this  point. The patient in fact would like to avoid seeing any further doctors unless it becomes absolutely necessary. 05/31/18 on evaluation today the patient actually appears to be doing excellent in regard to her lower extremity ulcer at this point. She is shown signs of excellent improvement and there does not appear to be any evidence of infection at this time which is great news. Overall I have been very pleased with how she's progressing each time I see her. The wound bed definitely appears to be greatly improved from the standpoint of the overall appearance there's no your theme is surrounding and in general I think this is the best that I've seen her when since have been taking care of her. There's no need for sharp debridement today. Electronic Signature(s) Signed: 06/03/2018 8:08:51 AM By: Worthy Keeler PA-C Entered By: Worthy Keeler on 05/31/2018 13:57:16 Dorothy [name] ([id number]) -------------------------------------------------------------------------------- Physical Exam Details Patient Name: Dorothy [name], Dorothy [name]. Date of Service: 05/31/2018 1:30 PM Medical Record Number: [id number] Patient Account Number: [id number] Date of Birth/Sex: 11-18-1934 (83 y.o. F) Treating RN: Montey Hora Primary Care Provider: Rutherford Guys Other Clinician: Referring Provider: Rutherford Guys Treating Provider/Extender: STONE III, Nolene Rocks Weeks in Treatment: 5 Constitutional Well-nourished and well-hydrated in no acute distress. Respiratory normal breathing without difficulty. clear to auscultation bilaterally. Cardiovascular regular rate and rhythm with normal S1, S2. Psychiatric this patient is able to make decisions and demonstrates good insight into disease process. Alert and Oriented x 3. pleasant and cooperative. Notes Patient's wound bed shows excellent signs of improvement there's no necrotic tissue overlying the surface of the wound and in general I'm extremely pleased with how things appear  at this time. I'm gonna suggest that we continue with the Current wound care measures and again no debridement was necessary today. Electronic Signature(s) Signed: 06/03/2018 8:08:51 AM By: Worthy Keeler PA-C Entered By: Worthy Keeler on 05/31/2018 13:58:16 Dorothy [name] ([id number]) -------------------------------------------------------------------------------- Physician Orders Details Patient Name: Dorothy [name]. Date of Service: 05/31/2018 1:30 PM Medical Record Number: [id number] Patient Account Number: [id number] Date of Birth/Sex: August 08, 1935 (83 y.o. F) Treating RN: Montey Hora Primary Care Provider: Rutherford Guys Other Clinician: Referring Provider: Rutherford Guys Treating Provider/Extender: Melburn Hake, Geran Haithcock Weeks in Treatment: 5 Verbal / Phone Orders: No Diagnosis Coding ICD-10 Coding Code Description I89.0 Lymphedema, not elsewhere classified E11.622 Type 2 diabetes mellitus with other skin ulcer L97.822 Non-pressure chronic ulcer of other part of left lower leg with fat layer exposed I10 Essential (primary) hypertension I48.2 Chronic atrial fibrillation J44.9 Chronic obstructive pulmonary disease, unspecified Wound Cleansing Wound #1 Left,Medial Lower Leg o Clean wound with Normal Saline. o May Shower, gently pat wound dry prior to applying new dressing. Anesthetic (add to Medication List) Wound #1 Left,Medial Lower Leg o Topical Lidocaine 4% cream applied to wound bed prior to debridement (In Clinic Only). Skin Barriers/Peri-Wound Care Wound #1 Left,Medial Lower Leg o Skin Prep Primary Wound Dressing Wound #1 Left,Medial Lower Leg o Silver Collagen - moistened with saline Secondary Dressing Wound #1 Left,Medial Lower Leg o Boardered Foam Dressing Dressing Change Frequency Wound #1 Left,Medial Lower Leg o Three times weekly Follow-up Appointments Wound #1 Left,Medial Lower Leg o Return Appointment in 2 weeks. Edema Control Wound #1  Left,Medial Lower Leg Dorothy [name], Dorothy [name]. ([id number]) o Elevate legs to the level of the heart and pump ankles as often as possible Additional Orders / Instructions Wound #1 Left,Medial Lower Leg o Increase protein intake. o Activity  as tolerated o Other: - try to keep blood sugars below 180 Electronic Signature(s) Signed: 05/31/2018 5:18:43 PM By: Montey Hora Signed: 06/03/2018 8:08:51 AM By: Worthy Keeler PA-C Entered By: Montey Hora on 05/31/2018 13:55:03 Dorothy [name] ([id number]) -------------------------------------------------------------------------------- Problem List Details Patient Name: Dorothy [name]. Date of Service: 05/31/2018 1:30 PM Medical Record Number: [id number] Patient Account Number: [id number] Date of Birth/Sex: 1935/04/01 (83 y.o. F) Treating RN: Montey Hora Primary Care Provider: Rutherford Guys Other Clinician: Referring Provider: Rutherford Guys Treating Provider/Extender: Melburn Hake, Keianna Signer Weeks in Treatment: 5 Active Problems ICD-10 Evaluated Encounter Code Description Active Date Today Diagnosis I89.0 Lymphedema, not elsewhere classified 04/26/2018 No Yes E11.622 Type 2 diabetes mellitus with other skin ulcer 04/26/2018 No Yes L97.822 Non-pressure chronic ulcer of other part of left lower leg with 04/26/2018 No Yes fat layer exposed I10 Essential (primary) hypertension 04/26/2018 No Yes I48.2 Chronic atrial fibrillation 04/26/2018 No Yes J44.9 Chronic obstructive pulmonary disease, unspecified 04/26/2018 No Yes Inactive Problems Resolved Problems Electronic Signature(s) Signed: 06/03/2018 8:08:51 AM By: Worthy Keeler PA-C Entered By: Worthy Keeler on 05/31/2018 13:48:25 Wardle, Dorothy [name] ([id number]) -------------------------------------------------------------------------------- Progress Note Details Patient Name: Dorothy [name]. Date of Service: 05/31/2018 1:30 PM Medical Record Number: [id number] Patient Account Number:  [id number] Date of Birth/Sex: 1935/03/21 (83 y.o. F) Treating RN: Montey Hora Primary Care Provider: Rutherford Guys Other Clinician: Referring Provider: Rutherford Guys Treating Provider/Extender: Melburn Hake, Ryanne Morand Weeks in Treatment: 5 Subjective Chief Complaint Information obtained from Patient Left medial lower extremity ulcer History of Present Illness (HPI) 04/26/18 on evaluation today patient presents for initial evaluation and clinic is referral from the [facility] clinic. She has been experiencing a wound on her left anterior lower extremity which has been intermittently open over the past two years. This is a site where she previously did have a biopsy performed which showed some type of cancer and subsequently she ended up having a larger surgical excision to completely remove everything. It actually appears based on a scar area that may have been more medial to where we're looking at right now. With that being said she does definitely have lymphedema and I do believe that this may be more of a lymphedema lesion as compared to truly be in a cancerous lesion. Nonetheless I explained to the patient as well is her family member who was with her today that I'll be see I cannot be completely certain of that without biopsy. No other diagnostic imaging has been performed at this point. Patient does have diabetes her hemoglobin A1c most recent was 7.4% and this was on Jan 15, 2018. Upon further review of her labs from this date as well she also had an elevated creatinine and a glomerular filtration rate of 40 which was obviously low. She also did have a wound culture which was performed on 03/27/18. This revealed Staphylococcus aureus and the patient was originally given Keflex but then changed to a stronger antibiotic I'm unsure of what the antibiotic she was changed to was. Nonetheless that seems to have resolved the issue there does not appear to be any evidence of infection at this point. Her  ABI's are noncompressible. No fevers, chills, nausea, or vomiting noted at this time. Other than the diabetes patient does have a history of lymphedema as well as chronic atrial fibrillation and COPD. 05/17/18 on evaluation today patient actually appears to be doing rather well in regard to her ulcer. In fact this has improved almost 50% since her last visit.  This obviously is excellent news. With that being said I am not seeing at this point any evidence of infection which is also excellent news. She did have her arterial vascular studies. The impression was the patient did have normal [place] waveforms of the ankle bilaterally. However her right and left ABI's were noncompressible. The recommendation was should the patient feel conservative treatment consider MRA runoff to better define the site in nature of arterial occlusive disease and delineate treatment options. With that being said considering the fact that she seems to be doing better and is actually showing signs of making good progress I'm not even sure that a vascular consulate is necessary at this point. The patient in fact would like to avoid seeing any further doctors unless it becomes absolutely necessary. 05/31/18 on evaluation today the patient actually appears to be doing excellent in regard to her lower extremity ulcer at this point. She is shown signs of excellent improvement and there does not appear to be any evidence of infection at this time which is great news. Overall I have been very pleased with how she's progressing each time I see her. The wound bed definitely appears to be greatly improved from the standpoint of the overall appearance there's no your theme is surrounding and in general I think this is the best that I've seen her when since have been taking care of her. There's no need for sharp debridement today. Patient History Information obtained from Patient. Family History Cancer - Father,Siblings, Diabetes -  Maternal Grandparents, Heart Disease - Father, Hypertension - Mother,Father,Siblings, Lung Disease - Father,Siblings, Stroke - Father, No family history of Kidney Disease, Seizures, Thyroid Problems, Tuberculosis. Dorothy [name], Dorothy [name] ([id number]) Social History Never smoker, Marital Status - Widowed, Alcohol Use - Never, Drug Use - No History, Caffeine Use - Daily. Review of Systems (ROS) Constitutional Symptoms (General Health) Denies complaints or symptoms of Fever, Chills. Respiratory The patient has no complaints or symptoms. Cardiovascular The patient has no complaints or symptoms. Psychiatric The patient has no complaints or symptoms. Objective Constitutional Well-nourished and well-hydrated in no acute distress. Vitals Time Taken: 1:34 PM, Height: 64 in, Weight: 173.4 lbs, BMI: 29.8, Temperature: 97.5 F, Pulse: 71 bpm, Respiratory Rate: 18 breaths/min, Blood Pressure: 177/73 mmHg. Respiratory normal breathing without difficulty. clear to auscultation bilaterally. Cardiovascular regular rate and rhythm with normal S1, S2. Psychiatric this patient is able to make decisions and demonstrates good insight into disease process. Alert and Oriented x 3. pleasant and cooperative. General Notes: Patient's wound bed shows excellent signs of improvement there's no necrotic tissue overlying the surface of the wound and in general I'm extremely pleased with how things appear at this time. I'm gonna suggest that we continue with the Current wound care measures and again no debridement was necessary today. Integumentary (Hair, Skin) Wound #1 status is Open. Original cause of wound was Gradually Appeared. The wound is located on the Left,Medial Lower Leg. The wound measures 0.5cm length x 0.4cm width x 0.1cm depth; 0.157cm^2 area and 0.016cm^3 volume. There is a small amount of serous drainage noted. The wound margin is indistinct and nonvisible. There is small (1-33%) red granulation within  the wound bed. There is no necrotic tissue within the wound bed. The periwound skin appearance exhibited: Dry/Scaly, Maceration. The periwound skin appearance did not exhibit: Callus, Crepitus, Excoriation, Induration, Rash, Scarring, Atrophie Blanche, Cyanosis, Ecchymosis, Hemosiderin Staining, Mottled, Pallor, Rubor, Erythema. Assessment Dorothy [name], Dorothy [name] ([id number]) Active Problems ICD-10 Lymphedema, not elsewhere classified Type 2 diabetes  mellitus with other skin ulcer Non-pressure chronic ulcer of other part of left lower leg with fat layer exposed Essential (primary) hypertension Chronic atrial fibrillation Chronic obstructive pulmonary disease, unspecified Plan Wound Cleansing: Wound #1 Left,Medial Lower Leg: Clean wound with Normal Saline. May Shower, gently pat wound dry prior to applying new dressing. Anesthetic (add to Medication List): Wound #1 Left,Medial Lower Leg: Topical Lidocaine 4% cream applied to wound bed prior to debridement (In Clinic Only). Skin Barriers/Peri-Wound Care: Wound #1 Left,Medial Lower Leg: Skin Prep Primary Wound Dressing: Wound #1 Left,Medial Lower Leg: Silver Collagen - moistened with saline Secondary Dressing: Wound #1 Left,Medial Lower Leg: Boardered Foam Dressing Dressing Change Frequency: Wound #1 Left,Medial Lower Leg: Three times weekly Follow-up Appointments: Wound #1 Left,Medial Lower Leg: Return Appointment in 2 weeks. Edema Control: Wound #1 Left,Medial Lower Leg: Elevate legs to the level of the heart and pump ankles as often as possible Additional Orders / Instructions: Wound #1 Left,Medial Lower Leg: Increase protein intake. Activity as tolerated Other: - try to keep blood sugars below 180 We will continue with the above wound care measures for the next week. The patient is in agreement the plan as is her granddaughter who continues to perform the dressing changes for her. If anything changes or worsens they will let us  know otherwise will see were things stand at follow-up. Please see above for specific wound care orders. We will see patient for re-evaluation in 2 week(s) here in the clinic. If anything worsens or changes patient will contact our office for additional recommendations. Dorothy [name], Dorothy [name] ([id number]) Electronic Signature(s) Signed: 06/03/2018 8:08:51 AM By: Worthy Keeler PA-C Entered By: Worthy Keeler on 05/31/2018 13:58:31 Dorothy [name] ([id number]) -------------------------------------------------------------------------------- ROS/PFSH Details Patient Name: Dorothy [name]. Date of Service: 05/31/2018 1:30 PM Medical Record Number: [id number] Patient Account Number: [id number] Date of Birth/Sex: [birth year] (83 y.o. F) Treating RN: Montey Hora Primary Care Provider: Rutherford Guys Other Clinician: Referring Provider: Rutherford Guys Treating Provider/Extender: Melburn Hake, Danylle Ouk Weeks in Treatment: 5 Information Obtained From Patient Wound History Do you currently have one or more open woundso Yes How many open wounds do you currently haveo 1 Approximately how long have you had your woundso 2 years How have you been treating your wound(s) until nowo no dressings Has your wound(s) ever healed and then re-openedo Yes Have you had any lab work done in the past montho No Have you tested positive for an antibiotic resistant organism (MRSA, VRE)o No Have you tested positive for osteomyelitis (bone infection)o No Have you had any tests for circulation on your legso No Have you had other problems associated with your woundso Infection Constitutional Symptoms (General Health) Complaints and Symptoms: Negative for: Fever; Chills Eyes Medical History: Positive for: Cataracts - removed left Negative for: Glaucoma; Optic Neuritis Ear/Nose/Mouth/Throat Medical History: Negative for: Chronic sinus problems/congestion; Middle ear problems Hematologic/Lymphatic Medical History: Positive  for: Anemia - 2018 Negative for: Hemophilia; Human Immunodeficiency Virus; Lymphedema; Sickle Cell Disease Respiratory Complaints and Symptoms: No Complaints or Symptoms Medical History: Positive for: Chronic Obstructive Pulmonary Disease (COPD) Negative for: Aspiration; Asthma; Pneumothorax; Sleep Apnea; Tuberculosis Cardiovascular Complaints and Symptoms: No Complaints or Symptoms Dorothy [name], [name] ([id number]) Medical History: Positive for: Arrhythmia - afib; Congestive Heart Failure; Hypertension Negative for: Coronary Artery Disease; Deep Vein Thrombosis; Hypotension; Myocardial Infarction; Peripheral Arterial Disease; Peripheral Venous Disease; Phlebitis; Vasculitis Gastrointestinal Medical History: Negative for: Cirrhosis ; Colitis; Crohnos; Hepatitis B Endocrine Medical History: Positive for: Type II Diabetes Time with diabetes: `  15 years Treated with: Insulin, Oral agents Blood sugar tested every day: Yes Tested : bid Blood sugar testing results: Bedtime: 130 Genitourinary Medical History: Negative for: End Stage Renal Disease Immunological Medical History: Negative for: Lupus Erythematosus; Raynaudos; Scleroderma Integumentary (Skin) Medical History: Negative for: History of Burn; History of pressure wounds Musculoskeletal Medical History: Positive for: Osteoarthritis Negative for: Gout; Rheumatoid Arthritis Neurologic Medical History: Negative for: Dementia; Neuropathy; Quadriplegia; Paraplegia; Seizure Disorder Psychiatric Complaints and Symptoms: No Complaints or Symptoms Medical History: Positive for: Confinement Anxiety Negative for: Anorexia/bulimia HBO Extended History Items Eyes: Cataracts Dorothy [name], [name] ([id number]) Immunizations Pneumococcal Vaccine: Received Pneumococcal Vaccination: Yes Implantable Devices Family and Social History Cancer: Yes - Father,Siblings; Diabetes: Yes - Maternal Grandparents; Heart Disease: Yes - Father;  Hypertension: Yes - Mother,Father,Siblings; Kidney Disease: No; Lung Disease: Yes - Father,Siblings; Seizures: No; Stroke: Yes - Father; Thyroid Problems: No; Tuberculosis: No; Never smoker; Marital Status - Widowed; Alcohol Use: Never; Drug Use: No History; Caffeine Use: Daily; Financial Concerns: No; Food, Clothing or Shelter Needs: No; Support System Lacking: No; Transportation Concerns: No; Advanced Directives: No; Patient does not want information on Advanced Directives; Do not resuscitate: No; Living Will: No; Medical Power of Attorney: No Physician Affirmation I have reviewed and agree with the above information. Electronic Signature(s) Signed: 05/31/2018 5:18:43 PM By: Montey Hora Signed: 06/03/2018 8:08:51 AM By: Worthy Keeler PA-C Entered By: Worthy Keeler on 05/31/2018 13:57:37 Dorothy [name], Dorothy [name] ([id number]) -------------------------------------------------------------------------------- SuperBill Details Patient Name: Dorothy [name]. Date of Service: 05/31/2018 Medical Record Number: [id number] Patient Account Number: [id number] Date of Birth/Sex: [birth year] (83 y.o. F) Treating RN: Montey Hora Primary Care Provider: Rutherford Guys Other Clinician: Referring Provider: Rutherford Guys Treating Provider/Extender: Melburn Hake, Colin Norment Weeks in Treatment: 5 Diagnosis Coding ICD-10 Codes Code Description I89.0 Lymphedema, not elsewhere classified E11.622 Type 2 diabetes mellitus with other skin ulcer L97.822 Non-pressure chronic ulcer of other part of left lower leg with fat layer exposed I10 Essential (primary) hypertension I48.2 Chronic atrial fibrillation J44.9 Chronic obstructive pulmonary disease, unspecified Facility Procedures CPT4 Code: [id number] Description: 99213 - WOUND CARE VISIT-LEV 3 EST PT Modifier: Quantity: 1 Physician Procedures CPT4 Code Description: [id number] - WC PHYS LEVEL 3 - EST PT ICD-10 Diagnosis Description I89.0 Lymphedema, not elsewhere  classified E11.622 Type 2 diabetes mellitus with other skin ulcer L97.822 Non-pressure chronic ulcer of other part of left lower  leg wit I10 Essential (primary) hypertension Modifier: h fat layer expos Quantity: 1 ed Electronic Signature(s) Signed: 06/03/2018 8:08:51 AM By: Worthy Keeler PA-C Entered By: Worthy Keeler on 05/31/2018 13:58:49

## 2018-06-04 NOTE — Progress Notes (Signed)
Dorothy [name] ([id number]) Visit Report for 05/31/2018 Arrival Information Details Patient Name: Dorothy, [name]. Date of Service: 05/31/2018 1:30 PM Medical Record Number: [id number] Patient Account Number: [id number] Date of Birth/Sex: 1935-08-25 (83 y.o. F) Treating RN: Secundino Ginger Primary Care Izeyah Deike: Rutherford Guys Other Clinician: Referring Timarion Agcaoili: Rutherford Guys Treating Aayushi Solorzano/Extender: Melburn Hake, HOYT Weeks in Treatment: 5 Visit Information History Since Last Visit Added or deleted any medications: No Patient Arrived: Ambulatory Any new allergies or adverse reactions: No Arrival Time: 13:33 Had a fall or experienced change in No Accompanied By: family activities of daily living that may affect Transfer Assistance: None risk of falls: Patient Identification Verified: Yes Signs or symptoms of abuse/neglect since last visito No Secondary Verification Process Yes Hospitalized since last visit: No Completed: Implantable device outside of the clinic excluding No Patient Has Alerts: Yes cellular tissue based products placed in the center Patient Alerts: Patient on Blood since last visit: Thinner Has Dressing in Place as Prescribed: Yes Eliquis Pain Present Now: No DMII Electronic Signature(s) Signed: 05/31/2018 4:01:10 PM By: Secundino Ginger Entered By: Secundino Ginger on 05/31/2018 13:33:57 Dorothy [name] ([id number]) -------------------------------------------------------------------------------- Clinic Level of Care Assessment Details Patient Name: Dorothy [name]. Date of Service: 05/31/2018 1:30 PM Medical Record Number: [id number] Patient Account Number: [id number] Date of Birth/Sex: [birth year] (83 y.o. F) Treating RN: Montey Hora Primary Care Tiffanie Blassingame: Rutherford Guys Other Clinician: Referring Triva Hueber: Rutherford Guys Treating Demetrius Mahler/Extender: Melburn Hake, HOYT Weeks in Treatment: 5 Clinic Level of Care Assessment Items TOOL 4 Quantity Score []  - Use when only an  EandM is performed on FOLLOW-UP visit 0 ASSESSMENTS - Nursing Assessment / Reassessment X - Reassessment of Co-morbidities (includes updates in patient status) 1 10 X- 1 5 Reassessment of Adherence to Treatment Plan ASSESSMENTS - Wound and Skin Assessment / Reassessment X - Simple Wound Assessment / Reassessment - one wound 1 5 []  - 0 Complex Wound Assessment / Reassessment - multiple wounds []  - 0 Dermatologic / Skin Assessment (not related to wound area) ASSESSMENTS - Focused Assessment X - Circumferential Edema Measurements - multi extremities 1 5 []  - 0 Nutritional Assessment / Counseling / Intervention X- 1 5 Lower Extremity Assessment (monofilament, tuning fork, pulses) []  - 0 Peripheral Arterial Disease Assessment (using hand held doppler) ASSESSMENTS - Ostomy and/or Continence Assessment and Care []  - Incontinence Assessment and Management 0 []  - 0 Ostomy Care Assessment and Management (repouching, etc.) PROCESS - Coordination of Care X - Simple Patient / Family Education for ongoing care 1 15 []  - 0 Complex (extensive) Patient / Family Education for ongoing care X- 1 10 Staff obtains Programmer, systems, Records, Test Results / Process Orders []  - 0 Staff telephones HHA, Nursing Homes / Clarify orders / etc []  - 0 Routine Transfer to another Facility (non-emergent condition) []  - 0 Routine Hospital Admission (non-emergent condition) []  - 0 New Admissions / Biomedical engineer / Ordering NPWT, Apligraf, etc. []  - 0 Emergency Hospital Admission (emergent condition) X- 1 10 Simple Discharge Coordination [name], [name] ([id number]) []  - 0 Complex (extensive) Discharge Coordination PROCESS - Special Needs []  - Pediatric / Minor Patient Management 0 []  - 0 Isolation Patient Management []  - 0 Hearing / Language / Visual special needs []  - 0 Assessment of Community assistance (transportation, D/C planning, etc.) []  - 0 Additional assistance / Altered mentation []  -  0 Support Surface(s) Assessment (bed, cushion, seat, etc.) INTERVENTIONS - Wound Cleansing / Measurement X - Simple Wound Cleansing - one wound 1 5 []  -  0 Complex Wound Cleansing - multiple wounds X- 1 5 Wound Imaging (photographs - any number of wounds) []  - 0 Wound Tracing (instead of photographs) X- 1 5 Simple Wound Measurement - one wound []  - 0 Complex Wound Measurement - multiple wounds INTERVENTIONS - Wound Dressings X - Small Wound Dressing one or multiple wounds 1 10 []  - 0 Medium Wound Dressing one or multiple wounds []  - 0 Large Wound Dressing one or multiple wounds []  - 0 Application of Medications - topical []  - 0 Application of Medications - injection INTERVENTIONS - Miscellaneous []  - External ear exam 0 []  - 0 Specimen Collection (cultures, biopsies, blood, body fluids, etc.) []  - 0 Specimen(s) / Culture(s) sent or taken to Lab for analysis []  - 0 Patient Transfer (multiple staff / Civil Service fast streamer / Similar devices) []  - 0 Simple Staple / Suture removal (25 or less) []  - 0 Complex Staple / Suture removal (26 or more) []  - 0 Hypo / Hyperglycemic Management (close monitor of Blood Glucose) []  - 0 Ankle / Brachial Index (ABI) - do not check if billed separately X- 1 5 Vital Signs Dorothy [name], [name] ([id number]) Has the patient been seen at the hospital within the last three years: Yes Total Score: 95 Level Of Care: New/Established - Level 3 Electronic Signature(s) Signed: 05/31/2018 5:18:43 PM By: Montey Hora Entered By: Montey Hora on 05/31/2018 13:55:30 Dorothy [name] ([id number]) -------------------------------------------------------------------------------- Encounter Discharge Information Details Patient Name: Dorothy [name]. Date of Service: 05/31/2018 1:30 PM Medical Record Number: [id number] Patient Account Number: [id number] Date of Birth/Sex: [birth year] (83 y.o. F) Treating RN: Montey Hora Primary Care Cleopha Indelicato: Rutherford Guys Other  Clinician: Referring Andria Head: Rutherford Guys Treating Iya Hamed/Extender: Melburn Hake, HOYT Weeks in Treatment: 5 Encounter Discharge Information Items Discharge Condition: Stable Ambulatory Status: Cane Discharge Destination: Home Transportation: Private Auto Accompanied By: granddaughter Schedule Follow-up Appointment: Yes Clinical Summary of Care: Electronic Signature(s) Signed: 05/31/2018 5:18:43 PM By: Montey Hora Entered By: Montey Hora on 05/31/2018 13:59:56 Dorothy [name] ([id number]) -------------------------------------------------------------------------------- Lower Extremity Assessment Details Patient Name: Dorothy [name]. Date of Service: 05/31/2018 1:30 PM Medical Record Number: [id number] Patient Account Number: [id number] Date of Birth/Sex: 26-May-1935 (83 y.o. F) Treating RN: Secundino Ginger Primary Care Quincee Gittens: Rutherford Guys Other Clinician: Referring Khelani Kops: Rutherford Guys Treating Bobbiejo Ishikawa/Extender: Melburn Hake, HOYT Weeks in Treatment: 5 Edema Assessment Assessed: [Left: No] [Right: No] [Left: Edema] [Right: :] Calf Left: Right: Point of Measurement: 32 cm From Medial Instep cm cm Ankle Left: Right: Point of Measurement: 10 cm From Medial Instep cm cm Vascular Assessment Claudication: Claudication Assessment [Left:None] [Right:None] Pulses: Dorsalis Pedis Palpable: [Right:Yes] Posterior Tibial Extremity colors, hair growth, and conditions: Extremity Color: [Right:Normal] Hair Growth on Extremity: [Left:No] [Right:No] Temperature of Extremity: [Right:Cool] Capillary Refill: [Right:< 3 seconds] Toe Nail Assessment Left: Right: Thick: Yes Discolored: No Deformed: No Improper Length and Hygiene: No Electronic Signature(s) Signed: 05/31/2018 4:01:10 PM By: Secundino Ginger Entered By: Secundino Ginger on 05/31/2018 13:43:58 Dorothy [name], [name] ([id number]) -------------------------------------------------------------------------------- Multi Wound Chart  Details Patient Name: Dorothy [name]. Date of Service: 05/31/2018 1:30 PM Medical Record Number: [id number] Patient Account Number: [id number] Date of Birth/Sex: 1935/05/10 (83 y.o. F) Treating RN: Montey Hora Primary Care Rashaun Wichert: Rutherford Guys Other Clinician: Referring Tayler Heiden: Rutherford Guys Treating Elic Vencill/Extender: STONE III, HOYT Weeks in Treatment: 5 Vital Signs Height(in): 64 Pulse(bpm): 71 Weight(lbs): 173.4 Blood Pressure(mmHg): 177/73 Body Mass Index(BMI): 30 Temperature(F): 97.5 Respiratory Rate 18 (breaths/min): Photos: [1:No Photos] [N/A:N/A] Wound Location: [1:Left Lower Leg - Medial] [  N/A:N/A] Wounding Event: [1:Gradually Appeared] [N/A:N/A] Primary Etiology: [1:Diabetic Wound/Ulcer of the Lower Extremity] [N/A:N/A] Comorbid History: [1:Cataracts, Anemia, Chronic Obstructive Pulmonary Disease (COPD), Arrhythmia, Congestive Heart Failure, Hypertension, Type II Diabetes, Osteoarthritis, Confinement Anxiety] [N/A:N/A] Date Acquired: [1:04/26/2016] [N/A:N/A] Weeks of Treatment: [1:5] [N/A:N/A] Wound Status: [1:Open] [N/A:N/A] Measurements L x W x D [1:0.5x0.4x0.1] [N/A:N/A] (cm) Area (cm) : [1:0.157] [N/A:N/A] Volume (cm) : [1:0.016] [N/A:N/A] % Reduction in Area: [1:91.10%] [N/A:N/A] % Reduction in Volume: [1:91.00%] [N/A:N/A] Classification: [1:Grade 2] [N/A:N/A] Exudate Amount: [1:Small] [N/A:N/A] Exudate Type: [1:Serous] [N/A:N/A] Exudate Color: [1:amber] [N/A:N/A] Wound Margin: [1:Indistinct, nonvisible] [N/A:N/A] Granulation Amount: [1:Small (1-33%)] [N/A:N/A] Granulation Quality: [1:Red] [N/A:N/A] Necrotic Amount: [1:None Present (0%)] [N/A:N/A] Exposed Structures: [1:Fascia: No Fat Layer (Subcutaneous Tissue) Exposed: No Tendon: No Muscle: No Joint: No Bone: No] [N/A:N/A] Epithelialization: [1:None] [N/A:N/A] Periwound Skin Texture: Excoriation: No N/A N/A Induration: No Callus: No Crepitus: No Rash: No Scarring: No Periwound Skin  Moisture: Maceration: Yes N/A N/A Dry/Scaly: Yes Periwound Skin Color: Atrophie Blanche: No N/A N/A Cyanosis: No Ecchymosis: No Erythema: No Hemosiderin Staining: No Mottled: No Pallor: No Rubor: No Tenderness on Palpation: No N/A N/A Wound Preparation: Ulcer Cleansing: N/A N/A Rinsed/Irrigated with Saline Topical Anesthetic Applied: Other: lidocaine 4% Treatment Notes Electronic Signature(s) Signed: 05/31/2018 5:18:43 PM By: Montey Hora Entered By: Montey Hora on 05/31/2018 13:54:33 Dorothy [name] ([id number]) -------------------------------------------------------------------------------- Multi-Disciplinary Care Plan Details Patient Name: Dorothy [name]. Date of Service: 05/31/2018 1:30 PM Medical Record Number: [id number] Patient Account Number: [id number] Date of Birth/Sex: Aug 15, 1935 (83 y.o. F) Treating RN: Montey Hora Primary Care Tephanie Escorcia: Rutherford Guys Other Clinician: Referring Kerman Pfost: Rutherford Guys Treating Hiroto Saltzman/Extender: Melburn Hake, HOYT Weeks in Treatment: 5 Active Inactive ` Abuse / Safety / Falls / Self Care Management Nursing Diagnoses: Impaired physical mobility Goals: Patient will remain injury free related to falls Date Initiated: 04/26/2018 Target Resolution Date: 07/12/2018 Goal Status: Active Interventions: Assess fall risk on admission and as needed Notes: ` Orientation to the Wound Care Program Nursing Diagnoses: Knowledge deficit related to the wound healing center program Goals: Patient/caregiver will verbalize understanding of the [place] Date Initiated: 04/26/2018 Target Resolution Date: 07/12/2018 Goal Status: Active Interventions: Provide education on orientation to the wound center Notes: ` Pain, Acute or Chronic Nursing Diagnoses: Pain, acute or chronic: actual or potential Goals: Patient will verbalize adequate pain control and receive pain control interventions during procedures as  needed Date Initiated: 04/26/2018 Target Resolution Date: 07/12/2018 Goal Status: Active Interventions: ARSENIA, GORACKE ([id number]) Assess comfort goal upon admission Notes: ` Wound/Skin Impairment Nursing Diagnoses: Impaired tissue integrity Goals: Ulcer/skin breakdown will heal within 14 weeks Date Initiated: 04/26/2018 Target Resolution Date: 07/12/2018 Goal Status: Active Interventions: Assess patient/caregiver ability to obtain necessary supplies Assess patient/caregiver ability to perform ulcer/skin care regimen upon admission and as needed Assess ulceration(s) every visit Notes: Electronic Signature(s) Signed: 05/31/2018 5:18:43 PM By: Montey Hora Entered By: Montey Hora on 05/31/2018 13:54:27 Dorothy [name], Dorothy [name] ([id number]) -------------------------------------------------------------------------------- Pain Assessment Details Patient Name: Dorothy [name]. Date of Service: 05/31/2018 1:30 PM Medical Record Number: [id number] Patient Account Number: [id number] Date of Birth/Sex: 1935/06/27 (83 y.o. F) Treating RN: Secundino Ginger Primary Care Aqil Goetting: Rutherford Guys Other Clinician: Referring Priscilla Finklea: Rutherford Guys Treating Jone Panebianco/Extender: Melburn Hake, HOYT Weeks in Treatment: 5 Active Problems Location of Pain Severity and Description of Pain Patient Has Paino No Site Locations Pain Management and Medication Current Pain Management: Goals for Pain Management pt denies any pain at this time. Electronic Signature(s) Signed: 05/31/2018 4:01:10 PM By:  Ng, Wendi Entered By: Secundino Ginger on 05/31/2018 13:34:14 Dorothy [name] ([id number]) -------------------------------------------------------------------------------- Patient/Caregiver Education Details Patient Name: Dorothy [name]. Date of Service: 05/31/2018 1:30 PM Medical Record Number: [id number] Patient Account Number: [id number] Date of Birth/Gender: 15-May-1935 (83 y.o. F) Treating RN: Montey Hora Primary  Care Physician: Rutherford Guys Other Clinician: Referring Physician: Rutherford Guys Treating Physician/Extender: Sharalyn Ink in Treatment: 5 Education Assessment Education Provided To: Patient and Caregiver Education Topics Provided Wound/Skin Impairment: Handouts: Other: wound care as ordered Methods: Demonstration, Explain/Verbal Responses: State content correctly Electronic Signature(s) Signed: 05/31/2018 5:18:43 PM By: Montey Hora Entered By: Montey Hora on 05/31/2018 14:00:13 Dorothy [name] ([id number]) -------------------------------------------------------------------------------- Wound Assessment Details Patient Name: Dorothy [name]. Date of Service: 05/31/2018 1:30 PM Medical Record Number: [id number] Patient Account Number: [id number] Date of Birth/Sex: Jan 28, 1935 (83 y.o. F) Treating RN: Secundino Ginger Primary Care Rada Zegers: Rutherford Guys Other Clinician: Referring Arvis Zwahlen: Rutherford Guys Treating Man Effertz/Extender: STONE III, HOYT Weeks in Treatment: 5 Wound Status Wound Number: 1 Primary Diabetic Wound/Ulcer of the Lower Extremity Etiology: Wound Location: Left Lower Leg - Medial Wound Open Wounding Event: Gradually Appeared Status: Date Acquired: 04/26/2016 Comorbid Cataracts, Anemia, Chronic Obstructive Weeks Of Treatment: 5 History: Pulmonary Disease (COPD), Arrhythmia, Clustered Wound: No Congestive Heart Failure, Hypertension, Type II Diabetes, Osteoarthritis, Confinement Anxiety Photos Photo Uploaded By: Secundino Ginger on 05/31/2018 14:33:28 Wound Measurements Length: (cm) 0.5 % Reduction Width: (cm) 0.4 % Reduction Depth: (cm) 0.1 Epitheliali Area: (cm) 0.157 Volume: (cm) 0.016 in Area: 91.1% in Volume: 91% zation: None Wound Description Classification: Grade 2 Foul Odor Wound Margin: Indistinct, nonvisible Slough/Fib Exudate Amount: Small Exudate Type: Serous Exudate Color: amber After Cleansing: No rino No Wound Bed Granulation  Amount: Small (1-33%) Exposed Structure Granulation Quality: Red Fascia Exposed: No Necrotic Amount: None Present (0%) Fat Layer (Subcutaneous Tissue) Exposed: No Tendon Exposed: No Muscle Exposed: No Joint Exposed: No Bone Exposed: No Dorothy [name], [name] ([id number]) Periwound Skin Texture Texture Color No Abnormalities Noted: No No Abnormalities Noted: No Callus: No Atrophie Blanche: No Crepitus: No Cyanosis: No Excoriation: No Ecchymosis: No Induration: No Erythema: No Rash: No Hemosiderin Staining: No Scarring: No Mottled: No Pallor: No Moisture Rubor: No No Abnormalities Noted: No Dry / Scaly: Yes Maceration: Yes Wound Preparation Ulcer Cleansing: Rinsed/Irrigated with Saline Topical Anesthetic Applied: Other: lidocaine 4%, Treatment Notes Wound #1 (Left, Medial Lower Leg) 1. Cleansed with: Clean wound with Normal Saline 2. Anesthetic Topical Lidocaine 4% cream to wound bed prior to debridement 3. Peri-wound Care: Skin Prep 4. Dressing Applied: Prisma Ag 5. Secondary Dressing Applied Bordered Foam Dressing Electronic Signature(s) Signed: 05/31/2018 4:01:10 PM By: Secundino Ginger Entered By: Secundino Ginger on 05/31/2018 13:42:50 Dorothy [name] ([id number]) -------------------------------------------------------------------------------- Vitals Details Patient Name: Dorothy [name]. Date of Service: 05/31/2018 1:30 PM Medical Record Number: [id number] Patient Account Number: [id number] Date of Birth/Sex: 15-Nov-1934 (83 y.o. F) Treating RN: Secundino Ginger Primary Care Zita Ozimek: Rutherford Guys Other Clinician: Referring Alizey Noren: Rutherford Guys Treating Johnanna Bakke/Extender: Melburn Hake, HOYT Weeks in Treatment: 5 Vital Signs Time Taken: 13:34 Temperature (F): 97.5 Height (in): 64 Pulse (bpm): 71 Weight (lbs): 173.4 Respiratory Rate (breaths/min): 18 Body Mass Index (BMI): 29.8 Blood Pressure (mmHg): 177/73 Reference Range: 80 - 120 mg / dl Electronic  Signature(s) Signed: 05/31/2018 4:01:10 PM By: Secundino Ginger Entered BySecundino Ginger on 05/31/2018 13:35:15

## 2018-06-14 ENCOUNTER — Encounter: Payer: Medicare Other | Attending: Physician Assistant | Admitting: Physician Assistant

## 2018-06-14 DIAGNOSIS — F419 Anxiety disorder, unspecified: Secondary | ICD-10-CM | POA: Diagnosis not present

## 2018-06-14 DIAGNOSIS — Z809 Family history of malignant neoplasm, unspecified: Secondary | ICD-10-CM | POA: Insufficient documentation

## 2018-06-14 DIAGNOSIS — I482 Chronic atrial fibrillation, unspecified: Secondary | ICD-10-CM | POA: Diagnosis not present

## 2018-06-14 DIAGNOSIS — L97822 Non-pressure chronic ulcer of other part of left lower leg with fat layer exposed: Secondary | ICD-10-CM | POA: Diagnosis not present

## 2018-06-14 DIAGNOSIS — E11622 Type 2 diabetes mellitus with other skin ulcer: Secondary | ICD-10-CM | POA: Diagnosis not present

## 2018-06-14 DIAGNOSIS — J449 Chronic obstructive pulmonary disease, unspecified: Secondary | ICD-10-CM | POA: Diagnosis not present

## 2018-06-14 DIAGNOSIS — I509 Heart failure, unspecified: Secondary | ICD-10-CM | POA: Insufficient documentation

## 2018-06-14 DIAGNOSIS — I110 Hypertensive heart disease with heart failure: Secondary | ICD-10-CM | POA: Insufficient documentation

## 2018-06-14 DIAGNOSIS — Z8249 Family history of ischemic heart disease and other diseases of the circulatory system: Secondary | ICD-10-CM | POA: Insufficient documentation

## 2018-06-14 DIAGNOSIS — M1990 Unspecified osteoarthritis, unspecified site: Secondary | ICD-10-CM | POA: Insufficient documentation

## 2018-06-14 DIAGNOSIS — I890 Lymphedema, not elsewhere classified: Secondary | ICD-10-CM | POA: Diagnosis not present

## 2018-06-19 NOTE — Progress Notes (Signed)
INARI, SHIN ([id number]) Visit Report for 06/14/2018 Arrival Information Details Patient Name: Dorothy [name], Dorothy [name]. Date of Service: 06/14/2018 1:45 PM Medical Record Number: [id number] Patient Account Number: [id number] Date of Birth/Sex: [birth year] (83 y.o. F) Treating RN: Montey Hora Primary Care Jerzy Crotteau: Rutherford Guys Other Clinician: Referring Faraz Ponciano: Rutherford Guys Treating Nakeda Lebron/Extender: Melburn Hake, HOYT Weeks in Treatment: 7 Visit Information History Since Last Visit Added or deleted any medications: No Patient Arrived: Walker Any new allergies or adverse reactions: No Arrival Time: 13:42 Had a fall or experienced change in No Accompanied By: granddaughter activities of daily living that may affect Transfer Assistance: None risk of falls: Patient Identification Verified: Yes Signs or symptoms of abuse/neglect since last visito No Secondary Verification Process Yes Hospitalized since last visit: No Completed: Implantable device outside of the clinic excluding No Patient Has Alerts: Yes cellular tissue based products placed in the center Patient Alerts: Patient on Blood since last visit: Thinner Has Dressing in Place as Prescribed: Yes Eliquis Pain Present Now: No DMII Electronic Signature(s) Signed: 06/14/2018 4:03:51 PM By: Lorine Bears RCP, RRT, CHT Entered By: Lorine Bears on 06/14/2018 13:43:40 Dorothy [name] ([id number]) -------------------------------------------------------------------------------- Clinic Level of Care Assessment Details Patient Name: Dorothy [name]. Date of Service: 06/14/2018 1:45 PM Medical Record Number: [id number] Patient Account Number: [id number] Date of Birth/Sex: 1935/04/02 (83 y.o. F) Treating RN: Montey Hora Primary Care Jodelle Fausto: Rutherford Guys Other Clinician: Referring Shiya Fogelman: Rutherford Guys Treating Labrian Torregrossa/Extender: Melburn Hake, HOYT Weeks in Treatment: 7 Clinic Level of Care  Assessment Items TOOL 4 Quantity Score []  - Use when only an EandM is performed on FOLLOW-UP visit 0 ASSESSMENTS - Nursing Assessment / Reassessment X - Reassessment of Co-morbidities (includes updates in patient status) 1 10 X- 1 5 Reassessment of Adherence to Treatment Plan ASSESSMENTS - Wound and Skin Assessment / Reassessment X - Simple Wound Assessment / Reassessment - one wound 1 5 []  - 0 Complex Wound Assessment / Reassessment - multiple wounds []  - 0 Dermatologic / Skin Assessment (not related to wound area) ASSESSMENTS - Focused Assessment []  - Circumferential Edema Measurements - multi extremities 0 []  - 0 Nutritional Assessment / Counseling / Intervention X- 1 5 Lower Extremity Assessment (monofilament, tuning fork, pulses) []  - 0 Peripheral Arterial Disease Assessment (using hand held doppler) ASSESSMENTS - Ostomy and/or Continence Assessment and Care []  - Incontinence Assessment and Management 0 []  - 0 Ostomy Care Assessment and Management (repouching, etc.) PROCESS - Coordination of Care X - Simple Patient / Family Education for ongoing care 1 15 []  - 0 Complex (extensive) Patient / Family Education for ongoing care []  - 0 Staff obtains Programmer, systems, Records, Test Results / Process Orders []  - 0 Staff telephones HHA, Nursing Homes / Clarify orders / etc []  - 0 Routine Transfer to another Facility (non-emergent condition) []  - 0 Routine Hospital Admission (non-emergent condition) []  - 0 New Admissions / Biomedical engineer / Ordering NPWT, Apligraf, etc. []  - 0 Emergency Hospital Admission (emergent condition) X- 1 10 Simple Discharge Coordination SAHIRA, CATALDI. ([id number]) []  - 0 Complex (extensive) Discharge Coordination PROCESS - Special Needs []  - Pediatric / Minor Patient Management 0 []  - 0 Isolation Patient Management []  - 0 Hearing / Language / Visual special needs []  - 0 Assessment of Community assistance (transportation, D/C planning,  etc.) []  - 0 Additional assistance / Altered mentation []  - 0 Support Surface(s) Assessment (bed, cushion, seat, etc.) INTERVENTIONS - Wound Cleansing / Measurement X - Simple Wound Cleansing - one  wound 1 5 []  - 0 Complex Wound Cleansing - multiple wounds X- 1 5 Wound Imaging (photographs - any number of wounds) []  - 0 Wound Tracing (instead of photographs) X- 1 5 Simple Wound Measurement - one wound []  - 0 Complex Wound Measurement - multiple wounds INTERVENTIONS - Wound Dressings X - Small Wound Dressing one or multiple wounds 1 10 []  - 0 Medium Wound Dressing one or multiple wounds []  - 0 Large Wound Dressing one or multiple wounds []  - 0 Application of Medications - topical []  - 0 Application of Medications - injection INTERVENTIONS - Miscellaneous []  - External ear exam 0 []  - 0 Specimen Collection (cultures, biopsies, blood, body fluids, etc.) []  - 0 Specimen(s) / Culture(s) sent or taken to Lab for analysis []  - 0 Patient Transfer (multiple staff / Civil Service fast streamer / Similar devices) []  - 0 Simple Staple / Suture removal (25 or less) []  - 0 Complex Staple / Suture removal (26 or more) []  - 0 Hypo / Hyperglycemic Management (close monitor of Blood Glucose) []  - 0 Ankle / Brachial Index (ABI) - do not check if billed separately X- 1 5 Vital Signs Molnar, Camila F. ([id number]) Has the patient been seen at the hospital within the last three years: Yes Total Score: 80 Level Of Care: New/Established - Level 3 Electronic Signature(s) Signed: 06/14/2018 5:05:51 PM By: Montey Hora Entered By: Montey Hora on 06/14/2018 15:27:30 Dorothy [name] ([id number]) -------------------------------------------------------------------------------- Encounter Discharge Information Details Patient Name: Dorothy [name]. Date of Service: 06/14/2018 1:45 PM Medical Record Number: [id number] Patient Account Number: [id number] Date of Birth/Sex: [birth year] (83 y.o. F) Treating RN:  Montey Hora Primary Care Mairi Stagliano: Rutherford Guys Other Clinician: Referring Eames Dibiasio: Rutherford Guys Treating Grayer Sproles/Extender: Melburn Hake, HOYT Weeks in Treatment: 7 Encounter Discharge Information Items Discharge Condition: Stable Ambulatory Status: Walker Discharge Destination: Home Transportation: Private Auto Accompanied By: granddaughter Schedule Follow-up Appointment: Yes Clinical Summary of Care: Electronic Signature(s) Signed: 06/14/2018 3:30:17 PM By: Montey Hora Entered By: Montey Hora on 06/14/2018 15:30:16 Dorothy [name] ([id number]) -------------------------------------------------------------------------------- Lower Extremity Assessment Details Patient Name: Dorothy [name]. Date of Service: 06/14/2018 1:45 PM Medical Record Number: [id number] Patient Account Number: [id number] Date of Birth/Sex: 30-Jan-1935 (83 y.o. F) Treating RN: Secundino Ginger Primary Care Aviyanna Colbaugh: Rutherford Guys Other Clinician: Referring Eilis Chestnutt: Rutherford Guys Treating Juvon Teater/Extender: Melburn Hake, HOYT Weeks in Treatment: 7 Electronic Signature(s) Signed: 06/14/2018 4:32:55 PM By: Secundino Ginger Entered By: Secundino Ginger on 06/14/2018 14:08:02 Dorothy [name] ([id number]) -------------------------------------------------------------------------------- Multi Wound Chart Details Patient Name: Dorothy [name]. Date of Service: 06/14/2018 1:45 PM Medical Record Number: [id number] Patient Account Number: [id number] Date of Birth/Sex: Jun 19, 1935 (83 y.o. F) Treating RN: Montey Hora Primary Care Iliani Vejar: Rutherford Guys Other Clinician: Referring Thierno Hun: Rutherford Guys Treating Davonne Baby/Extender: STONE III, HOYT Weeks in Treatment: 7 Vital Signs Height(in): 64 Pulse(bpm): 32 Weight(lbs): 173.4 Blood Pressure(mmHg): 142/51 Body Mass Index(BMI): 30 Temperature(F): 98.2 Respiratory Rate 18 (breaths/min): Photos: [1:No Photos] [N/A:N/A] Wound Location: [1:Left Lower Leg - Medial]  [N/A:N/A] Wounding Event: [1:Gradually Appeared] [N/A:N/A] Primary Etiology: [1:Diabetic Wound/Ulcer of the Lower Extremity] [N/A:N/A] Comorbid History: [1:Cataracts, Anemia, Chronic Obstructive Pulmonary Disease (COPD), Arrhythmia, Congestive Heart Failure, Hypertension, Type II Diabetes, Osteoarthritis, Confinement Anxiety] [N/A:N/A] Date Acquired: [1:04/26/2016] [N/A:N/A] Weeks of Treatment: [1:7] [N/A:N/A] Wound Status: [1:Open] [N/A:N/A] Measurements L x W x D [1:0.1x0.1x0.1] [N/A:N/A] (cm) Area (cm) : [1:0.008] [N/A:N/A] Volume (cm) : [1:0.001] [N/A:N/A] % Reduction in Area: [1:99.50%] [N/A:N/A] % Reduction in Volume: [1:99.40%] [N/A:N/A] Classification: [1:Grade 2] [N/A:N/A] Exudate Amount: [  1:Small] [N/A:N/A] Exudate Type: [1:Serous] [N/A:N/A] Exudate Color: [1:amber] [N/A:N/A] Wound Margin: [1:Indistinct, nonvisible] [N/A:N/A] Granulation Amount: [1:Small (1-33%)] [N/A:N/A] Granulation Quality: [1:Red] [N/A:N/A] Necrotic Amount: [1:None Present (0%)] [N/A:N/A] Exposed Structures: [1:Fascia: No Fat Layer (Subcutaneous Tissue) Exposed: No Tendon: No Muscle: No Joint: No Bone: No] [N/A:N/A] Epithelialization: [1:None] [N/A:N/A] Periwound Skin Texture: Excoriation: No N/A N/A Induration: No Callus: No Crepitus: No Rash: No Scarring: No Periwound Skin Moisture: Maceration: No N/A N/A Dry/Scaly: No Periwound Skin Color: Atrophie Blanche: No N/A N/A Cyanosis: No Ecchymosis: No Erythema: No Hemosiderin Staining: No Mottled: No Pallor: No Rubor: No Tenderness on Palpation: No N/A N/A Wound Preparation: Ulcer Cleansing: N/A N/A Rinsed/Irrigated with Saline Topical Anesthetic Applied: Other: lidocaine 4% Treatment Notes Electronic Signature(s) Signed: 06/14/2018 5:05:51 PM By: Montey Hora Entered By: Montey Hora on 06/14/2018 14:43:33 Dorothy [name]  ([id number]) -------------------------------------------------------------------------------- [place] Details Patient Name: Dorothy [name]. Date of Service: 06/14/2018 1:45 PM Medical Record Number: [id number] Patient Account Number: [id number] Date of Birth/Sex: [birth year] (83 y.o. F) Treating RN: Montey Hora Primary Care Cait Locust: Rutherford Guys Other Clinician: Referring Eliese Kerwood: Rutherford Guys Treating Tabbitha Janvrin/Extender: Melburn Hake, HOYT Weeks in Treatment: 7 Active Inactive ` Abuse / Safety / Falls / Self Care Management Nursing Diagnoses: Impaired physical mobility Goals: Patient will remain injury free related to falls Date Initiated: 04/26/2018 Target Resolution Date: 07/12/2018 Goal Status: Active Interventions: Assess fall risk on admission and as needed Notes: ` Orientation to the Wound Care Program Nursing Diagnoses: Knowledge deficit related to the wound healing center program Goals: Patient/caregiver will verbalize understanding of the [place] Date Initiated: 04/26/2018 Target Resolution Date: 07/12/2018 Goal Status: Active Interventions: Provide education on orientation to the wound center Notes: ` Pain, Acute or Chronic Nursing Diagnoses: Pain, acute or chronic: actual or potential Goals: Patient will verbalize adequate pain control and receive pain control interventions during procedures as needed Date Initiated: 04/26/2018 Target Resolution Date: 07/12/2018 Goal Status: Active Interventions: ARLEENE, SETTLE ([id number]) Assess comfort goal upon admission Notes: ` Wound/Skin Impairment Nursing Diagnoses: Impaired tissue integrity Goals: Ulcer/skin breakdown will heal within 14 weeks Date Initiated: 04/26/2018 Target Resolution Date: 07/12/2018 Goal Status: Active Interventions: Assess patient/caregiver ability to obtain necessary supplies Assess patient/caregiver ability to perform ulcer/skin care regimen  upon admission and as needed Assess ulceration(s) every visit Notes: Electronic Signature(s) Signed: 06/14/2018 5:05:51 PM By: Montey Hora Entered By: Montey Hora on 06/14/2018 14:43:26 Vorhees, Suzette Battiest ([id number]) -------------------------------------------------------------------------------- Pain Assessment Details Patient Name: Dorothy [name]. Date of Service: 06/14/2018 1:45 PM Medical Record Number: [id number] Patient Account Number: [id number] Date of Birth/Sex: [birth year] (83 y.o. F) Treating RN: Montey Hora Primary Care Adriana Quinby: Rutherford Guys Other Clinician: Referring Fitz Matsuo: Rutherford Guys Treating Avry Monteleone/Extender: Melburn Hake, HOYT Weeks in Treatment: 7 Active Problems Location of Pain Severity and Description of Pain Patient Has Paino No Site Locations Pain Management and Medication Current Pain Management: Electronic Signature(s) Signed: 06/14/2018 4:03:51 PM By: Lorine Bears RCP, RRT, CHT Signed: 06/14/2018 5:05:51 PM By: Montey Hora Entered By: Lorine Bears on 06/14/2018 13:43:46 Dorothy [name] ([id number]) -------------------------------------------------------------------------------- Patient/Caregiver Education Details Patient Name: Dorothy [name]. Date of Service: 06/14/2018 1:45 PM Medical Record Number: [id number] Patient Account Number: [id number] Date of Birth/Gender: 1935-06-12 (83 y.o. F) Treating RN: Montey Hora Primary Care Physician: Rutherford Guys Other Clinician: Referring Physician: Rutherford Guys Treating Physician/Extender: Sharalyn Ink in Treatment: 7 Education Assessment Education Provided To: Patient and Caregiver Education Topics Provided Wound/Skin Impairment: Handouts: Other: wound care as  ordered Methods: Demonstration, Explain/Verbal Responses: State content correctly Electronic Signature(s) Signed: 06/14/2018 5:05:51 PM By: Montey Hora Entered By: Montey Hora on  06/14/2018 15:30:36 Dorothy [name] ([id number]) -------------------------------------------------------------------------------- Wound Assessment Details Patient Name: Dorothy [name]. Date of Service: 06/14/2018 1:45 PM Medical Record Number: [id number] Patient Account Number: [id number] Date of Birth/Sex: 26-Feb-1935 (83 y.o. F) Treating RN: Secundino Ginger Primary Care Ena Demary: Rutherford Guys Other Clinician: Referring Cathline Dowen: Rutherford Guys Treating Larae Caison/Extender: STONE III, HOYT Weeks in Treatment: 7 Wound Status Wound Number: 1 Primary Diabetic Wound/Ulcer of the Lower Extremity Etiology: Wound Location: Left Lower Leg - Medial Wound Open Wounding Event: Gradually Appeared Status: Date Acquired: 04/26/2016 Comorbid Cataracts, Anemia, Chronic Obstructive Weeks Of Treatment: 7 History: Pulmonary Disease (COPD), Arrhythmia, Clustered Wound: No Congestive Heart Failure, Hypertension, Type II Diabetes, Osteoarthritis, Confinement Anxiety Photos Photo Uploaded By: Secundino Ginger on 06/14/2018 16:29:07 Wound Measurements Length: (cm) 0.1 % Reduction Width: (cm) 0.1 % Reduction Depth: (cm) 0.1 Epitheliali Area: (cm) 0.008 Tunneling: Volume: (cm) 0.001 Underminin in Area: 99.5% in Volume: 99.4% zation: None No g: No Wound Description Classification: Grade 2 Foul Odor Wound Margin: Indistinct, nonvisible Slough/Fib Exudate Amount: Small Exudate Type: Serous Exudate Color: amber After Cleansing: No rino No Wound Bed Granulation Amount: Small (1-33%) Exposed Structure Granulation Quality: Red Fascia Exposed: No Necrotic Amount: None Present (0%) Fat Layer (Subcutaneous Tissue) Exposed: No Tendon Exposed: No Muscle Exposed: No Joint Exposed: No Bone Exposed: No Steadham, Mayuri F. ([id number]) Periwound Skin Texture Texture Color No Abnormalities Noted: No No Abnormalities Noted: No Callus: No Atrophie Blanche: No Crepitus: No Cyanosis: No Excoriation:  No Ecchymosis: No Induration: No Erythema: No Rash: No Hemosiderin Staining: No Scarring: No Mottled: No Pallor: No Moisture Rubor: No No Abnormalities Noted: No Dry / Scaly: No Maceration: No Wound Preparation Ulcer Cleansing: Rinsed/Irrigated with Saline Topical Anesthetic Applied: Other: lidocaine 4%, Treatment Notes Wound #1 (Left, Medial Lower Leg) 1. Cleansed with: Clean wound with Normal Saline 2. Anesthetic Topical Lidocaine 4% cream to wound bed prior to debridement 3. Peri-wound Care: Skin Prep 4. Dressing Applied: Prisma Ag 5. Secondary Dressing Applied Bordered Foam Dressing Electronic Signature(s) Signed: 06/14/2018 4:32:55 PM By: Secundino Ginger Entered By: Secundino Ginger on 06/14/2018 14:07:50 Dorothy [name] ([id number]) -------------------------------------------------------------------------------- Vitals Details Patient Name: Dorothy [name]. Date of Service: 06/14/2018 1:45 PM Medical Record Number: [id number] Patient Account Number: [id number] Date of Birth/Sex: Apr 14, 1935 (83 y.o. F) Treating RN: Montey Hora Primary Care Julius Boniface: Rutherford Guys Other Clinician: Referring Xochilth Standish: Rutherford Guys Treating Jeannene Tschetter/Extender: Melburn Hake, HOYT Weeks in Treatment: 7 Vital Signs Time Taken: 13:43 Temperature (F): 98.2 Height (in): 64 Pulse (bpm): 64 Weight (lbs): 173.4 Respiratory Rate (breaths/min): 18 Body Mass Index (BMI): 29.8 Blood Pressure (mmHg): 142/51 Reference Range: 80 - 120 mg / dl Electronic Signature(s) Signed: 06/14/2018 4:03:51 PM By: Lorine Bears RCP, RRT, CHT Entered By: Lorine Bears on 06/14/2018 13:47:52

## 2018-06-20 NOTE — Progress Notes (Signed)
ALANTIS, BETHUNE ([id number]) Visit Report for 06/14/2018 Chief Complaint Document Details Patient Name: Dorothy [name], Dorothy [name]. Date of Service: 06/14/2018 1:45 PM Medical Record Number: [id number] Patient Account Number: [id number] Date of Birth/Sex: 08/12/1935 (83 y.o. F) Treating RN: Montey Hora Primary Care Provider: Rutherford Guys Other Clinician: Referring Provider: Rutherford Guys Treating Provider/Extender: Melburn Hake, Bryonna Sundby Weeks in Treatment: 7 Information Obtained from: Patient Chief Complaint Left medial lower extremity ulcer Electronic Signature(s) Signed: 06/17/2018 1:32:29 PM By: Worthy Keeler PA-C Entered By: Worthy Keeler on 06/14/2018 13:59:30 Dorothy [name] ([id number]) -------------------------------------------------------------------------------- HPI Details Patient Name: Dorothy [name]. Date of Service: 06/14/2018 1:45 PM Medical Record Number: [id number] Patient Account Number: [id number] Date of Birth/Sex: [birth year] (83 y.o. F) Treating RN: Montey Hora Primary Care Provider: Rutherford Guys Other Clinician: Referring Provider: Rutherford Guys Treating Provider/Extender: Melburn Hake, Kherington Meraz Weeks in Treatment: 7 History of Present Illness HPI Description: 04/26/18 on evaluation today patient presents for initial evaluation and clinic is referral from the [facility] clinic. She has been experiencing a wound on her left anterior lower extremity which has been intermittently open over the past two years. This is a site where she previously did have a biopsy performed which showed some type of cancer and subsequently she ended up having a larger surgical excision to completely remove everything. It actually appears based on a scar area that may have been more medial to where we're looking at right now. With that being said she does definitely have lymphedema and I do believe that this may be more of a lymphedema lesion as compared to truly be in a cancerous  lesion. Nonetheless I explained to the patient as well is her family member who was with her today that I'll be see I cannot be completely certain of that without biopsy. No other diagnostic imaging has been performed at this point. Patient does have diabetes her hemoglobin A1c most recent was 7.4% and this was on Jan 15, 2018. Upon further review of her labs from this date as well she also had an elevated creatinine and a glomerular filtration rate of 40 which was obviously low. She also did have a wound culture which was performed on 03/27/18. This revealed Staphylococcus aureus and the patient was originally given Keflex but then changed to a stronger antibiotic I'm unsure of what the antibiotic she was changed to was. Nonetheless that seems to have resolved the issue there does not appear to be any evidence of infection at this point. Her ABI's are noncompressible. No fevers, chills, nausea, or vomiting noted at this time. Other than the diabetes patient does have a history of lymphedema as well as chronic atrial fibrillation and COPD. 05/17/18 on evaluation today patient actually appears to be doing rather well in regard to her ulcer. In fact this has improved almost 50% since her last visit. This obviously is excellent news. With that being said I am not seeing at this point any evidence of infection which is also excellent news. She did have her arterial vascular studies. The impression was the patient did have normal [place] waveforms of the ankle bilaterally. However her right and left ABI's were noncompressible. The recommendation was should the patient feel conservative treatment consider MRA runoff to better define the site in nature of arterial occlusive disease and delineate treatment options. With that being said considering the fact that she seems to be doing better and is actually showing signs of making good progress I'm not even sure that a vascular  consulate is necessary at this  point. The patient in fact would like to avoid seeing any further doctors unless it becomes absolutely necessary. 05/31/18 on evaluation today the patient actually appears to be doing excellent in regard to her lower extremity ulcer at this point. She is shown signs of excellent improvement and there does not appear to be any evidence of infection at this time which is great news. Overall I have been very pleased with how she's progressing each time I see her. The wound bed definitely appears to be greatly improved from the standpoint of the overall appearance there's no your theme is surrounding and in general I think this is the best that I've seen her when since have been taking care of her. There's no need for sharp debridement today. 06/14/18 On evaluation today patient actually appears to be doing excellent in regard to her lower extremity ulcer. She's been tolerating the dressing changes without complication and in general everything seems to be making excellent progress there just to very small almost pinpoint openings noted on her extremity at the site of the ulcer both of which are shown signs of good epithelialization. No fevers, chills, nausea, or vomiting noted at this time. Electronic Signature(s) Signed: 06/17/2018 1:32:29 PM By: Worthy Keeler PA-C Entered By: Worthy Keeler on 06/17/2018 09:53:35 Daigneault, Suzette Battiest ([id number]) -------------------------------------------------------------------------------- Physical Exam Details Patient Name: Dorothy [name]. Date of Service: 06/14/2018 1:45 PM Medical Record Number: [id number] Patient Account Number: [id number] Date of Birth/Sex: 1935/01/15 (83 y.o. F) Treating RN: Montey Hora Primary Care Provider: Rutherford Guys Other Clinician: Referring Provider: Rutherford Guys Treating Provider/Extender: STONE III, Karaline Buresh Weeks in Treatment: 7 Constitutional Well-nourished and well-hydrated in no acute distress. Respiratory normal  breathing without difficulty. clear to auscultation bilaterally. Cardiovascular regular rate and rhythm with normal S1, S2. Psychiatric this patient is able to make decisions and demonstrates good insight into disease process. Alert and Oriented x 3. pleasant and cooperative. Notes Patient's wound bed at this time shows evidence of good epithelialization there is no evidence of infection and overall I'm very pleased with how things stand. Electronic Signature(s) Signed: 06/17/2018 1:32:29 PM By: Worthy Keeler PA-C Entered By: Worthy Keeler on 06/17/2018 09:54:06 Dorothy [name] ([id number]) -------------------------------------------------------------------------------- Physician Orders Details Patient Name: Dorothy [name]. Date of Service: 06/14/2018 1:45 PM Medical Record Number: [id number] Patient Account Number: [id number] Date of Birth/Sex: [birth year] (83 y.o. F) Treating RN: Montey Hora Primary Care Provider: Rutherford Guys Other Clinician: Referring Provider: Rutherford Guys Treating Provider/Extender: Melburn Hake, Brydon Spahr Weeks in Treatment: 7 Verbal / Phone Orders: No Diagnosis Coding ICD-10 Coding Code Description I89.0 Lymphedema, not elsewhere classified E11.622 Type 2 diabetes mellitus with other skin ulcer L97.822 Non-pressure chronic ulcer of other part of left lower leg with fat layer exposed I10 Essential (primary) hypertension I48.2 Chronic atrial fibrillation J44.9 Chronic obstructive pulmonary disease, unspecified Wound Cleansing Wound #1 Left,Medial Lower Leg o Clean wound with Normal Saline. o May Shower, gently pat wound dry prior to applying new dressing. Anesthetic (add to Medication List) Wound #1 Left,Medial Lower Leg o Topical Lidocaine 4% cream applied to wound bed prior to debridement (In Clinic Only). Skin Barriers/Peri-Wound Care Wound #1 Left,Medial Lower Leg o Skin Prep Primary Wound Dressing Wound #1 Left,Medial Lower Leg o Silver  Collagen - moistened with saline Secondary Dressing Wound #1 Left,Medial Lower Leg o Boardered Foam Dressing Dressing Change Frequency Wound #1 Left,Medial Lower Leg o Three times weekly Follow-up Appointments Wound #1 Left,Medial Lower  Leg o Return Appointment in 2 weeks. Edema Control Wound #1 Left,Medial Lower Leg CAITRIONA, SUNDQUIST. ([id number]) o Elevate legs to the level of the heart and pump ankles as often as possible Additional Orders / Instructions Wound #1 Left,Medial Lower Leg o Increase protein intake. o Activity as tolerated o Other: - try to keep blood sugars below 180 Electronic Signature(s) Signed: 06/14/2018 5:05:51 PM By: Montey Hora Signed: 06/17/2018 1:32:29 PM By: Worthy Keeler PA-C Entered By: Montey Hora on 06/14/2018 14:43:47 Dorothy [name] ([id number]) -------------------------------------------------------------------------------- Problem List Details Patient Name: Dorothy [name]. Date of Service: 06/14/2018 1:45 PM Medical Record Number: [id number] Patient Account Number: [id number] Date of Birth/Sex: 1934-12-12 (83 y.o. F) Treating RN: Montey Hora Primary Care Provider: Rutherford Guys Other Clinician: Referring Provider: Rutherford Guys Treating Provider/Extender: Melburn Hake, Tasheba Henson Weeks in Treatment: 7 Active Problems ICD-10 Evaluated Encounter Code Description Active Date Today Diagnosis I89.0 Lymphedema, not elsewhere classified 04/26/2018 No Yes E11.622 Type 2 diabetes mellitus with other skin ulcer 04/26/2018 No Yes L97.822 Non-pressure chronic ulcer of other part of left lower leg with 04/26/2018 No Yes fat layer exposed I10 Essential (primary) hypertension 04/26/2018 No Yes I48.2 Chronic atrial fibrillation 04/26/2018 No Yes J44.9 Chronic obstructive pulmonary disease, unspecified 04/26/2018 No Yes Inactive Problems Resolved Problems Electronic Signature(s) Signed: 06/17/2018 1:32:29 PM By: Worthy Keeler PA-C Entered By:  Worthy Keeler on 06/14/2018 13:59:23 Hamman, Suzette Battiest ([id number]) -------------------------------------------------------------------------------- Progress Note Details Patient Name: Dorothy [name]. Date of Service: 06/14/2018 1:45 PM Medical Record Number: [id number] Patient Account Number: [id number] Date of Birth/Sex: 12/06/1934 (83 y.o. F) Treating RN: Montey Hora Primary Care Provider: Rutherford Guys Other Clinician: Referring Provider: Rutherford Guys Treating Provider/Extender: Melburn Hake, Chidiebere Wynn Weeks in Treatment: 7 Subjective Chief Complaint Information obtained from Patient Left medial lower extremity ulcer History of Present Illness (HPI) 04/26/18 on evaluation today patient presents for initial evaluation and clinic is referral from the [facility] clinic. She has been experiencing a wound on her left anterior lower extremity which has been intermittently open over the past two years. This is a site where she previously did have a biopsy performed which showed some type of cancer and subsequently she ended up having a larger surgical excision to completely remove everything. It actually appears based on a scar area that may have been more medial to where we're looking at right now. With that being said she does definitely have lymphedema and I do believe that this may be more of a lymphedema lesion as compared to truly be in a cancerous lesion. Nonetheless I explained to the patient as well is her family member who was with her today that I'll be see I cannot be completely certain of that without biopsy. No other diagnostic imaging has been performed at this point. Patient does have diabetes her hemoglobin A1c most recent was 7.4% and this was on Jan 15, 2018. Upon further review of her labs from this date as well she also had an elevated creatinine and a glomerular filtration rate of 40 which was obviously low. She also did have a wound culture which was performed on 03/27/18.  This revealed Staphylococcus aureus and the patient was originally given Keflex but then changed to a stronger antibiotic I'm unsure of what the antibiotic she was changed to was. Nonetheless that seems to have resolved the issue there does not appear to be any evidence of infection at this point. Her ABI's are noncompressible. No fevers, chills, nausea, or vomiting noted at  this time. Other than the diabetes patient does have a history of lymphedema as well as chronic atrial fibrillation and COPD. 05/17/18 on evaluation today patient actually appears to be doing rather well in regard to her ulcer. In fact this has improved almost 50% since her last visit. This obviously is excellent news. With that being said I am not seeing at this point any evidence of infection which is also excellent news. She did have her arterial vascular studies. The impression was the patient did have normal [place] waveforms of the ankle bilaterally. However her right and left ABI's were noncompressible. The recommendation was should the patient feel conservative treatment consider MRA runoff to better define the site in nature of arterial occlusive disease and delineate treatment options. With that being said considering the fact that she seems to be doing better and is actually showing signs of making good progress I'm not even sure that a vascular consulate is necessary at this point. The patient in fact would like to avoid seeing any further doctors unless it becomes absolutely necessary. 05/31/18 on evaluation today the patient actually appears to be doing excellent in regard to her lower extremity ulcer at this point. She is shown signs of excellent improvement and there does not appear to be any evidence of infection at this time which is great news. Overall I have been very pleased with how she's progressing each time I see her. The wound bed definitely appears to be greatly improved from the standpoint of the overall  appearance there's no your theme is surrounding and in general I think this is the best that I've seen her when since have been taking care of her. There's no need for sharp debridement today. 06/14/18 On evaluation today patient actually appears to be doing excellent in regard to her lower extremity ulcer. She's been tolerating the dressing changes without complication and in general everything seems to be making excellent progress there just to very small almost pinpoint openings noted on her extremity at the site of the ulcer both of which are shown signs of good epithelialization. No fevers, chills, nausea, or vomiting noted at this time. Patient History Information obtained from Patient. KYLII, ENNIS ([id number]) Family History Cancer - Father,Siblings, Diabetes - Maternal Grandparents, Heart Disease - Father, Hypertension - Mother,Father,Siblings, Lung Disease - Father,Siblings, Stroke - Father, No family history of Kidney Disease, Seizures, Thyroid Problems, Tuberculosis. Social History Never smoker, Marital Status - Widowed, Alcohol Use - Never, Drug Use - No History, Caffeine Use - Daily. Review of Systems (ROS) Constitutional Symptoms (General Health) Denies complaints or symptoms of Fever, Chills. Respiratory The patient has no complaints or symptoms. Cardiovascular The patient has no complaints or symptoms. Psychiatric The patient has no complaints or symptoms. Objective Constitutional Well-nourished and well-hydrated in no acute distress. Vitals Time Taken: 1:43 PM, Height: 64 in, Weight: 173.4 lbs, BMI: 29.8, Temperature: 98.2 F, Pulse: 64 bpm, Respiratory Rate: 18 breaths/min, Blood Pressure: 142/51 mmHg. Respiratory normal breathing without difficulty. clear to auscultation bilaterally. Cardiovascular regular rate and rhythm with normal S1, S2. Psychiatric this patient is able to make decisions and demonstrates good insight into disease process. Alert and  Oriented x 3. pleasant and cooperative. General Notes: Patient's wound bed at this time shows evidence of good epithelialization there is no evidence of infection and overall I'm very pleased with how things stand. Integumentary (Hair, Skin) Wound #1 status is Open. Original cause of wound was Gradually Appeared. The wound is located on the Left,Medial  Lower Leg. The wound measures 0.1cm length x 0.1cm width x 0.1cm depth; 0.008cm^2 area and 0.001cm^3 volume. There is no tunneling or undermining noted. There is a small amount of serous drainage noted. The wound margin is indistinct and nonvisible. There is small (1-33%) red granulation within the wound bed. There is no necrotic tissue within the wound bed. The periwound skin appearance did not exhibit: Callus, Crepitus, Excoriation, Induration, Rash, Scarring, Dry/Scaly, Maceration, Atrophie Blanche, Cyanosis, Ecchymosis, Hemosiderin Staining, Mottled, Pallor, Rubor, Erythema. SARAIYAH, HEMMINGER ([id number]) Assessment Active Problems ICD-10 Lymphedema, not elsewhere classified Type 2 diabetes mellitus with other skin ulcer Non-pressure chronic ulcer of other part of left lower leg with fat layer exposed Essential (primary) hypertension Chronic atrial fibrillation Chronic obstructive pulmonary disease, unspecified Plan Wound Cleansing: Wound #1 Left,Medial Lower Leg: Clean wound with Normal Saline. May Shower, gently pat wound dry prior to applying new dressing. Anesthetic (add to Medication List): Wound #1 Left,Medial Lower Leg: Topical Lidocaine 4% cream applied to wound bed prior to debridement (In Clinic Only). Skin Barriers/Peri-Wound Care: Wound #1 Left,Medial Lower Leg: Skin Prep Primary Wound Dressing: Wound #1 Left,Medial Lower Leg: Silver Collagen - moistened with saline Secondary Dressing: Wound #1 Left,Medial Lower Leg: Boardered Foam Dressing Dressing Change Frequency: Wound #1 Left,Medial Lower Leg: Three times  weekly Follow-up Appointments: Wound #1 Left,Medial Lower Leg: Return Appointment in 2 weeks. Edema Control: Wound #1 Left,Medial Lower Leg: Elevate legs to the level of the heart and pump ankles as often as possible Additional Orders / Instructions: Wound #1 Left,Medial Lower Leg: Increase protein intake. Activity as tolerated Other: - try to keep blood sugars below 180 I'm gonna suggest currently that we continue with the above wound care measures over the next two weeks. Time. The patient is in agreement that plan. Will subtly see were things stand at follow-up. SHAMEKIA, TIPPETS F. ([id number]) Please see above for specific wound care orders. We will see patient for re-evaluation in 2 week(s) here in the clinic. If anything worsens or changes patient will contact our office for additional recommendations. Electronic Signature(s) Signed: 06/17/2018 1:32:29 PM By: Worthy Keeler PA-C Entered By: Worthy Keeler on 06/17/2018 09:54:22 Dorothy [name] ([id number]) -------------------------------------------------------------------------------- ROS/PFSH Details Patient Name: Dorothy [name]. Date of Service: 06/14/2018 1:45 PM Medical Record Number: [id number] Patient Account Number: [id number] Date of Birth/Sex: October 19, 1934 (83 y.o. F) Treating RN: Montey Hora Primary Care Provider: Rutherford Guys Other Clinician: Referring Provider: Rutherford Guys Treating Provider/Extender: Melburn Hake, Alvetta Hidrogo Weeks in Treatment: 7 Information Obtained From Patient Wound History Do you currently have one or more open woundso Yes How many open wounds do you currently haveo 1 Approximately how long have you had your woundso 2 years How have you been treating your wound(s) until nowo no dressings Has your wound(s) ever healed and then re-openedo Yes Have you had any lab work done in the past montho No Have you tested positive for an antibiotic resistant organism (MRSA, VRE)o No Have you tested positive  for osteomyelitis (bone infection)o No Have you had any tests for circulation on your legso No Have you had other problems associated with your woundso Infection Constitutional Symptoms (General Health) Complaints and Symptoms: Negative for: Fever; Chills Eyes Medical History: Positive for: Cataracts - removed left Negative for: Glaucoma; Optic Neuritis Ear/Nose/Mouth/Throat Medical History: Negative for: Chronic sinus problems/congestion; Middle ear problems Hematologic/Lymphatic Medical History: Positive for: Anemia - 2018 Negative for: Hemophilia; Human Immunodeficiency Virus; Lymphedema; Sickle Cell Disease Respiratory Complaints and Symptoms: No Complaints  or Symptoms Medical History: Positive for: Chronic Obstructive Pulmonary Disease (COPD) Negative for: Aspiration; Asthma; Pneumothorax; Sleep Apnea; Tuberculosis Cardiovascular Complaints and Symptoms: No Complaints or Symptoms MIYANNA, WIERSMA F. ([id number]) Medical History: Positive for: Arrhythmia - afib; Congestive Heart Failure; Hypertension Negative for: Coronary Artery Disease; Deep Vein Thrombosis; Hypotension; Myocardial Infarction; Peripheral Arterial Disease; Peripheral Venous Disease; Phlebitis; Vasculitis Gastrointestinal Medical History: Negative for: Cirrhosis ; Colitis; Crohnos; Hepatitis B Endocrine Medical History: Positive for: Type II Diabetes Time with diabetes: `15 years Treated with: Insulin, Oral agents Blood sugar tested every day: Yes Tested : bid Blood sugar testing results: Bedtime: 130 Genitourinary Medical History: Negative for: End Stage Renal Disease Immunological Medical History: Negative for: Lupus Erythematosus; Raynaudos; Scleroderma Integumentary (Skin) Medical History: Negative for: History of Burn; History of pressure wounds Musculoskeletal Medical History: Positive for: Osteoarthritis Negative for: Gout; Rheumatoid Arthritis Neurologic Medical History: Negative  for: Dementia; Neuropathy; Quadriplegia; Paraplegia; Seizure Disorder Psychiatric Complaints and Symptoms: No Complaints or Symptoms Medical History: Positive for: Confinement Anxiety Negative for: Anorexia/bulimia HBO Extended History Items Eyes: Cataracts Eggleton, Whitleigh F. ([id number]) Immunizations Pneumococcal Vaccine: Received Pneumococcal Vaccination: Yes Implantable Devices Family and Social History Cancer: Yes - Father,Siblings; Diabetes: Yes - Maternal Grandparents; Heart Disease: Yes - Father; Hypertension: Yes - Mother,Father,Siblings; Kidney Disease: No; Lung Disease: Yes - Father,Siblings; Seizures: No; Stroke: Yes - Father; Thyroid Problems: No; Tuberculosis: No; Never smoker; Marital Status - Widowed; Alcohol Use: Never; Drug Use: No History; Caffeine Use: Daily; Financial Concerns: No; Food, Clothing or Shelter Needs: No; Support System Lacking: No; Transportation Concerns: No; Advanced Directives: No; Patient does not want information on Advanced Directives; Do not resuscitate: No; Living Will: No; Medical Power of Attorney: No Physician Affirmation I have reviewed and agree with the above information. Electronic Signature(s) Signed: 06/17/2018 1:32:29 PM By: Worthy Keeler PA-C Signed: 06/17/2018 4:37:12 PM By: Montey Hora Entered By: Worthy Keeler on 06/17/2018 09:53:54 Dumont, Suzette Battiest ([id number]) -------------------------------------------------------------------------------- SuperBill Details Patient Name: Dorothy [name]. Date of Service: 06/14/2018 Medical Record Number: [id number] Patient Account Number: [id number] Date of Birth/Sex: [birth year] (83 y.o. F) Treating RN: Montey Hora Primary Care Provider: Rutherford Guys Other Clinician: Referring Provider: Rutherford Guys Treating Provider/Extender: Melburn Hake, Airyonna Franklyn Weeks in Treatment: 7 Diagnosis Coding ICD-10 Codes Code Description I89.0 Lymphedema, not elsewhere classified E11.622 Type 2 diabetes  mellitus with other skin ulcer L97.822 Non-pressure chronic ulcer of other part of left lower leg with fat layer exposed I10 Essential (primary) hypertension I48.2 Chronic atrial fibrillation J44.9 Chronic obstructive pulmonary disease, unspecified Facility Procedures CPT4 Code: [id number] Description: 99213 - WOUND CARE VISIT-LEV 3 EST PT Modifier: Quantity: 1 Physician Procedures CPT4 Code Description: [id number] 03888 - WC PHYS LEVEL 3 - EST PT ICD-10 Diagnosis Description I89.0 Lymphedema, not elsewhere classified E11.622 Type 2 diabetes mellitus with other skin ulcer L97.822 Non-pressure chronic ulcer of other part of left lower  leg wit I10 Essential (primary) hypertension Modifier: h fat layer expos Quantity: 1 ed Electronic Signature(s) Signed: 06/17/2018 1:32:29 PM By: Worthy Keeler PA-C Entered By: Worthy Keeler on 06/17/2018 09:54:38

## 2018-06-28 ENCOUNTER — Encounter: Payer: Medicare Other | Admitting: Physician Assistant

## 2018-06-28 DIAGNOSIS — E11622 Type 2 diabetes mellitus with other skin ulcer: Secondary | ICD-10-CM | POA: Diagnosis not present

## 2018-07-05 NOTE — Progress Notes (Signed)
Dorothy [name], Dorothy [name] ([id number]) Visit Report for 06/28/2018 Arrival Information Details Patient Name: Dorothy [name], Dorothy [name]. Date of Service: 06/28/2018 2:15 PM Medical Record Number: [id number] Patient Account Number: [id number] Date of Birth/Sex: May 16, 1935 (83 y.o. F) Treating RN: Montey Hora Primary Care Venesa Semidey: Rutherford Guys Other Clinician: Referring Roseline Ebarb: Rutherford Guys Treating Savier Trickett/Extender: Melburn Hake, HOYT Weeks in Treatment: 9 Visit Information History Since Last Visit Added or deleted any medications: No Patient Arrived: Ambulatory Any new allergies or adverse reactions: No Arrival Time: 14:34 Had a fall or experienced change in No Accompanied By: granddaughter activities of daily living that may affect Transfer Assistance: None risk of falls: Patient Identification Verified: Yes Signs or symptoms of abuse/neglect since last visito No Secondary Verification Process Yes Implantable device outside of the clinic excluding No Completed: cellular tissue based products placed in the center Patient Has Alerts: Yes since last visit: Patient Alerts: Patient on Blood Has Dressing in Place as Prescribed: Yes Thinner Pain Present Now: No Eliquis DMII Electronic Signature(s) Signed: 06/28/2018 5:05:02 PM By: Lorine Bears RCP, RRT, CHT Entered By: Lorine Bears on 06/28/2018 14:34:56 Dorothy [name] ([id number]) -------------------------------------------------------------------------------- Clinic Level of Care Assessment Details Patient Name: Dorothy [name]. Date of Service: 06/28/2018 2:15 PM Medical Record Number: [id number] Patient Account Number: [id number] Date of Birth/Sex: [birth year] (83 y.o. F) Treating RN: Montey Hora Primary Care Wynn Kernes: Rutherford Guys Other Clinician: Referring Neziah Braley: Rutherford Guys Treating Eddi Hymes/Extender: Melburn Hake, HOYT Weeks in Treatment: 9 Clinic Level of Care Assessment Items TOOL 4 Quantity  Score []  - Use when only an EandM is performed on FOLLOW-UP visit 0 ASSESSMENTS - Nursing Assessment / Reassessment X - Reassessment of Co-morbidities (includes updates in patient status) 1 10 X- 1 5 Reassessment of Adherence to Treatment Plan ASSESSMENTS - Wound and Skin Assessment / Reassessment X - Simple Wound Assessment / Reassessment - one wound 1 5 []  - 0 Complex Wound Assessment / Reassessment - multiple wounds []  - 0 Dermatologic / Skin Assessment (not related to wound area) ASSESSMENTS - Focused Assessment X - Circumferential Edema Measurements - multi extremities 1 5 []  - 0 Nutritional Assessment / Counseling / Intervention X- 1 5 Lower Extremity Assessment (monofilament, tuning fork, pulses) []  - 0 Peripheral Arterial Disease Assessment (using hand held doppler) ASSESSMENTS - Ostomy and/or Continence Assessment and Care []  - Incontinence Assessment and Management 0 []  - 0 Ostomy Care Assessment and Management (repouching, etc.) PROCESS - Coordination of Care X - Simple Patient / Family Education for ongoing care 1 15 []  - 0 Complex (extensive) Patient / Family Education for ongoing care []  - 0 Staff obtains Programmer, systems, Records, Test Results / Process Orders []  - 0 Staff telephones HHA, Nursing Homes / Clarify orders / etc []  - 0 Routine Transfer to another Facility (non-emergent condition) []  - 0 Routine Hospital Admission (non-emergent condition) []  - 0 New Admissions / Biomedical engineer / Ordering NPWT, Apligraf, etc. []  - 0 Emergency Hospital Admission (emergent condition) X- 1 10 Simple Discharge Coordination APPHIA, [place]. ([id number]) []  - 0 Complex (extensive) Discharge Coordination PROCESS - Special Needs []  - Pediatric / Minor Patient Management 0 []  - 0 Isolation Patient Management []  - 0 Hearing / Language / Visual special needs []  - 0 Assessment of Community assistance (transportation, D/C planning, etc.) []  - 0 Additional  assistance / Altered mentation []  - 0 Support Surface(s) Assessment (bed, cushion, seat, etc.) INTERVENTIONS - Wound Cleansing / Measurement X - Simple Wound Cleansing - one wound 1 5 []  -  0 Complex Wound Cleansing - multiple wounds X- 1 5 Wound Imaging (photographs - any number of wounds) []  - 0 Wound Tracing (instead of photographs) X- 1 5 Simple Wound Measurement - one wound []  - 0 Complex Wound Measurement - multiple wounds INTERVENTIONS - Wound Dressings []  - Small Wound Dressing one or multiple wounds 0 []  - 0 Medium Wound Dressing one or multiple wounds []  - 0 Large Wound Dressing one or multiple wounds []  - 0 Application of Medications - topical []  - 0 Application of Medications - injection INTERVENTIONS - Miscellaneous []  - External ear exam 0 []  - 0 Specimen Collection (cultures, biopsies, blood, body fluids, etc.) []  - 0 Specimen(s) / Culture(s) sent or taken to Lab for analysis []  - 0 Patient Transfer (multiple staff / Civil Service fast streamer / Similar devices) []  - 0 Simple Staple / Suture removal (25 or less) []  - 0 Complex Staple / Suture removal (26 or more) []  - 0 Hypo / Hyperglycemic Management (close monitor of Blood Glucose) []  - 0 Ankle / Brachial Index (ABI) - do not check if billed separately X- 1 5 Vital Signs [name], [name] ([id number]) Has the patient been seen at the hospital within the last three years: Yes Total Score: 75 Level Of Care: New/Established - Level 2 Electronic Signature(s) Signed: 06/28/2018 5:23:22 PM By: Montey Hora Entered By: Montey Hora on 06/28/2018 16:15:12 Dorothy [name] ([id number]) -------------------------------------------------------------------------------- Encounter Discharge Information Details Patient Name: Dorothy [name]. Date of Service: 06/28/2018 2:15 PM Medical Record Number: [id number] Patient Account Number: [id number] Date of Birth/Sex: 1934-10-23 (83 y.o. F) Treating RN: Montey Hora Primary  Care Young Mulvey: Rutherford Guys Other Clinician: Referring Verona Hartshorn: Rutherford Guys Treating Lyden Redner/Extender: Melburn Hake, HOYT Weeks in Treatment: 9 Encounter Discharge Information Items Discharge Condition: Stable Ambulatory Status: Walker Discharge Destination: Home Transportation: Private Auto Accompanied By: granddaughter Schedule Follow-up Appointment: No Clinical Summary of Care: Electronic Signature(s) Signed: 06/28/2018 4:15:48 PM By: Montey Hora Entered By: Montey Hora on 06/28/2018 16:15:48 Dorothy [name] ([id number]) -------------------------------------------------------------------------------- Lower Extremity Assessment Details Patient Name: Dorothy [name]. Date of Service: 06/28/2018 2:15 PM Medical Record Number: [id number] Patient Account Number: [id number] Date of Birth/Sex: Aug 02, 1935 (83 y.o. F) Treating RN: Cornell Barman Primary Care Erikka Follmer: Rutherford Guys Other Clinician: Referring Norelle Runnion: Rutherford Guys Treating Faline Langer/Extender: Melburn Hake, HOYT Weeks in Treatment: 9 Vascular Assessment Pulses: Dorsalis Pedis Palpable: [Left:Yes] Posterior Tibial Extremity colors, hair growth, and conditions: Extremity Color: [Left:Normal] Hair Growth on Extremity: [Left:Yes] Temperature of Extremity: [Left:Warm] Capillary Refill: [Left:< 3 seconds] Toe Nail Assessment Left: Right: Thick: Yes Discolored: No Deformed: No Improper Length and Hygiene: No Electronic Signature(s) Signed: 06/28/2018 5:38:54 PM By: Gretta Cool, BSN, RN, CWS, Kim RN, BSN Entered By: Gretta Cool, BSN, RN, CWS, Kim on 06/28/2018 14:37:33 Dorothy [name] ([id number]) -------------------------------------------------------------------------------- Barry Details Patient Name: Dorothy [name]. Date of Service: 06/28/2018 2:15 PM Medical Record Number: [id number] Patient Account Number: [id number] Date of Birth/Sex: 1935-08-15 (83 y.o. F) Treating RN: Montey Hora Primary  Care Eliza Grissinger: Rutherford Guys Other Clinician: Referring Jamorris Ndiaye: Rutherford Guys Treating Verlaine Embry/Extender: Melburn Hake, HOYT Weeks in Treatment: 9 Active Inactive Electronic Signature(s) Signed: 06/28/2018 3:53:25 PM By: Montey Hora Previous Signature: 06/28/2018 3:52:45 PM Version By: Montey Hora Entered By: Montey Hora on 06/28/2018 15:53:25 Dorothy [name] ([id number]) -------------------------------------------------------------------------------- Pain Assessment Details Patient Name: Dorothy [name]. Date of Service: 06/28/2018 2:15 PM Medical Record Number: [id number] Patient Account Number: [id number] Date of Birth/Sex: 1935-05-13 (83 y.o. F) Treating RN: Montey Hora Primary Care Adyen Bifulco:  BENDER, ABBY Other Clinician: Referring Sonal Dorwart: BENDER, ABBY Treating Timmie Calix/Extender: STONE III, HOYT Weeks in Treatment: 9 Active Problems Location of Pain Severity and Description of Pain Patient Has Paino No Site Locations Pain Management and Medication Current Pain Management: Electronic Signature(s) Signed: 06/28/2018 5:05:02 PM By: Lorine Bears RCP, RRT, CHT Signed: 06/28/2018 5:23:22 PM By: Montey Hora Entered By: Lorine Bears on 06/28/2018 14:35:02 Dorothy [name] ([id number]) -------------------------------------------------------------------------------- Patient/Caregiver Education Details Patient Name: Dorothy [name]. Date of Service: 06/28/2018 2:15 PM Medical Record Number: [id number] Patient Account Number: [id number] Date of Birth/Gender: [birth year] (83 y.o. F) Treating RN: Montey Hora Primary Care Physician: Rutherford Guys Other Clinician: Referring Physician: Rutherford Guys Treating Physician/Extender: Sharalyn Ink in Treatment: 9 Education Assessment Education Provided To: Patient and Caregiver Education Topics Provided Basic Hygiene: Handouts: Other: care of newly healed ulcer site Methods:  Explain/Verbal Responses: State content correctly Electronic Signature(s) Signed: 06/28/2018 5:23:22 PM By: Montey Hora Entered By: Montey Hora on 06/28/2018 16:15:32 Dorothy [name] ([id number]) -------------------------------------------------------------------------------- Wound Assessment Details Patient Name: Dorothy [name]. Date of Service: 06/28/2018 2:15 PM Medical Record Number: [id number] Patient Account Number: [id number] Date of Birth/Sex: [birth year] (83 y.o. F) Treating RN: Cornell Barman Primary Care Slyvia Lartigue: Rutherford Guys Other Clinician: Referring Avery Eustice: Rutherford Guys Treating Myelle Poteat/Extender: Melburn Hake, HOYT Weeks in Treatment: 9 Wound Status Wound Number: 1 Primary Diabetic Wound/Ulcer of the Lower Etiology: Extremity Wound Location: Left, Medial Lower Leg Wound Status: Healed - Epithelialized Wounding Event: Gradually Appeared Date Acquired: 04/26/2016 Weeks Of Treatment: 9 Clustered Wound: No Photos Photo Uploaded By: Gretta Cool, BSN, RN, CWS, Kim on 06/28/2018 17:32:00 Wound Measurements Length: (cm) 0 % Redu Width: (cm) 0 % Redu Depth: (cm) 0 Area: (cm) 0 Volume: (cm) 0 ction in Area: 100% ction in Volume: 100% Wound Description Classification: Grade 2 Periwound Skin Texture Texture Color No Abnormalities Noted: No No Abnormalities Noted: No Moisture No Abnormalities Noted: No Electronic Signature(s) Signed: 06/28/2018 5:38:54 PM By: Gretta Cool, BSN, RN, CWS, Kim RN, BSN Entered By: Gretta Cool, BSN, RN, CWS, Kim on 06/28/2018 14:37:03 Dorothy [name] ([id number]) -------------------------------------------------------------------------------- Vitals Details Patient Name: Dorothy [name]. Date of Service: 06/28/2018 2:15 PM Medical Record Number: [id number] Patient Account Number: [id number] Date of Birth/Sex: Jan 14, 1935 (83 y.o. F) Treating RN: Montey Hora Primary Care Yassir Enis: Rutherford Guys Other Clinician: Referring Seraiah Nowack: Rutherford Guys Treating Lexine Jaspers/Extender: STONE III, HOYT Weeks in Treatment: 9 Vital Signs Time Taken: 14:35 Reference Range: 80 - 120 mg / dl Height (in): 64 Weight (lbs): 173.4 Body Mass Index (BMI): 29.8 Electronic Signature(s) Signed: 06/28/2018 5:05:02 PM By: Lorine Bears RCP, RRT, CHT Entered By: Lorine Bears on 06/28/2018 14:35:55

## 2018-07-05 NOTE — Progress Notes (Signed)
CHANIA, KOCHANSKI ([id number]) Visit Report for 06/28/2018 Chief Complaint Document Details Patient Name: Dorothy [name], Dorothy [name]. Date of Service: 06/28/2018 2:15 PM Medical Record Number: [id number] Patient Account Number: [id number] Date of Birth/Sex: 1935/09/04 (83 y.o. F) Treating RN: Montey Hora Primary Care Provider: Rutherford Guys Other Clinician: Referring Provider: Rutherford Guys Treating Provider/Extender: Melburn Hake, HOYT Weeks in Treatment: 9 Information Obtained from: Patient Chief Complaint Left medial lower extremity ulcer Electronic Signature(s) Signed: 07/04/2018 1:45:00 AM By: Worthy Keeler PA-C Entered By: Worthy Keeler on 06/28/2018 14:56:33 Lindeman, Suzette Battiest ([id number]) -------------------------------------------------------------------------------- HPI Details Patient Name: [name]. Date of Service: 06/28/2018 2:15 PM Medical Record Number: [id number] Patient Account Number: [id number] Date of Birth/Sex: [birth year] (83 y.o. F) Treating RN: Montey Hora Primary Care Provider: Rutherford Guys Other Clinician: Referring Provider: Rutherford Guys Treating Provider/Extender: Melburn Hake, HOYT Weeks in Treatment: 9 History of Present Illness HPI Description: 04/26/18 on evaluation today patient presents for initial evaluation and clinic is referral from the [facility] clinic. She has been experiencing a wound on her left anterior lower extremity which has been intermittently open over the past two years. This is a site where she previously did have a biopsy performed which showed some type of cancer and subsequently she ended up having a larger surgical excision to completely remove everything. It actually appears based on a scar area that may have been more medial to where we're looking at right now. With that being said she does definitely have lymphedema and I do believe that this may be more of a lymphedema lesion as compared to truly be in a cancerous  lesion. Nonetheless I explained to the patient as well is her family member who was with her today that I'll be see I cannot be completely certain of that without biopsy. No other diagnostic imaging has been performed at this point. Patient does have diabetes her hemoglobin A1c most recent was 7.4% and this was on Jan 15, 2018. Upon further review of her labs from this date as well she also had an elevated creatinine and a glomerular filtration rate of 40 which was obviously low. She also did have a wound culture which was performed on 03/27/18. This revealed Staphylococcus aureus and the patient was originally given Keflex but then changed to a stronger antibiotic I'm unsure of what the antibiotic she was changed to was. Nonetheless that seems to have resolved the issue there does not appear to be any evidence of infection at this point. Her ABI's are noncompressible. No fevers, chills, nausea, or vomiting noted at this time. Other than the diabetes patient does have a history of lymphedema as well as chronic atrial fibrillation and COPD. 05/17/18 on evaluation today patient actually appears to be doing rather well in regard to her ulcer. In fact this has improved almost 50% since her last visit. This obviously is excellent news. With that being said I am not seeing at this point any evidence of infection which is also excellent news. She did have her arterial vascular studies. The impression was the patient did have normal [place] waveforms of the ankle bilaterally. However her right and left ABI's were noncompressible. The recommendation was should the patient feel conservative treatment consider MRA runoff to better define the site in nature of arterial occlusive disease and delineate treatment options. With that being said considering the fact that she seems to be doing better and is actually showing signs of making good progress I'm not even sure that a vascular  consulate is necessary at this  point. The patient in fact would like to avoid seeing any further doctors unless it becomes absolutely necessary. 05/31/18 on evaluation today the patient actually appears to be doing excellent in regard to her lower extremity ulcer at this point. She is shown signs of excellent improvement and there does not appear to be any evidence of infection at this time which is great news. Overall I have been very pleased with how she's progressing each time I see her. The wound bed definitely appears to be greatly improved from the standpoint of the overall appearance there's no your theme is surrounding and in general I think this is the best that I've seen her when since have been taking care of her. There's no need for sharp debridement today. 06/14/18 On evaluation today patient actually appears to be doing excellent in regard to her lower extremity ulcer. She's been tolerating the dressing changes without complication and in general everything seems to be making excellent progress there just to very small almost pinpoint openings noted on her extremity at the site of the ulcer both of which are shown signs of good epithelialization. No fevers, chills, nausea, or vomiting noted at this time. 06/28/18 on evaluation today patient actually appears to be doing very well in regard to her lower extremity ulcer this appears to be completely healed. Fortunately there's no evidence of infection at this time. She did get the [place] 4000 which she is actually using but just did so once I think this will be helpful and if she likes it I think it would be good to get the second one so she can have one for both legs. Electronic Signature(s) Signed: 07/04/2018 1:45:00 AM By: Worthy Keeler PA-C Entered By: Worthy Keeler on 06/28/2018 15:20:02 [name] ([id number] Rose, Suzette Battiest ([id number]) -------------------------------------------------------------------------------- Physical Exam  Details Patient Name: MARICA, TRENTHAM. Date of Service: 06/28/2018 2:15 PM Medical Record Number: [id number] Patient Account Number: [id number] Date of Birth/Sex: December 16, 1934 (83 y.o. F) Treating RN: Montey Hora Primary Care Provider: Rutherford Guys Other Clinician: Referring Provider: Rutherford Guys Treating Provider/Extender: STONE III, HOYT Weeks in Treatment: 9 Constitutional Well-nourished and well-hydrated in no acute distress. Respiratory normal breathing without difficulty. Psychiatric this patient is able to make decisions and demonstrates good insight into disease process. Alert and Oriented x 3. pleasant and cooperative. Notes Patient's wound bed currently shows evidence of complete epithelialization which is great news there's no opening at this time fortunately everything seems to be doing great. Electronic Signature(s) Signed: 07/04/2018 1:45:00 AM By: Worthy Keeler PA-C Entered By: Worthy Keeler on 06/28/2018 15:20:28 [name] ([id number]) -------------------------------------------------------------------------------- Physician Orders Details Patient Name: [name]. Date of Service: 06/28/2018 2:15 PM Medical Record Number: [id number] Patient Account Number: [id number] Date of Birth/Sex: 06-13-1935 (83 y.o. F) Treating RN: Montey Hora Primary Care Provider: Rutherford Guys Other Clinician: Referring Provider: Rutherford Guys Treating Provider/Extender: Melburn Hake, HOYT Weeks in Treatment: 9 Verbal / Phone Orders: No Diagnosis Coding ICD-10 Coding Code Description I89.0 Lymphedema, not elsewhere classified E11.622 Type 2 diabetes mellitus with other skin ulcer L97.822 Non-pressure chronic ulcer of other part of left lower leg with fat layer exposed I10 Essential (primary) hypertension I48.2 Chronic atrial fibrillation J44.9 Chronic obstructive pulmonary disease, unspecified Discharge From [facility] o Discharge from [place] Signature(s) Signed: 06/28/2018 4:14:54 PM By: Montey Hora Signed: 07/04/2018 1:45:00 AM By: Worthy Keeler PA-C Entered By: Montey Hora on 06/28/2018  16:14:54 NALIA, HONEYCUTT ([id number]) -------------------------------------------------------------------------------- Problem List Details Patient Name: GISELE, PACK. Date of Service: 06/28/2018 2:15 PM Medical Record Number: [id number] Patient Account Number: [id number] Date of Birth/Sex: 18-Mar-1935 (83 y.o. F) Treating RN: Montey Hora Primary Care Provider: Rutherford Guys Other Clinician: Referring Provider: Rutherford Guys Treating Provider/Extender: Melburn Hake, HOYT Weeks in Treatment: 9 Active Problems ICD-10 Evaluated Encounter Code Description Active Date Today Diagnosis I89.0 Lymphedema, not elsewhere classified 04/26/2018 No Yes E11.622 Type 2 diabetes mellitus with other skin ulcer 04/26/2018 No Yes L97.822 Non-pressure chronic ulcer of other part of left lower leg with 04/26/2018 No Yes fat layer exposed I10 Essential (primary) hypertension 04/26/2018 No Yes I48.2 Chronic atrial fibrillation 04/26/2018 No Yes J44.9 Chronic obstructive pulmonary disease, unspecified 04/26/2018 No Yes Inactive Problems Resolved Problems Electronic Signature(s) Signed: 07/04/2018 1:45:00 AM By: Worthy Keeler PA-C Entered By: Worthy Keeler on 06/28/2018 14:56:27 Tayag, Suzette Battiest ([id number]) -------------------------------------------------------------------------------- Progress Note Details Patient Name: [name]. Date of Service: 06/28/2018 2:15 PM Medical Record Number: [id number] Patient Account Number: [id number] Date of Birth/Sex: 08-Nov-1934 (83 y.o. F) Treating RN: Montey Hora Primary Care Provider: Rutherford Guys Other Clinician: Referring Provider: Rutherford Guys Treating Provider/Extender: Melburn Hake, HOYT Weeks in Treatment: 9 Subjective Chief Complaint Information obtained from  Patient Left medial lower extremity ulcer History of Present Illness (HPI) 04/26/18 on evaluation today patient presents for initial evaluation and clinic is referral from the [facility] clinic. She has been experiencing a wound on her left anterior lower extremity which has been intermittently open over the past two years. This is a site where she previously did have a biopsy performed which showed some type of cancer and subsequently she ended up having a larger surgical excision to completely remove everything. It actually appears based on a scar area that may have been more medial to where we're looking at right now. With that being said she does definitely have lymphedema and I do believe that this may be more of a lymphedema lesion as compared to truly be in a cancerous lesion. Nonetheless I explained to the patient as well is her family member who was with her today that I'll be see I cannot be completely certain of that without biopsy. No other diagnostic imaging has been performed at this point. Patient does have diabetes her hemoglobin A1c most recent was 7.4% and this was on Jan 15, 2018. Upon further review of her labs from this date as well she also had an elevated creatinine and a glomerular filtration rate of 40 which was obviously low. She also did have a wound culture which was performed on 03/27/18. This revealed Staphylococcus aureus and the patient was originally given Keflex but then changed to a stronger antibiotic I'm unsure of what the antibiotic she was changed to was. Nonetheless that seems to have resolved the issue there does not appear to be any evidence of infection at this point. Her ABI's are noncompressible. No fevers, chills, nausea, or vomiting noted at this time. Other than the diabetes patient does have a history of lymphedema as well as chronic atrial fibrillation and COPD. 05/17/18 on evaluation today patient actually appears to be doing rather well in regard to  her ulcer. In fact this has improved almost 50% since her last visit. This obviously is excellent news. With that being said I am not seeing at this point any evidence of infection which is also excellent news. She did have her arterial vascular studies. The impression was the  patient did have normal [place] waveforms of the ankle bilaterally. However her right and left ABI's were noncompressible. The recommendation was should the patient feel conservative treatment consider MRA runoff to better define the site in nature of arterial occlusive disease and delineate treatment options. With that being said considering the fact that she seems to be doing better and is actually showing signs of making good progress I'm not even sure that a vascular consulate is necessary at this point. The patient in fact would like to avoid seeing any further doctors unless it becomes absolutely necessary. 05/31/18 on evaluation today the patient actually appears to be doing excellent in regard to her lower extremity ulcer at this point. She is shown signs of excellent improvement and there does not appear to be any evidence of infection at this time which is great news. Overall I have been very pleased with how she's progressing each time I see her. The wound bed definitely appears to be greatly improved from the standpoint of the overall appearance there's no your theme is surrounding and in general I think this is the best that I've seen her when since have been taking care of her. There's no need for sharp debridement today. 06/14/18 On evaluation today patient actually appears to be doing excellent in regard to her lower extremity ulcer. She's been tolerating the dressing changes without complication and in general everything seems to be making excellent progress there just to very small almost pinpoint openings noted on her extremity at the site of the ulcer both of which are shown signs of good epithelialization.  No fevers, chills, nausea, or vomiting noted at this time. 06/28/18 on evaluation today patient actually appears to be doing very well in regard to her lower extremity ulcer this appears to be completely healed. Fortunately there's no evidence of infection at this time. She did get the [place] 4000 which she is actually using but just did so once I think this will be helpful and if she likes it I think it would be good to get the second one so she can have one for both legs. MELDA, MERMELSTEIN ([id number]) Patient History Information obtained from Patient. Family History Cancer - Father,Siblings, Diabetes - Maternal Grandparents, Heart Disease - Father, Hypertension - Mother,Father,Siblings, Lung Disease - Father,Siblings, Stroke - Father, No family history of Kidney Disease, Seizures, Thyroid Problems, Tuberculosis. Social History Never smoker, Marital Status - Widowed, Alcohol Use - Never, Drug Use - No History, Caffeine Use - Daily. Review of Systems (ROS) Constitutional Symptoms (General Health) Denies complaints or symptoms of Fever, Chills, Marked Weight Change. Psychiatric The patient has no complaints or symptoms. Objective Constitutional Well-nourished and well-hydrated in no acute distress. Vitals Time Taken: 2:35 PM, Height: 64 in, Weight: 173.4 lbs, BMI: 29.8. Respiratory normal breathing without difficulty. Psychiatric this patient is able to make decisions and demonstrates good insight into disease process. Alert and Oriented x 3. pleasant and cooperative. General Notes: Patient's wound bed currently shows evidence of complete epithelialization which is great news there's no opening at this time fortunately everything seems to be doing great. Integumentary (Hair, Skin) Wound #1 status is Healed - Epithelialized. Original cause of wound was Gradually Appeared. The wound is located on the Left,Medial Lower Leg. The wound measures 0cm length x 0cm width x 0cm depth; 0cm^2  area and 0cm^3 volume. Assessment Active Problems ICD-10 Lymphedema, not elsewhere classified JOLISSA, KAPRAL. ([id number]) Type 2 diabetes mellitus with other skin ulcer Non-pressure chronic ulcer of other  part of left lower leg with fat layer exposed Essential (primary) hypertension Chronic atrial fibrillation Chronic obstructive pulmonary disease, unspecified Plan Discharge From [facility]: Discharge from [place] I recommended that she use the [place] 4000 for both legs in order to help with prevention of reoccurrence in regard to her ulcer. Patient's granddaughter who was with her today states that there gonna give this a try she may have to keep this on for two days at a time but I think that would be okay as well. If anything changes in the meantime they will contact the office and let us know. Electronic Signature(s) Signed: 07/04/2018 1:45:00 AM By: Worthy Keeler PA-C Entered By: Worthy Keeler on 07/04/2018 00:42:37 [name] ([id number]) -------------------------------------------------------------------------------- ROS/PFSH Details Patient Name: [name]. Date of Service: 06/28/2018 2:15 PM Medical Record Number: [id number] Patient Account Number: [id number] Date of Birth/Sex: 1935-06-11 (83 y.o. F) Treating RN: Montey Hora Primary Care Provider: Rutherford Guys Other Clinician: Referring Provider: Rutherford Guys Treating Provider/Extender: Melburn Hake, HOYT Weeks in Treatment: 9 Information Obtained From Patient Wound History Do you currently have one or more open woundso Yes How many open wounds do you currently haveo 1 Approximately how long have you had your woundso 2 years How have you been treating your wound(s) until nowo no dressings Has your wound(s) ever healed and then re-openedo Yes Have you had any lab work done in the past montho No Have you tested positive for an antibiotic resistant organism (MRSA, VRE)o No Have you  tested positive for osteomyelitis (bone infection)o No Have you had any tests for circulation on your legso No Have you had other problems associated with your woundso Infection Constitutional Symptoms (General Health) Complaints and Symptoms: Negative for: Fever; Chills; Marked Weight Change Eyes Medical History: Positive for: Cataracts - removed left Negative for: Glaucoma; Optic Neuritis Ear/Nose/Mouth/Throat Medical History: Negative for: Chronic sinus problems/congestion; Middle ear problems Hematologic/Lymphatic Medical History: Positive for: Anemia - 2018 Negative for: Hemophilia; Human Immunodeficiency Virus; Lymphedema; Sickle Cell Disease Respiratory Medical History: Positive for: Chronic Obstructive Pulmonary Disease (COPD) Negative for: Aspiration; Asthma; Pneumothorax; Sleep Apnea; Tuberculosis Cardiovascular Medical History: Positive for: Arrhythmia - afib; Congestive Heart Failure; Hypertension Negative for: Coronary Artery Disease; Deep Vein Thrombosis; Hypotension; Myocardial Infarction; Peripheral Arterial Disease; Peripheral Venous Disease; Phlebitis; Vasculitis ZIARA, THELANDER. ([id number]) Gastrointestinal Medical History: Negative for: Cirrhosis ; Colitis; Crohnos; Hepatitis B Endocrine Medical History: Positive for: Type II Diabetes Time with diabetes: `15 years Treated with: Insulin, Oral agents Blood sugar tested every day: Yes Tested : bid Blood sugar testing results: Bedtime: 130 Genitourinary Medical History: Negative for: End Stage Renal Disease Immunological Medical History: Negative for: Lupus Erythematosus; Raynaudos; Scleroderma Integumentary (Skin) Medical History: Negative for: History of Burn; History of pressure wounds Musculoskeletal Medical History: Positive for: Osteoarthritis Negative for: Gout; Rheumatoid Arthritis Neurologic Medical History: Negative for: Dementia; Neuropathy; Quadriplegia; Paraplegia; Seizure  Disorder Psychiatric Complaints and Symptoms: No Complaints or Symptoms Medical History: Positive for: Confinement Anxiety Negative for: Anorexia/bulimia HBO Extended History Items Eyes: Cataracts Immunizations Pneumococcal Vaccine: Received Pneumococcal Vaccination: CARLEEN, RHUE ([id number]) Implantable Devices Family and Social History Cancer: Yes - Father,Siblings; Diabetes: Yes - Maternal Grandparents; Heart Disease: Yes - Father; Hypertension: Yes - Mother,Father,Siblings; Kidney Disease: No; Lung Disease: Yes - Father,Siblings; Seizures: No; Stroke: Yes - Father; Thyroid Problems: No; Tuberculosis: No; Never smoker; Marital Status - Widowed; Alcohol Use: Never; Drug Use: No History; Caffeine Use: Daily; Financial Concerns: No; Food, Games developer or Shelter  Needs: No; Support System Lacking: No; Transportation Concerns: No; Advanced Directives: No; Patient does not want information on Advanced Directives; Do not resuscitate: No; Living Will: No; Medical Power of Attorney: No Physician Affirmation I have reviewed and agree with the above information. Electronic Signature(s) Signed: 06/28/2018 5:23:22 PM By: Montey Hora Signed: 07/04/2018 1:45:00 AM By: Worthy Keeler PA-C Entered By: Worthy Keeler on 06/28/2018 15:20:14 [name] ([id number]) -------------------------------------------------------------------------------- SuperBill Details Patient Name: [name]. Date of Service: 06/28/2018 Medical Record Number: [id number] Patient Account Number: [id number] Date of Birth/Sex: 1935/04/05 (83 y.o. F) Treating RN: Montey Hora Primary Care Provider: Rutherford Guys Other Clinician: Referring Provider: Rutherford Guys Treating Provider/Extender: Melburn Hake, HOYT Weeks in Treatment: 9 Diagnosis Coding ICD-10 Codes Code Description I89.0 Lymphedema, not elsewhere classified E11.622 Type 2 diabetes mellitus with other skin ulcer L97.822 Non-pressure  chronic ulcer of other part of left lower leg with fat layer exposed I10 Essential (primary) hypertension I48.2 Chronic atrial fibrillation J44.9 Chronic obstructive pulmonary disease, unspecified Facility Procedures CPT4 Code: [id number] Description: 47654 - WOUND CARE VISIT-LEV 2 EST PT Modifier: Quantity: 1 Physician Procedures CPT4 Code Description: [id number] - WC PHYS LEVEL 2 - EST PT ICD-10 Diagnosis Description I89.0 Lymphedema, not elsewhere classified E11.622 Type 2 diabetes mellitus with other skin ulcer L97.822 Non-pressure chronic ulcer of other part of left lower  leg wit I10 Essential (primary) hypertension Modifier: h fat layer expos Quantity: 1 ed Electronic Signature(s) Signed: 07/01/2018 9:44:06 AM By: Sharon Mt Signed: 07/04/2018 1:45:00 AM By: Worthy Keeler PA-C Entered By: Sharon Mt on 07/01/2018 09:44:06

## 2018-08-23 ENCOUNTER — Inpatient Hospital Stay: Payer: Medicare Other | Attending: Oncology

## 2018-08-23 DIAGNOSIS — D519 Vitamin B12 deficiency anemia, unspecified: Secondary | ICD-10-CM | POA: Insufficient documentation

## 2018-08-23 DIAGNOSIS — D509 Iron deficiency anemia, unspecified: Secondary | ICD-10-CM | POA: Diagnosis not present

## 2018-08-23 LAB — CBC WITH DIFFERENTIAL/PLATELET
Abs Immature Granulocytes: 0.03 10*3/uL (ref 0.00–0.07)
BASOS PCT: 0 %
Basophils Absolute: 0 10*3/uL (ref 0.0–0.1)
EOS PCT: 1 %
Eosinophils Absolute: 0.1 10*3/uL (ref 0.0–0.5)
HCT: 36.3 % (ref 36.0–46.0)
HEMOGLOBIN: 11.1 g/dL — AB (ref 12.0–15.0)
Immature Granulocytes: 0 %
Lymphocytes Relative: 24 %
Lymphs Abs: 1.6 10*3/uL (ref 0.7–4.0)
MCH: 27.6 pg (ref 26.0–34.0)
MCHC: 30.6 g/dL (ref 30.0–36.0)
MCV: 90.3 fL (ref 80.0–100.0)
MONO ABS: 0.5 10*3/uL (ref 0.1–1.0)
Monocytes Relative: 7 %
Neutro Abs: 4.7 10*3/uL (ref 1.7–7.7)
Neutrophils Relative %: 68 %
Platelets: 224 10*3/uL (ref 150–400)
RBC: 4.02 MIL/uL (ref 3.87–5.11)
RDW: 13.7 % (ref 11.5–15.5)
WBC: 7 10*3/uL (ref 4.0–10.5)
nRBC: 0 % (ref 0.0–0.2)

## 2018-08-23 LAB — IRON AND TIBC
Iron: 47 ug/dL (ref 28–170)
SATURATION RATIOS: 14 % (ref 10.4–31.8)
TIBC: 334 ug/dL (ref 250–450)
UIBC: 287 ug/dL

## 2018-08-23 LAB — FERRITIN: Ferritin: 41 ng/mL (ref 11–307)

## 2018-11-22 ENCOUNTER — Other Ambulatory Visit: Payer: Medicare Other

## 2018-11-22 ENCOUNTER — Ambulatory Visit: Payer: Medicare Other | Admitting: Oncology

## 2018-11-29 ENCOUNTER — Other Ambulatory Visit: Payer: Medicare Other

## 2018-11-29 ENCOUNTER — Ambulatory Visit: Payer: Medicare Other | Admitting: Oncology

## 2018-12-18 ENCOUNTER — Ambulatory Visit: Admission: RE | Admit: 2018-12-18 | Payer: Medicare Other | Source: Home / Self Care | Admitting: Ophthalmology

## 2018-12-18 ENCOUNTER — Encounter: Admission: RE | Payer: Self-pay | Source: Home / Self Care

## 2018-12-18 SURGERY — PHACOEMULSIFICATION, CATARACT, WITH IOL INSERTION
Anesthesia: Choice | Laterality: Left

## 2019-01-27 ENCOUNTER — Inpatient Hospital Stay: Payer: Medicare Other

## 2019-01-27 ENCOUNTER — Inpatient Hospital Stay: Payer: Medicare Other | Admitting: Oncology

## 2019-03-07 ENCOUNTER — Other Ambulatory Visit: Payer: Medicare Other

## 2019-03-07 ENCOUNTER — Ambulatory Visit: Payer: Medicare Other | Admitting: Oncology

## 2019-03-20 ENCOUNTER — Other Ambulatory Visit: Payer: Self-pay

## 2019-03-20 DIAGNOSIS — D509 Iron deficiency anemia, unspecified: Secondary | ICD-10-CM

## 2019-03-21 ENCOUNTER — Other Ambulatory Visit: Payer: Self-pay

## 2019-03-21 ENCOUNTER — Inpatient Hospital Stay (HOSPITAL_BASED_OUTPATIENT_CLINIC_OR_DEPARTMENT_OTHER): Payer: Medicare Other | Admitting: Oncology

## 2019-03-21 ENCOUNTER — Inpatient Hospital Stay: Payer: Medicare Other | Attending: Oncology

## 2019-03-21 ENCOUNTER — Encounter: Payer: Self-pay | Admitting: Oncology

## 2019-03-21 ENCOUNTER — Inpatient Hospital Stay: Payer: Medicare Other | Admitting: Oncology

## 2019-03-21 VITALS — BP 146/68 | HR 65 | Temp 97.7°F | Resp 18 | Wt 186.7 lb

## 2019-03-21 DIAGNOSIS — Z794 Long term (current) use of insulin: Secondary | ICD-10-CM | POA: Insufficient documentation

## 2019-03-21 DIAGNOSIS — I4891 Unspecified atrial fibrillation: Secondary | ICD-10-CM | POA: Diagnosis not present

## 2019-03-21 DIAGNOSIS — E785 Hyperlipidemia, unspecified: Secondary | ICD-10-CM | POA: Diagnosis not present

## 2019-03-21 DIAGNOSIS — D509 Iron deficiency anemia, unspecified: Secondary | ICD-10-CM

## 2019-03-21 DIAGNOSIS — Z7901 Long term (current) use of anticoagulants: Secondary | ICD-10-CM | POA: Insufficient documentation

## 2019-03-21 DIAGNOSIS — Z79899 Other long term (current) drug therapy: Secondary | ICD-10-CM | POA: Insufficient documentation

## 2019-03-21 DIAGNOSIS — I10 Essential (primary) hypertension: Secondary | ICD-10-CM | POA: Diagnosis not present

## 2019-03-21 DIAGNOSIS — E119 Type 2 diabetes mellitus without complications: Secondary | ICD-10-CM | POA: Diagnosis not present

## 2019-03-21 DIAGNOSIS — E538 Deficiency of other specified B group vitamins: Secondary | ICD-10-CM | POA: Diagnosis not present

## 2019-03-21 DIAGNOSIS — J449 Chronic obstructive pulmonary disease, unspecified: Secondary | ICD-10-CM | POA: Insufficient documentation

## 2019-03-21 DIAGNOSIS — Z90710 Acquired absence of both cervix and uterus: Secondary | ICD-10-CM

## 2019-03-21 LAB — CBC WITH DIFFERENTIAL/PLATELET
Abs Immature Granulocytes: 0.02 10*3/uL (ref 0.00–0.07)
Basophils Absolute: 0 10*3/uL (ref 0.0–0.1)
Basophils Relative: 0 %
Eosinophils Absolute: 0.1 10*3/uL (ref 0.0–0.5)
Eosinophils Relative: 1 %
HCT: 34.6 % — ABNORMAL LOW (ref 36.0–46.0)
Hemoglobin: 10.7 g/dL — ABNORMAL LOW (ref 12.0–15.0)
Immature Granulocytes: 0 %
Lymphocytes Relative: 21 %
Lymphs Abs: 1.8 10*3/uL (ref 0.7–4.0)
MCH: 26.7 pg (ref 26.0–34.0)
MCHC: 30.9 g/dL (ref 30.0–36.0)
MCV: 86.3 fL (ref 80.0–100.0)
Monocytes Absolute: 0.6 10*3/uL (ref 0.1–1.0)
Monocytes Relative: 7 %
Neutro Abs: 6 10*3/uL (ref 1.7–7.7)
Neutrophils Relative %: 71 %
Platelets: 220 10*3/uL (ref 150–400)
RBC: 4.01 MIL/uL (ref 3.87–5.11)
RDW: 15.4 % (ref 11.5–15.5)
WBC: 8.5 10*3/uL (ref 4.0–10.5)
nRBC: 0 % (ref 0.0–0.2)

## 2019-03-21 LAB — FERRITIN: Ferritin: 16 ng/mL (ref 11–307)

## 2019-03-21 LAB — IRON AND TIBC
Iron: 40 ug/dL (ref 28–170)
Saturation Ratios: 10 % — ABNORMAL LOW (ref 10.4–31.8)
TIBC: 395 ug/dL (ref 250–450)
UIBC: 355 ug/dL

## 2019-03-21 NOTE — Progress Notes (Signed)
Pt in for follow up, reports still being tired, but has not gotten worse.

## 2019-03-24 ENCOUNTER — Telehealth: Payer: Self-pay | Admitting: *Deleted

## 2019-03-24 ENCOUNTER — Other Ambulatory Visit: Payer: Self-pay | Admitting: Oncology

## 2019-03-24 NOTE — Telephone Encounter (Signed)
-----   Message from Sindy Guadeloupe, MD sent at 03/24/2019  8:12 AM EDT ----- She needs venofer. I will put the orders in. Lorena- please schedule

## 2019-03-24 NOTE — Telephone Encounter (Signed)
Called pt to tell her of the low ferritin and Dr. Janese Banks rec: to get venofer. The patient states that her granddaughter is the only one that can bring her and she can only do it friday afternoons. The next 2 Friday's she is busy with MD appt  And then 2nd Friday she ahs to get covid testing for cataract surgery and then go back for the eye visits. She wants to wait till all that it is over and then she will call me and we will set it up. I gave her my direct number

## 2019-03-24 NOTE — Progress Notes (Signed)
Hematology/Oncology Consult note [facility]  Telephone:([phone] [phone]  Patient Care Team: Letta Median, MD as PCP - General Jackelyn Hoehn, Aura Fey, FNP as Nurse Practitioner (Family Medicine) Isaias Cowman, MD as Consulting Physician (Cardiology)   Name of the patient: Dorothy [name]  [id number]  Nov 16, 1934   Date of visit: 03/24/19  Diagnosis- iron deficiencyand b12 deficiencyanemia  Chief complaint/ Reason for visit- routine f/u of iron deficiency anemia  Heme/Onc history: patient is a 84 year old female who was seen by Dr. Ouida Sills 2016 deficiency anemia. At that time she had an EGD and a colonoscopy which did not reveal any evidence of bleeding. She didn't get better with oral iron and was eventually asked to follow-up with her primary care doctor.She does have a remote h/o Billroth I gastro duodenostomy  Patient was admitted to the hospital for severe allergy angioedema and was found to have a pneumonia and heart failurein April 2018. Her hemoglobin was 7.3 with an MCV of 68. She has received 2 doses ofvenofer200 mg along with one unit of blood transfusion. She was seen by Dr. Alice Reichert and underwent EGD and colonoscopy in October 2018. EGD showed evidence of billroth I but ws otherwise normal. Colonoscopy showed diverticulosis, internal and external hemorrhoids and 1 polyp (tubular adenoma). No active bleeding  Iron studies from 12/19/2016 showed a low serum iron of 13 and low iron saturation of 3% and normal TIBC of 426. Ferritin was low at 9 and B12 was normal at 151. Patient has receivedvenofer and B12 injections inthe past   Interval history- she reports mild chronic fatigue. Denies any blood in stool or urine  ECOG PS- 1 Pain scale- 0   Review of systems- Review of Systems  Constitutional: Positive for malaise/fatigue. Negative for chills, fever and weight loss.  HENT: Negative for congestion, ear discharge and  nosebleeds.   Eyes: Negative for blurred vision.  Respiratory: Negative for cough, hemoptysis, sputum production, shortness of breath and wheezing.   Cardiovascular: Negative for chest pain, palpitations, orthopnea and claudication.  Gastrointestinal: Negative for abdominal pain, blood in stool, constipation, diarrhea, heartburn, melena, nausea and vomiting.  Genitourinary: Negative for dysuria, flank pain, frequency, hematuria and urgency.  Musculoskeletal: Negative for back pain, joint pain and myalgias.  Skin: Negative for rash.  Neurological: Negative for dizziness, tingling, focal weakness, seizures, weakness and headaches.  Endo/Heme/Allergies: Does not bruise/bleed easily.  Psychiatric/Behavioral: Negative for depression and suicidal ideas. The patient does not have insomnia.       Allergies  Allergen Reactions  . Tramadol Anaphylaxis  . Aspirin      Past Medical History:  Diagnosis Date  . Anemia   . Arthritis    hands, knees, back  . Atrial fibrillation ([place])   . CHF (congestive heart failure) ([place])   . COPD (chronic obstructive pulmonary disease) ([place])   . Diabetes mellitus without complication ([place])   . Dyspnea   . GERD (gastroesophageal reflux disease)   . Hyperlipidemia   . Hypertension   . PONV (postoperative nausea and vomiting)   . Sleep apnea    told she needs CPAP, does not have  . Wears dentures    full upper and lower.  only wears upper     Past Surgical History:  Procedure Laterality Date  . ABDOMINAL HYSTERECTOMY    . CATARACT EXTRACTION W/PHACO Right 07/12/2016   Procedure: CATARACT EXTRACTION PHACO AND INTRAOCULAR LENS PLACEMENT (IOC);  Surgeon: Leandrew Koyanagi, MD;  Location: [facility];  Service: Ophthalmology;  Laterality: Right;  DIABETIC - insulin and oral meds sleep apnea  . CHOLECYSTECTOMY    . COLONOSCOPY N/A 01/19/2015   Procedure: COLONOSCOPY;  Surgeon: Josefine Class, MD;  Location: [facility] ENDOSCOPY;  Service:  Endoscopy;  Laterality: N/A;  . COLONOSCOPY WITH PROPOFOL N/A 07/11/2017   Procedure: COLONOSCOPY WITH PROPOFOL;  Surgeon: Toledo, Benay Pike, MD;  Location: [facility] ENDOSCOPY;  Service: Gastroenterology;  Laterality: N/A;  . ESOPHAGOGASTRODUODENOSCOPY N/A 01/19/2015   Procedure: ESOPHAGOGASTRODUODENOSCOPY (EGD);  Surgeon: Josefine Class, MD;  Location: [facility] ENDOSCOPY;  Service: Endoscopy;  Laterality: N/A;  . ESOPHAGOGASTRODUODENOSCOPY (EGD) WITH PROPOFOL N/A 07/11/2017   Procedure: ESOPHAGOGASTRODUODENOSCOPY (EGD) WITH PROPOFOL;  Surgeon: Toledo, Benay Pike, MD;  Location: [facility] ENDOSCOPY;  Service: Gastroenterology;  Laterality: N/A;  . SHOULDER ARTHROSCOPY      Social History   Socioeconomic History  . Marital status: Widowed    Spouse name: Not on file  . Number of children: Not on file  . Years of education: Not on file  . Highest education level: Not on file  Occupational History  . Not on file  Social Needs  . Financial resource strain: Not on file  . Food insecurity    Worry: Not on file    Inability: Not on file  . Transportation needs    Medical: Not on file    Non-medical: Not on file  Tobacco Use  . Smoking status: Never Smoker  . Smokeless tobacco: Never Used  Substance and Sexual Activity  . Alcohol use: No  . Drug use: Not on file  . Sexual activity: Not on file  Lifestyle  . Physical activity    Days per week: Not on file    Minutes per session: Not on file  . Stress: Not on file  Relationships  . Social Herbalist on phone: Not on file    Gets together: Not on file    Attends religious service: Not on file    Active member of club or organization: Not on file    Attends meetings of clubs or organizations: Not on file    Relationship status: Not on file  . Intimate partner violence    Fear of current or ex partner: Not on file    Emotionally abused: Not on file    Physically abused: Not on file    Forced sexual activity: Not on file  Other  Topics Concern  . Not on file  Social History Narrative  . Not on file    History reviewed. No pertinent family history.   Current Outpatient Medications:  .  ACCU-CHEK SOFTCLIX LANCETS lancets, , Disp: , Rfl:  .  acetaminophen (TYLENOL) 500 MG tablet, Take 1,000 mg by mouth every 6 (six) hours as needed for moderate pain or headache., Disp: , Rfl:  .  albuterol (PROVENTIL HFA;VENTOLIN HFA) 108 (90 BASE) MCG/ACT inhaler, Inhale 2 puffs into the lungs every 6 (six) hours as needed for wheezing or shortness of breath., Disp: , Rfl:  .  alendronate (FOSAMAX) 70 MG tablet, Take by mouth., Disp: , Rfl:  .  amLODipine (NORVASC) 5 MG tablet, , Disp: , Rfl:  .  apixaban (ELIQUIS) 2.5 MG TABS tablet, Take by mouth., Disp: , Rfl:  .  atorvastatin (LIPITOR) 80 MG tablet, Take 80 mg by mouth daily., Disp: , Rfl:  .  Calcium Carb-Cholecalciferol (CALCIUM 600 + D PO), Take 1 tablet by mouth., Disp: , Rfl:  .  Calcium Carbonate-Vitamin D (CALCIUM  HIGH POTENCY/VITAMIN D) 600-200 MG-UNIT TABS, Take by mouth., Disp: , Rfl:  .  carvedilol (COREG) 6.25 MG tablet, Take 1 tablet (6.25 mg total) by mouth 2 (two) times daily with a meal., Disp: 60 tablet, Rfl: 2 .  cloNIDine (CATAPRES) 0.1 MG tablet, Take 0.1 mg by mouth 2 (two) times daily., Disp: , Rfl:  .  esomeprazole (NEXIUM) 20 MG capsule, Take 20 mg by mouth at bedtime., Disp: , Rfl:  .  ferrous sulfate 325 (65 FE) MG tablet, Take 325 mg by mouth daily with breakfast., Disp: , Rfl:  .  fluticasone (FLONASE) 50 MCG/ACT nasal spray, , Disp: , Rfl:  .  furosemide (LASIX) 20 MG tablet, Take 20 mg by mouth 2 (two) times daily., Disp: , Rfl:  .  glipiZIDE (GLUCOTROL XL) 10 MG 24 hr tablet, Take 10 mg by mouth daily with breakfast., Disp: , Rfl:  .  insulin NPH Human (HUMULIN N,NOVOLIN N) 100 UNIT/ML injection, Inject 0.23 mLs (23 Units total) into the skin 2 (two) times daily before a meal. (Patient taking differently: Inject 25 Units into the skin 2 (two) times  daily before a meal. Take 15 units at night), Disp: 10 mL, Rfl: 11 .  ipratropium-albuterol (DUONEB) 0.5-2.5 (3) MG/3ML SOLN, Take 3 mLs by nebulization every 6 (six) hours as needed., Disp: 360 mL, Rfl: 0 .  lisinopril (PRINIVIL,ZESTRIL) 10 MG tablet, Take 10 mg by mouth daily., Disp: , Rfl:  .  metFORMIN (GLUMETZA) 1000 MG (MOD) 24 hr tablet, Take 1,000 mg by mouth 2 (two) times daily., Disp: , Rfl:  .  metoprolol tartrate (LOPRESSOR) 25 MG tablet, Take 25 mg by mouth 2 (two) times daily., Disp: , Rfl:  .  potassium chloride (K-DUR) 10 MEQ tablet, Take 10 mEq by mouth 2 (two) times daily., Disp: , Rfl:  .  colestipol (COLESTID) 1 g tablet, Take by mouth., Disp: , Rfl:  .  cyanocobalamin (,VITAMIN B-12,) 1000 MCG/ML injection, Inject into the muscle., Disp: , Rfl:  .  senna-docusate (SENOKOT-S) 8.6-50 MG tablet, Take 2 tablets by mouth 2 (two) times daily. (Patient not taking: Reported on 05/24/2018), Disp: 30 tablet, Rfl: 0  Physical exam:  Vitals:   03/21/19 1428  BP: (!) 146/68  Pulse: 65  Resp: 18  Temp: 97.7 F (36.5 C)  TempSrc: Tympanic  SpO2: 100%  Weight: 186 lb 11.2 oz (84.7 kg)   Physical Exam Constitutional:      General: She is not in acute distress. HENT:     Head: Normocephalic and atraumatic.  Eyes:     Pupils: Pupils are equal, round, and reactive to light.  Neck:     Musculoskeletal: Normal range of motion.  Cardiovascular:     Rate and Rhythm: Normal rate and regular rhythm.     Heart sounds: Normal heart sounds.  Pulmonary:     Effort: Pulmonary effort is normal.     Breath sounds: Normal breath sounds.  Abdominal:     General: Bowel sounds are normal.     Palpations: Abdomen is soft.  Skin:    General: Skin is warm and dry.  Neurological:     Mental Status: She is alert and oriented to person, place, and time.      CMP Latest Ref Rng & Units 12/27/2016  Glucose 65 - 99 mg/dL 179(H)  BUN 6 - 20 mg/dL 17  Creatinine 0.44 - 1.00 mg/dL 1.20(H)   Sodium 135 - 145 mmol/L 140  Potassium 3.5 - 5.1 mmol/L  3.9  Chloride 101 - 111 mmol/L 98(L)  CO2 22 - 32 mmol/L 31  Calcium 8.9 - 10.3 mg/dL 9.2  Total Protein 6.5 - 8.1 g/dL 6.6  Total Bilirubin 0.3 - 1.2 mg/dL 0.8  Alkaline Phos 38 - 126 U/L 81  AST 15 - 41 U/L 29  ALT 14 - 54 U/L 23   CBC Latest Ref Rng & Units 03/21/2019  WBC 4.0 - 10.5 K/uL 8.5  Hemoglobin 12.0 - 15.0 g/dL 10.7(L)  Hematocrit 36.0 - 46.0 % 34.6(L)  Platelets 150 - 400 K/uL 220    Assessment and plan- Patient is a 84 y.o. female with iron and b12 deficiency here for routine f/u  Hb is 10.7 today. However ferritin is low at 16 and iron saturation is low at 10%.  She will therefore proceed with 5 doses of Venofer over the next 3 weeks.Repeat CBC ferritin and iron studies in 3 months in 6 months and I will see her back in 6 months   Visit Diagnosis 1. Iron deficiency anemia, unspecified iron deficiency anemia type      Dr. Randa Evens, MD, MPH [facility] [phone] 03/24/2019 12:17 PM

## 2019-03-27 ENCOUNTER — Inpatient Hospital Stay: Payer: Medicare Other

## 2019-04-03 ENCOUNTER — Other Ambulatory Visit: Admission: RE | Admit: 2019-04-03 | Payer: Medicare Other | Source: Ambulatory Visit

## 2019-04-03 NOTE — Discharge Instructions (Signed)

## 2019-04-04 ENCOUNTER — Other Ambulatory Visit
Admission: RE | Admit: 2019-04-04 | Discharge: 2019-04-04 | Disposition: A | Discharge status: Home / Self Care | Payer: Medicare Other | Source: Ambulatory Visit | Attending: Ophthalmology | Admitting: Ophthalmology

## 2019-04-04 ENCOUNTER — Other Ambulatory Visit: Payer: Self-pay

## 2019-04-04 DIAGNOSIS — Z1159 Encounter for screening for other viral diseases: Secondary | ICD-10-CM | POA: Diagnosis present

## 2019-04-05 LAB — SARS CORONAVIRUS 2 (TAT 6-24 HRS): SARS Coronavirus 2: NEGATIVE

## 2019-04-07 ENCOUNTER — Encounter: Payer: Self-pay | Admitting: *Deleted

## 2019-04-07 ENCOUNTER — Other Ambulatory Visit: Payer: Self-pay

## 2019-04-07 NOTE — Anesthesia Preprocedure Evaluation (Addendum)
Anesthesia Evaluation  Patient identified by MRN, date of birth, ID band Patient awake    Reviewed: Allergy & Precautions, NPO status , Patient's Chart, lab work & pertinent test results  History of Anesthesia Complications (+) PONV and history of anesthetic complications  Airway Mallampati: IV   Neck ROM: Full    Dental  (+) Lower Dentures, Upper Dentures   Pulmonary sleep apnea , COPD,    Pulmonary exam normal breath sounds clear to auscultation       Cardiovascular hypertension, +CHF  + dysrhythmias (a fib on Eliquis, last dose 04/08/19)  Rhythm:Irregular Rate:Normal     Neuro/Psych negative neurological ROS     GI/Hepatic GERD  ,  Endo/Other  diabetes, Type 1, Insulin Dependent  Renal/GU negative Renal ROS     Musculoskeletal  (+) Arthritis ,   Abdominal   Peds  Hematology  (+) Blood dyscrasia, anemia ,   Anesthesia Other Findings Cardiology note 02/14/19:  Assessment   84 y.o. female with  1. Chronic congestive heart failure, unspecified heart failure type (CMS-HCC)  2. Essential hypertension with goal blood pressure less than 140/90  3. Chronic atrial fibrillation (CMS-HCC)  4. Pure hypercholesterolemia  5. Pedal edema  6. SOB (shortness of breath) on exertion   84 year old female with chronic atrial fibrillation, chads vasc score 6, currently on Eliquis, which was temporarily held, for marked iron deficiency anemia, with nondiagnostic colonoscopy, and stabilization of hemoglobin and hematocrit. The patient has essential hypertension, systolic blood pressure mildly elevated on current BP medications.  Plan   1. Continue current medications 2. Continue low-dose Eliquis 2.5 mg twice daily 3. Counseled patient about low-sodium diet 4. DASH diet printed instructions given to the patient 5. Counseled patient about low-cholesterol diet 6. Continue atorvastatin for hyperlipidemia management 7. Heart  healthy diet printed instructions given to the patient 8. Return to clinic for follow-up in 4 months   Reproductive/Obstetrics                           Anesthesia Physical Anesthesia Plan  ASA: III  Anesthesia Plan: MAC   Post-op Pain Management:    Induction: Intravenous  PONV Risk Score and Plan: 3 and TIVA, Midazolam and Treatment may vary due to age or medical condition  Airway Management Planned: Natural Airway  Additional Equipment:   Intra-op Plan:   Post-operative Plan:   Informed Consent: I have reviewed the patients History and Physical, chart, labs and discussed the procedure including the risks, benefits and alternatives for the proposed anesthesia with the patient or authorized representative who has indicated his/her understanding and acceptance.       Plan Discussed with: CRNA  Anesthesia Plan Comments:        Anesthesia Quick Evaluation

## 2019-04-09 ENCOUNTER — Encounter
Admission: RE | Disposition: A | Discharge status: Home / Self Care | Payer: Self-pay | Source: Home / Self Care | Attending: Ophthalmology

## 2019-04-09 ENCOUNTER — Other Ambulatory Visit: Payer: Self-pay

## 2019-04-09 ENCOUNTER — Ambulatory Visit: Payer: Medicare Other | Admitting: Anesthesiology

## 2019-04-09 ENCOUNTER — Ambulatory Visit
Admission: RE | Admit: 2019-04-09 | Discharge: 2019-04-09 | Disposition: A | Discharge status: Home / Self Care | Payer: Medicare Other | Attending: Ophthalmology | Admitting: Ophthalmology

## 2019-04-09 DIAGNOSIS — K219 Gastro-esophageal reflux disease without esophagitis: Secondary | ICD-10-CM | POA: Diagnosis not present

## 2019-04-09 DIAGNOSIS — M1990 Unspecified osteoarthritis, unspecified site: Secondary | ICD-10-CM | POA: Diagnosis not present

## 2019-04-09 DIAGNOSIS — H2512 Age-related nuclear cataract, left eye: Secondary | ICD-10-CM | POA: Insufficient documentation

## 2019-04-09 DIAGNOSIS — J449 Chronic obstructive pulmonary disease, unspecified: Secondary | ICD-10-CM | POA: Insufficient documentation

## 2019-04-09 DIAGNOSIS — Z794 Long term (current) use of insulin: Secondary | ICD-10-CM | POA: Diagnosis not present

## 2019-04-09 DIAGNOSIS — Z7901 Long term (current) use of anticoagulants: Secondary | ICD-10-CM | POA: Diagnosis not present

## 2019-04-09 DIAGNOSIS — I509 Heart failure, unspecified: Secondary | ICD-10-CM | POA: Diagnosis not present

## 2019-04-09 DIAGNOSIS — I110 Hypertensive heart disease with heart failure: Secondary | ICD-10-CM | POA: Insufficient documentation

## 2019-04-09 DIAGNOSIS — E7800 Pure hypercholesterolemia, unspecified: Secondary | ICD-10-CM | POA: Diagnosis not present

## 2019-04-09 DIAGNOSIS — E1036 Type 1 diabetes mellitus with diabetic cataract: Secondary | ICD-10-CM | POA: Insufficient documentation

## 2019-04-09 DIAGNOSIS — I4891 Unspecified atrial fibrillation: Secondary | ICD-10-CM | POA: Diagnosis not present

## 2019-04-09 DIAGNOSIS — Z79899 Other long term (current) drug therapy: Secondary | ICD-10-CM | POA: Diagnosis not present

## 2019-04-09 HISTORY — PX: CATARACT EXTRACTION W/PHACO: SHX586

## 2019-04-09 LAB — GLUCOSE, CAPILLARY
Glucose-Capillary: 123 mg/dL — ABNORMAL HIGH (ref 70–99)
Glucose-Capillary: 134 mg/dL — ABNORMAL HIGH (ref 70–99)

## 2019-04-09 SURGERY — PHACOEMULSIFICATION, CATARACT, WITH IOL INSERTION
Anesthesia: Monitor Anesthesia Care | Site: Eye | Laterality: Left

## 2019-04-09 MED ORDER — MIDAZOLAM HCL 2 MG/2ML IJ SOLN
INTRAMUSCULAR | Status: DC | PRN
  Administered 2019-04-09: 1 mg via INTRAVENOUS

## 2019-04-09 MED ORDER — TETRACAINE HCL 0.5 % OP SOLN
1.0000 [drp] | OPHTHALMIC | Status: DC | PRN
  Administered 2019-04-09 (×3): 1 [drp] via OPHTHALMIC

## 2019-04-09 MED ORDER — ARMC OPHTHALMIC DILATING DROPS
1.0000 "application " | OPHTHALMIC | Status: DC | PRN
  Administered 2019-04-09 (×3): 1 via OPHTHALMIC

## 2019-04-09 MED ORDER — LIDOCAINE HCL (PF) 2 % IJ SOLN
INTRAOCULAR | Status: DC | PRN
  Administered 2019-04-09: 2 mL

## 2019-04-09 MED ORDER — ACETAMINOPHEN 160 MG/5ML PO SOLN: 325.0000 mg | ORAL | Status: DC | PRN

## 2019-04-09 MED ORDER — NA HYALUR & NA CHOND-NA HYALUR 0.4-0.35 ML IO KIT
PACK | INTRAOCULAR | Status: DC | PRN
  Administered 2019-04-09: 1 mL via INTRAOCULAR

## 2019-04-09 MED ORDER — ACETAMINOPHEN 325 MG PO TABS: 650.0000 mg | ORAL_TABLET | Freq: Once | ORAL | Status: DC | PRN

## 2019-04-09 MED ORDER — ONDANSETRON HCL 4 MG/2ML IJ SOLN: 4.0000 mg | Freq: Once | INTRAMUSCULAR | Status: DC | PRN

## 2019-04-09 MED ORDER — BRIMONIDINE TARTRATE-TIMOLOL 0.2-0.5 % OP SOLN
OPHTHALMIC | Status: DC | PRN
  Administered 2019-04-09: 1 [drp] via OPHTHALMIC

## 2019-04-09 MED ORDER — EPINEPHRINE PF 1 MG/ML IJ SOLN
INTRAOCULAR | Status: DC | PRN
  Administered 2019-04-09: 66 mL via OPHTHALMIC

## 2019-04-09 MED ORDER — CEFUROXIME OPHTHALMIC INJECTION 1 MG/0.1 ML
INJECTION | OPHTHALMIC | Status: DC | PRN
  Administered 2019-04-09: 0.1 mL via INTRACAMERAL

## 2019-04-09 MED ORDER — FENTANYL CITRATE (PF) 100 MCG/2ML IJ SOLN
INTRAMUSCULAR | Status: DC | PRN
  Administered 2019-04-09: 50 ug via INTRAVENOUS

## 2019-04-09 MED ORDER — MOXIFLOXACIN HCL 0.5 % OP SOLN
1.0000 [drp] | OPHTHALMIC | Status: DC | PRN
  Administered 2019-04-09 (×3): 1 [drp] via OPHTHALMIC

## 2019-04-09 SURGICAL SUPPLY — 20 items
CANNULA ANT/CHMB 27G (MISCELLANEOUS) ×1 IMPLANT
CANNULA ANT/CHMB 27GA (MISCELLANEOUS) ×3 IMPLANT
GLOVE SURG LX 7.5 STRW (GLOVE) ×2
GLOVE SURG LX STRL 7.5 STRW (GLOVE) ×1 IMPLANT
GLOVE SURG TRIUMPH 8.0 PF LTX (GLOVE) ×3 IMPLANT
GOWN STRL REUS W/ TWL LRG LVL3 (GOWN DISPOSABLE) ×2 IMPLANT
GOWN STRL REUS W/TWL LRG LVL3 (GOWN DISPOSABLE) ×4
LENS IOL TECNIS ITEC 23.5 (Intraocular Lens) ×2 IMPLANT
MARKER SKIN DUAL TIP RULER LAB (MISCELLANEOUS) ×3 IMPLANT
NDL FILTER BLUNT 18X1 1/2 (NEEDLE) ×1 IMPLANT
NEEDLE FILTER BLUNT 18X 1/2SAF (NEEDLE) ×2
NEEDLE FILTER BLUNT 18X1 1/2 (NEEDLE) ×1 IMPLANT
PACK CATARACT BRASINGTON (MISCELLANEOUS) ×3 IMPLANT
PACK EYE AFTER SURG (MISCELLANEOUS) ×3 IMPLANT
PACK OPTHALMIC (MISCELLANEOUS) ×3 IMPLANT
SYR 3ML LL SCALE MARK (SYRINGE) ×3 IMPLANT
SYR 5ML LL (SYRINGE) ×3 IMPLANT
SYR TB 1ML LUER SLIP (SYRINGE) ×3 IMPLANT
WATER STERILE IRR 500ML POUR (IV SOLUTION) ×3 IMPLANT
WIPE NON LINTING 3.25X3.25 (MISCELLANEOUS) ×3 IMPLANT

## 2019-04-09 NOTE — Anesthesia Procedure Notes (Addendum)
Procedure Name: MAC Date/Time: 04/09/2019 7:48 AM Performed by: Cameron Ali, CRNA Pre-anesthesia Checklist: Patient identified, Emergency Drugs available, Suction available, Timeout performed and Patient being monitored Patient Re-evaluated:Patient Re-evaluated prior to induction Oxygen Delivery Method: Nasal cannula Placement Confirmation: positive ETCO2

## 2019-04-09 NOTE — Transfer of Care (Signed)
Immediate Anesthesia Transfer of Care Note  Patient: Dorothy [name]  Procedure(s) Performed: CATARACT EXTRACTION PHACO AND INTRAOCULAR LENS PLACEMENT (IOC)  LEFT DIABETIC (Left Eye)  Patient Location: PACU  Anesthesia Type: MAC  Level of Consciousness: awake, alert  and patient cooperative  Airway and Oxygen Therapy: Patient Spontanous Breathing and Patient connected to supplemental oxygen  Post-op Assessment: Post-op Vital signs reviewed, Patient's Cardiovascular Status Stable, Respiratory Function Stable, Patent Airway and No signs of Nausea or vomiting  Post-op Vital Signs: Reviewed and stable  Complications: No apparent anesthesia complications

## 2019-04-09 NOTE — Op Note (Signed)
OPERATIVE NOTE  STACI DACK [id number] 04/09/2019   PREOPERATIVE DIAGNOSIS:  Nuclear sclerotic cataract left eye. H25.12   POSTOPERATIVE DIAGNOSIS:    Nuclear sclerotic cataract left eye.     PROCEDURE:  Phacoemusification with posterior chamber intraocular lens placement of the left eye   LENS:   Implant Name Type Inv. Item Serial No. Manufacturer Lot No. LRB No. Used Action  LENS IOL DIOP 23.5 - [id number] Intraocular Lens LENS IOL DIOP 23.5 [id number] AMO  Left 1 Implanted        ULTRASOUND TIME: 13  % of 1 minutes 45 seconds, CDE 14.4  SURGEON:  Wyonia Hough, MD   ANESTHESIA:  Topical with tetracaine drops and 2% Xylocaine jelly, augmented with 1% preservative-free intracameral lidocaine.    COMPLICATIONS:  None.   DESCRIPTION OF PROCEDURE:  The patient was identified in the holding room and transported to the operating room and placed in the supine position under the operating microscope.  The left eye was identified as the operative eye and it was prepped and draped in the usual sterile ophthalmic fashion.   A 1 millimeter clear-corneal paracentesis was made at the 1:30 position.  0.5 ml of preservative-free 1% lidocaine was injected into the anterior chamber.  The anterior chamber was filled with Viscoat viscoelastic.  A 2.4 millimeter keratome was used to make a near-clear corneal incision at the 10:30 position.  .  A curvilinear capsulorrhexis was made with a cystotome and capsulorrhexis forceps.  Balanced salt solution was used to hydrodissect and hydrodelineate the nucleus.   Phacoemulsification was then used in stop and chop fashion to remove the lens nucleus and epinucleus.  The remaining cortex was then removed using the irrigation and aspiration handpiece. Provisc was then placed into the capsular bag to distend it for lens placement.  A lens was then injected into the capsular bag.  The remaining viscoelastic was aspirated.   Wounds were hydrated with  balanced salt solution.  The anterior chamber was inflated to a physiologic pressure with balanced salt solution.  No wound leaks were noted. Cefuroxime 0.1 ml of a 10mg /ml solution was injected into the anterior chamber for a dose of 1 mg of intracameral antibiotic at the completion of the case.   Timolol and Brimonidine drops were applied to the eye.  The patient was taken to the recovery room in stable condition without complications of anesthesia or surgery.  Calel Pisarski 04/09/2019, 8:06 AM

## 2019-04-09 NOTE — H&P (Signed)

## 2019-04-09 NOTE — Anesthesia Postprocedure Evaluation (Signed)
Anesthesia Post Note  Patient: Dorothy [name]  Procedure(s) Performed: CATARACT EXTRACTION PHACO AND INTRAOCULAR LENS PLACEMENT (IOC)  LEFT DIABETIC (Left Eye)  Patient location during evaluation: PACU Anesthesia Type: MAC Level of consciousness: awake and alert, oriented and patient cooperative Pain management: pain level controlled Vital Signs Assessment: post-procedure vital signs reviewed and stable Respiratory status: spontaneous breathing, nonlabored ventilation and respiratory function stable Cardiovascular status: blood pressure returned to baseline and stable Postop Assessment: adequate PO intake Anesthetic complications: no    Darrin Nipper

## 2019-04-10 ENCOUNTER — Encounter: Payer: Self-pay | Admitting: Ophthalmology

## 2019-04-25 ENCOUNTER — Other Ambulatory Visit: Payer: Self-pay

## 2019-04-25 ENCOUNTER — Inpatient Hospital Stay: Payer: Medicare Other | Attending: Oncology

## 2019-04-25 VITALS — BP 168/69 | HR 70 | Temp 97.0°F | Resp 19

## 2019-04-25 DIAGNOSIS — D509 Iron deficiency anemia, unspecified: Secondary | ICD-10-CM | POA: Insufficient documentation

## 2019-04-25 MED ORDER — SODIUM CHLORIDE 0.9 % IV SOLN
Freq: Once | INTRAVENOUS | Status: AC
  Administered 2019-04-25: 12:00:00 via INTRAVENOUS
  Filled 2019-04-25: qty 250

## 2019-04-25 MED ORDER — IRON SUCROSE 20 MG/ML IV SOLN
200.0000 mg | Freq: Once | INTRAVENOUS | Status: AC
  Administered 2019-04-25: 200 mg via INTRAVENOUS
  Filled 2019-04-25: qty 10

## 2019-04-25 MED ORDER — SODIUM CHLORIDE 0.9 % IV SOLN: 200.0000 mg | Freq: Once | INTRAVENOUS | Status: DC

## 2019-04-30 ENCOUNTER — Telehealth: Payer: Self-pay | Admitting: *Deleted

## 2019-04-30 NOTE — Telephone Encounter (Signed)
Called pt to check to make sure that she had her cataract surgery and she is healed up and better vision and she says it work very good. I asked if she was ready to get iron treatments and she said her granddaughter called and they did an infusion on 8/14.  I looked and saw it , but I told her that she needs to have 4 more. She is not sure when her granddaughter can bring her but she knows it will be on a Friday. I gave the pt. My direct number for her grand daughter to call me and we can set up more infusions. She said she will have her call me

## 2019-06-06 ENCOUNTER — Other Ambulatory Visit: Payer: Self-pay

## 2019-06-06 ENCOUNTER — Inpatient Hospital Stay: Payer: Medicare Other | Attending: Oncology

## 2019-06-06 VITALS — BP 162/79 | HR 78 | Temp 98.0°F | Resp 18

## 2019-06-06 DIAGNOSIS — D509 Iron deficiency anemia, unspecified: Secondary | ICD-10-CM

## 2019-06-06 MED ORDER — SODIUM CHLORIDE 0.9 % IV SOLN
Freq: Once | INTRAVENOUS | Status: AC
  Administered 2019-06-06: 14:00:00 via INTRAVENOUS
  Filled 2019-06-06: qty 250

## 2019-06-06 MED ORDER — SODIUM CHLORIDE 0.9 % IV SOLN: 200.0000 mg | Freq: Once | INTRAVENOUS | Status: DC

## 2019-06-06 MED ORDER — IRON SUCROSE 20 MG/ML IV SOLN
200.0000 mg | Freq: Once | INTRAVENOUS | Status: AC
  Administered 2019-06-06: 14:00:00 200 mg via INTRAVENOUS
  Filled 2019-06-06: qty 10

## 2019-06-13 ENCOUNTER — Other Ambulatory Visit: Payer: Self-pay

## 2019-06-13 ENCOUNTER — Inpatient Hospital Stay: Payer: Medicare Other | Attending: Oncology

## 2019-06-13 DIAGNOSIS — D509 Iron deficiency anemia, unspecified: Secondary | ICD-10-CM | POA: Insufficient documentation

## 2019-06-13 LAB — CBC WITH DIFFERENTIAL/PLATELET
Abs Immature Granulocytes: 0.04 10*3/uL (ref 0.00–0.07)
Basophils Absolute: 0 10*3/uL (ref 0.0–0.1)
Basophils Relative: 0 %
Eosinophils Absolute: 0.1 10*3/uL (ref 0.0–0.5)
Eosinophils Relative: 1 %
HCT: 37.4 % (ref 36.0–46.0)
Hemoglobin: 11.4 g/dL — ABNORMAL LOW (ref 12.0–15.0)
Immature Granulocytes: 1 %
Lymphocytes Relative: 19 %
Lymphs Abs: 1.7 10*3/uL (ref 0.7–4.0)
MCH: 27.1 pg (ref 26.0–34.0)
MCHC: 30.5 g/dL (ref 30.0–36.0)
MCV: 88.8 fL (ref 80.0–100.0)
Monocytes Absolute: 0.6 10*3/uL (ref 0.1–1.0)
Monocytes Relative: 6 %
Neutro Abs: 6.5 10*3/uL (ref 1.7–7.7)
Neutrophils Relative %: 73 %
Platelets: 239 10*3/uL (ref 150–400)
RBC: 4.21 MIL/uL (ref 3.87–5.11)
RDW: 14.1 % (ref 11.5–15.5)
WBC: 8.9 10*3/uL (ref 4.0–10.5)
nRBC: 0 % (ref 0.0–0.2)

## 2019-06-13 LAB — IRON AND TIBC
Iron: 42 ug/dL (ref 28–170)
Saturation Ratios: 12 % (ref 10.4–31.8)
TIBC: 361 ug/dL (ref 250–450)
UIBC: 319 ug/dL

## 2019-06-13 LAB — FERRITIN: Ferritin: 95 ng/mL (ref 11–307)

## 2019-06-23 ENCOUNTER — Other Ambulatory Visit: Payer: Medicare Other

## 2019-09-23 ENCOUNTER — Inpatient Hospital Stay: Payer: Medicare Other

## 2019-09-23 ENCOUNTER — Inpatient Hospital Stay: Payer: Medicare Other | Admitting: Oncology

## 2019-10-02 ENCOUNTER — Other Ambulatory Visit: Payer: Self-pay

## 2019-10-02 NOTE — Progress Notes (Signed)
Patient pre screened for office appointment, no questions or concerns today. Patient reminded of upcoming appointment time and date. 

## 2019-10-03 ENCOUNTER — Inpatient Hospital Stay (HOSPITAL_BASED_OUTPATIENT_CLINIC_OR_DEPARTMENT_OTHER): Payer: Medicare Other | Admitting: Oncology

## 2019-10-03 ENCOUNTER — Other Ambulatory Visit: Payer: Self-pay

## 2019-10-03 ENCOUNTER — Inpatient Hospital Stay: Payer: Medicare Other

## 2019-10-03 VITALS — BP 154/50 | HR 89 | Temp 97.1°F | Ht 64.0 in | Wt 194.8 lb

## 2019-10-03 DIAGNOSIS — E538 Deficiency of other specified B group vitamins: Secondary | ICD-10-CM

## 2019-10-03 DIAGNOSIS — D509 Iron deficiency anemia, unspecified: Secondary | ICD-10-CM | POA: Diagnosis not present

## 2019-10-03 DIAGNOSIS — J189 Pneumonia, unspecified organism: Secondary | ICD-10-CM | POA: Diagnosis not present

## 2019-10-03 LAB — CBC WITH DIFFERENTIAL/PLATELET
Abs Immature Granulocytes: 0.02 10*3/uL (ref 0.00–0.07)
Basophils Absolute: 0 10*3/uL (ref 0.0–0.1)
Basophils Relative: 0 %
Eosinophils Absolute: 0.1 10*3/uL (ref 0.0–0.5)
Eosinophils Relative: 1 %
HCT: 32 % — ABNORMAL LOW (ref 36.0–46.0)
Hemoglobin: 9.4 g/dL — ABNORMAL LOW (ref 12.0–15.0)
Immature Granulocytes: 0 %
Lymphocytes Relative: 9 %
Lymphs Abs: 0.8 10*3/uL (ref 0.7–4.0)
MCH: 27.4 pg (ref 26.0–34.0)
MCHC: 29.4 g/dL — ABNORMAL LOW (ref 30.0–36.0)
MCV: 93.3 fL (ref 80.0–100.0)
Monocytes Absolute: 0.7 10*3/uL (ref 0.1–1.0)
Monocytes Relative: 7 %
Neutro Abs: 7.4 10*3/uL (ref 1.7–7.7)
Neutrophils Relative %: 83 %
Platelets: 292 10*3/uL (ref 150–400)
RBC: 3.43 MIL/uL — ABNORMAL LOW (ref 3.87–5.11)
RDW: 15.6 % — ABNORMAL HIGH (ref 11.5–15.5)
WBC: 9 10*3/uL (ref 4.0–10.5)
nRBC: 0 % (ref 0.0–0.2)

## 2019-10-03 LAB — IRON AND TIBC
Iron: 47 ug/dL (ref 28–170)
Saturation Ratios: 15 % (ref 10.4–31.8)
TIBC: 325 ug/dL (ref 250–450)
UIBC: 278 ug/dL

## 2019-10-03 LAB — FERRITIN: Ferritin: 28 ng/mL (ref 11–307)

## 2019-10-03 NOTE — Progress Notes (Signed)
Patient stated that she gets SOB on exertion. Patient also stated that she gets tired and fatigued on exertion and that some days are better than others.

## 2019-10-05 ENCOUNTER — Other Ambulatory Visit: Payer: Self-pay

## 2019-10-05 ENCOUNTER — Encounter: Payer: Self-pay | Admitting: Emergency Medicine

## 2019-10-05 ENCOUNTER — Emergency Department: Payer: Medicare Other

## 2019-10-05 ENCOUNTER — Inpatient Hospital Stay
Admission: EM | Admit: 2019-10-05 | Discharge: 2019-10-11 | DRG: 193 | Disposition: A | Discharge status: Home Health | Payer: Medicare Other | Attending: Internal Medicine | Admitting: Internal Medicine

## 2019-10-05 DIAGNOSIS — Z885 Allergy status to narcotic agent status: Secondary | ICD-10-CM

## 2019-10-05 DIAGNOSIS — M19042 Primary osteoarthritis, left hand: Secondary | ICD-10-CM | POA: Diagnosis present

## 2019-10-05 DIAGNOSIS — M479 Spondylosis, unspecified: Secondary | ICD-10-CM | POA: Diagnosis present

## 2019-10-05 DIAGNOSIS — Z794 Long term (current) use of insulin: Secondary | ICD-10-CM | POA: Diagnosis not present

## 2019-10-05 DIAGNOSIS — E875 Hyperkalemia: Secondary | ICD-10-CM | POA: Diagnosis present

## 2019-10-05 DIAGNOSIS — M19041 Primary osteoarthritis, right hand: Secondary | ICD-10-CM | POA: Diagnosis present

## 2019-10-05 DIAGNOSIS — Z20822 Contact with and (suspected) exposure to covid-19: Secondary | ICD-10-CM | POA: Diagnosis present

## 2019-10-05 DIAGNOSIS — K219 Gastro-esophageal reflux disease without esophagitis: Secondary | ICD-10-CM | POA: Diagnosis present

## 2019-10-05 DIAGNOSIS — J441 Chronic obstructive pulmonary disease with (acute) exacerbation: Secondary | ICD-10-CM | POA: Diagnosis present

## 2019-10-05 DIAGNOSIS — E1122 Type 2 diabetes mellitus with diabetic chronic kidney disease: Secondary | ICD-10-CM | POA: Diagnosis present

## 2019-10-05 DIAGNOSIS — J440 Chronic obstructive pulmonary disease with acute lower respiratory infection: Secondary | ICD-10-CM | POA: Diagnosis present

## 2019-10-05 DIAGNOSIS — Z7901 Long term (current) use of anticoagulants: Secondary | ICD-10-CM | POA: Diagnosis not present

## 2019-10-05 DIAGNOSIS — I5032 Chronic diastolic (congestive) heart failure: Secondary | ICD-10-CM | POA: Diagnosis present

## 2019-10-05 DIAGNOSIS — R0902 Hypoxemia: Secondary | ICD-10-CM

## 2019-10-05 DIAGNOSIS — E785 Hyperlipidemia, unspecified: Secondary | ICD-10-CM | POA: Diagnosis present

## 2019-10-05 DIAGNOSIS — Z886 Allergy status to analgesic agent status: Secondary | ICD-10-CM

## 2019-10-05 DIAGNOSIS — J9601 Acute respiratory failure with hypoxia: Secondary | ICD-10-CM | POA: Diagnosis present

## 2019-10-05 DIAGNOSIS — J189 Pneumonia, unspecified organism: Secondary | ICD-10-CM | POA: Diagnosis present

## 2019-10-05 DIAGNOSIS — Z79899 Other long term (current) drug therapy: Secondary | ICD-10-CM

## 2019-10-05 DIAGNOSIS — M170 Bilateral primary osteoarthritis of knee: Secondary | ICD-10-CM | POA: Diagnosis present

## 2019-10-05 DIAGNOSIS — N1832 Chronic kidney disease, stage 3b: Secondary | ICD-10-CM | POA: Diagnosis present

## 2019-10-05 DIAGNOSIS — Z6836 Body mass index (BMI) 36.0-36.9, adult: Secondary | ICD-10-CM

## 2019-10-05 DIAGNOSIS — I130 Hypertensive heart and chronic kidney disease with heart failure and stage 1 through stage 4 chronic kidney disease, or unspecified chronic kidney disease: Secondary | ICD-10-CM | POA: Diagnosis present

## 2019-10-05 DIAGNOSIS — G4733 Obstructive sleep apnea (adult) (pediatric): Secondary | ICD-10-CM | POA: Diagnosis present

## 2019-10-05 DIAGNOSIS — I480 Paroxysmal atrial fibrillation: Secondary | ICD-10-CM | POA: Diagnosis not present

## 2019-10-05 DIAGNOSIS — Z7983 Long term (current) use of bisphosphonates: Secondary | ICD-10-CM

## 2019-10-05 DIAGNOSIS — J188 Other pneumonia, unspecified organism: Secondary | ICD-10-CM | POA: Diagnosis present

## 2019-10-05 LAB — BASIC METABOLIC PANEL
Anion gap: 10 (ref 5–15)
BUN: 40 mg/dL — ABNORMAL HIGH (ref 8–23)
CO2: 19 mmol/L — ABNORMAL LOW (ref 22–32)
Calcium: 8.9 mg/dL (ref 8.9–10.3)
Chloride: 112 mmol/L — ABNORMAL HIGH (ref 98–111)
Creatinine, Ser: 1.46 mg/dL — ABNORMAL HIGH (ref 0.44–1.00)
GFR calc Af Amer: 38 mL/min — ABNORMAL LOW (ref >60)
GFR calc non Af Amer: 33 mL/min — ABNORMAL LOW (ref >60)
Glucose, Bld: 212 mg/dL — ABNORMAL HIGH (ref 70–99)
Potassium: 5.2 mmol/L — ABNORMAL HIGH (ref 3.5–5.1)
Sodium: 141 mmol/L (ref 135–145)

## 2019-10-05 LAB — TROPONIN I (HIGH SENSITIVITY)
Troponin I (High Sensitivity): 9 ng/L (ref <18)
Troponin I (High Sensitivity): 10 ng/L (ref <18)
Troponin I (High Sensitivity): 10 ng/L (ref <18)

## 2019-10-05 LAB — CBC WITH DIFFERENTIAL/PLATELET
Abs Immature Granulocytes: 0.04 10*3/uL (ref 0.00–0.07)
Basophils Absolute: 0 10*3/uL (ref 0.0–0.1)
Basophils Relative: 0 %
Eosinophils Absolute: 0 10*3/uL (ref 0.0–0.5)
Eosinophils Relative: 0 %
HCT: 33.1 % — ABNORMAL LOW (ref 36.0–46.0)
Hemoglobin: 9.7 g/dL — ABNORMAL LOW (ref 12.0–15.0)
Immature Granulocytes: 0 %
Lymphocytes Relative: 4 %
Lymphs Abs: 0.4 10*3/uL — ABNORMAL LOW (ref 0.7–4.0)
MCH: 26.6 pg (ref 26.0–34.0)
MCHC: 29.3 g/dL — ABNORMAL LOW (ref 30.0–36.0)
MCV: 90.9 fL (ref 80.0–100.0)
Monocytes Absolute: 0.8 10*3/uL (ref 0.1–1.0)
Monocytes Relative: 8 %
Neutro Abs: 8.7 10*3/uL — ABNORMAL HIGH (ref 1.7–7.7)
Neutrophils Relative %: 88 %
Platelets: 265 10*3/uL (ref 150–400)
RBC: 3.64 MIL/uL — ABNORMAL LOW (ref 3.87–5.11)
RDW: 15.2 % (ref 11.5–15.5)
WBC: 9.9 10*3/uL (ref 4.0–10.5)
nRBC: 0 % (ref 0.0–0.2)

## 2019-10-05 LAB — BRAIN NATRIURETIC PEPTIDE: B Natriuretic Peptide: 248 pg/mL — ABNORMAL HIGH (ref 0.0–100.0)

## 2019-10-05 LAB — RESPIRATORY PANEL BY RT PCR (FLU A&B, COVID)
Influenza A by PCR: NEGATIVE
Influenza B by PCR: NEGATIVE
SARS Coronavirus 2 by RT PCR: NEGATIVE

## 2019-10-05 LAB — PROCALCITONIN: Procalcitonin: <0.1 ng/mL

## 2019-10-05 LAB — LACTATE DEHYDROGENASE: LDH: 121 U/L (ref 98–192)

## 2019-10-05 LAB — LACTIC ACID, PLASMA: Lactic Acid, Venous: 1.2 mmol/L (ref 0.5–1.9)

## 2019-10-05 LAB — POC SARS CORONAVIRUS 2 AG -  ED: SARS Coronavirus 2 Ag: NEGATIVE

## 2019-10-05 LAB — FERRITIN: Ferritin: 48 ng/mL (ref 11–307)

## 2019-10-05 LAB — GLUCOSE, CAPILLARY
Glucose-Capillary: 215 mg/dL — ABNORMAL HIGH (ref 70–99)
Glucose-Capillary: 258 mg/dL — ABNORMAL HIGH (ref 70–99)

## 2019-10-05 MED ORDER — IPRATROPIUM-ALBUTEROL 0.5-2.5 (3) MG/3ML IN SOLN
3.0000 mL | Freq: Once | RESPIRATORY_TRACT | Status: DC
  Filled 2019-10-05: qty 3

## 2019-10-05 MED ORDER — SODIUM CHLORIDE 0.9 % IV SOLN
500.0000 mg | INTRAVENOUS | Status: AC
  Administered 2019-10-06 – 2019-10-10 (×5): 500 mg via INTRAVENOUS
  Filled 2019-10-05 (×5): qty 500

## 2019-10-05 MED ORDER — SODIUM CHLORIDE 0.9 % IV SOLN
1.0000 g | INTRAVENOUS | Status: DC
  Filled 2019-10-05: qty 10

## 2019-10-05 MED ORDER — FUROSEMIDE 10 MG/ML IJ SOLN
40.0000 mg | Freq: Two times a day (BID) | INTRAMUSCULAR | Status: DC
  Administered 2019-10-05: 40 mg via INTRAVENOUS
  Filled 2019-10-05: qty 4

## 2019-10-05 MED ORDER — ATORVASTATIN CALCIUM 80 MG PO TABS
80.0000 mg | ORAL_TABLET | Freq: Every day | ORAL | Status: DC
  Administered 2019-10-06 – 2019-10-11 (×6): 80 mg via ORAL
  Filled 2019-10-05 (×5): qty 1

## 2019-10-05 MED ORDER — SODIUM CHLORIDE 0.9 % IV SOLN
500.0000 mg | Freq: Once | INTRAVENOUS | Status: AC
  Administered 2019-10-05: 500 mg via INTRAVENOUS
  Filled 2019-10-05: qty 500

## 2019-10-05 MED ORDER — AMLODIPINE BESYLATE 5 MG PO TABS
5.0000 mg | ORAL_TABLET | Freq: Every day | ORAL | Status: DC
  Administered 2019-10-06 – 2019-10-11 (×6): 5 mg via ORAL
  Filled 2019-10-05 (×6): qty 1

## 2019-10-05 MED ORDER — ONDANSETRON HCL 4 MG/2ML IJ SOLN
4.0000 mg | Freq: Once | INTRAMUSCULAR | Status: DC
  Filled 2019-10-05: qty 2

## 2019-10-05 MED ORDER — METHYLPREDNISOLONE SODIUM SUCC 125 MG IJ SOLR
60.0000 mg | Freq: Once | INTRAMUSCULAR | Status: AC
  Administered 2019-10-05: 14:00:00 60 mg via INTRAVENOUS
  Filled 2019-10-05: qty 2

## 2019-10-05 MED ORDER — INSULIN ASPART 100 UNIT/ML ~~LOC~~ SOLN
0.0000 [IU] | Freq: Three times a day (TID) | SUBCUTANEOUS | Status: DC
  Administered 2019-10-06: 5 [IU] via SUBCUTANEOUS
  Administered 2019-10-06: 09:00:00 2 [IU] via SUBCUTANEOUS
  Administered 2019-10-06: 3 [IU] via SUBCUTANEOUS
  Administered 2019-10-07: 13:00:00 2 [IU] via SUBCUTANEOUS
  Administered 2019-10-07: 09:00:00 1 [IU] via SUBCUTANEOUS
  Administered 2019-10-07: 5 [IU] via SUBCUTANEOUS
  Administered 2019-10-08: 12:00:00 3 [IU] via SUBCUTANEOUS
  Administered 2019-10-08: 17:00:00 7 [IU] via SUBCUTANEOUS
  Administered 2019-10-08 – 2019-10-09 (×2): 1 [IU] via SUBCUTANEOUS
  Administered 2019-10-09: 12:00:00 2 [IU] via SUBCUTANEOUS
  Administered 2019-10-09: 18:00:00 5 [IU] via SUBCUTANEOUS
  Filled 2019-10-05 (×12): qty 1

## 2019-10-05 MED ORDER — INSULIN DETEMIR 100 UNIT/ML ~~LOC~~ SOLN
20.0000 [IU] | Freq: Two times a day (BID) | SUBCUTANEOUS | Status: DC
  Administered 2019-10-05 – 2019-10-11 (×12): 20 [IU] via SUBCUTANEOUS
  Filled 2019-10-05 (×15): qty 0.2

## 2019-10-05 MED ORDER — IPRATROPIUM-ALBUTEROL 0.5-2.5 (3) MG/3ML IN SOLN
3.0000 mL | Freq: Four times a day (QID) | RESPIRATORY_TRACT | Status: DC
  Administered 2019-10-05 – 2019-10-09 (×14): 3 mL via RESPIRATORY_TRACT
  Filled 2019-10-05 (×15): qty 3

## 2019-10-05 MED ORDER — SODIUM CHLORIDE 0.9 % IV SOLN
1.0000 g | INTRAVENOUS | Status: DC
  Administered 2019-10-05: 1 g via INTRAVENOUS
  Filled 2019-10-05: qty 10

## 2019-10-05 MED ORDER — CLONIDINE HCL 0.1 MG PO TABS
0.1000 mg | ORAL_TABLET | Freq: Two times a day (BID) | ORAL | Status: DC
  Administered 2019-10-05 – 2019-10-11 (×12): 0.1 mg via ORAL
  Filled 2019-10-05 (×12): qty 1

## 2019-10-05 MED ORDER — APIXABAN 2.5 MG PO TABS
2.5000 mg | ORAL_TABLET | Freq: Two times a day (BID) | ORAL | Status: DC
  Administered 2019-10-05 – 2019-10-11 (×12): 2.5 mg via ORAL
  Filled 2019-10-05 (×12): qty 1

## 2019-10-05 MED ORDER — PANTOPRAZOLE SODIUM 40 MG PO TBEC
40.0000 mg | DELAYED_RELEASE_TABLET | Freq: Every day | ORAL | Status: DC
  Administered 2019-10-05 – 2019-10-10 (×6): 40 mg via ORAL
  Filled 2019-10-05 (×6): qty 1

## 2019-10-05 MED ORDER — SENNOSIDES-DOCUSATE SODIUM 8.6-50 MG PO TABS
2.0000 | ORAL_TABLET | Freq: Two times a day (BID) | ORAL | Status: DC
  Administered 2019-10-05 – 2019-10-11 (×11): 2 via ORAL
  Filled 2019-10-05 (×12): qty 2

## 2019-10-05 MED ORDER — INSULIN ASPART 100 UNIT/ML ~~LOC~~ SOLN
0.0000 [IU] | Freq: Every day | SUBCUTANEOUS | Status: DC
  Administered 2019-10-05: 22:00:00 3 [IU] via SUBCUTANEOUS
  Administered 2019-10-06: 21:00:00 5 [IU] via SUBCUTANEOUS
  Filled 2019-10-05 (×3): qty 1

## 2019-10-05 NOTE — H&P (Signed)
History and Physical:    Dorothy [name]   FBP:[id number] DOB: 02-26-1935 DOA: 10/05/2019  Referring MD/provider: Sherrie George, NP PCP: Letta Median, MD   Patient coming from: Home  Chief Complaint: Shortness of breath  History of Present Illness:   Dorothy [name] is an 85 y.o. female with medical history significant for chronic diastolic CHF, COPD, diabetes mellitus, CAD, hypertension, hyperlipidemia, obstructive sleep apnea, arthritis, anemia, presented to the hospital because of shortness of breath.  She said she started feeling short of breath about 2 days ago. This has progressively worsened.  She said she has trouble laying down flat and had a hard time sleeping last night.  She has also been wheezing for the past 2 days.  No cough, chest pain, fever, palpitations, vomiting, diarrhea, abdominal pain or urinary symptoms.  ED Course:  The patient was tachypneic, hypertensive and hypoxemic in the emergency room.  Oxygen saturation was 78% on room air.  Procalcitonin and lactic acid levels were normal.  Covid test was negative.  BNP was elevated at 248.  She was placed on 4 L oxygen via nasal cannula.  He was also given IV Solu-Medrol, IV azithromycin and IV Rocephin in the ER.  ROS:   ROS all other systems reviewed were negative  Past Medical History:   Past Medical History:  Diagnosis Date  . Anemia   . Arthritis    hands, knees, back  . Atrial fibrillation ([place])   . CHF (congestive heart failure) ([place])   . COPD (chronic obstructive pulmonary disease) ([place])   . Diabetes mellitus without complication ([place])   . Dyspnea   . GERD (gastroesophageal reflux disease)   . Hyperlipidemia   . Hypertension   . PONV (postoperative nausea and vomiting)   . Sleep apnea    told she needs CPAP, does not have  . Wears dentures    full upper and lower.  only wears upper    Past Surgical History:   Past Surgical History:  Procedure Laterality Date  . ABDOMINAL  HYSTERECTOMY    . CATARACT EXTRACTION W/PHACO Right 07/12/2016   Procedure: CATARACT EXTRACTION PHACO AND INTRAOCULAR LENS PLACEMENT (IOC);  Surgeon: Leandrew Koyanagi, MD;  Location: [facility];  Service: Ophthalmology;  Laterality: Right;  DIABETIC - insulin and oral meds sleep apnea  . CATARACT EXTRACTION W/PHACO Left 04/09/2019   Procedure: CATARACT EXTRACTION PHACO AND INTRAOCULAR LENS PLACEMENT ([place])  LEFT DIABETIC;  Surgeon: Leandrew Koyanagi, MD;  Location: [facility];  Service: Ophthalmology;  Laterality: Left;  Diabetic - insulin and oral meds  . CHOLECYSTECTOMY    . COLONOSCOPY N/A 01/19/2015   Procedure: COLONOSCOPY;  Surgeon: Josefine Class, MD;  Location: [facility];  Service: Endoscopy;  Laterality: N/A;  . COLONOSCOPY WITH PROPOFOL N/A 07/11/2017   Procedure: COLONOSCOPY WITH PROPOFOL;  Surgeon: Toledo, Benay Pike, MD;  Location: [facility] ENDOSCOPY;  Service: Gastroenterology;  Laterality: N/A;  . ESOPHAGOGASTRODUODENOSCOPY N/A 01/19/2015   Procedure: ESOPHAGOGASTRODUODENOSCOPY (EGD);  Surgeon: Josefine Class, MD;  Location: [facility] ENDOSCOPY;  Service: Endoscopy;  Laterality: N/A;  . ESOPHAGOGASTRODUODENOSCOPY (EGD) WITH PROPOFOL N/A 07/11/2017   Procedure: ESOPHAGOGASTRODUODENOSCOPY (EGD) WITH PROPOFOL;  Surgeon: Toledo, Benay Pike, MD;  Location: [facility] ENDOSCOPY;  Service: Gastroenterology;  Laterality: N/A;  . SHOULDER ARTHROSCOPY      Social History:   Social History   Socioeconomic History  . Marital status: Widowed    Spouse name: Not on file  . Number of children: Not on file  .  Years of education: Not on file  . Highest education level: Not on file  Occupational History  . Not on file  Tobacco Use  . Smoking status: Never Smoker  . Smokeless tobacco: Never Used  Substance and Sexual Activity  . Alcohol use: No  . Drug use: Not on file  . Sexual activity: Not on file  Other Topics Concern  . Not on file  Social History Narrative   . Not on file   Social Determinants of Health   Financial Resource Strain:   . Difficulty of Paying Living Expenses: Not on file  Food Insecurity:   . Worried About Charity fundraiser in the Last Year: Not on file  . Ran Out of Food in the Last Year: Not on file  Transportation Needs:   . Lack of Transportation (Medical): Not on file  . Lack of Transportation (Non-Medical): Not on file  Physical Activity:   . Days of Exercise per Week: Not on file  . Minutes of Exercise per Session: Not on file  Stress:   . Feeling of Stress : Not on file  Social Connections:   . Frequency of Communication with Friends and Family: Not on file  . Frequency of Social Gatherings with Friends and Family: Not on file  . Attends Religious Services: Not on file  . Active Member of Clubs or Organizations: Not on file  . Attends Archivist Meetings: Not on file  . Marital Status: Not on file  Intimate Partner Violence:   . Fear of Current or Ex-Partner: Not on file  . Emotionally Abused: Not on file  . Physically Abused: Not on file  . Sexually Abused: Not on file    Allergies   Tramadol and Aspirin  Family history:   History reviewed. No pertinent family history.  Current Medications:   Prior to Admission medications   Medication Sig Start Date End Date Taking? Authorizing Provider  ACCU-CHEK SOFTCLIX LANCETS lancets  02/03/15   [provider]  acetaminophen (TYLENOL) 500 MG tablet Take 1,000 mg by mouth every 6 (six) hours as needed for moderate pain or headache.    [provider]  albuterol (PROVENTIL HFA;VENTOLIN HFA) 108 (90 BASE) MCG/ACT inhaler Inhale 2 puffs into the lungs every 6 (six) hours as needed for wheezing or shortness of breath.    [provider]  alendronate (FOSAMAX) 70 MG tablet Take by mouth.    [provider]  amLODipine (NORVASC) 5 MG tablet  03/03/19   [provider]  apixaban (ELIQUIS) 2.5 MG TABS tablet Take  by mouth. 09/27/17   [provider]  atorvastatin (LIPITOR) 80 MG tablet Take 80 mg by mouth daily.    [provider]  Calcium Carb-Cholecalciferol (CALCIUM 600 + D PO) Take 1 tablet by mouth.    [provider]  cloNIDine (CATAPRES) 0.1 MG tablet Take 0.1 mg by mouth 2 (two) times daily.    [provider]  cyanocobalamin (,VITAMIN B-12,) 1000 MCG/ML injection Inject into the muscle.    [provider]  esomeprazole (NEXIUM) 20 MG capsule Take 20 mg by mouth at bedtime.    [provider]  fluticasone Asencion Islam) 50 MCG/ACT nasal spray  06/29/15   [provider]  furosemide (LASIX) 20 MG tablet Take 20 mg by mouth 2 (two) times daily.    [provider]  insulin NPH Human (HUMULIN N,NOVOLIN N) 100 UNIT/ML injection Inject 0.23 mLs (23 Units total) into the skin  2 (two) times daily before a meal. Patient taking differently: Inject 25 Units into the skin 2 (two) times daily before a meal. (04/07/19 20 u AM, 15 u PM) 12/22/16   Gladstone Lighter, MD  ipratropium-albuterol (DUONEB) 0.5-2.5 (3) MG/3ML SOLN Take 3 mLs by nebulization every 6 (six) hours as needed. 12/22/16   Gladstone Lighter, MD  lisinopril (PRINIVIL,ZESTRIL) 10 MG tablet Take 10 mg by mouth daily.    [provider]  metFORMIN (GLUMETZA) 1000 MG (MOD) 24 hr tablet Take 1,000 mg by mouth 2 (two) times daily.    [provider]  potassium chloride (K-DUR) 10 MEQ tablet Take 10 mEq by mouth 2 (two) times daily.    [provider]  senna-docusate (SENOKOT-S) 8.6-50 MG tablet Take 2 tablets by mouth 2 (two) times daily. 12/22/16   Gladstone Lighter, MD    Physical Exam:   Vitals:   10/05/19 1400 10/05/19 1500 10/05/19 1600 10/05/19 1645  BP: (!) 147/63 (!) 136/53 (!) 143/45 137/64  Pulse: 83 78 83 79  Resp: (!) 23 (!) 23 (!) 24 (!) 22  Temp:      TempSrc:      SpO2: 95% 94% 94% 94%  Weight:      Height:         Physical  Exam: Blood pressure 137/64, pulse 79, temperature 98.8 F (37.1 C), temperature source Oral, resp. rate (!) 22, height 5\' 1"  (1.549 m), weight 88.5 kg, SpO2 94 %. Gen: No acute distress. Head: Normocephalic, atraumatic. Eyes: Pupils equal, round and reactive to light. Extraocular movements intact.  Sclerae nonicteric.  Mouth: Moist mucous membranes Neck: Supple, no thyromegaly, no lymphadenopathy, no jugular venous distention. Chest: Air entry adequate bilaterally.  Bibasilar rales, bilateral expiratory wheezing. CV: Heart sounds are regular with an S1, S2. No murmurs, rubs or gallops.  Abdomen: Soft, nontender, nondistended with normal active bowel sounds. No palpable masses. Extremities: Extremities are without clubbing, or cyanosis.  Bilateral trace leg and pedal edema.  Pedal pulses 2+.  Skin: Warm and dry. No rash Neuro: Alert and oriented times 3; grossly nonfocal.  Psych: Insight is good and judgment is appropriate. Mood and affect normal.   Data Review:    Labs: Basic Metabolic Panel: Recent Labs  Lab 10/05/19 1304  NA 141  K 5.2*  CL 112*  CO2 19*  GLUCOSE 212*  BUN 40*  CREATININE 1.46*  CALCIUM 8.9   Liver Function Tests: No results for input(s): AST, ALT, ALKPHOS, BILITOT, PROT, ALBUMIN in the last 168 hours. No results for input(s): LIPASE, AMYLASE in the last 168 hours. No results for input(s): AMMONIA in the last 168 hours. CBC: Recent Labs  Lab 10/03/19 0956 10/05/19 1304  WBC 9.0 9.9  NEUTROABS 7.4 8.7*  HGB 9.4* 9.7*  HCT 32.0* 33.1*  MCV 93.3 90.9  PLT 292 265   Cardiac Enzymes: No results for input(s): CKTOTAL, CKMB, CKMBINDEX, TROPONINI in the last 168 hours.  BNP (last 3 results) No results for input(s): PROBNP in the last 8760 hours. CBG: No results for input(s): GLUCAP in the last 168 hours.  Urinalysis    Component Value Date/Time   COLORURINE Yellow 01/10/2012 0527   APPEARANCEUR Hazy 01/10/2012 0527   LABSPEC 1.012 01/10/2012  0527   PHURINE 5.0 01/10/2012 0527   GLUCOSEU 150 mg/dL 01/10/2012 0527   HGBUR Negative 01/10/2012 0527   BILIRUBINUR Negative 01/10/2012 0527   KETONESUR Negative 01/10/2012 0527   PROTEINUR Negative 01/10/2012 0527   NITRITE Positive 01/10/2012  4431   LEUKOCYTESUR Negative 01/10/2012 0527      Radiographic Studies: DG Chest Port 1 View  Result Date: 10/05/2019 CLINICAL DATA:  Shortness of breath, worsening. EXAM: PORTABLE CHEST 1 VIEW COMPARISON:  Chest x-ray dated 12/21/2016. FINDINGS: Stable cardiomegaly. Patchy bilateral airspace opacities, LEFT greater than RIGHT, suspected pneumonia superimposed on chronic interstitial lung disease. Probable small LEFT pleural effusion. Osseous structures about the chest are unremarkable. IMPRESSION: 1. Suspected multifocal/bilateral pneumonia superimposed on chronic interstitial lung disease. 2. Probable small LEFT pleural effusion. 3. Stable cardiomegaly. Electronically Signed   By: Franki Cabot M.D.   On: 10/05/2019 13:36    EKG: Independently reviewed.  Atrial fibrillation   Assessment/Plan:   Principal Problem:   Multifocal pneumonia Active Problems:   Acute hypoxemic respiratory failure (HCC)    Body mass index is 36.84 kg/m.  (Morbid obesity)  Patchy bilateral airspace opacities on chest x-ray: Admit to telemetry.  Differential diagnosis include multifocal pneumonia versus acute exacerbation of chronic diastolic CHF.  Treat with empiric IV antibiotics and IV Lasix.  Repeat chest x-ray tomorrow.  Monitor daily weights, BMP and urine output.  Follow-up blood cultures.  Acute hypoxemic respiratory failure: Continue 4 L/min oxygen via nasal cannula  Mild hyperkalemia: Discontinue potassium supplement.  Expected to improve with IV Lasix.  Elevated BUN/creatinine: Probably due to underlying CKD stage IIIb.  Monitor renal function.  Insulin-dependent diabetes mellitus: Continue Novolin  Hypertension: Hold lisinopril because of  hyperkalemia.  Continue amlodipine and clonidine  COPD: She was given IV Solu-Medrol in the ER.  Treat with bronchodilators.  Atrial fibrillation: Continue Eliquis   Other information:   DVT prophylaxis: Eliquis Code Status: Full code. Family Communication: Plan discussed with the patient Disposition Plan: Possible discharge to home in 2 to 3 days Consults called: None Admission status: Inpatient  The medical decision making on this patient was of high complexity and the patient is at high risk for clinical deterioration, therefore this is a level 3 visit.   Time spent 55 minutes  [facility]   How to contact the [facility] Attending or Consulting provider [place] or covering provider during after hours [place], for this patient?   1. Check the care team in [facility] and look for a) attending/consulting TRH provider listed and b) the [facility] team listed 2. Log into [email or web address] and use 's universal password to access. If you do not have the password, please contact the hospital operator. 3. Locate the [facility] provider you are looking for under [facility] and page to a number that you can be directly reached. 4. If you still have difficulty reaching the provider, please page the [facility] (Director on Call) for the Hospitalists listed on amion for assistance.  10/05/2019, 4:59 PM

## 2019-10-05 NOTE — Progress Notes (Signed)
Admit 85 y/o female from ED with c/o SOB dc Pneumonia. Patient arrived to room in stable condition AAOx4,. Able to ambulate from ED stretcher to bed.Pt states she lives at home alone. IV-20G noted to [place]. Oriented to room. Bed in low position. Call bell in reach.

## 2019-10-05 NOTE — ED Provider Notes (Signed)
[facility] Emergency Department Provider Note ____________________________________________   First MD Initiated Contact with Patient 10/05/19 1304     (approximate)  I have reviewed the triage vital signs and the nursing notes.   HISTORY  Chief Complaint Shortness of Breath  HPI Dorothy [name] is a 85 y.o. female who presents to the emergency department for treatment and evaluation of shortness of breath and bilateral lower extremity swelling.  Patient states symptoms started 2 days ago.  She does have a significant past medical history of CHF, COPD, and A. Fib.  She denies chest pain.  She reports that she has been taking her medications including her Lasix and Eliquis as prescribed.  She states that when she picked her prescriptions up from the pharmacy last time, her Lasix had been reduced to 1/day.  She states that she took 2 Lasix yesterday but has continued to have increasing shortness of breath and lower extremity swelling.  Her initial oxygen saturation on room air was 78%.  She was placed on 4 L and is now 91%.      Past Medical History:  Diagnosis Date  . Anemia   . Arthritis    hands, knees, back  . Atrial fibrillation ([place])   . CHF (congestive heart failure) ([place])   . COPD (chronic obstructive pulmonary disease) ([place])   . Diabetes mellitus without complication ([place])   . Dyspnea   . GERD (gastroesophageal reflux disease)   . Hyperlipidemia   . Hypertension   . PONV (postoperative nausea and vomiting)   . Sleep apnea    told she needs CPAP, does not have  . Wears dentures    full upper and lower.  only wears upper    Patient Active Problem List   Diagnosis Date Noted  . Diabetes ([place]) 05/04/2017  . Iron deficiency anemia 12/28/2016  . Chronic diastolic heart failure ([place]) 12/26/2016  . HTN (hypertension) 12/26/2016  . Atrial fibrillation ([place]) 12/26/2016  . B12 deficiency   . Sepsis ([place]) 12/18/2016  . CAP (community acquired  pneumonia) 12/18/2016  . Anemia 12/18/2016    Past Surgical History:  Procedure Laterality Date  . ABDOMINAL HYSTERECTOMY    . CATARACT EXTRACTION W/PHACO Right 07/12/2016   Procedure: CATARACT EXTRACTION PHACO AND INTRAOCULAR LENS PLACEMENT (IOC);  Surgeon: Leandrew Koyanagi, MD;  Location: [place];  Service: Ophthalmology;  Laterality: Right;  DIABETIC - insulin and oral meds sleep apnea  . CATARACT EXTRACTION W/PHACO Left 04/09/2019   Procedure: CATARACT EXTRACTION PHACO AND INTRAOCULAR LENS PLACEMENT ([place])  LEFT DIABETIC;  Surgeon: Leandrew Koyanagi, MD;  Location: [place];  Service: Ophthalmology;  Laterality: Left;  Diabetic - insulin and oral meds  . CHOLECYSTECTOMY    . COLONOSCOPY N/A 01/19/2015   Procedure: COLONOSCOPY;  Surgeon: Josefine Class, MD;  Location: [facility] ENDOSCOPY;  Service: Endoscopy;  Laterality: N/A;  . COLONOSCOPY WITH PROPOFOL N/A 07/11/2017   Procedure: COLONOSCOPY WITH PROPOFOL;  Surgeon: Toledo, Benay Pike, MD;  Location: [facility] ENDOSCOPY;  Service: Gastroenterology;  Laterality: N/A;  . ESOPHAGOGASTRODUODENOSCOPY N/A 01/19/2015   Procedure: ESOPHAGOGASTRODUODENOSCOPY (EGD);  Surgeon: Josefine Class, MD;  Location: [facility] ENDOSCOPY;  Service: Endoscopy;  Laterality: N/A;  . ESOPHAGOGASTRODUODENOSCOPY (EGD) WITH PROPOFOL N/A 07/11/2017   Procedure: ESOPHAGOGASTRODUODENOSCOPY (EGD) WITH PROPOFOL;  Surgeon: Toledo, Benay Pike, MD;  Location: [facility] ENDOSCOPY;  Service: Gastroenterology;  Laterality: N/A;  . SHOULDER ARTHROSCOPY      Prior to Admission medications   Medication Sig Start Date End Date  Taking? Authorizing Provider  ACCU-CHEK SOFTCLIX LANCETS lancets  02/03/15   [provider]  acetaminophen (TYLENOL) 500 MG tablet Take 1,000 mg by mouth every 6 (six) hours as needed for moderate pain or headache.    [provider]  albuterol (PROVENTIL HFA;VENTOLIN HFA) 108 (90 BASE) MCG/ACT inhaler Inhale 2 puffs into  the lungs every 6 (six) hours as needed for wheezing or shortness of breath.    [provider]  alendronate (FOSAMAX) 70 MG tablet Take by mouth.    [provider]  amLODipine (NORVASC) 5 MG tablet  03/03/19   [provider]  apixaban (ELIQUIS) 2.5 MG TABS tablet Take by mouth. 09/27/17   [provider]  atorvastatin (LIPITOR) 80 MG tablet Take 80 mg by mouth daily.    [provider]  Calcium Carb-Cholecalciferol (CALCIUM 600 + D PO) Take 1 tablet by mouth.    [provider]  cloNIDine (CATAPRES) 0.1 MG tablet Take 0.1 mg by mouth 2 (two) times daily.    [provider]  cyanocobalamin (,VITAMIN B-12,) 1000 MCG/ML injection Inject into the muscle.    [provider]  esomeprazole (NEXIUM) 20 MG capsule Take 20 mg by mouth at bedtime.    [provider]  fluticasone Asencion Islam) 50 MCG/ACT nasal spray  06/29/15   [provider]  furosemide (LASIX) 20 MG tablet Take 20 mg by mouth 2 (two) times daily.    [provider]  insulin NPH Human (HUMULIN N,NOVOLIN N) 100 UNIT/ML injection Inject 0.23 mLs (23 Units total) into the skin 2 (two) times daily before a meal. Patient taking differently: Inject 25 Units into the skin 2 (two) times daily before a meal. (04/07/19 20 u AM, 15 u PM) 12/22/16   Gladstone Lighter, MD  ipratropium-albuterol (DUONEB) 0.5-2.5 (3) MG/3ML SOLN Take 3 mLs by nebulization every 6 (six) hours as needed. 12/22/16   Gladstone Lighter, MD  lisinopril (PRINIVIL,ZESTRIL) 10 MG tablet Take 10 mg by mouth daily.    [provider]  metFORMIN (GLUMETZA) 1000 MG (MOD) 24 hr tablet Take 1,000 mg by mouth 2 (two) times daily.    [provider]  potassium chloride (K-DUR) 10 MEQ tablet Take 10 mEq by mouth 2 (two) times daily.    [provider]  senna-docusate (SENOKOT-S) 8.6-50 MG tablet Take 2 tablets by mouth 2 (two) times daily. 12/22/16   Gladstone Lighter, MD     Allergies Tramadol and Aspirin  History reviewed. No pertinent family history.  Social History Social History   Tobacco Use  . Smoking status: Never Smoker  . Smokeless tobacco: Never Used  Substance Use Topics  . Alcohol use: No  . Drug use: Not on file    Review of Systems  Constitutional: No fever/chills Eyes: No visual changes. ENT: No sore throat. Cardiovascular: Denies chest pain.  Positive for bilateral lower extremity pitting edema. Respiratory: Positive for shortness of breath. Gastrointestinal: No abdominal pain.  No nausea, no vomiting.  No diarrhea. Genitourinary: Negative for dysuria. Musculoskeletal: Negative for back pain. Skin: Negative for rash. Neurological: Negative for headaches, focal weakness or numbness. ____________________________________________   PHYSICAL EXAM:  VITAL SIGNS: ED Triage Vitals  Enc Vitals Group     BP 10/05/19 1251 (!) 159/74     Pulse Rate 10/05/19 1251 87     Resp 10/05/19 1251 (!) 22     Temp 10/05/19 1251 98.8 F (37.1 C)     Temp Source 10/05/19 1251 Oral  SpO2 10/05/19 1251 91 %     Weight 10/05/19 1252 195 lb (88.5 kg)     Height 10/05/19 1252 5\' 1"  (1.549 m)     Head Circumference --      Peak Flow --      Pain Score 10/05/19 1251 0     Pain Loc --      Pain Edu? --      Excl. in [place]? --     Constitutional: Alert and oriented. Well appearing and in no acute distress. Eyes: Conjunctivae are normal. PERRL. EOMI. Head: Atraumatic. Nose: No congestion/rhinnorhea. Mouth/Throat: Mucous membranes are moist.  Oropharynx non-erythematous. Neck: No stridor.   Hematological/Lymphatic/Immunilogical: No cervical lymphadenopathy. Cardiovascular: Normal rate, regular rhythm. Grossly normal heart sounds.  Good peripheral circulation. Respiratory: Normal respiratory effort.  No retractions. Wheezing in bilateral bases. Able to speak in complete sentence. Gastrointestinal: Soft and nontender. No distention. No  abdominal bruits. No CVA tenderness.  Musculoskeletal: Bilateral lower extremity pitting edema 3+. Neurologic:  Normal speech and language. No gross focal neurologic deficits are appreciated. No gait instability. Skin:  Skin is warm, dry and intact. No rash noted. Psychiatric: Mood and affect are normal. Speech and behavior are normal. ____________________________________________   LABS (all labs ordered are listed, but only abnormal results are displayed)  Labs Reviewed  CBC WITH DIFFERENTIAL/PLATELET - Abnormal; Notable for the following components:      Result Value   RBC 3.64 (*)    Hemoglobin 9.7 (*)    HCT 33.1 (*)    MCHC 29.3 (*)    Neutro Abs 8.7 (*)    Lymphs Abs 0.4 (*)    All other components within normal limits  BASIC METABOLIC PANEL - Abnormal; Notable for the following components:   Potassium 5.2 (*)    Chloride 112 (*)    CO2 19 (*)    Glucose, Bld 212 (*)    BUN 40 (*)    Creatinine, Ser 1.46 (*)    GFR calc non Af Amer 33 (*)    GFR calc Af Amer 38 (*)    All other components within normal limits  BRAIN NATRIURETIC PEPTIDE - Abnormal; Notable for the following components:   B Natriuretic Peptide 248.0 (*)    All other components within normal limits  RESPIRATORY PANEL BY RT PCR (FLU A&B, COVID)  CULTURE, BLOOD (ROUTINE X 2)  CULTURE, BLOOD (ROUTINE X 2)  LACTIC ACID, PLASMA  PROCALCITONIN  POC SARS CORONAVIRUS 2 AG -  ED  TROPONIN I (HIGH SENSITIVITY)   ____________________________________________  EKG  ED ECG REPORT I, Irva Loser, FNP-BC personally viewed and interpreted this ECG.   Date: 10/05/2019  EKG Time: 1258  Rate: 92  Rhythm: unchanged from previous tracings  Axis: rightward  Intervals:none  ST&T Change: no ST elevation  ____________________________________________  RADIOLOGY  ED MD interpretation:    Likely multifocal, bilateral pneumonia superimposed on chronic interstitial lung disease.   Official radiology  report(s): DG Chest Port 1 View  Result Date: 10/05/2019 CLINICAL DATA:  Shortness of breath, worsening. EXAM: PORTABLE CHEST 1 VIEW COMPARISON:  Chest x-ray dated 12/21/2016. FINDINGS: Stable cardiomegaly. Patchy bilateral airspace opacities, LEFT greater than RIGHT, suspected pneumonia superimposed on chronic interstitial lung disease. Probable small LEFT pleural effusion. Osseous structures about the chest are unremarkable. IMPRESSION: 1. Suspected multifocal/bilateral pneumonia superimposed on chronic interstitial lung disease. 2. Probable small LEFT pleural effusion. 3. Stable cardiomegaly. Electronically Signed   By: Franki Cabot M.D.   On: 10/05/2019 13:36  ____________________________________________   PROCEDURES  Procedure(s) performed (including Critical Care):  Procedures  ____________________________________________   INITIAL IMPRESSION / ASSESSMENT AND PLAN     85 year old female presenting to the emergency department for treatment and evaluation of shortness of breath with increasing lower extremity edema that has been worsening over the past 3 days.  See HPI for further details.  Initial plan will be to get labs, EKG, chest x-ray.  Patient denies chest pain.  She states that she lives alone and has not been exposed to COVID-19 that she is aware of.  DIFFERENTIAL DIAGNOSIS  CHF exacerbation, influenza, COVID-19,  ED COURSE  Rapid COVID-19 testing is negative, lactic acid is normal, CBC is reassuring with a white blood cell count of 9.9 she is hyperkalemic at 5.2 with a BUN of 40 and creatinine of 1.4.  This does not appear to be chronic elevation.  Chest x-ray shows bilateral, multifocal pneumonia superimposed on chronic interstitial lung disease.  She remains on 4 L of oxygen.   ----------------------------------------- 2:42 PM on 10/05/2019 -----------------------------------------  Patient feeling nauseated. Will order zofran. RN reports that her heart rate  decreased to 44bpm, but came back up to 90's within a few seconds. She denies chest pain or change in shortness of breath. Oxygen saturation is currently 90% on 4 liters while at rest. Patient advised of plan for admission and agrees to stay. X-ray and lab results were reviewed with the patient. Awaiting result of BNP and troponin. Will admit afterward.  ----------------------------------------- 4:17 PM on 10/05/2019 -----------------------------------------  Consult to hospitalist for admission. Currently SpO2 of 94% on 4 liters O2.  ----------------------------------------- 4:33 PM on 10/05/2019 -----------------------------------------  Accepted for admission by Hospitalist.  Dorothy [name] was evaluated in Emergency Department on 10/05/2019 for the symptoms described in the history of present illness. She was evaluated in the context of the global COVID-19 pandemic, which necessitated consideration that the patient might be at risk for infection with the SARS-CoV-2 virus that causes COVID-19. Institutional protocols and algorithms that pertain to the evaluation of patients at risk for COVID-19 are in a state of rapid change based on information released by regulatory bodies including the CDC and federal and state organizations. These policies and algorithms were followed during the patient's care in the ED.  CRITICAL CARE Performed by: Sherrie George   Total critical care time: 45 minutes  Critical care time was exclusive of separately billable procedures and treating other patients.  Critical care was necessary to treat or prevent imminent or life-threatening deterioration.  Critical care was time spent personally by me on the following activities: development of treatment plan with patient and/or surrogate as well as nursing, discussions with consultants, evaluation of patient's response to treatment, examination of patient, obtaining history from patient or surrogate, ordering and  performing treatments and interventions, ordering and review of laboratory studies, ordering and review of radiographic studies, pulse oximetry and re-evaluation of patient's condition.  ____________________________________________   FINAL CLINICAL IMPRESSION(S) / ED DIAGNOSES  Final diagnoses:  Pneumonia of left lung due to infectious organism, unspecified part of lung  Pneumonia of right lung due to infectious organism, unspecified part of lung  Hypoxia     ED Discharge Orders    None       Note:  This document was prepared using Dragon voice recognition software and may include unintentional dictation errors.   Victorino Dike, FNP 10/05/19 1633    Lavonia Drafts, MD 10/06/19 1311

## 2019-10-05 NOTE — ED Triage Notes (Signed)
Pt to ED with c/o of SOB. Pt states started feeling SOB 2 days ago and has progressively gotten worse. Upon ED arrival pt sat 78%. Pt also has bilateral swelling in both legs. Pt placed on 4L of O2 and Sat 92%.

## 2019-10-05 NOTE — Progress Notes (Signed)
CBG 215

## 2019-10-06 ENCOUNTER — Encounter: Payer: Self-pay | Admitting: Oncology

## 2019-10-06 ENCOUNTER — Inpatient Hospital Stay: Payer: Medicare Other

## 2019-10-06 LAB — GLUCOSE, CAPILLARY
Glucose-Capillary: 183 mg/dL — ABNORMAL HIGH (ref 70–99)
Glucose-Capillary: 212 mg/dL — ABNORMAL HIGH (ref 70–99)
Glucose-Capillary: 293 mg/dL — ABNORMAL HIGH (ref 70–99)
Glucose-Capillary: 379 mg/dL — ABNORMAL HIGH (ref 70–99)

## 2019-10-06 MED ORDER — SODIUM CHLORIDE 0.9 % IV SOLN
INTRAVENOUS | Status: DC | PRN
  Administered 2019-10-06: 14:00:00 250 mL via INTRAVENOUS
  Administered 2019-10-10: 14:00:00 75 mL via INTRAVENOUS

## 2019-10-06 MED ORDER — FUROSEMIDE 10 MG/ML IJ SOLN
40.0000 mg | Freq: Every day | INTRAMUSCULAR | Status: DC
  Administered 2019-10-06 – 2019-10-11 (×6): 40 mg via INTRAVENOUS
  Filled 2019-10-06 (×6): qty 4

## 2019-10-06 MED ORDER — PREDNISONE 20 MG PO TABS
40.0000 mg | ORAL_TABLET | Freq: Every day | ORAL | Status: DC
  Administered 2019-10-06 – 2019-10-10 (×5): 40 mg via ORAL
  Filled 2019-10-06 (×5): qty 2

## 2019-10-06 NOTE — Progress Notes (Signed)
Hematology/Oncology Consult note [facility]  [phone] [phone]  Patient Care Team: Letta Median, MD as PCP - General Jackelyn Hoehn, Aura Fey, FNP as Nurse Practitioner (Family Medicine) Isaias Cowman, MD as Consulting Physician (Cardiology)   Name of the patient: Ta Fair  [id number]  July 02, 1935   Date of visit: 10/06/19  Diagnosis- iron deficiencyand b12 deficiencyanemia  Chief complaint/ Reason for visit- routine f/u of anemia  Heme/Onc history: patient is a 85 year old female who was seen by Dr. Ouida Sills 2016 deficiency anemia. At that time she had an EGD and a colonoscopy which did not reveal any evidence of bleeding. She didn't get better with oral iron and was eventually asked to follow-up with her primary care doctor.She does have a remote h/o Billroth I gastro duodenostomy  Patient was admitted to the hospital for severe allergy angioedema and was found to have a pneumonia and heart failurein April 2018. Her hemoglobin was 7.3 with an MCV of 68. She has received 2 doses ofvenofer200 mg along with one unit of blood transfusion. She was seen by Dr. Alice Reichert and underwent EGD and colonoscopy in October 2018. EGD showed evidence of billroth I but ws otherwise normal. Colonoscopy showed diverticulosis, internal and external hemorrhoids and 1 polyp (tubular adenoma). No active bleeding  Iron studies from 12/19/2016 showed a low serum iron of 13 and low iron saturation of 3% and normal TIBC of 426. Ferritin was low at 9 and B12 was normal at 151. Patient has receivedvenofer and B12 injections inthe past   Interval history- reports chronic fatigue and exertional sob. Denies any blood in stool or urine  ECOG PS- 2 Pain scale- 0   Review of systems- Review of Systems  Constitutional: Positive for malaise/fatigue. Negative for chills, fever and weight loss.  HENT: Negative for congestion, ear discharge and  nosebleeds.   Eyes: Negative for blurred vision.  Respiratory: Positive for shortness of breath. Negative for cough, hemoptysis, sputum production and wheezing.   Cardiovascular: Negative for chest pain, palpitations, orthopnea and claudication.  Gastrointestinal: Negative for abdominal pain, blood in stool, constipation, diarrhea, heartburn, melena, nausea and vomiting.  Genitourinary: Negative for dysuria, flank pain, frequency, hematuria and urgency.  Musculoskeletal: Negative for back pain, joint pain and myalgias.  Skin: Negative for rash.  Neurological: Negative for dizziness, tingling, focal weakness, seizures, weakness and headaches.  Endo/Heme/Allergies: Does not bruise/bleed easily.  Psychiatric/Behavioral: Negative for depression and suicidal ideas. The patient does not have insomnia.       Allergies  Allergen Reactions  . Tramadol Anaphylaxis  . Aspirin     "Stomach problems"     Past Medical History:  Diagnosis Date  . Anemia   . Arthritis    hands, knees, back  . Atrial fibrillation ([place])   . CHF (congestive heart failure) ([place])   . COPD (chronic obstructive pulmonary disease) ([place])   . Diabetes mellitus without complication ([place])   . Dyspnea   . GERD (gastroesophageal reflux disease)   . Hyperlipidemia   . Hypertension   . PONV (postoperative nausea and vomiting)   . Sleep apnea    told she needs CPAP, does not have  . Wears dentures    full upper and lower.  only wears upper     Past Surgical History:  Procedure Laterality Date  . ABDOMINAL HYSTERECTOMY    . CATARACT EXTRACTION W/PHACO Right 07/12/2016   Procedure: CATARACT EXTRACTION PHACO AND INTRAOCULAR LENS PLACEMENT (IOC);  Surgeon: Leandrew Koyanagi, MD;  Location: [place];  Service: Ophthalmology;  Laterality: Right;  DIABETIC - insulin and oral meds sleep apnea  . CATARACT EXTRACTION W/PHACO Left 04/09/2019   Procedure: CATARACT EXTRACTION PHACO AND INTRAOCULAR LENS PLACEMENT ([place])   LEFT DIABETIC;  Surgeon: Leandrew Koyanagi, MD;  Location: [place];  Service: Ophthalmology;  Laterality: Left;  Diabetic - insulin and oral meds  . CHOLECYSTECTOMY    . COLONOSCOPY N/A 01/19/2015   Procedure: COLONOSCOPY;  Surgeon: Josefine Class, MD;  Location: [facility] ENDOSCOPY;  Service: Endoscopy;  Laterality: N/A;  . COLONOSCOPY WITH PROPOFOL N/A 07/11/2017   Procedure: COLONOSCOPY WITH PROPOFOL;  Surgeon: Toledo, Benay Pike, MD;  Location: [facility] ENDOSCOPY;  Service: Gastroenterology;  Laterality: N/A;  . ESOPHAGOGASTRODUODENOSCOPY N/A 01/19/2015   Procedure: ESOPHAGOGASTRODUODENOSCOPY (EGD);  Surgeon: Josefine Class, MD;  Location: [facility] ENDOSCOPY;  Service: Endoscopy;  Laterality: N/A;  . ESOPHAGOGASTRODUODENOSCOPY (EGD) WITH PROPOFOL N/A 07/11/2017   Procedure: ESOPHAGOGASTRODUODENOSCOPY (EGD) WITH PROPOFOL;  Surgeon: Toledo, Benay Pike, MD;  Location: [facility] ENDOSCOPY;  Service: Gastroenterology;  Laterality: N/A;  . SHOULDER ARTHROSCOPY      Social History   Socioeconomic History  . Marital status: Widowed    Spouse name: Not on file  . Number of children: Not on file  . Years of education: Not on file  . Highest education level: Not on file  Occupational History  . Not on file  Tobacco Use  . Smoking status: Never Smoker  . Smokeless tobacco: Never Used  Substance and Sexual Activity  . Alcohol use: No  . Drug use: Not on file  . Sexual activity: Not on file  Other Topics Concern  . Not on file  Social History Narrative  . Not on file   Social Determinants of Health   Financial Resource Strain:   . Difficulty of Paying Living Expenses: Not on file  Food Insecurity:   . Worried About Charity fundraiser in the Last Year: Not on file  . Ran Out of Food in the Last Year: Not on file  Transportation Needs:   . Lack of Transportation (Medical): Not on file  . Lack of Transportation (Non-Medical): Not on file  Physical Activity:   . Days of Exercise  per Week: Not on file  . Minutes of Exercise per Session: Not on file  Stress:   . Feeling of Stress : Not on file  Social Connections:   . Frequency of Communication with Friends and Family: Not on file  . Frequency of Social Gatherings with Friends and Family: Not on file  . Attends Religious Services: Not on file  . Active Member of Clubs or Organizations: Not on file  . Attends Archivist Meetings: Not on file  . Marital Status: Not on file  Intimate Partner Violence:   . Fear of Current or Ex-Partner: Not on file  . Emotionally Abused: Not on file  . Physically Abused: Not on file  . Sexually Abused: Not on file    No family history on file.  No current facility-administered medications for this visit. No current outpatient medications on file.  Facility-Administered Medications Ordered in Other Visits:  .  0.9 %  sodium chloride infusion, , Intravenous, PRN, Enzo Bi, MD .  amLODipine (NORVASC) tablet 5 mg, 5 mg, Oral, Daily, Jennye Boroughs, MD, 5 mg at 10/06/19 0930 .  apixaban (ELIQUIS) tablet 2.5 mg, 2.5 mg, Oral, BID, Jennye Boroughs, MD, 2.5 mg at 10/06/19 0929 .  atorvastatin (LIPITOR) tablet 80 mg,  80 mg, Oral, Daily, Jennye Boroughs, MD, 80 mg at 10/06/19 0931 .  azithromycin (ZITHROMAX) 500 mg in sodium chloride 0.9 % 250 mL IVPB, 500 mg, Intravenous, Q24H, Jennye Boroughs, MD .  cloNIDine (CATAPRES) tablet 0.1 mg, 0.1 mg, Oral, BID, Jennye Boroughs, MD, 0.1 mg at 10/06/19 0929 .  furosemide (LASIX) injection 40 mg, 40 mg, Intravenous, Daily, Enzo Bi, MD, 40 mg at 10/06/19 0930 .  insulin aspart (novoLOG) injection 0-5 Units, 0-5 Units, Subcutaneous, QHS, Lang Snow, NP, 3 Units at 10/05/19 2203 .  insulin aspart (novoLOG) injection 0-9 Units, 0-9 Units, Subcutaneous, TID WC, Lang Snow, NP, 2 Units at 10/06/19 [phone] .  insulin detemir (LEVEMIR) injection 20 Units, 20 Units, Subcutaneous, BID AC, Jennye Boroughs, MD, 20 Units at  10/06/19 0931 .  ipratropium-albuterol (DUONEB) 0.5-2.5 (3) MG/3ML nebulizer solution 3 mL, 3 mL, Nebulization, Q6H, Jennye Boroughs, MD, 3 mL at 10/06/19 0809 .  pantoprazole (PROTONIX) EC tablet 40 mg, 40 mg, Oral, QHS, Jennye Boroughs, MD, 40 mg at 10/05/19 2019 .  predniSONE (DELTASONE) tablet 40 mg, 40 mg, Oral, Q breakfast, Enzo Bi, MD, 40 mg at 10/06/19 0930 .  senna-docusate (Senokot-S) tablet 2 tablet, 2 tablet, Oral, BID, Jennye Boroughs, MD, 2 tablet at 10/06/19 3557  Physical exam:  Vitals:   10/03/19 1011  BP: (!) 154/50  Pulse: 89  Temp: (!) 97.1 F (36.2 C)  TempSrc: Tympanic  SpO2: 96%  Weight: 194 lb 12.8 oz (88.4 kg)  Height: 5\' 4"  (1.626 m)   Physical Exam Constitutional:      General: She is not in acute distress. HENT:     Head: Normocephalic and atraumatic.  Eyes:     Pupils: Pupils are equal, round, and reactive to light.  Cardiovascular:     Rate and Rhythm: Normal rate and regular rhythm.     Heart sounds: Normal heart sounds.  Pulmonary:     Effort: Pulmonary effort is normal.     Breath sounds: Normal breath sounds.  Abdominal:     General: Bowel sounds are normal.     Palpations: Abdomen is soft.  Musculoskeletal:     Cervical back: Normal range of motion.     Comments: B/l+1 edema  Skin:    General: Skin is warm and dry.  Neurological:     Mental Status: She is alert and oriented to person, place, and time.      CMP Latest Ref Rng & Units 10/05/2019  Glucose 70 - 99 mg/dL 212(H)  BUN 8 - 23 mg/dL 40(H)  Creatinine 0.44 - 1.00 mg/dL 1.46(H)  Sodium 135 - 145 mmol/L 141  Potassium 3.5 - 5.1 mmol/L 5.2(H)  Chloride 98 - 111 mmol/L 112(H)  CO2 22 - 32 mmol/L 19(L)  Calcium 8.9 - 10.3 mg/dL 8.9  Total Protein 6.5 - 8.1 g/dL -  Total Bilirubin 0.3 - 1.2 mg/dL -  Alkaline Phos 38 - 126 U/L -  AST 15 - 41 U/L -  ALT 14 - 54 U/L -   CBC Latest Ref Rng & Units 10/05/2019  WBC 4.0 - 10.5 K/uL 9.9  Hemoglobin 12.0 - 15.0 g/dL 9.7(L)    Hematocrit 36.0 - 46.0 % 33.1(L)  Platelets 150 - 400 K/uL 265    No images are attached to the encounter.  DG Chest Port 1 View  Result Date: 10/06/2019 CLINICAL DATA:  Pneumonia EXAM: PORTABLE CHEST 1 VIEW COMPARISON:  10/05/2019 FINDINGS: Mild cardiomegaly, unchanged. Multifocal bilateral airspace opacities, also unchanged. No  pneumothorax or sizable pleural effusion. Chronic interstitial opacities. IMPRESSION: Unchanged appearance of the chest with multifocal bilateral airspace opacities. Electronically Signed   By: Ulyses Jarred M.D.   On: 10/06/2019 03:25   DG Chest Port 1 View  Result Date: 10/05/2019 CLINICAL DATA:  Shortness of breath, worsening. EXAM: PORTABLE CHEST 1 VIEW COMPARISON:  Chest x-ray dated 12/21/2016. FINDINGS: Stable cardiomegaly. Patchy bilateral airspace opacities, LEFT greater than RIGHT, suspected pneumonia superimposed on chronic interstitial lung disease. Probable small LEFT pleural effusion. Osseous structures about the chest are unremarkable. IMPRESSION: 1. Suspected multifocal/bilateral pneumonia superimposed on chronic interstitial lung disease. 2. Probable small LEFT pleural effusion. 3. Stable cardiomegaly. Electronically Signed   By: Franki Cabot M.D.   On: 10/05/2019 13:36     Assessment and plan- Patient is a 85 y.o. female with iron and b12 deficiency anemia here for routine f/u  Her current hb is 9.4 which somewhat lower than prior values 3 months ago. Iron studies are normal. Ferritin is low at 28. I have asked her to take oral iron at this time. I will repeat cbc ferritin and iron studies in 3 and 6 months and I will see her in 6 months   Visit Diagnosis 1. Iron deficiency anemia, unspecified iron deficiency anemia type   2. B12 deficiency      Dr. Randa Evens, MD, MPH [facility] at [facility] [phone] 10/06/2019 11:56 AM

## 2019-10-06 NOTE — Progress Notes (Signed)
PHARMACY - PHYSICIAN COMMUNICATION CRITICAL VALUE ALERT - BLOOD CULTURE IDENTIFICATION (BCID)  Dorothy [name] is an 85 y.o. female who presented to [facility] on 10/05/2019  Assessment: 1/4 bottles (aerobic) GPC, likely contaminant  Name of physician (or Provider) Contacted: Dr. Billie Ruddy  Current antibiotics: azithromycin  Changes to prescribed antibiotics recommended: none   Tawnya Crook, PharmD 10/06/2019  2:29 PM

## 2019-10-06 NOTE — Progress Notes (Signed)
PROGRESS NOTE    [name]  ZYS:[id number] DOB: 04-24-1935 DOA: 10/05/2019 PCP: Letta Median, MD    Assessment & Plan:   Principal Problem:   Multifocal pneumonia Active Problems:   Acute hypoxemic respiratory failure (Dorothy [name])    AARA JACQUOT is an 85 y.o. female with medical history significant for chronic diastolic CHF, COPD, diabetes mellitus, CAD, hypertension, hyperlipidemia, obstructive sleep apnea, arthritis, anemia, presented to the hospital because of shortness of breath.  The patient was tachypneic, hypertensive and hypoxemic in the emergency room.  Oxygen saturation was 78% on room air.  Procalcitonin and lactic acid levels were normal.  Covid test was negative.  BNP was elevated at 248.  She was placed on 4 L oxygen via nasal cannula.  He was also given IV Solu-Medrol, IV azithromycin and IV Rocephin in the ER.  # Acute hypoxemic respiratory failure 2/2 # COPD exacerbation --Pt presented with new 4L O2 requirement.  CXR showed "Unchanged appearance of the chest with multifocal bilateral airspace opacities" and procal neg, so PNA less likely.   PLAN: --continue suppl O2 with goal >=90%, and wean as able --d/c ceftriaxone --continue azithromycin for severe COPD exacerbation --continue steroid as prednisone 40 mg daily --DuoNeb q6h scheduled  # Chronic diastolic CHF --does not look to be fluid overloaded --IV lasix 40 mg daily   Mild hyperkalemia:  Discontinue potassium supplement.  Expected to improve with IV Lasix.  Elevated BUN/creatinine: Probably due to underlying CKD stage IIIb.  Monitor renal function.  Insulin-dependent diabetes mellitus: Continue Novolin  Hypertension: Hold lisinopril because of hyperkalemia.  Continue amlodipine and clonidine  Atrial fibrillation: Continue Eliquis   DVT prophylaxis: XN:ATFTDDU Code Status: Full code  Family Communication: not today Disposition Plan: discharge when weaned back to RA.  PT to  determine discharge needs.   Subjective and Interval History:  Pt reported breathing better, no cough, no fever.  No chest pain, abdominal pain, N/V/D, dysuria, increased swelling.    Objective: Vitals:   10/06/19 1400 10/06/19 1603 10/06/19 2000 10/06/19 2020  BP:  136/61  (!) 144/71  Pulse:  73  80  Resp:  16  20  Temp:  98.4 F (36.9 C)  98.4 F (36.9 C)  TempSrc:  Oral  Oral  SpO2: 99% 94% 94% 97%  Weight:      Height:        Intake/Output Summary (Last 24 hours) at 10/06/2019 2146 Last data filed at 10/06/2019 2100 Gross per 24 hour  Intake 360 ml  Output 2100 ml  Net -1740 ml   Filed Weights   10/05/19 1252 10/06/19 0430  Weight: 88.5 kg 87.4 kg    Examination:   Constitutional: NAD, AAOx3 HEENT: conjunctivae and lids normal, EOMI CV: RRR no M,R,G. Distal pulses +2.  No cyanosis.   RESP: crackles over posterior mid-low lung fields, no wheezes, normal respiratory effort, on 4L but with only 1 prong in. GI: +BS, NTND Extremities: No effusions, edema, or tenderness in BLE SKIN: warm, dry and intact Neuro: II - XII grossly intact.  Sensation intact Psych: Normal mood and affect.  Appropriate judgement and reason   Data Reviewed: I have personally reviewed following labs and imaging studies  CBC: Recent Labs  Lab 10/03/19 0956 10/05/19 1304  WBC 9.0 9.9  NEUTROABS 7.4 8.7*  HGB 9.4* 9.7*  HCT 32.0* 33.1*  MCV 93.3 90.9  PLT 292 202   Basic Metabolic Panel: Recent Labs  Lab 10/05/19 1304  NA 141  K 5.2*  CL 112*  CO2 19*  GLUCOSE 212*  BUN 40*  CREATININE 1.46*  CALCIUM 8.9   GFR: Estimated Creatinine Clearance: 29.8 mL/min (A) (by C-G formula based on SCr of 1.46 mg/dL (H)). Liver Function Tests: No results for input(s): AST, ALT, ALKPHOS, BILITOT, PROT, ALBUMIN in the last 168 hours. No results for input(s): LIPASE, AMYLASE in the last 168 hours. No results for input(s): AMMONIA in the last 168 hours. Coagulation Profile: No results  for input(s): INR, PROTIME in the last 168 hours. Cardiac Enzymes: No results for input(s): CKTOTAL, CKMB, CKMBINDEX, TROPONINI in the last 168 hours. BNP (last 3 results) No results for input(s): PROBNP in the last 8760 hours. HbA1C: No results for input(s): HGBA1C in the last 72 hours. CBG: Recent Labs  Lab 10/05/19 2048 10/06/19 0815 10/06/19 1154 10/06/19 1635 10/06/19 2113  GLUCAP 258* 183* 212* 293* 379*   Lipid Profile: No results for input(s): CHOL, HDL, LDLCALC, TRIG, CHOLHDL, LDLDIRECT in the last 72 hours. Thyroid Function Tests: No results for input(s): TSH, T4TOTAL, FREET4, T3FREE, THYROIDAB in the last 72 hours. Anemia Panel: Recent Labs    10/05/19 1836  FERRITIN 48   Sepsis Labs: Recent Labs  Lab 10/05/19 1332  PROCALCITON <0.10  LATICACIDVEN 1.2    Recent Results (from the past 240 hour(s))  Blood culture (routine x 2)     Status: None (Preliminary result)   Collection Time: 10/05/19  1:39 PM   Specimen: BLOOD  Result Value Ref Range Status   Specimen Description BLOOD LAC  Final   Special Requests   Final    BOTTLES DRAWN AEROBIC AND ANAEROBIC Blood Culture adequate volume   Culture  Setup Time   Final    AEROBIC BOTTLE ONLY GRAM POSITIVE COCCI CRITICAL RESULT CALLED TO, READ BACK BY AND VERIFIED WITH: LISA KLUTZ AT 8502 ON 10/06/19 [place]. Performed at [facility], [address] [place], [place] [address]    Culture [facility] POSITIVE COCCI  Final   Report Status PENDING  Incomplete  Blood culture (routine x 2)     Status: None (Preliminary result)   Collection Time: 10/05/19  2:37 PM   Specimen: BLOOD  Result Value Ref Range Status   Specimen Description BLOOD LEFT ANTECUBITAL  Final   Special Requests   Final    BOTTLES DRAWN AEROBIC AND ANAEROBIC Blood Culture adequate volume   Culture   Final    NO GROWTH < 24 HOURS Performed at [facility], [address], [place],  [address]    Report Status PENDING   Incomplete  Respiratory Panel by RT PCR (Flu A&B, Covid) - Nasopharyngeal Swab     Status: None   Collection Time: 10/05/19  2:37 PM   Specimen: Nasopharyngeal Swab  Result Value Ref Range Status   SARS Coronavirus 2 by RT PCR NEGATIVE NEGATIVE Final    Comment: (NOTE) SARS-CoV-2 target nucleic acids are NOT DETECTED. The SARS-CoV-2 RNA is generally detectable in upper respiratoy specimens during the acute phase of infection. The lowest concentration of SARS-CoV-2 viral copies this assay can detect is 131 copies/mL. A negative result does not preclude SARS-Cov-2 infection and should not be used as the sole basis for treatment or other patient management decisions. A negative result may occur with  improper specimen collection/handling, submission of specimen other than nasopharyngeal swab, presence of viral mutation(s) within the areas targeted by this assay, and inadequate number of viral copies (<131 copies/mL). A negative result must be  combined with clinical observations, patient history, and epidemiological information. The expected result is Negative. Fact Sheet for Patients:  PinkCheek.be Fact Sheet for Healthcare Providers:  GravelBags.it This test is not yet ap proved or cleared by the [place] FDA and  has been authorized for detection and/or diagnosis of SARS-CoV-2 by FDA under an Emergency Use Authorization (EUA). This EUA will remain  in effect (meaning this test can be used) for the duration of the COVID-19 declaration under Section 564(b)(1) of the Act, 21 U.S.C. section 360bbb-3(b)(1), unless the authorization is terminated or revoked sooner.    Influenza A by PCR NEGATIVE NEGATIVE Final   Influenza B by PCR NEGATIVE NEGATIVE Final    Comment: (NOTE) The Xpert Xpress SARS-CoV-2/FLU/RSV assay is intended as an aid in  the diagnosis of influenza from Nasopharyngeal swab specimens and  should not be used  as a sole basis for treatment. Nasal washings and  aspirates are unacceptable for Xpert Xpress SARS-CoV-2/FLU/RSV  testing. Fact Sheet for Patients: PinkCheek.be Fact Sheet for Healthcare Providers: GravelBags.it This test is not yet approved or cleared by the [place] FDA and  has been authorized for detection and/or diagnosis of SARS-CoV-2 by  FDA under an Emergency Use Authorization (EUA). This EUA will remain  in effect (meaning this test can be used) for the duration of the  Covid-19 declaration under Section 564(b)(1) of the Act, 21  U.S.C. section 360bbb-3(b)(1), unless the authorization is  terminated or revoked. Performed at [facility], [address] [place], [place] [address]       Radiology Studies: DG Chest [place] 1 View  Result Date: 10/06/2019 CLINICAL DATA:  Pneumonia EXAM: PORTABLE CHEST 1 VIEW COMPARISON:  10/05/2019 FINDINGS: Mild cardiomegaly, unchanged. Multifocal bilateral airspace opacities, also unchanged. No pneumothorax or sizable pleural effusion. Chronic interstitial opacities. IMPRESSION: Unchanged appearance of the chest with multifocal bilateral airspace opacities. Electronically Signed   By: Ulyses Jarred M.D.   On: 10/06/2019 03:25   DG Chest Port 1 View  Result Date: 10/05/2019 CLINICAL DATA:  Shortness of breath, worsening. EXAM: PORTABLE CHEST 1 VIEW COMPARISON:  Chest x-ray dated 12/21/2016. FINDINGS: Stable cardiomegaly. Patchy bilateral airspace opacities, LEFT greater than RIGHT, suspected pneumonia superimposed on chronic interstitial lung disease. Probable small LEFT pleural effusion. Osseous structures about the chest are unremarkable. IMPRESSION: 1. Suspected multifocal/bilateral pneumonia superimposed on chronic interstitial lung disease. 2. Probable small LEFT pleural effusion. 3. Stable cardiomegaly. Electronically Signed   By: Franki Cabot M.D.   On: 10/05/2019 13:36      Scheduled Meds: . amLODipine  5 mg Oral Daily  . apixaban  2.5 mg Oral BID  . atorvastatin  80 mg Oral Daily  . cloNIDine  0.1 mg Oral BID  . furosemide  40 mg Intravenous Daily  . insulin aspart  0-5 Units Subcutaneous QHS  . insulin aspart  0-9 Units Subcutaneous TID WC  . insulin detemir  20 Units Subcutaneous BID AC  . ipratropium-albuterol  3 mL Nebulization Q6H  . pantoprazole  40 mg Oral QHS  . predniSONE  40 mg Oral Q breakfast  . senna-docusate  2 tablet Oral BID   Continuous Infusions: . sodium chloride 250 mL (10/06/19 1426)  . azithromycin 500 mg (10/06/19 1427)     LOS: 1 day     Enzo Bi, MD [facility] If 7PM-7AM, please contact night-coverage 10/06/2019, 9:46 PM

## 2019-10-06 NOTE — Progress Notes (Addendum)
Gave update to granddaughter [name] and and son in law Genoveva Ill over the phone.   Update 1925: Marveen Reeks to update before leaving since this was the agreement we had when we spoke in the morning. No answer. Brief voicemail left that he would get another update call tomorrow.

## 2019-10-07 LAB — GLUCOSE, CAPILLARY
Glucose-Capillary: 132 mg/dL — ABNORMAL HIGH (ref 70–99)
Glucose-Capillary: 192 mg/dL — ABNORMAL HIGH (ref 70–99)
Glucose-Capillary: 282 mg/dL — ABNORMAL HIGH (ref 70–99)
Glucose-Capillary: 383 mg/dL — ABNORMAL HIGH (ref 70–99)

## 2019-10-07 LAB — CBC
HCT: 29.1 % — ABNORMAL LOW (ref 36.0–46.0)
Hemoglobin: 8.6 g/dL — ABNORMAL LOW (ref 12.0–15.0)
MCH: 27 pg (ref 26.0–34.0)
MCHC: 29.6 g/dL — ABNORMAL LOW (ref 30.0–36.0)
MCV: 91.2 fL (ref 80.0–100.0)
Platelets: 238 10*3/uL (ref 150–400)
RBC: 3.19 MIL/uL — ABNORMAL LOW (ref 3.87–5.11)
RDW: 15.3 % (ref 11.5–15.5)
WBC: 7.6 10*3/uL (ref 4.0–10.5)
nRBC: 0 % (ref 0.0–0.2)

## 2019-10-07 LAB — BASIC METABOLIC PANEL
Anion gap: 5 (ref 5–15)
BUN: 44 mg/dL — ABNORMAL HIGH (ref 8–23)
CO2: 25 mmol/L (ref 22–32)
Calcium: 8.6 mg/dL — ABNORMAL LOW (ref 8.9–10.3)
Chloride: 112 mmol/L — ABNORMAL HIGH (ref 98–111)
Creatinine, Ser: 1.45 mg/dL — ABNORMAL HIGH (ref 0.44–1.00)
GFR calc Af Amer: 39 mL/min — ABNORMAL LOW (ref >60)
GFR calc non Af Amer: 33 mL/min — ABNORMAL LOW (ref >60)
Glucose, Bld: 169 mg/dL — ABNORMAL HIGH (ref 70–99)
Potassium: 5.2 mmol/L — ABNORMAL HIGH (ref 3.5–5.1)
Sodium: 142 mmol/L (ref 135–145)

## 2019-10-07 LAB — HEMOGLOBIN A1C
Hgb A1c MFr Bld: 6.4 % — ABNORMAL HIGH (ref 4.8–5.6)
Mean Plasma Glucose: 136.98 mg/dL

## 2019-10-07 LAB — MAGNESIUM: Magnesium: 2.1 mg/dL (ref 1.7–2.4)

## 2019-10-07 MED ORDER — INSULIN ASPART 100 UNIT/ML ~~LOC~~ SOLN
3.0000 [IU] | Freq: Three times a day (TID) | SUBCUTANEOUS | Status: DC
  Administered 2019-10-07 – 2019-10-11 (×12): 3 [IU] via SUBCUTANEOUS
  Filled 2019-10-07 (×13): qty 1

## 2019-10-07 MED ORDER — SODIUM CHLORIDE 0.9% FLUSH
3.0000 mL | Freq: Two times a day (BID) | INTRAVENOUS | Status: DC
  Administered 2019-10-07 – 2019-10-11 (×8): 3 mL via INTRAVENOUS

## 2019-10-07 MED ORDER — LISINOPRIL 10 MG PO TABS
10.0000 mg | ORAL_TABLET | Freq: Every day | ORAL | Status: DC
  Administered 2019-10-07 – 2019-10-11 (×5): 10 mg via ORAL
  Filled 2019-10-07 (×5): qty 1

## 2019-10-07 NOTE — Progress Notes (Signed)
PROGRESS NOTE    Dorothy [name]  CHY:[id number] DOB: 27-Jun-1935 DOA: 10/05/2019 PCP: Letta Median, MD    Assessment & Plan:   Principal Problem:   Multifocal pneumonia Active Problems:   Acute hypoxemic respiratory failure (HCC)    Dorothy [name] is an 85 y.o. Caucasian female with medical history significant for chronic diastolic CHF, COPD, diabetes mellitus, CAD, hypertension, hyperlipidemia, obstructive sleep apnea, arthritis, anemia, presented to the hospital because of shortness of breath.  The patient was tachypneic, hypertensive and hypoxemic in the emergency room.  Oxygen saturation was 78% on room air.  Procalcitonin and lactic acid levels were normal.  Covid test was negative.  BNP was elevated at 248.  She was placed on 4 L oxygen via nasal cannula.  He was also given IV Solu-Medrol, IV azithromycin and IV Rocephin in the ER.  # Acute hypoxemic respiratory failure  --Likely due to COPD exacerbation and possible diastolic CHF exacerbation. --Pt presented with new 4L O2 requirement.  CXR showed "Unchanged appearance of the chest with multifocal bilateral airspace opacities" and procal neg, so PNA less likely.   Ceftriaxone d/c'ed. --Treat both as below  # COPD exacerbation  --continue suppl O2 with goal >=90%, and wean as able --continue azithromycin for severe COPD exacerbation --continue steroid as prednisone 40 mg daily --DuoNeb q6h scheduled  # Possible diastolic CHF exacerbation --BNP 248, bilateral airspace opacities and crackles present on presentation.  Received IV lasix 40 mg with good response (Cr stable, good urine output, reduced O2 requirement). PLAN: --continue IV lasix 40 mg daily, until Cr bumps up  Mild hyperkalemia:  Discontinue potassium supplement.  Expected to improve with IV Lasix.  Elevated BUN/creatinine:  Probably due to underlying CKD stage IIIb.  Monitor renal function.  Insulin-dependent diabetes mellitus:  Continue Levemir  20u BID Add 3u meal-time TID SSI TID, no night-time coverage.  Hypertension:  Continue amlodipine and clonidine Resume lisinopril today  Atrial fibrillation:  No rate-control agent on home med list.  HR controlled. Continue Eliquis   DVT prophylaxis: IN:OMVEHMC Code Status: Full code  Family Communication: not today Disposition Plan: discharge when weaned back to RA, or can discharge on 1-2L suppl O2 for short-term since pt does have qualifying dx for home O2.  [facility] with [facility].  Pt lives alone.   Subjective and Interval History:  Pt reported breathing continued to get better, had some sputum production.  No more wheezing.  O2 requirement decreased down to 2L at rest.  No fever, chest pain, abdominal pain, N/V/D, dysuria, increased swelling.  Pt also got up and walked in the room.   Objective: Vitals:   10/07/19 0729 10/07/19 0751 10/07/19 1022 10/07/19 1333  BP: (!) 149/80     Pulse: 69     Resp: 19     Temp: (!) 97.5 F (36.4 C)     TempSrc: Oral     SpO2: 95% 97% (!) 84% 95%  Weight:      Height:        Intake/Output Summary (Last 24 hours) at 10/07/2019 1429 Last data filed at 10/07/2019 1300 Gross per 24 hour  Intake 480 ml  Output 500 ml  Net -20 ml   Filed Weights   10/05/19 1252 10/06/19 0430 10/07/19 0344  Weight: 88.5 kg 87.4 kg 88 kg    Examination:   Constitutional: NAD, AAOx3 HEENT: conjunctivae and lids normal, EOMI CV: RRR no M,R,G. Distal pulses +2.  No cyanosis.   RESP: lungs  sounds more clear, no wheezes, normal respiratory effort, on 2L  GI: +BS, NTND Extremities: No effusions, edema, or tenderness in BLE SKIN: warm, dry and intact Neuro: II - XII grossly intact.  Sensation intact Psych: Normal mood and affect.  Appropriate judgement and reason   Data Reviewed: I have personally reviewed following labs and imaging studies  CBC: Recent Labs  Lab 10/03/19 0956 10/05/19 1304 10/07/19 0629  WBC 9.0 9.9 7.6  NEUTROABS 7.4 8.7*   --   HGB 9.4* 9.7* 8.6*  HCT 32.0* 33.1* 29.1*  MCV 93.3 90.9 91.2  PLT 292 265 884   Basic Metabolic Panel: Recent Labs  Lab 10/05/19 1304 10/07/19 0629  NA 141 142  K 5.2* 5.2*  CL 112* 112*  CO2 19* 25  GLUCOSE 212* 169*  BUN 40* 44*  CREATININE 1.46* 1.45*  CALCIUM 8.9 8.6*  MG  --  2.1   GFR: Estimated Creatinine Clearance: 30.2 mL/min (A) (by C-G formula based on SCr of 1.45 mg/dL (H)). Liver Function Tests: No results for input(s): AST, ALT, ALKPHOS, BILITOT, PROT, ALBUMIN in the last 168 hours. No results for input(s): LIPASE, AMYLASE in the last 168 hours. No results for input(s): AMMONIA in the last 168 hours. Coagulation Profile: No results for input(s): INR, PROTIME in the last 168 hours. Cardiac Enzymes: No results for input(s): CKTOTAL, CKMB, CKMBINDEX, TROPONINI in the last 168 hours. BNP (last 3 results) No results for input(s): PROBNP in the last 8760 hours. HbA1C: No results for input(s): HGBA1C in the last 72 hours. CBG: Recent Labs  Lab 10/06/19 1154 10/06/19 1635 10/06/19 2113 10/07/19 0730 10/07/19 1216  GLUCAP 212* 293* 379* 132* 192*   Lipid Profile: No results for input(s): CHOL, HDL, LDLCALC, TRIG, CHOLHDL, LDLDIRECT in the last 72 hours. Thyroid Function Tests: No results for input(s): TSH, T4TOTAL, FREET4, T3FREE, THYROIDAB in the last 72 hours. Anemia Panel: Recent Labs    10/05/19 1836  FERRITIN 48   Sepsis Labs: Recent Labs  Lab 10/05/19 1332  PROCALCITON <0.10  LATICACIDVEN 1.2    Recent Results (from the past 240 hour(s))  Blood culture (routine x 2)     Status: None (Preliminary result)   Collection Time: 10/05/19  1:39 PM   Specimen: BLOOD  Result Value Ref Range Status   Specimen Description   Final    BLOOD LAC Performed at [facility], [address] [place], [place] [address]    Special Requests   Final    BOTTLES DRAWN AEROBIC AND ANAEROBIC Blood Culture adequate volume Performed at [facility], [address] [place], [place] [address]    Culture  Setup Time   Final    AEROBIC BOTTLE ONLY GRAM POSITIVE COCCI CRITICAL RESULT CALLED TO, READ BACK BY AND VERIFIED WITH: LISA KLUTZ AT 1406 ON 10/06/19 [address]. Performed at [facility], [address], [place], [place] [address]    Culture   Final    Lonell Grandchild POSITIVE COCCI IDENTIFICATION TO FOLLOW Performed at [facility], [place] [address], [place], [place] [address]    Report Status PENDING  Incomplete  Blood culture (routine x 2)     Status: None (Preliminary result)   Collection Time: 10/05/19  2:37 PM   Specimen: BLOOD  Result Value Ref Range Status   Specimen Description BLOOD LEFT ANTECUBITAL  Final   Special Requests   Final    BOTTLES DRAWN AEROBIC AND ANAEROBIC Blood Culture adequate volume   Culture  Final    NO GROWTH 2 DAYS Performed at [facility], [address] [place], [place] [address]    Report Status PENDING  Incomplete  Respiratory Panel by RT PCR (Flu A&B, Covid) - Nasopharyngeal Swab     Status: None   Collection Time: 10/05/19  2:37 PM   Specimen: Nasopharyngeal Swab  Result Value Ref Range Status   SARS Coronavirus 2 by RT PCR NEGATIVE NEGATIVE Final    Comment: (NOTE) SARS-CoV-2 target nucleic acids are NOT DETECTED. The SARS-CoV-2 RNA is generally detectable in upper respiratoy specimens during the acute phase of infection. The lowest concentration of SARS-CoV-2 viral copies this assay can detect is 131 copies/mL. A negative result does not preclude SARS-Cov-2 infection and should not be used as the sole basis for treatment or other patient management decisions. A negative result may occur with  improper specimen collection/handling, submission of specimen other than nasopharyngeal swab, presence of viral mutation(s) within the areas targeted by this assay, and inadequate number of viral copies (<131 copies/mL). A negative result must be combined with  clinical observations, patient history, and epidemiological information. The expected result is Negative. Fact Sheet for Patients:  PinkCheek.be Fact Sheet for Healthcare Providers:  GravelBags.it This test is not yet ap proved or cleared by the [place] FDA and  has been authorized for detection and/or diagnosis of SARS-CoV-2 by FDA under an Emergency Use Authorization (EUA). This EUA will remain  in effect (meaning this test can be used) for the duration of the COVID-19 declaration under Section 564(b)(1) of the Act, 21 U.S.C. section 360bbb-3(b)(1), unless the authorization is terminated or revoked sooner.    Influenza A by PCR NEGATIVE NEGATIVE Final   Influenza B by PCR NEGATIVE NEGATIVE Final    Comment: (NOTE) The Xpert Xpress SARS-CoV-2/FLU/RSV assay is intended as an aid in  the diagnosis of influenza from Nasopharyngeal swab specimens and  should not be used as a sole basis for treatment. Nasal washings and  aspirates are unacceptable for Xpert Xpress SARS-CoV-2/FLU/RSV  testing. Fact Sheet for Patients: PinkCheek.be Fact Sheet for Healthcare Providers: GravelBags.it This test is not yet approved or cleared by the [place] FDA and  has been authorized for detection and/or diagnosis of SARS-CoV-2 by  FDA under an Emergency Use Authorization (EUA). This EUA will remain  in effect (meaning this test can be used) for the duration of the  Covid-19 declaration under Section 564(b)(1) of the Act, 21  U.S.C. section 360bbb-3(b)(1), unless the authorization is  terminated or revoked. Performed at [facility], [address] [place], [place] [address]       Radiology Studies: DG Chest [place] 1 View  Result Date: 10/06/2019 CLINICAL DATA:  Pneumonia EXAM: PORTABLE CHEST 1 VIEW COMPARISON:  10/05/2019 FINDINGS: Mild cardiomegaly, unchanged.  Multifocal bilateral airspace opacities, also unchanged. No pneumothorax or sizable pleural effusion. Chronic interstitial opacities. IMPRESSION: Unchanged appearance of the chest with multifocal bilateral airspace opacities. Electronically Signed   By: Ulyses Jarred M.D.   On: 10/06/2019 03:25     Scheduled Meds: . amLODipine  5 mg Oral Daily  . apixaban  2.5 mg Oral BID  . atorvastatin  80 mg Oral Daily  . cloNIDine  0.1 mg Oral BID  . furosemide  40 mg Intravenous Daily  . insulin aspart  0-9 Units Subcutaneous TID WC  . insulin aspart  3 Units Subcutaneous TID WC  . insulin detemir  20 Units Subcutaneous BID AC  . ipratropium-albuterol  3 mL Nebulization Q6H  . pantoprazole  40 mg Oral QHS  . predniSONE  40 mg Oral Q breakfast  . senna-docusate  2 tablet Oral BID   Continuous Infusions: . sodium chloride Stopped (10/06/19 1607)  . azithromycin 500 mg (10/07/19 1302)     LOS: 2 days     Enzo Bi, MD [facility] If 7PM-7AM, please contact night-coverage 10/07/2019, 2:29 PM

## 2019-10-07 NOTE — Plan of Care (Signed)
  Problem: Activity: Goal: Ability to tolerate increased activity will improve Outcome: Not Progressing Note: Patient's oxygen saturation levels dropped earlier when working with the physical therapist, originally on 3 L O2, taken off oxygen, then put back on 2 L O2 via [place] acutely. Informed social work and case management. Patient remains in the room chair now. Will continue to monitor respiratory status for the remainder of the shift. Wenda Low [facility]

## 2019-10-07 NOTE — Progress Notes (Signed)
Inpatient Diabetes Program Recommendations  AACE/ADA: New Consensus Statement on Inpatient Glycemic Control (2015)  Target Ranges:  Prepandial:   less than 140 mg/dL      Peak postprandial:   less than 180 mg/dL (1-2 hours)      Critically ill patients:  140 - 180 mg/dL   Lab Results  Component Value Date   GLUCAP 192 (H) 10/07/2019   HGBA1C 6.7 (H) 01/10/2015    Review of Glycemic Control Results for Dorothy [name], Dorothy [name] (MRN [id number]) as of 10/07/2019 12:21  Ref. Range 10/06/2019 08:15 10/06/2019 11:54 10/06/2019 16:35 10/06/2019 21:13 10/07/2019 07:30 10/07/2019 12:16  Glucose-Capillary Latest Ref Range: 70 - 99 mg/dL 183 (H) 212 (H) 293 (H) 379 (H) 132 (H) 192 (H)   Diabetes history: DM2 Outpatient Diabetes medications: Novolin 20 units am + 15 units pm Current orders for Inpatient glycemic control: Levemir 20 units bid + Novolog sensitive correction tid   Inpatient Diabetes Program Recommendations:   Please consider: -A1c to determine prehospital glycemic control -Add Novolog 3 units tid meal coverage if eats 50%  Thank you, Bethena Roys E. Mikeala Girdler, RN, MSN, CDE  Diabetes Coordinator Inpatient Glycemic Control Team Team Pager [phone] (8am-5pm) 10/07/2019 12:25 PM

## 2019-10-07 NOTE — Evaluation (Signed)
Physical Therapy Evaluation Patient Details Name: Dorothy [name] MRN: [id number] DOB: [birth year] Today's Date: 10/07/2019   History of Present Illness  Dorothy [name] is an 42yoF who comes [facility] on 1/[facility] c SOB. The patient was tachypneic, hypertensive and hypoxemic in the emergency room.  Oxygen saturation was 78% on room air.  Procalcitonin and lactic acid levels were normal.  Covid test was negative.  BNP was elevated at 248.  She was placed on 4 L oxygen via nasal cannula. PMH: chronic diastolic CHF, COPD, diabetes mellitus, CAD, hypertension, hyperlipidemia, obstructive sleep apnea, arthritis, anemia.  Clinical Impression  Pt admitted with above diagnosis. Pt currently with functional limitations due to the deficits listed below (see "PT Problem List"). Upon entry, pt in bed, awake and agreeable to participate. The pt is alert and oriented x4, pleasant, conversational, and generally a good historian. Heavy effort for bed mobility and transfers but no assist needed and both appear generally safe. Pt AMB 3x66ft in room, once with RW, twice with intermittent hand support. AMB limited mostly by desaturation, despite only minimal SOB. Functional mobility assessment demonstrates increased effort/time requirements, poor tolerance, and need for physical assistance, whereas the patient performed these at a higher level of independence PTA. Pt will benefit from skilled PT intervention to increase independence and safety with basic mobility in preparation for discharge to the venue listed below.    Session vitals include:  -95% on 3L/min in bed upon arrival; -84% on ROA resting on room air;  -AMB 17ft on room air down to 75% SpO2, 122bpm  -AMB 85ft on 2L 78%, 94bpm  -AMB 60ft on 4L/min 88% 107bpm     Follow Up Recommendations Home health PT;Supervision - Intermittent    Equipment Recommendations  None recommended by PT    Recommendations for Other Services       Precautions / Restrictions  Precautions Precautions: Fall Precaution Comments: O2 needed at this time Restrictions Weight Bearing Restrictions: No      Mobility  Bed Mobility Overal bed mobility: Needs Assistance Bed Mobility: Supine to Sit     Supine to sit: Supervision        Transfers Overall transfer level: Needs assistance Equipment used: Rolling walker (2 wheeled);None Transfers: Sit to/from Stand Sit to Stand: Supervision         General transfer comment: heavy effort required  Ambulation/Gait   Gait Distance (Feet): 50 Feet(3x75ft on RA, 2L, 4L) Assistive device: Rolling walker (2 wheeled);None Gait Pattern/deviations: WFL(Within Functional Limits)     General Gait Details: intermittent single UE support  Stairs            Wheelchair Mobility    Modified Rankin (Stroke Patients Only)       Balance Overall balance assessment: Modified Independent;Mild deficits observed, not formally tested                                           Pertinent Vitals/Pain Pain Assessment: No/denies pain    Home Living Family/patient expects to be discharged to:: Private residence Living Arrangements: Alone Available Help at Discharge: Family(G-DTR;) Type of Home: Mobile home Home Access: Ramped entrance       Home Equipment: [place] - 2 wheels;Walker - 4 wheels      Prior Function Level of Independence: Needs assistance   Gait / Transfers Assistance Needed: mostly household distances, 4ww for out of home;  ADL's / Homemaking Assistance Needed: independent with ADL; grand DTR brings groceries and assists with transportation        Hand Dominance        Extremity/Trunk Assessment   Upper Extremity Assessment Upper Extremity Assessment: Overall WFL for tasks assessed    Lower Extremity Assessment Lower Extremity Assessment: Overall WFL for tasks assessed    Cervical / Trunk Assessment Cervical / Trunk Assessment: Kyphotic  Communication    Communication: No difficulties  Cognition Arousal/Alertness: Awake/alert Behavior During Therapy: WFL for tasks assessed/performed Overall Cognitive Status: Within Functional Limits for tasks assessed                                        General Comments      Exercises     Assessment/Plan    PT Assessment Patient needs continued PT services  PT Problem List Decreased strength;Decreased activity tolerance;Decreased balance;Decreased mobility       PT Treatment Interventions DME instruction;Gait training;Functional mobility training;Therapeutic activities;Therapeutic exercise;Balance training;Patient/family education    PT Goals (Current goals can be found in the Care Plan section)  Acute Rehab PT Goals Patient Stated Goal: be rid of supplemental O2 PT Goal Formulation: With patient Time For Goal Achievement: 10/21/19 Potential to Achieve Goals: Fair    Frequency Min 2X/week   Barriers to discharge Inaccessible home environment      Co-evaluation               AM-PAC PT "6 Clicks" Mobility  Outcome Measure Help needed turning from your back to your side while in a flat bed without using bedrails?: None Help needed moving from lying on your back to sitting on the side of a flat bed without using bedrails?: None Help needed moving to and from a bed to a chair (including a wheelchair)?: A Little Help needed standing up from a chair using your arms (e.g., wheelchair or bedside chair)?: A Little Help needed to walk in hospital room?: A Little Help needed climbing 3-5 steps with a railing? : A Little 6 Click Score: 20    End of Session Equipment Utilized During Treatment: Oxygen Activity Tolerance: Patient tolerated treatment well;Treatment limited secondary to medical complications (Comment) Patient left: in chair;with chair alarm set;with call bell/phone within reach Nurse Communication: Mobility status PT Visit Diagnosis: Difficulty in walking,  not elsewhere classified (R26.2);Other abnormalities of gait and mobility (R26.89)    Time: 5573-2202 PT Time Calculation (min) (ACUTE ONLY): 25 min   Charges:   PT Evaluation $PT Eval Low Complexity: 1 Low PT Treatments $Therapeutic Exercise: 8-22 mins       11:33 AM, 10/07/19 Dorothy Grandchild, PT, DPT Physical Therapist - [facility]  [phone] ([place])   Malynn Lucy C 10/07/2019, 11:30 AM

## 2019-10-08 DIAGNOSIS — J189 Pneumonia, unspecified organism: Principal | ICD-10-CM

## 2019-10-08 LAB — CBC
HCT: 28.1 % — ABNORMAL LOW (ref 36.0–46.0)
Hemoglobin: 8.5 g/dL — ABNORMAL LOW (ref 12.0–15.0)
MCH: 27.3 pg (ref 26.0–34.0)
MCHC: 30.2 g/dL (ref 30.0–36.0)
MCV: 90.4 fL (ref 80.0–100.0)
Platelets: 245 10*3/uL (ref 150–400)
RBC: 3.11 MIL/uL — ABNORMAL LOW (ref 3.87–5.11)
RDW: 15.2 % (ref 11.5–15.5)
WBC: 7.3 10*3/uL (ref 4.0–10.5)
nRBC: 0 % (ref 0.0–0.2)

## 2019-10-08 LAB — CULTURE, BLOOD (ROUTINE X 2): Special Requests: ADEQUATE

## 2019-10-08 LAB — GLUCOSE, CAPILLARY
Glucose-Capillary: 129 mg/dL — ABNORMAL HIGH (ref 70–99)
Glucose-Capillary: 230 mg/dL — ABNORMAL HIGH (ref 70–99)
Glucose-Capillary: 319 mg/dL — ABNORMAL HIGH (ref 70–99)
Glucose-Capillary: 311 mg/dL — ABNORMAL HIGH (ref 70–99)

## 2019-10-08 LAB — BASIC METABOLIC PANEL
Anion gap: 6 (ref 5–15)
BUN: 43 mg/dL — ABNORMAL HIGH (ref 8–23)
CO2: 25 mmol/L (ref 22–32)
Calcium: 8.6 mg/dL — ABNORMAL LOW (ref 8.9–10.3)
Chloride: 111 mmol/L (ref 98–111)
Creatinine, Ser: 1.26 mg/dL — ABNORMAL HIGH (ref 0.44–1.00)
GFR calc Af Amer: 46 mL/min — ABNORMAL LOW (ref >60)
GFR calc non Af Amer: 40 mL/min — ABNORMAL LOW (ref >60)
Glucose, Bld: 182 mg/dL — ABNORMAL HIGH (ref 70–99)
Potassium: 4.5 mmol/L (ref 3.5–5.1)
Sodium: 142 mmol/L (ref 135–145)

## 2019-10-08 LAB — MAGNESIUM: Magnesium: 2.1 mg/dL (ref 1.7–2.4)

## 2019-10-08 NOTE — Progress Notes (Addendum)
PROGRESS NOTE    Dorothy [name]  QIO:[id number] DOB: [birth year] DOA: 10/05/2019 PCP: Letta Median, MD   Dorothy [name] is an 85 y.o. Caucasian female with medical history significant for chronic diastolic CHF, COPD, diabetes mellitus, CAD, hypertension, hyperlipidemia, obstructive sleep apnea, arthritis, anemia, presented to the hospital because of shortness of breath.  The patient was tachypneic, hypertensive and hypoxemic in the emergency room.  Oxygen saturation was 78% on room air.  Procalcitonin and lactic acid levels were normal.  Covid test was negative.  BNP was elevated at 248.  She was placed on 4 L oxygen via nasal cannula.  He was also given IV Solu-Medrol, IV azithromycin and IV Rocephin in the ER.   Assessment & Plan:   Principal Problem:   Multifocal pneumonia Active Problems:   Acute hypoxemic respiratory failure (HCC)   # Acute hypoxemic respiratory failure  --Suspected CHF exacerbation is more likely primary contributing factor --Pt presented with new 4L O2 requirement.   -CXR showed "Unchanged appearance of the chest with multifocal bilateral airspace opacities" and procal neg, so PNA less likely.   Ceftriaxone d/c'ed. --Treat both as below  # COPD exacerbation  --continue suppl O2 with goal 88-92%, and wean as able --continue azithromycin for severe COPD exacerbation --continue steroid as prednisone 40 mg daily --DuoNeb q6h scheduled  # Possible diastolic CHF exacerbation --BNP 248, bilateral airspace opacities and crackles present on presentation.  Received IV lasix 40 mg with good response (Cr stable, good urine output, reduced O2 requirement). PLAN: --continue IV lasix 40 mg daily, monitor creatinine and watch for contraction alkalosis - 2g sodium diet - 2L fluid restriction - Goal net negative 1-1.5L daily - Weigh daily - Strict I/O  Mild hyperkalemia:  Discontinue potassium supplement.  Expected to improve with IV Lasix.  Elevated  BUN/creatinine:  Probably due to underlying CKD stage IIIb.  Monitor renal function.  Insulin-dependent diabetes mellitus:  Continue Levemir 20u BID Add 3u meal-time TID SSI TID, no night-time coverage.  Hypertension:  Continue amlodipine and clonidine Continue lisinopril   Paroxysmal Atrial fibrillation:  No rate-control agent on home med list.  HR controlled. Continue Eliquis   DVT prophylaxis: LK:[id number] Code Status: Full code  Family Communication: not today Disposition Plan: discharge when weaned back to RA, or can discharge on 1-2L suppl O2 for short-term since pt does have qualifying dx for home O2.  [facility] rec [facility].  Pt lives alone.   Subjective and Interval History:  1/27: Remains on 2 L supplemental oxygen.  -500 cc over interval.  Continues to endorse shortness of breath    Objective: Vitals:   10/07/19 1936 10/08/19 0414 10/08/19 0727 10/08/19 0743  BP: (!) 154/67 (!) 166/65  (!) 162/67  Pulse: 78 80  72  Resp: 20 19  18   Temp: (!) 97.4 F (36.3 C) 97.8 F (36.6 C)  97.7 F (36.5 C)  TempSrc: Oral Oral  Oral  SpO2: 96% 93% 93% 95%  Weight:      Height:        Intake/Output Summary (Last 24 hours) at 10/08/2019 1244 Last data filed at 10/08/2019 1124 Gross per 24 hour  Intake 2419.2 ml  Output 2100 ml  Net 319.2 ml   Filed Weights   10/05/19 1252 10/06/19 0430 10/07/19 0344  Weight: 88.5 kg 87.4 kg 88 kg    Examination:   Constitutional: NAD, AAOx3 HEENT: conjunctivae and lids normal, EOMI CV: RRR no M,R,G. Distal pulses +2.  No  cyanosis.   RESP: lungs sounds more clear, no wheezes, normal respiratory effort, on 2L  GI: +BS, NTND Extremities: No effusions, edema, or tenderness in BLE SKIN: warm, dry and intact Neuro: II - XII grossly intact.  Sensation intact Psych: Normal mood and affect.  Appropriate judgement and reason   Data Reviewed: I have personally reviewed following labs and imaging studies  CBC: Recent Labs  Lab  10/03/19 0956 10/05/19 1304 10/07/19 0629 10/08/19 0530  WBC 9.0 9.9 7.6 7.3  NEUTROABS 7.4 8.7*  --   --   HGB 9.4* 9.7* 8.6* 8.5*  HCT 32.0* 33.1* 29.1* 28.1*  MCV 93.3 90.9 91.2 90.4  PLT 292 265 238 353   Basic Metabolic Panel: Recent Labs  Lab 10/05/19 1304 10/07/19 0629 10/08/19 0530  NA 141 142 142  K 5.2* 5.2* 4.5  CL 112* 112* 111  CO2 19* 25 25  GLUCOSE 212* 169* 182*  BUN 40* 44* 43*  CREATININE 1.46* 1.45* 1.26*  CALCIUM 8.9 8.6* 8.6*  MG  --  2.1 2.1   GFR: Estimated Creatinine Clearance: 34.7 mL/min (A) (by C-G formula based on SCr of 1.26 mg/dL (H)). Liver Function Tests: No results for input(s): AST, ALT, ALKPHOS, BILITOT, PROT, ALBUMIN in the last 168 hours. No results for input(s): LIPASE, AMYLASE in the last 168 hours. No results for input(s): AMMONIA in the last 168 hours. Coagulation Profile: No results for input(s): INR, PROTIME in the last 168 hours. Cardiac Enzymes: No results for input(s): CKTOTAL, CKMB, CKMBINDEX, TROPONINI in the last 168 hours. BNP (last 3 results) No results for input(s): PROBNP in the last 8760 hours. HbA1C: Recent Labs    10/07/19 0629  HGBA1C 6.4*   CBG: Recent Labs  Lab 10/07/19 1216 10/07/19 1712 10/07/19 2029 10/08/19 0758 10/08/19 1140  GLUCAP 192* 282* 383* 129* 230*   Lipid Profile: No results for input(s): CHOL, HDL, LDLCALC, TRIG, CHOLHDL, LDLDIRECT in the last 72 hours. Thyroid Function Tests: No results for input(s): TSH, T4TOTAL, FREET4, T3FREE, THYROIDAB in the last 72 hours. Anemia Panel: Recent Labs    10/05/19 1836  FERRITIN 48   Sepsis Labs: Recent Labs  Lab 10/05/19 1332  PROCALCITON <0.10  LATICACIDVEN 1.2    Recent Results (from the past 240 hour(s))  Blood culture (routine x 2)     Status: Abnormal   Collection Time: 10/05/19  1:39 PM   Specimen: BLOOD  Result Value Ref Range Status   Specimen Description   Final    BLOOD LAC Performed at [facility], [address], [place], [place] [address]    Special Requests   Final    BOTTLES DRAWN AEROBIC AND ANAEROBIC Blood Culture adequate volume Performed at [facility], [address] [place], [place] [address]    Culture  Setup Time   Final    AEROBIC BOTTLE ONLY GRAM POSITIVE COCCI CRITICAL RESULT CALLED TO, READ BACK BY AND VERIFIED WITH: LISA KLUTZ AT 1406 ON 10/06/19 [place]. Performed at [facility], [address] [place], [place] [address]    Culture (A)  Final    STAPHYLOCOCCUS SPECIES (COAGULASE NEGATIVE) THE SIGNIFICANCE OF ISOLATING THIS ORGANISM FROM A SINGLE SET OF BLOOD CULTURES WHEN MULTIPLE SETS ARE DRAWN IS UNCERTAIN. PLEASE NOTIFY THE MICROBIOLOGY DEPARTMENT WITHIN ONE WEEK IF SPECIATION AND SENSITIVITIES ARE REQUIRED. Performed at [facility], [place] [address] [place], [place] [address]    Report Status 10/08/2019 FINAL  Final  Blood culture (routine x  2)     Status: None (Preliminary result)   Collection Time: 10/05/19  2:37 PM   Specimen: BLOOD  Result Value Ref Range Status   Specimen Description BLOOD LEFT ANTECUBITAL  Final   Special Requests   Final    BOTTLES DRAWN AEROBIC AND ANAEROBIC Blood Culture adequate volume   Culture   Final    NO GROWTH 3 DAYS Performed at [facility], [address], [place], [place] [address]    Report Status PENDING  Incomplete  Respiratory Panel by RT PCR (Flu A&B, Covid) - Nasopharyngeal Swab     Status: None   Collection Time: 10/05/19  2:37 PM   Specimen: Nasopharyngeal Swab  Result Value Ref Range Status   SARS Coronavirus 2 by RT PCR NEGATIVE NEGATIVE Final    Comment: (NOTE) SARS-CoV-2 target nucleic acids are NOT DETECTED. The SARS-CoV-2 RNA is generally detectable in upper respiratoy specimens during the acute phase of infection. The lowest concentration of SARS-CoV-2 viral copies this assay can detect is 131 copies/mL. A negative result does not preclude SARS-Cov-2 infection and  should not be used as the sole basis for treatment or other patient management decisions. A negative result may occur with  improper specimen collection/handling, submission of specimen other than nasopharyngeal swab, presence of viral mutation(s) within the areas targeted by this assay, and inadequate number of viral copies (<131 copies/mL). A negative result must be combined with clinical observations, patient history, and epidemiological information. The expected result is Negative. Fact Sheet for Patients:  PinkCheek.be Fact Sheet for Healthcare Providers:  GravelBags.it This test is not yet ap proved or cleared by the [place] FDA and  has been authorized for detection and/or diagnosis of SARS-CoV-2 by FDA under an Emergency Use Authorization (EUA). This EUA will remain  in effect (meaning this test can be used) for the duration of the COVID-19 declaration under Section 564(b)(1) of the Act, 21 U.S.C. section 360bbb-3(b)(1), unless the authorization is terminated or revoked sooner.    Influenza A by PCR NEGATIVE NEGATIVE Final   Influenza B by PCR NEGATIVE NEGATIVE Final    Comment: (NOTE) The Xpert Xpress SARS-CoV-2/FLU/RSV assay is intended as an aid in  the diagnosis of influenza from Nasopharyngeal swab specimens and  should not be used as a sole basis for treatment. Nasal washings and  aspirates are unacceptable for Xpert Xpress SARS-CoV-2/FLU/RSV  testing. Fact Sheet for Patients: PinkCheek.be Fact Sheet for Healthcare Providers: GravelBags.it This test is not yet approved or cleared by the [place] FDA and  has been authorized for detection and/or diagnosis of SARS-CoV-2 by  FDA under an Emergency Use Authorization (EUA). This EUA will remain  in effect (meaning this test can be used) for the duration of the  Covid-19 declaration under Section  564(b)(1) of the Act, 21  U.S.C. section 360bbb-3(b)(1), unless the authorization is  terminated or revoked. Performed at [facility], [address], [place],  [address]       Radiology Studies: No results found.   Scheduled Meds: . amLODipine  5 mg Oral Daily  . apixaban  2.5 mg Oral BID  . atorvastatin  80 mg Oral Daily  . cloNIDine  0.1 mg Oral BID  . furosemide  40 mg Intravenous Daily  . insulin aspart  0-9 Units Subcutaneous TID WC  . insulin aspart  3 Units Subcutaneous TID WC  . insulin detemir  20 Units Subcutaneous BID AC  . ipratropium-albuterol  3 mL Nebulization Q6H  .  lisinopril  10 mg Oral Daily  . pantoprazole  40 mg Oral QHS  . predniSONE  40 mg Oral Q breakfast  . senna-docusate  2 tablet Oral BID  . sodium chloride flush  3 mL Intravenous Q12H   Continuous Infusions: . sodium chloride Stopped (10/06/19 1607)  . azithromycin Stopped (10/07/19 1402)     LOS: 3 days     Sidney Ace, MD [facility] If 7PM-7AM, please contact night-coverage 10/08/2019, 12:44 PM

## 2019-10-08 NOTE — Progress Notes (Signed)
Physical Therapy Treatment Patient Details Name: Dorothy [name] MRN: [id number] DOB: 22-Dec-1934 Today's Date: 10/08/2019    History of Present Illness Dorothy [name] is an 38yoF who [facility] c SOB. The patient was tachypneic, hypertensive and hypoxemic in the emergency room.  Oxygen saturation was 78% on room air.  Procalcitonin and lactic acid levels were normal.  Covid test was negative.  BNP was elevated at 248.  She was placed on 4 L oxygen via nasal cannula. PMH: chronic diastolic CHF, COPD, diabetes mellitus, CAD, hypertension, hyperlipidemia, obstructive sleep apnea, arthritis, anemia.    PT Comments    Pt was seated in recliner upon arriving. Pt on 2 L nasal cannula with O2 level 98%. She had IV antibiotics running throughout session. Attempted wean of O2 however pt de-sat to 81% on 2 L with ambulation to doorway. When O2 increased to 4L pt O2 levels >88% throughout. Once returned to sitting in recliner and performing breathing techniques was able to return to 2 L while maintaining >90% O2 level. Pt required supervision with transfers and CGA during gait for safety. Ambulated total of 179ft with one standing rest while using walker. Pt was seated in recliner with chair alarm set and call bell in reach at conclusion of session. Pt is progressing well with PT. Will continue to follow pt per POC.    Follow Up Recommendations  Home health PT     Equipment Recommendations  Other (comment)(O2)    Recommendations for Other Services       Precautions / Restrictions Precautions Precautions: Fall Precaution Comments: O2 needed at this time Restrictions Weight Bearing Restrictions: No    Mobility  Bed Mobility                  Transfers Overall transfer level: Needs assistance Equipment used: Rolling walker (2 wheeled);None(portable O2) Transfers: Sit to/from Stand Sit to Stand: Supervision         General transfer comment: Pt performed sit to stand to/from  recliner 4 x throughout session. supervision for safety  Ambulation/Gait Ambulation/Gait assistance: Min guard Gait Distance (Feet): 100 Feet Assistive device: Rolling walker (2 wheeled);None Gait Pattern/deviations: WFL(Within Functional Limits)     General Gait Details: Pt demonstarted safe gait kinematics with use of FWW however requires O2 throughout.    Stairs             Wheelchair Mobility    Modified Rankin (Stroke Patients Only)       Balance Overall balance assessment: Modified Independent;Mild deficits observed, not formally tested                                          Cognition Arousal/Alertness: Awake/alert Behavior During Therapy: WFL for tasks assessed/performed Overall Cognitive Status: Within Functional Limits for tasks assessed                                 General Comments: pt is A and O x 4      Exercises      General Comments        Pertinent Vitals/Pain Pain Assessment: No/denies pain    Home Living                      Prior Function  PT Goals (current goals can now be found in the care plan section) Acute Rehab PT Goals Patient Stated Goal: I just want to go home Progress towards PT goals: Progressing toward goals    Frequency    Min 2X/week      PT Plan Current plan remains appropriate    Co-evaluation              AM-PAC PT "6 Clicks" Mobility   Outcome Measure  Help needed turning from your back to your side while in a flat bed without using bedrails?: None Help needed moving from lying on your back to sitting on the side of a flat bed without using bedrails?: None Help needed moving to and from a bed to a chair (including a wheelchair)?: A Little Help needed standing up from a chair using your arms (e.g., wheelchair or bedside chair)?: A Little Help needed to walk in hospital room?: A Little Help needed climbing 3-5 steps with a railing? : A  Little 6 Click Score: 20    End of Session Equipment Utilized During Treatment: Gait belt;Oxygen Activity Tolerance: Patient tolerated treatment well Patient left: in chair;with call bell/phone within reach;with chair alarm set Nurse Communication: Mobility status PT Visit Diagnosis: Difficulty in walking, not elsewhere classified (R26.2);Other abnormalities of gait and mobility (R26.89)     Time: 1533-1550 PT Time Calculation (min) (ACUTE ONLY): 17 min  Charges:  $Gait Training: 8-22 mins                     Julaine Fusi PTA 10/08/19, 4:10 PM

## 2019-10-08 NOTE — Plan of Care (Signed)
  Problem: Activity: Goal: Ability to tolerate increased activity will improve Outcome: Progressing   Problem: Respiratory: Goal: Ability to maintain a clear airway will improve Outcome: Progressing   

## 2019-10-08 NOTE — Plan of Care (Signed)
DOE continues, Oxygen at [facility].

## 2019-10-08 NOTE — Care Management Important Message (Signed)
Important Message  Patient Details  Name: Dorothy [name] MRN: [id number] Date of Birth: [birth year]   Medicare Important Message Given:  Yes     Dannette Barbara 10/08/2019, 10:51 AM

## 2019-10-09 LAB — BASIC METABOLIC PANEL
Anion gap: 10 (ref 5–15)
BUN: 43 mg/dL — ABNORMAL HIGH (ref 8–23)
CO2: 23 mmol/L (ref 22–32)
Calcium: 8.6 mg/dL — ABNORMAL LOW (ref 8.9–10.3)
Chloride: 111 mmol/L (ref 98–111)
Creatinine, Ser: 1.39 mg/dL — ABNORMAL HIGH (ref 0.44–1.00)
GFR calc Af Amer: 41 mL/min — ABNORMAL LOW (ref >60)
GFR calc non Af Amer: 35 mL/min — ABNORMAL LOW (ref >60)
Glucose, Bld: 127 mg/dL — ABNORMAL HIGH (ref 70–99)
Potassium: 4.6 mmol/L (ref 3.5–5.1)
Sodium: 144 mmol/L (ref 135–145)

## 2019-10-09 LAB — CBC
HCT: 28.9 % — ABNORMAL LOW (ref 36.0–46.0)
Hemoglobin: 8.8 g/dL — ABNORMAL LOW (ref 12.0–15.0)
MCH: 27.6 pg (ref 26.0–34.0)
MCHC: 30.4 g/dL (ref 30.0–36.0)
MCV: 90.6 fL (ref 80.0–100.0)
Platelets: 250 10*3/uL (ref 150–400)
RBC: 3.19 MIL/uL — ABNORMAL LOW (ref 3.87–5.11)
RDW: 15.2 % (ref 11.5–15.5)
WBC: 6.9 10*3/uL (ref 4.0–10.5)
nRBC: 0 % (ref 0.0–0.2)

## 2019-10-09 LAB — GLUCOSE, CAPILLARY
Glucose-Capillary: 122 mg/dL — ABNORMAL HIGH (ref 70–99)
Glucose-Capillary: 180 mg/dL — ABNORMAL HIGH (ref 70–99)
Glucose-Capillary: 286 mg/dL — ABNORMAL HIGH (ref 70–99)
Glucose-Capillary: 374 mg/dL — ABNORMAL HIGH (ref 70–99)

## 2019-10-09 LAB — MAGNESIUM: Magnesium: 2 mg/dL (ref 1.7–2.4)

## 2019-10-09 MED ORDER — INSULIN ASPART 100 UNIT/ML ~~LOC~~ SOLN
0.0000 [IU] | Freq: Three times a day (TID) | SUBCUTANEOUS | Status: DC
  Administered 2019-10-09: 21:00:00 9 [IU] via SUBCUTANEOUS
  Administered 2019-10-10: 3 [IU] via SUBCUTANEOUS
  Administered 2019-10-10: 9 [IU] via SUBCUTANEOUS
  Administered 2019-10-10: 1 [IU] via SUBCUTANEOUS
  Administered 2019-10-11: 2 [IU] via SUBCUTANEOUS
  Administered 2019-10-11 (×2): 1 [IU] via SUBCUTANEOUS
  Filled 2019-10-09 (×7): qty 1

## 2019-10-09 MED ORDER — IPRATROPIUM-ALBUTEROL 0.5-2.5 (3) MG/3ML IN SOLN
3.0000 mL | Freq: Three times a day (TID) | RESPIRATORY_TRACT | Status: DC
  Administered 2019-10-09 – 2019-10-11 (×6): 3 mL via RESPIRATORY_TRACT
  Filled 2019-10-09 (×6): qty 3

## 2019-10-09 NOTE — Progress Notes (Signed)
PROGRESS NOTE    Dorothy [name]  YCX:[id number] DOB: 1935/08/01 DOA: 10/05/2019 PCP: Letta Median, MD   Dorothy [name] is an 85 y.o. Caucasian female with medical history significant for chronic diastolic CHF, COPD, diabetes mellitus, CAD, hypertension, hyperlipidemia, obstructive sleep apnea, arthritis, anemia, presented to the hospital because of shortness of breath.  The patient was tachypneic, hypertensive and hypoxemic in the emergency room.  Oxygen saturation was 78% on room air.  Procalcitonin and lactic acid levels were normal.  Covid test was negative.  BNP was elevated at 248.  She was placed on 4 L oxygen via nasal cannula.  He was also given IV Solu-Medrol, IV azithromycin and IV Rocephin in the ER.  1/27: Remains on 2 L supplemental oxygen.  -500 cc over interval.  Continues to endorse shortness of breath  1/28: Patient remains on 2 L nasal cannula.  Reports some mild improvement in respiratory status.  Diuresed effectively yesterday.  Net -1.4 L     Assessment & Plan:   Principal Problem:   Multifocal pneumonia Active Problems:   Acute hypoxemic respiratory failure (HCC)   # Acute hypoxemic respiratory failure  --Suspected CHF exacerbation is more likely primary contributing factor --Pt presented with new 4L O2 requirement.   -CXR showed "Unchanged appearance of the chest with multifocal bilateral airspace opacities" and procal neg, so PNA less likely.   Ceftriaxone d/c'ed. --Treat both as below -- 1/28: Currently on 2 L.  Will attempt to wean.  Incentive spirometry to be encouraged.  If unable to wean from oxygen within the next 24 to 48 hours will prescribe home oxygen therapy  # COPD exacerbation  --continue suppl O2 with goal 88-92%, and wean as able --continue azithromycin for severe COPD exacerbation, 5 day course, end date 1/29 --continue steroid as prednisone 40 mg daily --DuoNeb q6h scheduled  # Possible diastolic CHF exacerbation --BNP 248,  bilateral airspace opacities and crackles present on presentation.  Received IV lasix 40 mg with good response (Cr stable, good urine output, reduced O2 requirement). PLAN: --continue IV lasix 40 mg daily, monitor creatinine and watch for contraction alkalosis - 2g sodium diet - 2L fluid restriction - Goal net negative 1-1.5L daily - Weigh daily - Strict I/O  Mild hyperkalemia:  Discontinue potassium supplement.  Expected to improve with IV Lasix.  Elevated BUN/creatinine:  Probably due to underlying CKD stage IIIb.  Monitor renal function.  Insulin-dependent diabetes mellitus:  Continue Levemir 20u BID Add 3u meal-time TID SSI TID, no night-time coverage.  Hypertension:  Continue amlodipine and clonidine Continue lisinopril   Paroxysmal Atrial fibrillation:  No rate-control agent on home med list.  HR controlled. Continue Eliquis   DVT prophylaxis: SH:[id number] Code Status: Full code  Family Communication: Spoke with daughter [name] via phone, [phone] Disposition Plan: discharge when weaned back to RA, or can discharge on 1-2L suppl O2 for short-term since pt does have qualifying dx for home O2.  [facility].  Pt lives alone.   Subjective and Interval History:  Seen and examined No acute events    Objective: Vitals:   10/08/19 1534 10/08/19 1950 10/09/19 0440 10/09/19 0742  BP: (!) 143/59 139/65 (!) 158/65 (!) 159/65  Pulse: 70 82 76 69  Resp: 18 20 20 16   Temp: (!) 97.4 F (36.3 C) 97.8 F (36.6 C) 98.7 F (37.1 C) 98.5 F (36.9 C)  TempSrc: Oral Oral Oral Oral  SpO2: 94% 100% 95% 95%  Weight:   86.2  kg   Height:        Intake/Output Summary (Last 24 hours) at 10/09/2019 1218 Last data filed at 10/09/2019 0508 Gross per 24 hour  Intake 250 ml  Output 1100 ml  Net -850 ml   Filed Weights   10/06/19 0430 10/07/19 0344 10/09/19 0440  Weight: 87.4 kg 88 kg 86.2 kg    Examination:   Constitutional: NAD, AAOx3 HEENT: conjunctivae and  lids normal, EOMI CV: RRR no M,R,G. Distal pulses +2.  No cyanosis.   RESP: lungs sounds more clear, no wheezes, normal respiratory effort, on 2L  GI: +BS, NTND Extremities: No effusions, edema, or tenderness in BLE SKIN: warm, dry and intact Neuro: II - XII grossly intact.  Sensation intact Psych: Normal mood and affect.  Appropriate judgement and reason   Data Reviewed: I have personally reviewed following labs and imaging studies  CBC: Recent Labs  Lab 10/03/19 0956 10/05/19 1304 10/07/19 0629 10/08/19 0530 10/09/19 0722  WBC 9.0 9.9 7.6 7.3 6.9  NEUTROABS 7.4 8.7*  --   --   --   HGB 9.4* 9.7* 8.6* 8.5* 8.8*  HCT 32.0* 33.1* 29.1* 28.1* 28.9*  MCV 93.3 90.9 91.2 90.4 90.6  PLT 292 265 238 245 762   Basic Metabolic Panel: Recent Labs  Lab 10/05/19 1304 10/07/19 0629 10/08/19 0530 10/09/19 0616  NA 141 142 142 144  K 5.2* 5.2* 4.5 4.6  CL 112* 112* 111 111  CO2 19* 25 25 23   GLUCOSE 212* 169* 182* 127*  BUN 40* 44* 43* 43*  CREATININE 1.46* 1.45* 1.26* 1.39*  CALCIUM 8.9 8.6* 8.6* 8.6*  MG  --  2.1 2.1 2.0   GFR: Estimated Creatinine Clearance: 31.1 mL/min (A) (by C-G formula based on SCr of 1.39 mg/dL (H)). Liver Function Tests: No results for input(s): AST, ALT, ALKPHOS, BILITOT, PROT, ALBUMIN in the last 168 hours. No results for input(s): LIPASE, AMYLASE in the last 168 hours. No results for input(s): AMMONIA in the last 168 hours. Coagulation Profile: No results for input(s): INR, PROTIME in the last 168 hours. Cardiac Enzymes: No results for input(s): CKTOTAL, CKMB, CKMBINDEX, TROPONINI in the last 168 hours. BNP (last 3 results) No results for input(s): PROBNP in the last 8760 hours. HbA1C: Recent Labs    10/07/19 0629  HGBA1C 6.4*   CBG: Recent Labs  Lab 10/08/19 1140 10/08/19 1648 10/08/19 2041 10/09/19 0804 10/09/19 1154  GLUCAP 230* 319* 311* 122* 180*   Lipid Profile: No results for input(s): CHOL, HDL, LDLCALC, TRIG, CHOLHDL,  LDLDIRECT in the last 72 hours. Thyroid Function Tests: No results for input(s): TSH, T4TOTAL, FREET4, T3FREE, THYROIDAB in the last 72 hours. Anemia Panel: No results for input(s): VITAMINB12, FOLATE, FERRITIN, TIBC, IRON, RETICCTPCT in the last 72 hours. Sepsis Labs: Recent Labs  Lab 10/05/19 1332  PROCALCITON <0.10  LATICACIDVEN 1.2    Recent Results (from the past 240 hour(s))  Blood culture (routine x 2)     Status: Abnormal   Collection Time: 10/05/19  1:39 PM   Specimen: BLOOD  Result Value Ref Range Status   Specimen Description   Final    BLOOD LAC Performed at [facility], [address] [place], [place] [address]    Special Requests   Final    BOTTLES DRAWN AEROBIC AND ANAEROBIC Blood Culture adequate volume Performed at [facility] [address] [place], [place] [address]    Culture  Setup Time   Final    AEROBIC  BOTTLE ONLY GRAM POSITIVE COCCI CRITICAL RESULT CALLED TO, READ BACK BY AND VERIFIED WITH: LISA KLUTZ AT 1406 ON 10/06/19 [place]. Performed at [facility], [address] [place], [place] [address]    Culture (A)  Final    STAPHYLOCOCCUS SPECIES (COAGULASE NEGATIVE) THE SIGNIFICANCE OF ISOLATING THIS ORGANISM FROM A SINGLE SET OF BLOOD CULTURES WHEN MULTIPLE SETS ARE DRAWN IS UNCERTAIN. PLEASE NOTIFY THE MICROBIOLOGY DEPARTMENT WITHIN ONE WEEK IF SPECIATION AND SENSITIVITIES ARE REQUIRED. Performed at [facility], [place] [address], [place], [place] [address]    Report Status 10/08/2019 FINAL  Final  Blood culture (routine x 2)     Status: None (Preliminary result)   Collection Time: 10/05/19  2:37 PM   Specimen: BLOOD  Result Value Ref Range Status   Specimen Description BLOOD LEFT ANTECUBITAL  Final   Special Requests   Final    BOTTLES DRAWN AEROBIC AND ANAEROBIC Blood Culture adequate volume   Culture   Final    NO GROWTH 4 DAYS Performed at [facility], [address], [place], [place]  [address]    Report Status PENDING  Incomplete  Respiratory Panel by RT PCR (Flu A&B, Covid) - Nasopharyngeal Swab     Status: None   Collection Time: 10/05/19  2:37 PM   Specimen: Nasopharyngeal Swab  Result Value Ref Range Status   SARS Coronavirus 2 by RT PCR NEGATIVE NEGATIVE Final    Comment: (NOTE) SARS-CoV-2 target nucleic acids are NOT DETECTED. The SARS-CoV-2 RNA is generally detectable in upper respiratoy specimens during the acute phase of infection. The lowest concentration of SARS-CoV-2 viral copies this assay can detect is 131 copies/mL. A negative result does not preclude SARS-Cov-2 infection and should not be used as the sole basis for treatment or other patient management decisions. A negative result may occur with  improper specimen collection/handling, submission of specimen other than nasopharyngeal swab, presence of viral mutation(s) within the areas targeted by this assay, and inadequate number of viral copies (<131 copies/mL). A negative result must be combined with clinical observations, patient history, and epidemiological information. The expected result is Negative. Fact Sheet for Patients:  PinkCheek.be Fact Sheet for Healthcare Providers:  GravelBags.it This test is not yet ap proved or cleared by the [place] FDA and  has been authorized for detection and/or diagnosis of SARS-CoV-2 by FDA under an Emergency Use Authorization (EUA). This EUA will remain  in effect (meaning this test can be used) for the duration of the COVID-19 declaration under Section 564(b)(1) of the Act, 21 U.S.C. section 360bbb-3(b)(1), unless the authorization is terminated or revoked sooner.    Influenza A by PCR NEGATIVE NEGATIVE Final   Influenza B by PCR NEGATIVE NEGATIVE Final    Comment: (NOTE) The Xpert Xpress SARS-CoV-2/FLU/RSV assay is intended as an aid in  the diagnosis of influenza from Nasopharyngeal swab  specimens and  should not be used as a sole basis for treatment. Nasal washings and  aspirates are unacceptable for Xpert Xpress SARS-CoV-2/FLU/RSV  testing. Fact Sheet for Patients: PinkCheek.be Fact Sheet for Healthcare Providers: GravelBags.it This test is not yet approved or cleared by the [place] FDA and  has been authorized for detection and/or diagnosis of SARS-CoV-2 by  FDA under an Emergency Use Authorization (EUA). This EUA will remain  in effect (meaning this test can be used) for the duration of the  Covid-19 declaration under Section 564(b)(1) of the Act, 21  U.S.C. section 360bbb-3(b)(1), unless the authorization  is  terminated or revoked. Performed at [facility], [address], [place], [place] [address]       Radiology Studies: No results found.   Scheduled Meds: . amLODipine  5 mg Oral Daily  . apixaban  2.5 mg Oral BID  . atorvastatin  80 mg Oral Daily  . cloNIDine  0.1 mg Oral BID  . furosemide  40 mg Intravenous Daily  . insulin aspart  0-9 Units Subcutaneous TID WC  . insulin aspart  3 Units Subcutaneous TID WC  . insulin detemir  20 Units Subcutaneous BID AC  . ipratropium-albuterol  3 mL Nebulization Q6H  . lisinopril  10 mg Oral Daily  . pantoprazole  40 mg Oral QHS  . predniSONE  40 mg Oral Q breakfast  . senna-docusate  2 tablet Oral BID  . sodium chloride flush  3 mL Intravenous Q12H   Continuous Infusions: . sodium chloride Stopped (10/06/19 1607)  . azithromycin Stopped (10/08/19 1505)     LOS: 4 days   Time spent: 35 minutes   Sidney Ace, MD [facility] If 7PM-7AM, please contact night-coverage 10/09/2019, 12:18 PM

## 2019-10-09 NOTE — Plan of Care (Signed)
  Problem: Respiratory: Goal: Ability to maintain adequate ventilation will improve Outcome: Progressing Goal: Ability to maintain a clear airway will improve Outcome: Progressing   Problem: Activity: Goal: Risk for activity intolerance will decrease Outcome: Progressing   Problem: Safety: Goal: Ability to remain free from injury will improve Outcome: Progressing

## 2019-10-09 NOTE — TOC Initial Note (Signed)
Transition of Care [facility]) - Initial/Assessment Note    Patient Details  Name: Dorothy [name] MRN: [id number] Date of Birth: 22-Apr-1935  Transition of [facility]) CM/SW Contact:    Shelbie Ammons, RN Phone Number: 10/09/2019, 1:11 PM  Clinical Narrative:   RNCM assessed patient at bedside, patient in no acute distress talking on phone on arrival. Patient reports to feeling better each day and almost feels well enough that she could go home on her own however patient reports that her daughter from [place] for her to go home with her for a couple of weeks until she is feeling better. Discussed that therapy has recommended home health for her however patient reports that she doesn't feel she needs it if she is going to her daughters. Did discuss that if she comes home after a couple of weeks and still feels she might need HH that her PCP can order. Discussed that MD thinks she will likely need to discharge with oxygen for which patient verbalizes understanding. Patient is agreeable to this CM contacting [employer] to arrange oxygen. RNCM placed call to [facility] and gave information for oxygen. RNCM will continue to follow for any needs.                Expected Discharge Plan: Home/Self Care Barriers to Discharge: Continued Medical Work up   Patient Goals and CMS Choice Patient states their goals for this hospitalization and ongoing recovery are:: To be able to get back to taking care of myself independently.      Expected Discharge Plan and Services Expected Discharge Plan: Home/Self Care   Discharge Planning Services: CM Consult   Living arrangements for the past 2 months: Single Family Home                 DME Arranged: Oxygen DME Agency: [employer] Date DME Agency Contacted: 10/09/19 Time DME Agency Contacted: 67 Representative spoke with at DME Agency: Leroy Sea            Prior Living Arrangements/Services Living arrangements for the past 2 months: [place]  with:: Adult Children Patient language and need for interpreter reviewed:: Yes Do you feel safe going back to the place where you live?: Yes      Need for Family Participation in Patient Care: Yes (Comment) Care giver support system in place?: Yes (comment)   Criminal Activity/Legal Involvement Pertinent to Current Situation/Hospitalization: No - Comment as needed  Activities of Daily Living Home Assistive Devices/Equipment: None ADL Screening (condition at time of admission) Patient's cognitive ability adequate to safely complete daily activities?: Yes Is the patient deaf or have difficulty hearing?: No Does the patient have difficulty seeing, even when wearing glasses/contacts?: Yes Does the patient have difficulty concentrating, remembering, or making decisions?: No Patient able to express need for assistance with ADLs?: Yes Does the patient have difficulty dressing or bathing?: No Independently performs ADLs?: Yes (appropriate for developmental age) Does the patient have difficulty walking or climbing stairs?: No Weakness of Legs: None Weakness of Arms/Hands: None  Permission Sought/Granted                  Emotional Assessment Appearance:: Appears stated age Attitude/Demeanor/Rapport: Engaged   Orientation: : Oriented to Self, Oriented to Place, Oriented to  Time, Oriented to Situation Alcohol / Substance Use: Never Used Psych Involvement: No (comment)  Admission diagnosis:  Pneumonia [J18.9] Hypoxia [R09.02] Multifocal pneumonia [J18.9] Pneumonia of right lung due to infectious organism, unspecified part of lung [  J18.9] Pneumonia of left lung due to infectious organism, unspecified part of lung [J18.9] Patient Active Problem List   Diagnosis Date Noted  . Multifocal pneumonia 10/05/2019  . Acute hypoxemic respiratory failure ([place]) 10/05/2019  . Diabetes ([place]) 05/04/2017  . Iron deficiency anemia 12/28/2016  . Chronic diastolic heart failure ([place]) 12/26/2016  .  HTN (hypertension) 12/26/2016  . Atrial fibrillation ([place]) 12/26/2016  . B12 deficiency   . Sepsis ([place]) 12/18/2016  . CAP (community acquired pneumonia) 12/18/2016  . Anemia 12/18/2016   PCP:  Letta Median, MD Pharmacy:   [facility], [address] [place] [address] Phone: [phone] Fax: [phone]     Social Determinants of Health (SDOH) Interventions    Readmission Risk Interventions No flowsheet data found.

## 2019-10-09 NOTE — Progress Notes (Signed)
Physical Therapy Treatment Patient Details Name: Dorothy [name] MRN: [id number] DOB: 08-05-1935 Today's Date: 10/09/2019    History of Present Illness Dorothy [name] is an 57yoF who [facility] on 1/[facility] c SOB. The patient was tachypneic, hypertensive and hypoxemic in the emergency room.  Oxygen saturation was 78% on room air.  Procalcitonin and lactic acid levels were normal.  Covid test was negative.  BNP was elevated at 248.  She was placed on 4 L oxygen via nasal cannula. PMH: chronic diastolic CHF, COPD, diabetes mellitus, CAD, hypertension, hyperlipidemia, obstructive sleep apnea, arthritis, anemia.    PT Comments    Pt resting in chair upon PT arrival and agreeable to PT session.  Able to walk 120 feet and then 80 feet with RW CGA (distance limited per therapist based on SOB symptoms).  Overall pt steady with ambulation using RW.  O2 93% or greater on 2 L O2 via nasal cannula throughout sessions activities.  Pt educated on pursed lip breathing, pacing, activity modification, and O2 tubing management to prevent falls (in case pt needs O2 upon hospital discharge): pt verbalizing appropriate understanding.  Will continue to focus on increasing activity tolerance, overall strengthening, and progressive functional mobility during hospitalization.    Follow Up Recommendations  Home health PT     Equipment Recommendations  Rolling walker with 5" wheels    Recommendations for Other Services       Precautions / Restrictions Precautions Precautions: Fall Precaution Comments: monitor O2 sats Restrictions Weight Bearing Restrictions: No    Mobility  Bed Mobility               General bed mobility comments: Deferred (pt sitting up in recliner beginning and end of session)  Transfers Overall transfer level: Needs assistance Equipment used: Rolling walker (2 wheeled) Transfers: Sit to/from Stand Sit to Stand: Supervision         General transfer comment: x2 trials standing  from recliner; 1st trial pt appearing stiff in general (pt reporting sitting for a while in chair) but still steady; improved ease of standing 2nd trial  Ambulation/Gait Ambulation/Gait assistance: Min guard Gait Distance (Feet): (120 feet; 80 feet) Assistive device: Rolling walker (2 wheeled)   Gait velocity: decreased   General Gait Details: step through gait pattern; pt tending to push RW farther further creating more of a flexed posture (requiring vc's to stay closer to walker)   Stairs             Wheelchair Mobility    Modified Rankin (Stroke Patients Only)       Balance Overall balance assessment: Needs assistance Sitting-balance support: No upper extremity supported;Feet supported Sitting balance-Leahy Scale: Normal Sitting balance - Comments: steady sitting reaching outside BOS   Standing balance support: No upper extremity supported Standing balance-Leahy Scale: Good Standing balance comment: steady standing reaching within BOS                            Cognition Arousal/Alertness: Awake/alert Behavior During Therapy: WFL for tasks assessed/performed Overall Cognitive Status: Within Functional Limits for tasks assessed                                        Exercises      General Comments   Nursing cleared pt for participation in physical therapy.  Pt agreeable to PT session.  Pertinent Vitals/Pain Pain Assessment: No/denies pain  HR WFL during session's activities.    Home Living                      Prior Function            PT Goals (current goals can now be found in the care plan section) Acute Rehab PT Goals Patient Stated Goal: to go home PT Goal Formulation: With patient Time For Goal Achievement: 10/21/19 Potential to Achieve Goals: Good Progress towards PT goals: Progressing toward goals    Frequency    Min 2X/week      PT Plan Current plan remains appropriate    Co-evaluation               AM-PAC PT "6 Clicks" Mobility   Outcome Measure  Help needed turning from your back to your side while in a flat bed without using bedrails?: None Help needed moving from lying on your back to sitting on the side of a flat bed without using bedrails?: None Help needed moving to and from a bed to a chair (including a wheelchair)?: A Little Help needed standing up from a chair using your arms (e.g., wheelchair or bedside chair)?: A Little Help needed to walk in hospital room?: A Little Help needed climbing 3-5 steps with a railing? : A Little 6 Click Score: 20    End of Session Equipment Utilized During Treatment: Gait belt;Oxygen(2 L O2 via nasal cannula) Activity Tolerance: Patient tolerated treatment well Patient left: in chair;with call bell/phone within reach;with chair alarm set;Other (comment)(B heels floating via pillow) Nurse Communication: Mobility status PT Visit Diagnosis: Difficulty in walking, not elsewhere classified (R26.2);Other abnormalities of gait and mobility (R26.89)     Time: 2800-3491 PT Time Calculation (min) (ACUTE ONLY): 31 min  Charges:  $Gait Training: 8-22 mins $Therapeutic Exercise: 8-22 mins                    Leitha Bleak, PT 10/09/19, 4:07 PM

## 2019-10-10 LAB — BASIC METABOLIC PANEL
Anion gap: 10 (ref 5–15)
BUN: 43 mg/dL — ABNORMAL HIGH (ref 8–23)
CO2: 25 mmol/L (ref 22–32)
Calcium: 8.8 mg/dL — ABNORMAL LOW (ref 8.9–10.3)
Chloride: 108 mmol/L (ref 98–111)
Creatinine, Ser: 1.4 mg/dL — ABNORMAL HIGH (ref 0.44–1.00)
GFR calc Af Amer: 40 mL/min — ABNORMAL LOW (ref >60)
GFR calc non Af Amer: 35 mL/min — ABNORMAL LOW (ref >60)
Glucose, Bld: 101 mg/dL — ABNORMAL HIGH (ref 70–99)
Potassium: 4.2 mmol/L (ref 3.5–5.1)
Sodium: 143 mmol/L (ref 135–145)

## 2019-10-10 LAB — CBC WITH DIFFERENTIAL/PLATELET
Abs Immature Granulocytes: 0.07 10*3/uL (ref 0.00–0.07)
Basophils Absolute: 0 10*3/uL (ref 0.0–0.1)
Basophils Relative: 0 %
Eosinophils Absolute: 0.1 10*3/uL (ref 0.0–0.5)
Eosinophils Relative: 1 %
HCT: 32.3 % — ABNORMAL LOW (ref 36.0–46.0)
Hemoglobin: 9.8 g/dL — ABNORMAL LOW (ref 12.0–15.0)
Immature Granulocytes: 1 %
Lymphocytes Relative: 14 %
Lymphs Abs: 1.4 10*3/uL (ref 0.7–4.0)
MCH: 27.2 pg (ref 26.0–34.0)
MCHC: 30.3 g/dL (ref 30.0–36.0)
MCV: 89.7 fL (ref 80.0–100.0)
Monocytes Absolute: 0.6 10*3/uL (ref 0.1–1.0)
Monocytes Relative: 5 %
Neutro Abs: 8 10*3/uL — ABNORMAL HIGH (ref 1.7–7.7)
Neutrophils Relative %: 79 %
Platelets: 262 10*3/uL (ref 150–400)
RBC: 3.6 MIL/uL — ABNORMAL LOW (ref 3.87–5.11)
RDW: 15.1 % (ref 11.5–15.5)
WBC: 10.2 10*3/uL (ref 4.0–10.5)
nRBC: 0 % (ref 0.0–0.2)

## 2019-10-10 LAB — GLUCOSE, CAPILLARY
Glucose-Capillary: 122 mg/dL — ABNORMAL HIGH (ref 70–99)
Glucose-Capillary: 100 mg/dL — ABNORMAL HIGH (ref 70–99)
Glucose-Capillary: 98 mg/dL (ref 70–99)
Glucose-Capillary: 109 mg/dL — ABNORMAL HIGH (ref 70–99)
Glucose-Capillary: 241 mg/dL — ABNORMAL HIGH (ref 70–99)
Glucose-Capillary: 356 mg/dL — ABNORMAL HIGH (ref 70–99)

## 2019-10-10 LAB — CULTURE, BLOOD (ROUTINE X 2)
Culture: NO GROWTH
Special Requests: ADEQUATE

## 2019-10-10 LAB — MAGNESIUM: Magnesium: 2 mg/dL (ref 1.7–2.4)

## 2019-10-10 NOTE — Progress Notes (Signed)
SATURATION QUALIFICATIONS: (This note is used to comply with regulatory documentation for home oxygen)  Patient Saturations on Room Air at Rest = 96%  Patient Saturations on Room Air while Ambulating = 87%  Patient Saturations on 2L Liters of oxygen  = 100%

## 2019-10-10 NOTE — Progress Notes (Signed)
PROGRESS NOTE    Dorothy [name]  TKP:[id number] DOB: March 07, 1935 DOA: 10/05/2019 PCP: Letta Median, MD   Dorothy [name] is an 85 y.o. Caucasian female with medical history significant for chronic diastolic CHF, COPD, diabetes mellitus, CAD, hypertension, hyperlipidemia, obstructive sleep apnea, arthritis, anemia, presented to the hospital because of shortness of breath.  The patient was tachypneic, hypertensive and hypoxemic in the emergency room.  Oxygen saturation was 78% on room air.  Procalcitonin and lactic acid levels were normal.  Covid test was negative.  BNP was elevated at 248.  She was placed on 4 L oxygen via nasal cannula.  He was also given IV Solu-Medrol, IV azithromycin and IV Rocephin in the ER.  1/27: Remains on 2 L supplemental oxygen.  -500 cc over interval.  Continues to endorse shortness of breath  1/28: Patient remains on 2 L nasal cannula.  Reports some mild improvement in respiratory status.  Diuresed effectively yesterday.  Net -1.4 L  1/29: Titrated to 1 L nasal cannula.  Respiratory status essentially stable interval.  Net -1.1 L over interval.     Assessment & Plan:   Principal Problem:   Multifocal pneumonia Active Problems:   Acute hypoxemic respiratory failure (HCC)   # Acute hypoxemic respiratory failure  --Suspected CHF exacerbation is more likely primary contributing factor --Pt presented with new 4L O2 requirement.   -CXR showed "Unchanged appearance of the chest with multifocal bilateral airspace opacities" and procal neg, so PNA less likely.   Ceftriaxone d/c'ed. --Treat both as below -- 1/28: Currently on 2 L.  Will attempt to wean.  Incentive spirometry to be encouraged.  If unable to wean from oxygen within the next 24 to 48 hours will prescribe home oxygen therapy - 1/29: Titrated to 1 L nasal cannula.  We will try to wean entirely from oxygen.  Encourage I-S.  Plan for discharge tomorrow 10/11/2019.  If unable to wean from oxygen at  that time will prescribe home oxygen  # COPD exacerbation  --continue suppl O2 with goal 88-92%, and wean as able --continue azithromycin for severe COPD exacerbation, 5 day course, end date 1/29 --continue steroid as prednisone 40 mg daily.  Start tapering tomorrow --DuoNeb q6h scheduled  # Possible diastolic CHF exacerbation --BNP 248, bilateral airspace opacities and crackles present on presentation.  Received IV lasix 40 mg with good response (Cr stable, good urine output, reduced O2 requirement). PLAN: --continue IV lasix 40 mg daily, monitor creatinine and watch for contraction alkalosis - 2g sodium diet - 2L fluid restriction - Goal net negative 1-1.5L daily - Weigh daily - Strict I/O  Mild hyperkalemia:  Discontinue potassium supplement.  Expected to improve with IV Lasix.  Elevated BUN/creatinine:  Probably due to underlying CKD stage IIIb.  Monitor renal function.  Insulin-dependent diabetes mellitus:  Continue Levemir 20u BID Add 3u meal-time TID SSI TID, no night-time coverage.  Hypertension:  Continue amlodipine and clonidine Continue lisinopril   Paroxysmal Atrial fibrillation:  No rate-control agent on home med list.  HR controlled. Continue Eliquis   DVT prophylaxis: [id number] Code Status: Full code  Family Communication: Spoke with daughter [name] via phone, [phone] on 1/28 Disposition Plan: discharge when weaned back to RA, or can discharge on 1-2L suppl O2 for short-term since pt does have qualifying dx for home O2.  [facility] rec home with [facility].  Pt lives alone.  Discussed home health with patient.  She was initially refusing because of plans to move with her daughter  to [facility]  I explained that home health is likely easier to set up then discontinue rather than having it when we needed.  She is in agreement.  Home health orders placed.  Social work aware.   Subjective and Interval History:  Seen and examined No acute events No new  complaints Diuresing effectively On 1 L nasal cannula   Objective: Vitals:   10/10/19 0203 10/10/19 0436 10/10/19 0743 10/10/19 0806  BP:  (!) 157/59  (!) 169/61  Pulse:  69  73  Resp:  20  16  Temp:  97.9 F (36.6 C)  99.7 F (37.6 C)  TempSrc:  Oral  Oral  SpO2:  94% 95% 94%  Weight: 85.8 kg     Height:        Intake/Output Summary (Last 24 hours) at 10/10/2019 1102 Last data filed at 10/09/2019 2107 Gross per 24 hour  Intake --  Output 1050 ml  Net -1050 ml   Filed Weights   10/07/19 0344 10/09/19 0440 10/10/19 0203  Weight: 88 kg 86.2 kg 85.8 kg    Examination:   Constitutional: NAD, AAOx3 HEENT: conjunctivae and lids normal, EOMI CV: RRR no M,R,G. Distal pulses +2.  No cyanosis.   RESP: lungs sounds more clear, no wheezes, normal respiratory effort, on 2L  GI: +BS, NTND Extremities: No effusions, edema, or tenderness in BLE SKIN: warm, dry and intact Neuro: II - XII grossly intact.  Sensation intact Psych: Normal mood and affect.  Appropriate judgement and reason   Data Reviewed: I have personally reviewed following labs and imaging studies  CBC: Recent Labs  Lab 10/05/19 1304 10/07/19 0629 10/08/19 0530 10/09/19 0722 10/10/19 0942  WBC 9.9 7.6 7.3 6.9 10.2  NEUTROABS 8.7*  --   --   --  8.0*  HGB 9.7* 8.6* 8.5* 8.8* 9.8*  HCT 33.1* 29.1* 28.1* 28.9* 32.3*  MCV 90.9 91.2 90.4 90.6 89.7  PLT 265 238 245 250 604   Basic Metabolic Panel: Recent Labs  Lab 10/05/19 1304 10/07/19 0629 10/08/19 0530 10/09/19 0616 10/10/19 0942  NA 141 142 142 144 143  K 5.2* 5.2* 4.5 4.6 4.2  CL 112* 112* 111 111 108  CO2 19* 25 25 23 25   GLUCOSE 212* 169* 182* 127* 101*  BUN 40* 44* 43* 43* 43*  CREATININE 1.46* 1.45* 1.26* 1.39* 1.40*  CALCIUM 8.9 8.6* 8.6* 8.6* 8.8*  MG  --  2.1 2.1 2.0 2.0   GFR: Estimated Creatinine Clearance: 30.8 mL/min (A) (by C-G formula based on SCr of 1.4 mg/dL (H)). Liver Function Tests: No results for input(s): AST, ALT,  ALKPHOS, BILITOT, PROT, ALBUMIN in the last 168 hours. No results for input(s): LIPASE, AMYLASE in the last 168 hours. No results for input(s): AMMONIA in the last 168 hours. Coagulation Profile: No results for input(s): INR, PROTIME in the last 168 hours. Cardiac Enzymes: No results for input(s): CKTOTAL, CKMB, CKMBINDEX, TROPONINI in the last 168 hours. BNP (last 3 results) No results for input(s): PROBNP in the last 8760 hours. HbA1C: No results for input(s): HGBA1C in the last 72 hours. CBG: Recent Labs  Lab 10/09/19 0804 10/09/19 1154 10/09/19 1628 10/09/19 2035 10/10/19 0732  GLUCAP 122* 180* 286* 374* 122*   Lipid Profile: No results for input(s): CHOL, HDL, LDLCALC, TRIG, CHOLHDL, LDLDIRECT in the last 72 hours. Thyroid Function Tests: No results for input(s): TSH, T4TOTAL, FREET4, T3FREE, THYROIDAB in the last 72 hours. Anemia Panel: No results for input(s): VITAMINB12, FOLATE, FERRITIN,  TIBC, IRON, RETICCTPCT in the last 72 hours. Sepsis Labs: Recent Labs  Lab 10/05/19 1332  PROCALCITON <0.10  LATICACIDVEN 1.2    Recent Results (from the past 240 hour(s))  Blood culture (routine x 2)     Status: Abnormal   Collection Time: 10/05/19  1:39 PM   Specimen: BLOOD  Result Value Ref Range Status   Specimen Description   Final    BLOOD LAC Performed at [facility], [address], [place], [place] [address]    Special Requests   Final    BOTTLES DRAWN AEROBIC AND ANAEROBIC Blood Culture adequate volume Performed at [facility], [address] [place], [place] [address]    Culture  Setup Time   Final    AEROBIC BOTTLE ONLY GRAM POSITIVE COCCI CRITICAL RESULT CALLED TO, READ BACK BY AND VERIFIED WITH: LISA KLUTZ AT 1406 ON 10/06/19 [place]. Performed at [facility], [address] [place], [place] [address]    Culture (A)  Final    STAPHYLOCOCCUS SPECIES (COAGULASE NEGATIVE) THE SIGNIFICANCE OF ISOLATING THIS ORGANISM FROM A  SINGLE SET OF BLOOD CULTURES WHEN MULTIPLE SETS ARE DRAWN IS UNCERTAIN. PLEASE NOTIFY THE MICROBIOLOGY DEPARTMENT WITHIN ONE WEEK IF SPECIATION AND SENSITIVITIES ARE REQUIRED. Performed at [facility], [place] [address] [place], [place] [address]    Report Status 10/08/2019 FINAL  Final  Blood culture (routine x 2)     Status: None   Collection Time: 10/05/19  2:37 PM   Specimen: BLOOD  Result Value Ref Range Status   Specimen Description BLOOD LEFT ANTECUBITAL  Final   Special Requests   Final    BOTTLES DRAWN AEROBIC AND ANAEROBIC Blood Culture adequate volume   Culture   Final    NO GROWTH 5 DAYS Performed at [facility], [address] [place], [place] [address]    Report Status 10/10/2019 FINAL  Final  Respiratory Panel by RT PCR (Flu A&B, Covid) - Nasopharyngeal Swab     Status: None   Collection Time: 10/05/19  2:37 PM   Specimen: Nasopharyngeal Swab  Result Value Ref Range Status   SARS Coronavirus 2 by RT PCR NEGATIVE NEGATIVE Final    Comment: (NOTE) SARS-CoV-2 target nucleic acids are NOT DETECTED. The SARS-CoV-2 RNA is generally detectable in upper respiratoy specimens during the acute phase of infection. The lowest concentration of SARS-CoV-2 viral copies this assay can detect is 131 copies/mL. A negative result does not preclude SARS-Cov-2 infection and should not be used as the sole basis for treatment or other patient management decisions. A negative result may occur with  improper specimen collection/handling, submission of specimen other than nasopharyngeal swab, presence of viral mutation(s) within the areas targeted by this assay, and inadequate number of viral copies (<131 copies/mL). A negative result must be combined with clinical observations, patient history, and epidemiological information. The expected result is Negative. Fact Sheet for Patients:  PinkCheek.be Fact Sheet for Healthcare Providers:   GravelBags.it This test is not yet ap proved or cleared by the [place] FDA and  has been authorized for detection and/or diagnosis of SARS-CoV-2 by FDA under an Emergency Use Authorization (EUA). This EUA will remain  in effect (meaning this test can be used) for the duration of the COVID-19 declaration under Section 564(b)(1) of the Act, 21 U.S.C. section 360bbb-3(b)(1), unless the authorization is terminated or revoked sooner.    Influenza A by PCR NEGATIVE NEGATIVE Final   Influenza B by PCR NEGATIVE  NEGATIVE Final    Comment: (NOTE) The Xpert Xpress SARS-CoV-2/FLU/RSV assay is intended as an aid in  the diagnosis of influenza from Nasopharyngeal swab specimens and  should not be used as a sole basis for treatment. Nasal washings and  aspirates are unacceptable for Xpert Xpress SARS-CoV-2/FLU/RSV  testing. Fact Sheet for Patients: PinkCheek.be Fact Sheet for Healthcare Providers: GravelBags.it This test is not yet approved or cleared by the [place] FDA and  has been authorized for detection and/or diagnosis of SARS-CoV-2 by  FDA under an Emergency Use Authorization (EUA). This EUA will remain  in effect (meaning this test can be used) for the duration of the  Covid-19 declaration under Section 564(b)(1) of the Act, 21  U.S.C. section 360bbb-3(b)(1), unless the authorization is  terminated or revoked. Performed at [facility], [address], [place], [place] [address]       Radiology Studies: No results found.   Scheduled Meds: . amLODipine  5 mg Oral Daily  . apixaban  2.5 mg Oral BID  . atorvastatin  80 mg Oral Daily  . cloNIDine  0.1 mg Oral BID  . furosemide  40 mg Intravenous Daily  . insulin aspart  0-9 Units Subcutaneous TID AC & HS  . insulin aspart  3 Units Subcutaneous TID WC  . insulin detemir  20 Units Subcutaneous BID AC  . ipratropium-albuterol   3 mL Nebulization TID  . lisinopril  10 mg Oral Daily  . pantoprazole  40 mg Oral QHS  . predniSONE  40 mg Oral Q breakfast  . senna-docusate  2 tablet Oral BID  . sodium chloride flush  3 mL Intravenous Q12H   Continuous Infusions: . sodium chloride Stopped (10/06/19 1607)  . azithromycin 500 mg (10/09/19 1345)     LOS: 5 days   Time spent: 35 minutes   Sidney Ace, MD [facility] If 7PM-7AM, please contact night-coverage 10/10/2019, 11:02 AM

## 2019-10-10 NOTE — Care Management Important Message (Signed)
Important Message  Patient Details  Name: Dorothy [name] MRN: [id number] Date of Birth: 1934-12-11   Medicare Important Message Given:  Yes     Dannette Barbara 10/10/2019, 11:17 AM

## 2019-10-11 LAB — GLUCOSE, CAPILLARY
Glucose-Capillary: 133 mg/dL — ABNORMAL HIGH (ref 70–99)
Glucose-Capillary: 142 mg/dL — ABNORMAL HIGH (ref 70–99)
Glucose-Capillary: 167 mg/dL — ABNORMAL HIGH (ref 70–99)

## 2019-10-11 MED ORDER — IPRATROPIUM-ALBUTEROL 0.5-2.5 (3) MG/3ML IN SOLN: 3.0000 mL | Freq: Three times a day (TID) | RESPIRATORY_TRACT | 3 refills | Status: DC

## 2019-10-11 MED ORDER — IPRATROPIUM-ALBUTEROL 0.5-2.5 (3) MG/3ML IN SOLN: 3.0000 mL | Freq: Three times a day (TID) | RESPIRATORY_TRACT | 3 refills | Status: AC

## 2019-10-11 MED ORDER — PREDNISONE 20 MG PO TABS
20.0000 mg | ORAL_TABLET | Freq: Once | ORAL | Status: AC
  Administered 2019-10-11: 18:00:00 20 mg via ORAL
  Filled 2019-10-11: qty 1

## 2019-10-11 MED ORDER — METHYLPREDNISOLONE 4 MG PO TABS: ORAL_TABLET | ORAL | 0 refills | Status: DC

## 2019-10-11 MED ORDER — ALBUTEROL SULFATE HFA 108 (90 BASE) MCG/ACT IN AERS: 2.0000 | INHALATION_SPRAY | Freq: Four times a day (QID) | RESPIRATORY_TRACT | 3 refills | Status: DC | PRN

## 2019-10-11 MED ORDER — ALBUTEROL SULFATE HFA 108 (90 BASE) MCG/ACT IN AERS: 2.0000 | INHALATION_SPRAY | Freq: Four times a day (QID) | RESPIRATORY_TRACT | 3 refills | Status: AC | PRN

## 2019-10-11 NOTE — Discharge Summary (Signed)
Physician Discharge Summary  Dorothy [name] PFX:[id number] DOB: 03/02/1935 DOA: 10/05/2019  PCP: Letta Median, MD  Admit date: 10/05/2019 Discharge date: 10/11/2019  Admitted From: Home Disposition: Home with home health  Recommendations for Outpatient Follow-up:  1. Follow up with PCP in 1-2 weeks   Home Health: Yes Equipment/Devices: Oxygen 2 L Discharge Condition: Stable CODE STATUS: Full Diet recommendation: Heart Healthy / Carb Modified  Brief/Interim Summary:  Dorothy [name] an 85 y.o.Caucasian femalewith medical history significant for chronic diastolic CHF, COPD, diabetes mellitus, CAD, hypertension, hyperlipidemia, obstructive sleep apnea, arthritis, anemia, presented to the hospital because of shortness of breath.  The patient was tachypneic, hypertensive and hypoxemic in the emergency room. Oxygen saturation was 78% on room air. Procalcitonin and lactic acid levels were normal. Covid test was negative. BNP was elevated at 248. She was placed on 4 L oxygen via nasal cannula. He was also given IV Solu-Medrol, IV azithromycin and IV Rocephin in the ER.  1/27: Remains on 2 L supplemental oxygen.  -500 cc over interval.  Continues to endorse shortness of breath  1/28: Patient remains on 2 L nasal cannula.  Reports some mild improvement in respiratory status.  Diuresed effectively yesterday.  Net -1.4 L  1/29: Titrated to 1 L nasal cannula.  Respiratory status essentially stable interval.  Net -1.1 L over interval.  1/30: Titrated to 1 to 1-1/2 L nasal cannula.  Respiratory status stable over interval.  Continues to be net negative.  Breathing appears at or near baseline.  Home health established.  Home oxygen established.  Appreciate diligent work by case management team.  Patient medically stable for discharge home today.  Discussed with patient's daughter via phone.   Discharge Diagnoses:  Principal Problem:   Multifocal pneumonia Active Problems:    Acute hypoxemic respiratory failure ([place])   # Acute hypoxemic respiratory failure  --Suspected CHF exacerbation is more likely primary contributing factor --Pt presented with new 4L O2 requirement.   -CXR showed "Unchanged appearance of the chest with multifocal bilateral airspace opacities" and procal neg, so PNA less likely.   Ceftriaxone d/c'ed. --Treat both as below -- 1/28: Currently on 2 L.  Will attempt to wean.  Incentive spirometry to be encouraged.  If unable to wean from oxygen within the next 24 to 48 hours will prescribe home oxygen therapy - 1/29: Titrated to 1 L nasal cannula.  We will try to wean entirely from oxygen.  Encourage I-S.  Plan for discharge tomorrow 10/11/2019.  If unable to wean from oxygen at that time will prescribe home oxygen --Hypoxemia nearly resolved.  Titrated to 1-2 L.  Requiring 2 L on minimal exertion.  Ambulated yesterday.  Meets criteria for home oxygen.  Social work aware.  Home oxygen will be delivered to bedside.  # COPD exacerbation  --continue suppl O2 with goal 88-92%, and wean as able --azithromycin for severe COPD exacerbation, 5 day course, end date 1/29 --continue steroid as prednisone 40 mg daily.    Steroid taper prescribed on discharge --DuoNeb q6h scheduled in house --Follow-up outpatient with pulmonary for PCP  # Possible diastolic CHF exacerbation --BNP 248, bilateral airspace opacities and crackles present on presentation.  Received IV lasix 40 mg with good response (Cr stable, good urine output, reduced O2 requirement). Diuresed effectively in house Patient dry weight at time of discharge Resume home regimen Check weights at home Follow-up outpatient  Mild hyperkalemia:  Discontinue potassium supplement. Expected to improve with IV Lasix.  Elevated BUN/creatinine:  Probably due to underlying CKD stage IIIb.  Monitor renal function. Creatinine at or near baseline at time of discharge  Insulin-dependent diabetes  mellitus:  Continue Levemir 20u BID Add 3u meal-time TID SSI TID, no night-time coverage. Can resume home regimen on discharge  Hypertension:  Continue amlodipine and clonidine Continue lisinopril   Paroxysmal Atrial fibrillation:  No rate-control agent on home med list.  HR controlled. Continue Eliquis  Discharge Instructions  Discharge Instructions    Diet - low sodium heart healthy   Complete by: As directed    Diet - low sodium heart healthy   Complete by: As directed    Increase activity slowly   Complete by: As directed    Increase activity slowly   Complete by: As directed      Allergies as of 10/11/2019      Reactions   Tramadol Anaphylaxis   Aspirin    "Stomach problems"      Medication List    TAKE these medications   acetaminophen 500 MG tablet Commonly known as: TYLENOL Take 1,000 mg by mouth every 6 (six) hours as needed for moderate pain or headache.   albuterol 108 (90 Base) MCG/ACT inhaler Commonly known as: VENTOLIN HFA Inhale 2 puffs into the lungs every 6 (six) hours as needed for wheezing or shortness of breath.   alendronate 70 MG tablet Commonly known as: FOSAMAX Take 70 mg by mouth once a week.   amLODipine 5 MG tablet Commonly known as: NORVASC Take 5 mg by mouth daily.   atorvastatin 80 MG tablet Commonly known as: LIPITOR Take 80 mg by mouth daily.   CALCIUM 600 + D PO Take 1 tablet by mouth daily.   cloNIDine 0.1 MG tablet Commonly known as: CATAPRES Take 0.1 mg by mouth 2 (two) times daily.   cyanocobalamin 1000 MCG/ML injection Commonly known as: (VITAMIN B-12) Inject 1,000 mcg into the muscle every 30 (thirty) days.   Eliquis 2.5 MG Tabs tablet Generic drug: apixaban Take 2.5 mg by mouth 2 (two) times daily.   esomeprazole 20 MG capsule Commonly known as: NEXIUM Take 20 mg by mouth at bedtime.   ferrous sulfate 325 (65 FE) MG tablet Take 325 mg by mouth daily with breakfast.   furosemide 20 MG  tablet Commonly known as: LASIX Take 20 mg by mouth daily.   insulin NPH Human 100 UNIT/ML injection Commonly known as: NOVOLIN N Inject 15-20 Units into the skin See admin instructions. Inject 20u under the skin every morning and inject 15u under the skin ever evening   lisinopril 10 MG tablet Commonly known as: ZESTRIL Take 10 mg by mouth daily.   metFORMIN 1000 MG tablet Commonly known as: GLUCOPHAGE Take 1,000 mg by mouth 2 (two) times daily.   methylPREDNISolone 4 MG tablet Commonly known as: Medrol Methylprednisolone taper: Day 1: take 20mg  (5 tabs) Day 2: Take 16mg  (4 tabs) Day 3: Take 12mg  (3 tabs) Day 4: Take 8mg  (2 tabs) Day 5: Take 4mg  (1 tab)   potassium chloride 10 MEQ tablet Commonly known as: KLOR-CON Take 10 mEq by mouth daily.            Durable Medical Equipment  (From admission, onward)         Start     Ordered   10/09/19 1136  For home use only DME oxygen  Once    Question Answer Comment  Length of Need 6 Months   Mode or (Route) Nasal cannula   Liters  per Minute 2   Oxygen conserving device Yes   Oxygen delivery system Gas      10/09/19 1135          Allergies  Allergen Reactions  . Tramadol Anaphylaxis  . Aspirin     "Stomach problems"    Consultations:  None   Procedures/Studies: DG Chest Port 1 View  Result Date: 10/06/2019 CLINICAL DATA:  Pneumonia EXAM: PORTABLE CHEST 1 VIEW COMPARISON:  10/05/2019 FINDINGS: Mild cardiomegaly, unchanged. Multifocal bilateral airspace opacities, also unchanged. No pneumothorax or sizable pleural effusion. Chronic interstitial opacities. IMPRESSION: Unchanged appearance of the chest with multifocal bilateral airspace opacities. Electronically Signed   By: Ulyses Jarred M.D.   On: 10/06/2019 03:25   DG Chest Port 1 View  Result Date: 10/05/2019 CLINICAL DATA:  Shortness of breath, worsening. EXAM: PORTABLE CHEST 1 VIEW COMPARISON:  Chest x-ray dated 12/21/2016. FINDINGS: Stable cardiomegaly.  Patchy bilateral airspace opacities, LEFT greater than RIGHT, suspected pneumonia superimposed on chronic interstitial lung disease. Probable small LEFT pleural effusion. Osseous structures about the chest are unremarkable. IMPRESSION: 1. Suspected multifocal/bilateral pneumonia superimposed on chronic interstitial lung disease. 2. Probable small LEFT pleural effusion. 3. Stable cardiomegaly. Electronically Signed   By: Franki Cabot M.D.   On: 10/05/2019 13:36    (Echo, Carotid, EGD, Colonoscopy, ERCP)    Subjective: Seen and examined on the day of discharge No complaints Medically stable for discharge home Discussed discharge plan with daughter over phone, all questions answered  Discharge Exam: Vitals:   10/11/19 0814 10/11/19 1313  BP: (!) 172/70   Pulse: 85   Resp: 18   Temp: 98 F (36.7 C)   SpO2: 93% 97%   Vitals:   10/11/19 0434 10/11/19 0736 10/11/19 0814 10/11/19 1313  BP: (!) 157/73  (!) 172/70   Pulse: 76  85   Resp: 20  18   Temp: 98.3 F (36.8 C)  98 F (36.7 C)   TempSrc: Oral  Oral   SpO2: 98% 90% 93% 97%  Weight: 84.8 kg     Height:        General: Pt is alert, awake, not in acute distress Cardiovascular: RRR, S1/S2 +, no rubs, no gallops Respiratory: Diminished lung sounds otherwise clear.  Normal work of breathing Abdominal: Soft, NT, ND, bowel sounds + Extremities: no edema, no cyanosis    The results of significant diagnostics from this hospitalization (including imaging, microbiology, ancillary and laboratory) are listed below for reference.     Microbiology: Recent Results (from the past 240 hour(s))  Blood culture (routine x 2)     Status: Abnormal   Collection Time: 10/05/19  1:39 PM   Specimen: BLOOD  Result Value Ref Range Status   Specimen Description   Final    BLOOD LAC Performed at [facility], [address], [place], [place] [address]    Special Requests   Final    BOTTLES DRAWN AEROBIC AND ANAEROBIC Blood Culture  adequate volume Performed at [facility], [address] [place], [place] [address]    Culture  Setup Time   Final    AEROBIC BOTTLE ONLY GRAM POSITIVE COCCI CRITICAL RESULT CALLED TO, READ BACK BY AND VERIFIED WITH: LISA KLUTZ AT 1406 ON 10/06/19 [place]. Performed at [facility], [place], [place] [address]    Culture (A)  Final    STAPHYLOCOCCUS SPECIES (COAGULASE NEGATIVE) THE SIGNIFICANCE OF ISOLATING THIS ORGANISM FROM A SINGLE SET OF BLOOD CULTURES WHEN MULTIPLE SETS  ARE DRAWN IS UNCERTAIN. PLEASE NOTIFY THE MICROBIOLOGY DEPARTMENT WITHIN ONE WEEK IF SPECIATION AND SENSITIVITIES ARE REQUIRED. Performed at [facility], [place] [address], [place], [place] [address]    Report Status 10/08/2019 FINAL  Final  Blood culture (routine x 2)     Status: None   Collection Time: 10/05/19  2:37 PM   Specimen: BLOOD  Result Value Ref Range Status   Specimen Description BLOOD LEFT ANTECUBITAL  Final   Special Requests   Final    BOTTLES DRAWN AEROBIC AND ANAEROBIC Blood Culture adequate volume   Culture   Final    NO GROWTH 5 DAYS Performed at [facility], [address] [place], [place] [address]    Report Status 10/10/2019 FINAL  Final  Respiratory Panel by RT PCR (Flu A&B, Covid) - Nasopharyngeal Swab     Status: None   Collection Time: 10/05/19  2:37 PM   Specimen: Nasopharyngeal Swab  Result Value Ref Range Status   SARS Coronavirus 2 by RT PCR NEGATIVE NEGATIVE Final    Comment: (NOTE) SARS-CoV-2 target nucleic acids are NOT DETECTED. The SARS-CoV-2 RNA is generally detectable in upper respiratoy specimens during the acute phase of infection. The lowest concentration of SARS-CoV-2 viral copies this assay can detect is 131 copies/mL. A negative result does not preclude SARS-Cov-2 infection and should not be used as the sole basis for treatment or other patient management decisions. A negative result may occur with  improper  specimen collection/handling, submission of specimen other than nasopharyngeal swab, presence of viral mutation(s) within the areas targeted by this assay, and inadequate number of viral copies (<131 copies/mL). A negative result must be combined with clinical observations, patient history, and epidemiological information. The expected result is Negative. Fact Sheet for Patients:  PinkCheek.be Fact Sheet for Healthcare Providers:  GravelBags.it This test is not yet ap proved or cleared by the [place] FDA and  has been authorized for detection and/or diagnosis of SARS-CoV-2 by FDA under an Emergency Use Authorization (EUA). This EUA will remain  in effect (meaning this test can be used) for the duration of the COVID-19 declaration under Section 564(b)(1) of the Act, 21 U.S.C. section 360bbb-3(b)(1), unless the authorization is terminated or revoked sooner.    Influenza A by PCR NEGATIVE NEGATIVE Final   Influenza B by PCR NEGATIVE NEGATIVE Final    Comment: (NOTE) The Xpert Xpress SARS-CoV-2/FLU/RSV assay is intended as an aid in  the diagnosis of influenza from Nasopharyngeal swab specimens and  should not be used as a sole basis for treatment. Nasal washings and  aspirates are unacceptable for Xpert Xpress SARS-CoV-2/FLU/RSV  testing. Fact Sheet for Patients: PinkCheek.be Fact Sheet for Healthcare Providers: GravelBags.it This test is not yet approved or cleared by the [place] FDA and  has been authorized for detection and/or diagnosis of SARS-CoV-2 by  FDA under an Emergency Use Authorization (EUA). This EUA will remain  in effect (meaning this test can be used) for the duration of the  Covid-19 declaration under Section 564(b)(1) of the Act, 21  U.S.C. section 360bbb-3(b)(1), unless the authorization is  terminated or revoked. Performed at [facility], [address] [place], [place] [address]      Labs: BNP (last 3 results) Recent Labs    10/05/19 1332  BNP 778.2*   Basic Metabolic Panel: Recent Labs  Lab 10/05/19 1304 10/07/19 0629 10/08/19 0530 10/09/19 0616 10/10/19 0942  NA 141 142 142 144 143  K  5.2* 5.2* 4.5 4.6 4.2  CL 112* 112* 111 111 108  CO2 19* 25 25 23 25   GLUCOSE 212* 169* 182* 127* 101*  BUN 40* 44* 43* 43* 43*  CREATININE 1.46* 1.45* 1.26* 1.39* 1.40*  CALCIUM 8.9 8.6* 8.6* 8.6* 8.8*  MG  --  2.1 2.1 2.0 2.0   Liver Function Tests: No results for input(s): AST, ALT, ALKPHOS, BILITOT, PROT, ALBUMIN in the last 168 hours. No results for input(s): LIPASE, AMYLASE in the last 168 hours. No results for input(s): AMMONIA in the last 168 hours. CBC: Recent Labs  Lab 10/05/19 1304 10/07/19 0629 10/08/19 0530 10/09/19 0722 10/10/19 0942  WBC 9.9 7.6 7.3 6.9 10.2  NEUTROABS 8.7*  --   --   --  8.0*  HGB 9.7* 8.6* 8.5* 8.8* 9.8*  HCT 33.1* 29.1* 28.1* 28.9* 32.3*  MCV 90.9 91.2 90.4 90.6 89.7  PLT 265 238 245 250 262   Cardiac Enzymes: No results for input(s): CKTOTAL, CKMB, CKMBINDEX, TROPONINI in the last 168 hours. BNP: Invalid input(s): POCBNP CBG: Recent Labs  Lab 10/10/19 1326 10/10/19 1630 10/10/19 2046 10/11/19 0810 10/11/19 1200  GLUCAP 109* 241* 356* 133* 142*   D-Dimer No results for input(s): DDIMER in the last 72 hours. Hgb A1c No results for input(s): HGBA1C in the last 72 hours. Lipid Profile No results for input(s): CHOL, HDL, LDLCALC, TRIG, CHOLHDL, LDLDIRECT in the last 72 hours. Thyroid function studies No results for input(s): TSH, T4TOTAL, T3FREE, THYROIDAB in the last 72 hours.  Invalid input(s): FREET3 Anemia work up No results for input(s): VITAMINB12, FOLATE, FERRITIN, TIBC, IRON, RETICCTPCT in the last 72 hours. Urinalysis    Component Value Date/Time   COLORURINE Yellow 01/10/2012 0527   APPEARANCEUR Hazy 01/10/2012 0527   LABSPEC 1.012  01/10/2012 0527   PHURINE 5.0 01/10/2012 0527   GLUCOSEU 150 mg/dL 01/10/2012 0527   HGBUR Negative 01/10/2012 0527   BILIRUBINUR Negative 01/10/2012 0527   KETONESUR Negative 01/10/2012 0527   PROTEINUR Negative 01/10/2012 0527   NITRITE Positive 01/10/2012 0527   LEUKOCYTESUR Negative 01/10/2012 0527   Sepsis Labs Invalid input(s): PROCALCITONIN,  WBC,  LACTICIDVEN Microbiology Recent Results (from the past 240 hour(s))  Blood culture (routine x 2)     Status: Abnormal   Collection Time: 10/05/19  1:39 PM   Specimen: BLOOD  Result Value Ref Range Status   Specimen Description   Final    BLOOD LAC Performed at [facility], [address], [place], [place] [address]    Special Requests   Final    BOTTLES DRAWN AEROBIC AND ANAEROBIC Blood Culture adequate volume Performed at [facility], [address] [place], [place] [address]    Culture  Setup Time   Final    AEROBIC BOTTLE ONLY GRAM POSITIVE COCCI CRITICAL RESULT CALLED TO, READ BACK BY AND VERIFIED WITH: LISA KLUTZ AT 1406 ON 10/06/19 [place]. Performed at [facility], [address] [place], [place] [address]    Culture (A)  Final    STAPHYLOCOCCUS SPECIES (COAGULASE NEGATIVE) THE SIGNIFICANCE OF ISOLATING THIS ORGANISM FROM A SINGLE SET OF BLOOD CULTURES WHEN MULTIPLE SETS ARE DRAWN IS UNCERTAIN. PLEASE NOTIFY THE MICROBIOLOGY DEPARTMENT WITHIN ONE WEEK IF SPECIATION AND SENSITIVITIES ARE REQUIRED. Performed at [facility], [place] [address], [place], [place] [address]    Report Status 10/08/2019 FINAL  Final  Blood culture (routine x 2)     Status: None   Collection Time: 10/05/19  2:37  PM   Specimen: BLOOD  Result Value Ref Range Status   Specimen Description BLOOD LEFT ANTECUBITAL  Final   Special Requests   Final    BOTTLES DRAWN AEROBIC AND ANAEROBIC Blood Culture adequate volume   Culture   Final    NO GROWTH 5 DAYS Performed at [facility], [address]  [place], [place] [address]    Report Status 10/10/2019 FINAL  Final  Respiratory Panel by RT PCR (Flu A&B, Covid) - Nasopharyngeal Swab     Status: None   Collection Time: 10/05/19  2:37 PM   Specimen: Nasopharyngeal Swab  Result Value Ref Range Status   SARS Coronavirus 2 by RT PCR NEGATIVE NEGATIVE Final    Comment: (NOTE) SARS-CoV-2 target nucleic acids are NOT DETECTED. The SARS-CoV-2 RNA is generally detectable in upper respiratoy specimens during the acute phase of infection. The lowest concentration of SARS-CoV-2 viral copies this assay can detect is 131 copies/mL. A negative result does not preclude SARS-Cov-2 infection and should not be used as the sole basis for treatment or other patient management decisions. A negative result may occur with  improper specimen collection/handling, submission of specimen other than nasopharyngeal swab, presence of viral mutation(s) within the areas targeted by this assay, and inadequate number of viral copies (<131 copies/mL). A negative result must be combined with clinical observations, patient history, and epidemiological information. The expected result is Negative. Fact Sheet for Patients:  PinkCheek.be Fact Sheet for Healthcare Providers:  GravelBags.it This test is not yet ap proved or cleared by the [place] FDA and  has been authorized for detection and/or diagnosis of SARS-CoV-2 by FDA under an Emergency Use Authorization (EUA). This EUA will remain  in effect (meaning this test can be used) for the duration of the COVID-19 declaration under Section 564(b)(1) of the Act, 21 U.S.C. section 360bbb-3(b)(1), unless the authorization is terminated or revoked sooner.    Influenza A by PCR NEGATIVE NEGATIVE Final   Influenza B by PCR NEGATIVE NEGATIVE Final    Comment: (NOTE) The Xpert Xpress SARS-CoV-2/FLU/RSV assay is intended as an aid in  the diagnosis of influenza from  Nasopharyngeal swab specimens and  should not be used as a sole basis for treatment. Nasal washings and  aspirates are unacceptable for Xpert Xpress SARS-CoV-2/FLU/RSV  testing. Fact Sheet for Patients: PinkCheek.be Fact Sheet for Healthcare Providers: GravelBags.it This test is not yet approved or cleared by the [place] FDA and  has been authorized for detection and/or diagnosis of SARS-CoV-2 by  FDA under an Emergency Use Authorization (EUA). This EUA will remain  in effect (meaning this test can be used) for the duration of the  Covid-19 declaration under Section 564(b)(1) of the Act, 21  U.S.C. section 360bbb-3(b)(1), unless the authorization is  terminated or revoked. Performed at [facility], [address] [place], [place] [address]      Time coordinating discharge: Over 30 minutes  SIGNED:   Sidney Ace, MD  [facility] 10/11/2019, 1:47 PM Pager  If 7PM-7AM, please contact night-coverage

## 2019-10-11 NOTE — Plan of Care (Signed)
  Problem: Activity: Goal: Ability to tolerate increased activity will improve Outcome: Progressing   Problem: Clinical Measurements: Goal: Ability to maintain a body temperature in the normal range will improve Outcome: Progressing   Problem: Respiratory: Goal: Ability to maintain adequate ventilation will improve Outcome: Progressing Goal: Ability to maintain a clear airway will improve Outcome: Progressing   Problem: Education: Goal: Knowledge of General Education information will improve Description: Including pain rating scale, medication(s)/side effects and non-pharmacologic comfort measures Outcome: Progressing   Problem: Health Behavior/Discharge Planning: Goal: Ability to manage health-related needs will improve Outcome: Progressing   

## 2019-10-11 NOTE — TOC Transition Note (Signed)
Transition of Care [facility]) - CM/SW Discharge Note   Patient Details  Name: Dorothy [name] MRN: [id number] Date of Birth: Aug 09, 1935  Transition of Care [facility]) CM/SW Contact:  Trecia Rogers, LCSW Phone Number: 10/11/2019, 10:54 AM   Clinical Narrative:     Patient will be discharging home. Patient was going to go live with her daughter in [place], [place] but is now going to go back to her own home. [place] will be providing RN, PT and nurse aid services and [employer] will be providing DME services. CSW contacted pt's daughter, [name], who stated that she will come get her along with her daughter, [name]. [name] stated that she has to leave to go back to [facility] on Tuesday, so she hopes that [facility] will contact and come out before then.  No other needs.   Final next level of care: [place] Barriers to Discharge: No Barriers Identified   Patient Goals and CMS Choice Patient states their goals for this hospitalization and ongoing recovery are:: To be able to get back to taking care of myself independently. CMS Medicare.gov Compare Post Acute Care list provided to:: Patient Choice offered to / list presented to : Patient  Discharge Placement                       Discharge Plan and Services   Discharge Planning Services: CM Consult            DME Arranged: Oxygen DME Agency: [employer] Date DME Agency Contacted: 10/09/19 Time DME Agency Contacted: 1200 Representative spoke with at DME Agency: [place]: RN, PT, Nurse's Aide [place] Agency: [place] ([place]) Date [place]: 10/11/19 Time [place]: 1053 Representative spoke with at [place]: [place] ([place]) Interventions     Readmission Risk Interventions No flowsheet data found.

## 2020-01-01 ENCOUNTER — Other Ambulatory Visit: Payer: Self-pay

## 2020-01-01 ENCOUNTER — Inpatient Hospital Stay: Payer: Medicare Other | Attending: Oncology

## 2020-01-01 DIAGNOSIS — D509 Iron deficiency anemia, unspecified: Secondary | ICD-10-CM | POA: Insufficient documentation

## 2020-01-01 DIAGNOSIS — E538 Deficiency of other specified B group vitamins: Secondary | ICD-10-CM

## 2020-01-01 LAB — CBC WITH DIFFERENTIAL/PLATELET
Abs Immature Granulocytes: 0.01 10*3/uL (ref 0.00–0.07)
Basophils Absolute: 0 10*3/uL (ref 0.0–0.1)
Basophils Relative: 0 %
Eosinophils Absolute: 0.1 10*3/uL (ref 0.0–0.5)
Eosinophils Relative: 1 %
HCT: 31.9 % — ABNORMAL LOW (ref 36.0–46.0)
Hemoglobin: 9.6 g/dL — ABNORMAL LOW (ref 12.0–15.0)
Immature Granulocytes: 0 %
Lymphocytes Relative: 9 %
Lymphs Abs: 0.6 10*3/uL — ABNORMAL LOW (ref 0.7–4.0)
MCH: 26 pg (ref 26.0–34.0)
MCHC: 30.1 g/dL (ref 30.0–36.0)
MCV: 86.4 fL (ref 80.0–100.0)
Monocytes Absolute: 0.4 10*3/uL (ref 0.1–1.0)
Monocytes Relative: 6 %
Neutro Abs: 5.5 10*3/uL (ref 1.7–7.7)
Neutrophils Relative %: 84 %
Platelets: 201 10*3/uL (ref 150–400)
RBC: 3.69 MIL/uL — ABNORMAL LOW (ref 3.87–5.11)
RDW: 14.6 % (ref 11.5–15.5)
WBC: 6.6 10*3/uL (ref 4.0–10.5)
nRBC: 0 % (ref 0.0–0.2)

## 2020-01-01 LAB — FERRITIN: Ferritin: 19 ng/mL (ref 11–307)

## 2020-01-01 LAB — IRON AND TIBC
Iron: 28 ug/dL (ref 28–170)
Saturation Ratios: 7 % — ABNORMAL LOW (ref 10.4–31.8)
TIBC: 381 ug/dL (ref 250–450)
UIBC: 353 ug/dL

## 2020-01-02 NOTE — Progress Notes (Signed)
I called pt. House and got voicemail and left message that her iron levels are low-ferritin is 19. Dr. Is recommending iron treatments. If she could call us and let us know what she wants to do. Left my direct number to call

## 2020-01-07 ENCOUNTER — Telehealth: Payer: Self-pay | Admitting: *Deleted

## 2020-01-07 NOTE — Telephone Encounter (Signed)
Pt called back and said she would like iron tx. And it has to be on fridays when her granddaughter is off. I made her appt 5/30 at 2 pm. She will have granddaughter to call me if the appt is not a good day for them

## 2020-01-07 NOTE — Telephone Encounter (Signed)
-----   Message from Sindy Guadel[place], MD sent at 01/02/2020  7:47 AM EDT ----- She has evidence of iron deficiency again. Would she like to come for IV iron?

## 2020-01-08 ENCOUNTER — Telehealth: Payer: Self-pay | Admitting: *Deleted

## 2020-01-08 ENCOUNTER — Other Ambulatory Visit: Payer: Self-pay | Admitting: Oncology

## 2020-01-08 NOTE — Telephone Encounter (Signed)
[place] daughter called and said that she can bring her tom. And I she would like it in am due to pt's breathing when she is out in the heat. She will need 5 doses of iron all together and it will be on Friday only and she would like 11:30 appt if poss. And if not make it 1:30. The will get theappt for future when they come tom.

## 2020-01-09 ENCOUNTER — Inpatient Hospital Stay: Payer: Medicare Other

## 2020-01-09 ENCOUNTER — Other Ambulatory Visit: Payer: Self-pay

## 2020-01-09 VITALS — BP 131/60 | HR 59 | Temp 97.6°F | Resp 16

## 2020-01-09 DIAGNOSIS — D509 Iron deficiency anemia, unspecified: Secondary | ICD-10-CM | POA: Diagnosis not present

## 2020-01-09 MED ORDER — SODIUM CHLORIDE 0.9 % IV SOLN
Freq: Once | INTRAVENOUS | Status: AC
  Filled 2020-01-09: qty 250

## 2020-01-09 MED ORDER — IRON SUCROSE 20 MG/ML IV SOLN
200.0000 mg | Freq: Once | INTRAVENOUS | Status: AC
  Administered 2020-01-09: 200 mg via INTRAVENOUS
  Filled 2020-01-09: qty 10

## 2020-01-15 ENCOUNTER — Emergency Department: Payer: Medicare Other

## 2020-01-15 ENCOUNTER — Inpatient Hospital Stay
Admission: EM | Admit: 2020-01-15 | Discharge: 2020-01-23 | DRG: 871 | Disposition: A | Discharge status: Skilled Nursing Facility | Payer: Medicare Other | Attending: Internal Medicine | Admitting: Internal Medicine

## 2020-01-15 ENCOUNTER — Telehealth: Payer: Self-pay

## 2020-01-15 ENCOUNTER — Other Ambulatory Visit: Payer: Self-pay

## 2020-01-15 ENCOUNTER — Encounter: Payer: Self-pay | Admitting: Emergency Medicine

## 2020-01-15 DIAGNOSIS — R627 Adult failure to thrive: Secondary | ICD-10-CM | POA: Diagnosis present

## 2020-01-15 DIAGNOSIS — R680 Hypothermia, not associated with low environmental temperature: Secondary | ICD-10-CM | POA: Diagnosis present

## 2020-01-15 DIAGNOSIS — Z833 Family history of diabetes mellitus: Secondary | ICD-10-CM

## 2020-01-15 DIAGNOSIS — D696 Thrombocytopenia, unspecified: Secondary | ICD-10-CM | POA: Diagnosis present

## 2020-01-15 DIAGNOSIS — D631 Anemia in chronic kidney disease: Secondary | ICD-10-CM | POA: Diagnosis present

## 2020-01-15 DIAGNOSIS — R34 Anuria and oliguria: Secondary | ICD-10-CM | POA: Diagnosis present

## 2020-01-15 DIAGNOSIS — J189 Pneumonia, unspecified organism: Secondary | ICD-10-CM | POA: Diagnosis present

## 2020-01-15 DIAGNOSIS — Z515 Encounter for palliative care: Secondary | ICD-10-CM | POA: Diagnosis not present

## 2020-01-15 DIAGNOSIS — Z66 Do not resuscitate: Secondary | ICD-10-CM | POA: Diagnosis not present

## 2020-01-15 DIAGNOSIS — J440 Chronic obstructive pulmonary disease with acute lower respiratory infection: Secondary | ICD-10-CM | POA: Diagnosis present

## 2020-01-15 DIAGNOSIS — N189 Chronic kidney disease, unspecified: Secondary | ICD-10-CM

## 2020-01-15 DIAGNOSIS — N1832 Chronic kidney disease, stage 3b: Secondary | ICD-10-CM | POA: Diagnosis not present

## 2020-01-15 DIAGNOSIS — Z7189 Other specified counseling: Secondary | ICD-10-CM | POA: Diagnosis not present

## 2020-01-15 DIAGNOSIS — N179 Acute kidney failure, unspecified: Secondary | ICD-10-CM

## 2020-01-15 DIAGNOSIS — N186 End stage renal disease: Secondary | ICD-10-CM | POA: Diagnosis present

## 2020-01-15 DIAGNOSIS — N170 Acute kidney failure with tubular necrosis: Secondary | ICD-10-CM | POA: Diagnosis not present

## 2020-01-15 DIAGNOSIS — I5033 Acute on chronic diastolic (congestive) heart failure: Secondary | ICD-10-CM | POA: Diagnosis present

## 2020-01-15 DIAGNOSIS — R54 Age-related physical debility: Secondary | ICD-10-CM | POA: Diagnosis present

## 2020-01-15 DIAGNOSIS — J9621 Acute and chronic respiratory failure with hypoxia: Secondary | ICD-10-CM

## 2020-01-15 DIAGNOSIS — R001 Bradycardia, unspecified: Secondary | ICD-10-CM | POA: Diagnosis present

## 2020-01-15 DIAGNOSIS — A419 Sepsis, unspecified organism: Secondary | ICD-10-CM | POA: Diagnosis present

## 2020-01-15 DIAGNOSIS — Z9981 Dependence on supplemental oxygen: Secondary | ICD-10-CM

## 2020-01-15 DIAGNOSIS — Z7901 Long term (current) use of anticoagulants: Secondary | ICD-10-CM

## 2020-01-15 DIAGNOSIS — Z20822 Contact with and (suspected) exposure to covid-19: Secondary | ICD-10-CM | POA: Diagnosis present

## 2020-01-15 DIAGNOSIS — I482 Chronic atrial fibrillation, unspecified: Secondary | ICD-10-CM

## 2020-01-15 DIAGNOSIS — R531 Weakness: Secondary | ICD-10-CM | POA: Diagnosis not present

## 2020-01-15 DIAGNOSIS — G4733 Obstructive sleep apnea (adult) (pediatric): Secondary | ICD-10-CM | POA: Diagnosis present

## 2020-01-15 DIAGNOSIS — R0902 Hypoxemia: Secondary | ICD-10-CM

## 2020-01-15 DIAGNOSIS — Z6833 Body mass index (BMI) 33.0-33.9, adult: Secondary | ICD-10-CM

## 2020-01-15 DIAGNOSIS — I132 Hypertensive heart and chronic kidney disease with heart failure and with stage 5 chronic kidney disease, or end stage renal disease: Secondary | ICD-10-CM | POA: Diagnosis present

## 2020-01-15 DIAGNOSIS — E1122 Type 2 diabetes mellitus with diabetic chronic kidney disease: Secondary | ICD-10-CM | POA: Diagnosis present

## 2020-01-15 DIAGNOSIS — N185 Chronic kidney disease, stage 5: Secondary | ICD-10-CM | POA: Diagnosis not present

## 2020-01-15 DIAGNOSIS — D509 Iron deficiency anemia, unspecified: Secondary | ICD-10-CM | POA: Diagnosis present

## 2020-01-15 DIAGNOSIS — R6520 Severe sepsis without septic shock: Secondary | ICD-10-CM | POA: Diagnosis not present

## 2020-01-15 DIAGNOSIS — Z9049 Acquired absence of other specified parts of digestive tract: Secondary | ICD-10-CM

## 2020-01-15 DIAGNOSIS — I5032 Chronic diastolic (congestive) heart failure: Secondary | ICD-10-CM | POA: Diagnosis not present

## 2020-01-15 DIAGNOSIS — Z79899 Other long term (current) drug therapy: Secondary | ICD-10-CM

## 2020-01-15 DIAGNOSIS — K219 Gastro-esophageal reflux disease without esophagitis: Secondary | ICD-10-CM | POA: Diagnosis present

## 2020-01-15 DIAGNOSIS — Z7983 Long term (current) use of bisphosphonates: Secondary | ICD-10-CM

## 2020-01-15 DIAGNOSIS — Z801 Family history of malignant neoplasm of trachea, bronchus and lung: Secondary | ICD-10-CM

## 2020-01-15 DIAGNOSIS — Z992 Dependence on renal dialysis: Secondary | ICD-10-CM

## 2020-01-15 DIAGNOSIS — Z794 Long term (current) use of insulin: Secondary | ICD-10-CM

## 2020-01-15 DIAGNOSIS — E785 Hyperlipidemia, unspecified: Secondary | ICD-10-CM | POA: Diagnosis present

## 2020-01-15 DIAGNOSIS — Z90710 Acquired absence of both cervix and uterus: Secondary | ICD-10-CM

## 2020-01-15 LAB — CBC WITH DIFFERENTIAL/PLATELET
Abs Immature Granulocytes: 0.02 10*3/uL (ref 0.00–0.07)
Basophils Absolute: 0 10*3/uL (ref 0.0–0.1)
Basophils Relative: 0 %
Eosinophils Absolute: 0 10*3/uL (ref 0.0–0.5)
Eosinophils Relative: 0 %
HCT: 31.5 % — ABNORMAL LOW (ref 36.0–46.0)
Hemoglobin: 9.3 g/dL — ABNORMAL LOW (ref 12.0–15.0)
Immature Granulocytes: 0 %
Lymphocytes Relative: 5 %
Lymphs Abs: 0.4 10*3/uL — ABNORMAL LOW (ref 0.7–4.0)
MCH: 25.8 pg — ABNORMAL LOW (ref 26.0–34.0)
MCHC: 29.5 g/dL — ABNORMAL LOW (ref 30.0–36.0)
MCV: 87.3 fL (ref 80.0–100.0)
Monocytes Absolute: 0.3 10*3/uL (ref 0.1–1.0)
Monocytes Relative: 4 %
Neutro Abs: 7 10*3/uL (ref 1.7–7.7)
Neutrophils Relative %: 91 %
Platelets: 244 10*3/uL (ref 150–400)
RBC: 3.61 MIL/uL — ABNORMAL LOW (ref 3.87–5.11)
RDW: 15.3 % (ref 11.5–15.5)
WBC: 7.7 10*3/uL (ref 4.0–10.5)
nRBC: 0 % (ref 0.0–0.2)

## 2020-01-15 LAB — PROCALCITONIN: Procalcitonin: 0.17 ng/mL

## 2020-01-15 LAB — COMPREHENSIVE METABOLIC PANEL
ALT: 15 U/L (ref 0–44)
AST: 15 U/L (ref 15–41)
Albumin: 3.3 g/dL — ABNORMAL LOW (ref 3.5–5.0)
Alkaline Phosphatase: 79 U/L (ref 38–126)
Anion gap: 13 (ref 5–15)
BUN: 67 mg/dL — ABNORMAL HIGH (ref 8–23)
CO2: 15 mmol/L — ABNORMAL LOW (ref 22–32)
Calcium: 8.3 mg/dL — ABNORMAL LOW (ref 8.9–10.3)
Chloride: 109 mmol/L (ref 98–111)
Creatinine, Ser: 5.16 mg/dL — ABNORMAL HIGH (ref 0.44–1.00)
GFR calc Af Amer: 8 mL/min — ABNORMAL LOW (ref >60)
GFR calc non Af Amer: 7 mL/min — ABNORMAL LOW (ref >60)
Glucose, Bld: 105 mg/dL — ABNORMAL HIGH (ref 70–99)
Potassium: 4.6 mmol/L (ref 3.5–5.1)
Sodium: 137 mmol/L (ref 135–145)
Total Bilirubin: 0.9 mg/dL (ref 0.3–1.2)
Total Protein: 6.4 g/dL — ABNORMAL LOW (ref 6.5–8.1)

## 2020-01-15 LAB — RESPIRATORY PANEL BY RT PCR (FLU A&B, COVID)
Influenza A by PCR: NEGATIVE
Influenza B by PCR: NEGATIVE
SARS Coronavirus 2 by RT PCR: NEGATIVE

## 2020-01-15 LAB — TROPONIN I (HIGH SENSITIVITY)
Troponin I (High Sensitivity): 11 ng/L (ref <18)
Troponin I (High Sensitivity): 11 ng/L (ref <18)

## 2020-01-15 LAB — LACTIC ACID, PLASMA: Lactic Acid, Venous: 1.1 mmol/L (ref 0.5–1.9)

## 2020-01-15 LAB — BRAIN NATRIURETIC PEPTIDE: B Natriuretic Peptide: 256 pg/mL — ABNORMAL HIGH (ref 0.0–100.0)

## 2020-01-15 MED ORDER — ONDANSETRON HCL 4 MG PO TABS: 4.0000 mg | ORAL_TABLET | Freq: Four times a day (QID) | ORAL | Status: DC | PRN

## 2020-01-15 MED ORDER — GUAIFENESIN ER 600 MG PO TB12
600.0000 mg | ORAL_TABLET | Freq: Two times a day (BID) | ORAL | Status: DC
  Administered 2020-01-16 – 2020-01-23 (×16): 600 mg via ORAL
  Filled 2020-01-15 (×16): qty 1

## 2020-01-15 MED ORDER — SODIUM CHLORIDE 0.9 % IV SOLN: Freq: Once | INTRAVENOUS | Status: AC

## 2020-01-15 MED ORDER — AMLODIPINE BESYLATE 5 MG PO TABS: 5.0000 mg | ORAL_TABLET | Freq: Every day | ORAL | Status: DC

## 2020-01-15 MED ORDER — SODIUM CHLORIDE 0.9 % IV BOLUS
250.0000 mL | Freq: Once | INTRAVENOUS | Status: AC
  Administered 2020-01-15: 250 mL via INTRAVENOUS

## 2020-01-15 MED ORDER — ONDANSETRON 4 MG PO TBDP
4.0000 mg | ORAL_TABLET | Freq: Once | ORAL | Status: AC
  Administered 2020-01-15: 21:00:00 4 mg via ORAL
  Filled 2020-01-15: qty 1

## 2020-01-15 MED ORDER — FERROUS SULFATE 325 (65 FE) MG PO TABS
325.0000 mg | ORAL_TABLET | Freq: Every day | ORAL | Status: DC
  Administered 2020-01-16 – 2020-01-17 (×2): 325 mg via ORAL
  Filled 2020-01-15 (×3): qty 1

## 2020-01-15 MED ORDER — ONDANSETRON HCL 4 MG/2ML IJ SOLN
4.0000 mg | Freq: Four times a day (QID) | INTRAMUSCULAR | Status: DC | PRN
  Administered 2020-01-18 – 2020-01-19 (×3): 4 mg via INTRAVENOUS
  Filled 2020-01-15 (×3): qty 2

## 2020-01-15 MED ORDER — APIXABAN 2.5 MG PO TABS
2.5000 mg | ORAL_TABLET | Freq: Two times a day (BID) | ORAL | Status: DC
  Administered 2020-01-16 (×3): 2.5 mg via ORAL
  Filled 2020-01-15 (×4): qty 1

## 2020-01-15 MED ORDER — SODIUM CHLORIDE 0.9 % IV SOLN
500.0000 mg | Freq: Once | INTRAVENOUS | Status: AC
  Administered 2020-01-15: 21:00:00 500 mg via INTRAVENOUS
  Filled 2020-01-15: qty 500

## 2020-01-15 MED ORDER — SODIUM CHLORIDE 0.9 % IV SOLN
2.0000 g | Freq: Once | INTRAVENOUS | Status: AC
  Administered 2020-01-15: 2 g via INTRAVENOUS
  Filled 2020-01-15: qty 2

## 2020-01-15 MED ORDER — ALBUTEROL SULFATE (2.5 MG/3ML) 0.083% IN NEBU
2.5000 mg | INHALATION_SOLUTION | Freq: Four times a day (QID) | RESPIRATORY_TRACT | Status: DC | PRN
  Administered 2020-01-18: 2.5 mg via RESPIRATORY_TRACT
  Filled 2020-01-15: qty 3

## 2020-01-15 MED ORDER — SODIUM CHLORIDE 0.9 % IV SOLN
2.0000 g | INTRAVENOUS | Status: AC
  Administered 2020-01-16 – 2020-01-20 (×5): 2 g via INTRAVENOUS
  Filled 2020-01-15: qty 2
  Filled 2020-01-15 (×2): qty 20
  Filled 2020-01-15 (×2): qty 2

## 2020-01-15 MED ORDER — LACTATED RINGERS IV BOLUS
500.0000 mL | Freq: Once | INTRAVENOUS | Status: AC
  Administered 2020-01-15: 500 mL via INTRAVENOUS

## 2020-01-15 MED ORDER — TRAZODONE HCL 50 MG PO TABS
25.0000 mg | ORAL_TABLET | Freq: Every evening | ORAL | Status: DC | PRN
  Administered 2020-01-18: 22:00:00 25 mg via ORAL
  Filled 2020-01-15: qty 1

## 2020-01-15 MED ORDER — ACETAMINOPHEN 325 MG PO TABS
650.0000 mg | ORAL_TABLET | Freq: Four times a day (QID) | ORAL | Status: DC | PRN
  Administered 2020-01-18: 650 mg via ORAL
  Filled 2020-01-15: qty 2

## 2020-01-15 MED ORDER — ACETAMINOPHEN 650 MG RE SUPP: 650.0000 mg | Freq: Four times a day (QID) | RECTAL | Status: DC | PRN

## 2020-01-15 MED ORDER — PANTOPRAZOLE SODIUM 40 MG PO TBEC
40.0000 mg | DELAYED_RELEASE_TABLET | Freq: Every day | ORAL | Status: DC
  Administered 2020-01-16 – 2020-01-23 (×9): 40 mg via ORAL
  Filled 2020-01-15 (×9): qty 1

## 2020-01-15 MED ORDER — MAGNESIUM HYDROXIDE 400 MG/5ML PO SUSP: 30.0000 mL | Freq: Every day | ORAL | Status: DC | PRN

## 2020-01-15 MED ORDER — ENOXAPARIN SODIUM 40 MG/0.4ML ~~LOC~~ SOLN: 40.0000 mg | SUBCUTANEOUS | Status: DC

## 2020-01-15 MED ORDER — CALCIUM CARBONATE-VITAMIN D 500-200 MG-UNIT PO TABS
1.0000 | ORAL_TABLET | Freq: Every day | ORAL | Status: DC
  Administered 2020-01-16 – 2020-01-23 (×8): 1 via ORAL
  Filled 2020-01-15 (×8): qty 1

## 2020-01-15 MED ORDER — VANCOMYCIN HCL IN DEXTROSE 1-5 GM/200ML-% IV SOLN
1000.0000 mg | Freq: Once | INTRAVENOUS | Status: AC
  Administered 2020-01-15: 1000 mg via INTRAVENOUS
  Filled 2020-01-15: qty 200

## 2020-01-15 MED ORDER — CLONIDINE HCL 0.1 MG PO TABS: 0.1000 mg | ORAL_TABLET | Freq: Two times a day (BID) | ORAL | Status: DC

## 2020-01-15 MED ORDER — SODIUM CHLORIDE 0.9 % IV SOLN: 500.0000 mg | INTRAVENOUS | Status: DC

## 2020-01-15 MED ORDER — ALENDRONATE SODIUM 70 MG PO TABS: 70.0000 mg | ORAL_TABLET | ORAL | Status: DC

## 2020-01-15 MED ORDER — INSULIN NPH (HUMAN) (ISOPHANE) 100 UNIT/ML ~~LOC~~ SUSP: 15.0000 [IU] | SUBCUTANEOUS | Status: DC

## 2020-01-15 MED ORDER — SODIUM CHLORIDE 0.9 % IV SOLN: INTRAVENOUS | Status: DC

## 2020-01-15 MED ORDER — ATORVASTATIN CALCIUM 80 MG PO TABS
80.0000 mg | ORAL_TABLET | Freq: Every day | ORAL | Status: DC
  Administered 2020-01-16 – 2020-01-22 (×7): 80 mg via ORAL
  Filled 2020-01-15: qty 4
  Filled 2020-01-15 (×6): qty 1

## 2020-01-15 MED ORDER — CYANOCOBALAMIN 1000 MCG/ML IJ SOLN
1000.0000 ug | INTRAMUSCULAR | Status: DC
  Administered 2020-01-19: 1000 ug via INTRAMUSCULAR
  Filled 2020-01-15: qty 1

## 2020-01-15 MED ORDER — POTASSIUM CHLORIDE ER 10 MEQ PO TBCR: 10.0000 meq | EXTENDED_RELEASE_TABLET | Freq: Every day | ORAL | Status: DC

## 2020-01-15 NOTE — ED Provider Notes (Signed)
[facility] Emergency Department Provider Note    First MD Initiated Contact with Patient 01/15/20 1835     (approximate)  I have reviewed the triage vital signs and the nursing notes.   HISTORY  Chief Complaint Weakness    HPI Dorothy [name] is a 85 y.o. female   presents to the ER for evaluation of generalized malaise shortness of breath decreased p.o. intake over the past several days.  Denies any measured fevers but has felt some chills.  No recent antibiotics.  She does wear 2 L nasal cannula chronically.  She denies any abdominal pain.  No flank pain.  Denies any chest pain or pressure.    Past Medical History:  Diagnosis Date  . Anemia   . Arthritis    hands, knees, back  . Atrial fibrillation ([place])   . CHF (congestive heart failure) ([place])   . COPD (chronic obstructive pulmonary disease) ([place])   . Diabetes mellitus without complication ([place])   . Dyspnea   . GERD (gastroesophageal reflux disease)   . Hyperlipidemia   . Hypertension   . PONV (postoperative nausea and vomiting)   . Sleep apnea    told she needs CPAP, does not have  . Wears dentures    full upper and lower.  only wears upper   No family history on file. Past Surgical History:  Procedure Laterality Date  . ABDOMINAL HYSTERECTOMY    . CATARACT EXTRACTION W/PHACO Right 07/12/2016   Procedure: CATARACT EXTRACTION PHACO AND INTRAOCULAR LENS PLACEMENT (IOC);  Surgeon: Leandrew Koyanagi, MD;  Location: [place];  Service: Ophthalmology;  Laterality: Right;  DIABETIC - insulin and oral meds sleep apnea  . CATARACT EXTRACTION W/PHACO Left 04/09/2019   Procedure: CATARACT EXTRACTION PHACO AND INTRAOCULAR LENS PLACEMENT ([place])  LEFT DIABETIC;  Surgeon: Leandrew Koyanagi, MD;  Location: [place];  Service: Ophthalmology;  Laterality: Left;  Diabetic - insulin and oral meds  . CHOLECYSTECTOMY    . COLONOSCOPY N/A 01/19/2015   Procedure: COLONOSCOPY;  Surgeon:  Josefine Class, MD;  Location: [facility] ENDOSCOPY;  Service: Endoscopy;  Laterality: N/A;  . COLONOSCOPY WITH PROPOFOL N/A 07/11/2017   Procedure: COLONOSCOPY WITH PROPOFOL;  Surgeon: Toledo, Benay Pike, MD;  Location: [facility] ENDOSCOPY;  Service: Gastroenterology;  Laterality: N/A;  . ESOPHAGOGASTRODUODENOSCOPY N/A 01/19/2015   Procedure: ESOPHAGOGASTRODUODENOSCOPY (EGD);  Surgeon: Josefine Class, MD;  Location: [facility] ENDOSCOPY;  Service: Endoscopy;  Laterality: N/A;  . ESOPHAGOGASTRODUODENOSCOPY (EGD) WITH PROPOFOL N/A 07/11/2017   Procedure: ESOPHAGOGASTRODUODENOSCOPY (EGD) WITH PROPOFOL;  Surgeon: Toledo, Benay Pike, MD;  Location: [facility] ENDOSCOPY;  Service: Gastroenterology;  Laterality: N/A;  . SHOULDER ARTHROSCOPY     Patient Active Problem List   Diagnosis Date Noted  . Multifocal pneumonia 10/05/2019  . Acute hypoxemic respiratory failure ([place]) 10/05/2019  . Diabetes ([place]) 05/04/2017  . Iron deficiency anemia 12/28/2016  . Chronic diastolic heart failure ([place]) 12/26/2016  . HTN (hypertension) 12/26/2016  . Atrial fibrillation ([place]) 12/26/2016  . B12 deficiency   . Sepsis ([place]) 12/18/2016  . CAP (community acquired pneumonia) 12/18/2016  . Anemia 12/18/2016      Prior to Admission medications   Medication Sig Start Date End Date Taking? Authorizing Provider  acetaminophen (TYLENOL) 500 MG tablet Take 1,000 mg by mouth every 6 (six) hours as needed for moderate pain or headache.   Yes [provider]  albuterol (VENTOLIN HFA) 108 (90 Base) MCG/ACT inhaler Inhale 2 puffs into the lungs every 6 (six) hours as  needed for wheezing or shortness of breath. 10/11/19  Yes Sreenath, Sudheer B, MD  apixaban (ELIQUIS) 2.5 MG TABS tablet Take 2.5 mg by mouth 2 (two) times daily.    Yes [provider]  metFORMIN (GLUCOPHAGE) 1000 MG tablet Take 1,000 mg by mouth 2 (two) times daily. 06/02/19  Yes [provider]  alendronate (FOSAMAX) 70 MG tablet Take 70 mg by mouth  once a week.     [provider]  amLODipine (NORVASC) 5 MG tablet Take 5 mg by mouth daily.     [provider]  atorvastatin (LIPITOR) 80 MG tablet Take 80 mg by mouth daily.    [provider]  Calcium Carb-Cholecalciferol (CALCIUM 600 + D PO) Take 1 tablet by mouth daily.     [provider]  cloNIDine (CATAPRES) 0.1 MG tablet Take 0.1 mg by mouth 2 (two) times daily.    [provider]  cyanocobalamin (,VITAMIN B-12,) 1000 MCG/ML injection Inject 1,000 mcg into the muscle every 30 (thirty) days.     [provider]  esomeprazole (NEXIUM) 20 MG capsule Take 20 mg by mouth at bedtime.    [provider]  ferrous sulfate 325 (65 FE) MG tablet Take 325 mg by mouth daily with breakfast.    [provider]  furosemide (LASIX) 20 MG tablet Take 20 mg by mouth daily.     [provider]  insulin NPH Human (NOVOLIN N) 100 UNIT/ML injection Inject 15-20 Units into the skin See admin instructions. Inject 20u under the skin every morning and inject 15u under the skin ever evening    [provider]  ipratropium-albuterol (DUONEB) 0.5-2.5 (3) MG/3ML SOLN Take 3 mLs by nebulization 3 (three) times daily. 10/11/19   Ralene Muskrat B, MD  lisinopril (PRINIVIL,ZESTRIL) 10 MG tablet Take 10 mg by mouth daily.    [provider]  methylPREDNISolone (MEDROL) 4 MG tablet Methylprednisolone taper: Day 1: take 20mg  (5 tabs) Day 2: Take 16mg  (4 tabs) Day 3: Take 12mg  (3 tabs) Day 4: Take 8mg  (2 tabs) Day 5: Take 4mg  (1 tab) 10/11/19   Sreenath, Louanne Belton B, MD  potassium chloride (K-DUR) 10 MEQ tablet Take 10 mEq by mouth daily.     [provider]    Allergies Tramadol and Aspirin    Social History Social History   Tobacco Use  . Smoking status: Never Smoker  . Smokeless tobacco: Never Used  Substance Use Topics  . Alcohol use: No  . Drug use: Not on file    Review of Systems Patient denies  headaches, rhinorrhea, blurry vision, numbness, shortness of breath, chest pain, edema, cough, abdominal pain, nausea, vomiting, diarrhea, dysuria, fevers, rashes or hallucinations unless otherwise stated above in HPI. ____________________________________________   PHYSICAL EXAM:  VITAL SIGNS: Vitals:   01/15/20 1823 01/15/20 1956  BP: (!) 116/30   Pulse: (!) 53   Resp: 18   Temp:  (!) 93.2 F (34 C)  SpO2: 91%     Constitutional: Alert and oriented. Frail and ill appearing Eyes: Conjunctivae are normal.  Head: Atraumatic. Nose: No congestion/rhinnorhea. Mouth/Throat: Mucous membranes are moist.   Neck: No stridor. Painless ROM.  Cardiovascular: Normal rate, regular rhythm. Grossly normal heart sounds.  Good peripheral circulation. Respiratory: bibasilar inspiratory crackles Gastrointestinal: Soft and nontender. No distention. No abdominal bruits. No CVA tenderness. Genitourinary: deferred  Musculoskeletal: No lower extremity tenderness, BLE edema.  No joint effusions. Neurologic:  Normal speech and language. No gross focal neurologic deficits  are appreciated. No facial droop Skin:  Skin is warm, dry and intact. No rash noted. Psychiatric: Mood and affect are normal. Speech and behavior are normal.  ____________________________________________   LABS (all labs ordered are listed, but only abnormal results are displayed)  Results for orders placed or performed during the hospital encounter of 01/15/20 (from the past 24 hour(s))  CBC with Differential/Platelet     Status: Abnormal   Collection Time: 01/15/20  6:47 PM  Result Value Ref Range   WBC 7.7 4.0 - 10.5 K/uL   RBC 3.61 (L) 3.87 - 5.11 MIL/uL   Hemoglobin 9.3 (L) 12.0 - 15.0 g/dL   HCT 31.5 (L) 36.0 - 46.0 %   MCV 87.3 80.0 - 100.0 fL   MCH 25.8 (L) 26.0 - 34.0 pg   MCHC 29.5 (L) 30.0 - 36.0 g/dL   RDW 15.3 11.5 - 15.5 %   Platelets 244 150 - 400 K/uL   nRBC 0.0 0.0 - 0.2 %   Neutrophils Relative % 91 %    Neutro Abs 7.0 1.7 - 7.7 K/uL   Lymphocytes Relative 5 %   Lymphs Abs 0.4 (L) 0.7 - 4.0 K/uL   Monocytes Relative 4 %   Monocytes Absolute 0.3 0.1 - 1.0 K/uL   Eosinophils Relative 0 %   Eosinophils Absolute 0.0 0.0 - 0.5 K/uL   Basophils Relative 0 %   Basophils Absolute 0.0 0.0 - 0.1 K/uL   Immature Granulocytes 0 %   Abs Immature Granulocytes 0.02 0.00 - 0.07 K/uL  Comprehensive metabolic panel     Status: Abnormal   Collection Time: 01/15/20  6:47 PM  Result Value Ref Range   Sodium 137 135 - 145 mmol/L   Potassium 4.6 3.5 - 5.1 mmol/L   Chloride 109 98 - 111 mmol/L   CO2 15 (L) 22 - 32 mmol/L   Glucose, Bld 105 (H) 70 - 99 mg/dL   BUN 67 (H) 8 - 23 mg/dL   Creatinine, Ser 5.16 (H) 0.44 - 1.00 mg/dL   Calcium 8.3 (L) 8.9 - 10.3 mg/dL   Total Protein 6.4 (L) 6.5 - 8.1 g/dL   Albumin 3.3 (L) 3.5 - 5.0 g/dL   AST 15 15 - 41 U/L   ALT 15 0 - 44 U/L   Alkaline Phosphatase 79 38 - 126 U/L   Total Bilirubin 0.9 0.3 - 1.2 mg/dL   GFR calc non Af Amer 7 (L) >60 mL/min   GFR calc Af Amer 8 (L) >60 mL/min   Anion gap 13 5 - 15  Brain natriuretic peptide     Status: Abnormal   Collection Time: 01/15/20  6:47 PM  Result Value Ref Range   B Natriuretic Peptide 256.0 (H) 0.0 - 100.0 pg/mL  Troponin I (High Sensitivity)     Status: None   Collection Time: 01/15/20  6:47 PM  Result Value Ref Range   Troponin I (High Sensitivity) 11 <18 ng/L   ____________________________________________  EKG My review and personal interpretation at Time: 18:27   Indication: weakness  Rate: 50  Rhythm: sinus bradycardia vs undertermined or junctinoal Axis:  normal Other: nonspecific st abn ____________________________________________  RADIOLOGY  I personally reviewed all radiographic images ordered to evaluate for the above acute complaints and reviewed radiology reports and findings.  These findings were personally discussed with the patient.  Please see medical record for radiology  report.  ____________________________________________   PROCEDURES  Procedure(s) performed:  .Critical Care Performed by: Merlyn Lot, MD Authorized  by: Merlyn Lot, MD   Critical care provider statement:    Critical care time (minutes):  40   Critical care time was exclusive of:  Separately billable procedures and treating other patients   Critical care was necessary to treat or prevent imminent or life-threatening deterioration of the following conditions:  Renal failure   Critical care was time spent personally by me on the following activities:  Development of treatment plan with patient or surrogate, discussions with consultants, evaluation of patient's response to treatment, examination of patient, obtaining history from patient or surrogate, ordering and performing treatments and interventions, ordering and review of laboratory studies, ordering and review of radiographic studies, pulse oximetry, re-evaluation of patient's condition and review of old charts      Critical Care performed: yes ____________________________________________   INITIAL IMPRESSION / ASSESSMENT AND PLAN / ED COURSE  Pertinent labs & imaging results that were available during my care of the patient were reviewed by me and considered in my medical decision making (see chart for details).   DDX: Asthma, copd, CHF, pna, ptx, malignancy, Pe, anemia   Dorothy [name] is a 85 y.o. who presents to the ED with presentation as described above.  Patient is frail-appearing.  They are unable to obtain temperature upfront.  She is planning some swelling.  Question of a component of CHF versus possible pneumonia.  Her abdominal exam is soft and benign.  Doubt SBO.  Will check blood work.  The patient will be placed on continuous pulse oximetry and telemetry for monitoring.  Laboratory evaluation will be sent to evaluate for the above complaints.     Clinical Course as of Jan 15 2031  Thu Jan 15, 2020   2003 Patient is hypothermic.  I will give small bolus of IV fluids.  Have ordered septic work-up as well as order broad-spectrum antibiotics.   [PR]  2013 Discussed presentation and concern for critical illness with patient and family at bedside.  States that she does want to be full code.  Will give IV hydration.  Given her normal white count I am concerned for possible Covid related pneumonia.  She is receiving broad-spectrum antibiotics.  Will test for Covid will give IV fluids.  Will discuss with hospitalist for admission.   [PR]    Clinical Course User Index [PR] Merlyn Lot, MD    The patient was evaluated in Emergency Department today for the symptoms described in the history of present illness. He/she was evaluated in the context of the global COVID-19 pandemic, which necessitated consideration that the patient might be at risk for infection with the SARS-CoV-2 virus that causes COVID-19. Institutional protocols and algorithms that pertain to the evaluation of patients at risk for COVID-19 are in a state of rapid change based on information released by regulatory bodies including the CDC and federal and state organizations. These policies and algorithms were followed during the patient's care in the ED.  As part of my medical decision making, I reviewed the following data within the [place] notes reviewed and incorporated, Labs reviewed, notes from prior ED visits and [place] Controlled Substance Database   ____________________________________________   FINAL CLINICAL IMPRESSION(S) / ED DIAGNOSES  Final diagnoses:  Multifocal pneumonia  Acute renal failure, unspecified acute renal failure type ([place])      NEW MEDICATIONS STARTED DURING THIS VISIT:  New Prescriptions   No medications on file     Note:  This document was prepared using Dragon voice recognition  software and may include unintentional dictation errors.    Merlyn Lot,  MD 01/15/20 2032

## 2020-01-15 NOTE — ED Triage Notes (Signed)
Patient presents to the ED stating she has felt weak x 1 week.  Patient denies chest and shortness of breath.  Denies diarrhea or vomiting.  Patient's legs appear swollen.  Patient states that is somewhat normal for her.

## 2020-01-15 NOTE — Consult Note (Signed)
PHARMACY -  BRIEF ANTIBIOTIC NOTE   Pharmacy has received consult(s) for Vancomycin/Cefepime from an ED provider.  The patient's profile has been reviewed for ht/wt/allergies/indication/available labs.    One time order(s) placed for Vancomycin 1 g x1 dose and Cefepime 2g x1. Pharmacy will order MRSA PCR and will f/u with AM labs.   Further antibiotics/pharmacy consults should be ordered by admitting physician if indicated.                       Thank you, Rowland Lathe 01/15/2020  8:08 PM

## 2020-01-15 NOTE — H&P (Addendum)
at [place] NAME: Dorothy [name]    MR#:  [id number]  DATE OF BIRTH:  1935-08-08  DATE OF ADMISSION:  01/15/2020  PRIMARY CARE PHYSICIAN: Letta Median, MD   REQUESTING/REFERRING PHYSICIAN: Merlyn Lot, MD CHIEF COMPLAINT:   Chief Complaint  Patient presents with  . Weakness    HISTORY OF PRESENT ILLNESS:  Dorothy [name]  is a 85 y.o. Caucasian female with a known history of CHF, COPD, type 2 diabetes mellitus, GERD, dyslipidemia, hypertension and obstructive sleep apnea, who presented to the emergency room with acute onset of generalized weakness with associated dyspnea and diminished p.o. intake over the last week.  She admitted to chills without measured fever as well as mild dyspnea and dry cough and occasional wheezing.  No chest pain or palpitations.  No nausea or vomiting or abdominal pain.  No dysuria, oliguria or hematuria or flank pain..  She has chronic respiratory failure on home O2 at 2 L/min.  Upon presentation to the emergency room, heart rate was 53 pulse extremity 91% on 2 L of O2 by nasal cannula and later 94-95% on 3.5 L of O2, with blood pressure initially normal and later 99/46 with otherwise normal vital signs.  BUN and creatinine were 67 and 5.16 compared to 43/1.4 on 10/10/19.  BNP was 256 and CBC showed anemia close to baseline.  Albumin was 3.3 and total protein 6.4.  Portable chest x-ray showed interval mild patchy atelectasis or pneumonia at the right lung base with mild increased patchy atelectasis or pneumonia at the left lung base and possible bilateral small pleural effusions as well as stable borderline cardiomegaly.  The patient was given IV cefepime, Zithromax and vancomycin, 500 female IV lactated Ringer and 250 mL IV normal saline bolus, 4 mg p.o. Zofran 1 L infusion of 100 mL/h of IV normal saline.  She will be admitted to a medical monitored bed for further evaluation and management. PAST MEDICAL HISTORY:    Past Medical History:  Diagnosis Date  . Anemia   . Arthritis    hands, knees, back  . Atrial fibrillation ([place])   . CHF (congestive heart failure) ([place])   . COPD (chronic obstructive pulmonary disease) ([place])   . Diabetes mellitus without complication ([place])   . Dyspnea   . GERD (gastroesophageal reflux disease)   . Hyperlipidemia   . Hypertension   . PONV (postoperative nausea and vomiting)   . Sleep apnea    told she needs CPAP, does not have  . Wears dentures    full upper and lower.  only wears upper    PAST SURGICAL HISTORY:   Past Surgical History:  Procedure Laterality Date  . ABDOMINAL HYSTERECTOMY    . CATARACT EXTRACTION W/PHACO Right 07/12/2016   Procedure: CATARACT EXTRACTION PHACO AND INTRAOCULAR LENS PLACEMENT (IOC);  Surgeon: Leandrew Koyanagi, MD;  Location: [place];  Service: Ophthalmology;  Laterality: Right;  DIABETIC - insulin and oral meds sleep apnea  . CATARACT EXTRACTION W/PHACO Left 04/09/2019   Procedure: CATARACT EXTRACTION PHACO AND INTRAOCULAR LENS PLACEMENT ([place])  LEFT DIABETIC;  Surgeon: Leandrew Koyanagi, MD;  Location: [facility];  Service: Ophthalmology;  Laterality: Left;  Diabetic - insulin and oral meds  . CHOLECYSTECTOMY    . COLONOSCOPY N/A 01/19/2015   Procedure: COLONOSCOPY;  Surgeon: Josefine Class, MD;  Location: [facility] ENDOSCOPY;  Service: Endoscopy;  Laterality: N/A;  . COLONOSCOPY WITH PROPOFOL N/A 07/11/2017   Procedure: COLONOSCOPY WITH PROPOFOL;  Surgeon: Toledo, Benay Pike, MD;  Location: [facility] ENDOSCOPY;  Service: Gastroenterology;  Laterality: N/A;  . ESOPHAGOGASTRODUODENOSCOPY N/A 01/19/2015   Procedure: ESOPHAGOGASTRODUODENOSCOPY (EGD);  Surgeon: Josefine Class, MD;  Location: [facility] ENDOSCOPY;  Service: Endoscopy;  Laterality: N/A;  . ESOPHAGOGASTRODUODENOSCOPY (EGD) WITH PROPOFOL N/A 07/11/2017   Procedure: ESOPHAGOGASTRODUODENOSCOPY (EGD) WITH PROPOFOL;  Surgeon: Toledo, Benay Pike, MD;   Location: [facility] ENDOSCOPY;  Service: Gastroenterology;  Laterality: N/A;  . SHOULDER ARTHROSCOPY      SOCIAL HISTORY:   Social History   Tobacco Use  . Smoking status: Never Smoker  . Smokeless tobacco: Never Used  Substance Use Topics  . Alcohol use: No    FAMILY HISTORY:  Her father died from lung cancer.  Diabetes mellitus runs in her mother's side of family.  Both parents were hypertensive.  DRUG ALLERGIES:   Allergies  Allergen Reactions  . Tramadol Anaphylaxis  . Aspirin     "Stomach problems"    REVIEW OF SYSTEMS:   As per history of present illness. All pertinent systems were reviewed above. Constitutional,  HEENT, cardiovascular, respiratory, GI, GU, musculoskeletal, neuro, psychiatric, endocrine,  integumentary and hematologic systems were reviewed and are otherwise  negative/unremarkable except for positive findings mentioned above in the HPI.   MEDICATIONS AT HOME:   Prior to Admission medications   Medication Sig Start Date End Date Taking? Authorizing Provider  acetaminophen (TYLENOL) 500 MG tablet Take 1,000 mg by mouth every 6 (six) hours as needed for moderate pain or headache.   Yes [provider]  albuterol (VENTOLIN HFA) 108 (90 Base) MCG/ACT inhaler Inhale 2 puffs into the lungs every 6 (six) hours as needed for wheezing or shortness of breath. 10/11/19  Yes Sreenath, Sudheer B, MD  apixaban (ELIQUIS) 2.5 MG TABS tablet Take 2.5 mg by mouth 2 (two) times daily.    Yes [provider]  metFORMIN (GLUCOPHAGE) 1000 MG tablet Take 1,000 mg by mouth 2 (two) times daily. 06/02/19  Yes [provider]  alendronate (FOSAMAX) 70 MG tablet Take 70 mg by mouth once a week.     [provider]  amLODipine (NORVASC) 5 MG tablet Take 5 mg by mouth daily.     [provider]  atorvastatin (LIPITOR) 80 MG tablet Take 80 mg by mouth daily.    [provider]  Calcium Carb-Cholecalciferol (CALCIUM 600 + D PO) Take 1  tablet by mouth daily.     [provider]  cloNIDine (CATAPRES) 0.1 MG tablet Take 0.1 mg by mouth 2 (two) times daily.    [provider]  cyanocobalamin (,VITAMIN B-12,) 1000 MCG/ML injection Inject 1,000 mcg into the muscle every 30 (thirty) days.     [provider]  esomeprazole (NEXIUM) 20 MG capsule Take 20 mg by mouth at bedtime.    [provider]  ferrous sulfate 325 (65 FE) MG tablet Take 325 mg by mouth daily with breakfast.    [provider]  furosemide (LASIX) 20 MG tablet Take 20 mg by mouth daily.     [provider]  insulin NPH Human (NOVOLIN N) 100 UNIT/ML injection Inject 15-20 Units into the skin See admin instructions. Inject 20u under the skin every morning and inject 15u under the skin ever evening    [provider]  ipratropium-albuterol (DUONEB) 0.5-2.5 (3) MG/3ML SOLN Take 3 mLs by nebulization 3 (three) times daily. 10/11/19   Sidney Ace, MD  lisinopril (PRINIVIL,ZESTRIL) 10 MG tablet Take 10 mg by mouth  daily.    [provider]  methylPREDNISolone (MEDROL) 4 MG tablet Methylprednisolone taper: Day 1: take 20mg  (5 tabs) Day 2: Take 16mg  (4 tabs) Day 3: Take 12mg  (3 tabs) Day 4: Take 8mg  (2 tabs) Day 5: Take 4mg  (1 tab) 10/11/19   Sreenath, Louanne Belton B, MD  potassium chloride (K-DUR) 10 MEQ tablet Take 10 mEq by mouth daily.     [provider]      VITAL SIGNS:  Blood pressure (!) 116/30, pulse (!) 53, temperature (!) 93.2 F (34 C), temperature source Rectal, resp. rate 18, height 5\' 6"  (1.676 m), weight 87.7 kg, SpO2 91 %.  PHYSICAL EXAMINATION:   GENERAL:  85 y.o.-year-old Caucasian female patient lying in the bed with mild respiratory distress with conversational dyspnea. -EYES: Pupils equal, round, reactive to light and accommodation. No scleral icterus. Extraocular muscles intact.  HEENT: Head atraumatic, normocephalic. Oropharynx and nasopharynx clear.  NECK:  Supple, no  jugular venous distention. No thyroid enlargement, no tenderness.  LUNGS: Diminished bibasal and midlung zone breath sounds. CARDIOVASCULAR: Regular rate and rhythm, S1, S2 normal. No murmurs, rubs, or gallops.  ABDOMEN: Soft, nondistended, nontender. Bowel sounds present. No organomegaly or mass.  EXTREMITIES: No pedal edema, cyanosis, or clubbing.  NEUROLOGIC: Cranial nerves II through XII are intact. Muscle strength 5/5 in all extremities. Sensation intact. Gait not checked.  PSYCHIATRIC: The patient is alert and oriented x 3.  Normal affect and good eye contact. SKIN: No obvious rash, lesion, or ulcer.   LABORATORY PANEL:   CBC Recent Labs  Lab 01/15/20 1847  WBC 7.7  HGB 9.3*  HCT 31.5*  PLT 244   ------------------------------------------------------------------------------------------------------------------  Chemistries  Recent Labs  Lab 01/15/20 1847  NA 137  K 4.6  CL 109  CO2 15*  GLUCOSE 105*  BUN 67*  CREATININE 5.16*  CALCIUM 8.3*  AST 15  ALT 15  ALKPHOS 79  BILITOT 0.9   ------------------------------------------------------------------------------------------------------------------  Cardiac Enzymes No results for input(s): TROPONINI in the last 168 hours. ------------------------------------------------------------------------------------------------------------------  RADIOLOGY:  DG Chest Portable 1 View  Result Date: 01/15/2020 CLINICAL DATA:  Shortness of breath and weakness. EXAM: PORTABLE CHEST 1 VIEW COMPARISON:  10/06/2019 FINDINGS: The cardiac silhouette remains borderline enlarged. Linear opacities are again demonstrated in the mid and lower lung zones on the left, resolved in the upper lung zones. Interval mild patchy density at the right lung base. Mildly increased patchy density at the left lung base with possible small bilateral pleural effusions. Diffuse osteopenia. IMPRESSION: 1. Interval mild patchy atelectasis or pneumonia at the right  lung base. 2. Mildly increased patchy atelectasis or pneumonia at the left lung base. 3. Possible small bilateral pleural effusions. 4. Stable borderline cardiomegaly. Electronically Signed   By: Claudie Revering M.D.   On: 01/15/2020 19:12      IMPRESSION AND PLAN:   1.  Multifocal pneumonia. -The patient will be admitted to a progressive unit bed. -We will continue antibiotic therapy with IV Rocephin and Zithromax. -We will obtain sputum Gram stain culture and sensitivity as well as pneumonia antigens. -We will follow blood cultures drawn in the ER. -Mucolytic therapy will be provided as well as as needed duo nebs -O2 protocol will be followed.  2.  Acute on chronic hypoxic respiratory failure secondary to #1. -O2 protocol will be followed.  Management otherwise as above.  3.  Sepsis secondary to #1 without severe sepsis or septic shock. -This manifested by hypothermia, mild hypotension and worsening hypoxemia -We will continue  antibiotic therapy as mentioned above and follow blood and sputum culture.  4.  Acute kidney injury superimposed on stage IIIb chronic kidney disease. -The patient will be hydrated with IV normal saline and will follow BMP. -We will hold lisinopril and Metformin.  5.  Dyslipidemia. -We will continue statin therapy.  6.  Hypertension. -We will continue antihypertensives with holding parameters.  7.  Type 2 diabetes mellitus. -We will place the patient on supplemental coverage with NovoLog. -We will hold off Metformin.  8.  GERD. -We will continue PPI therapy.  9.  DVT prophylaxis. -Subtenons Lovenox    All the records are reviewed and case discussed with ED provider. The plan of care was discussed in details with the patient (and family). I answered all questions. The patient agreed to proceed with the above mentioned plan. Further management will depend upon hospital course.   CODE STATUS: Full code  Status is: Inpatient  Remains inpatient  appropriate because:Ongoing diagnostic testing needed not appropriate for outpatient work up, Unsafe d/c plan, IV treatments appropriate due to intensity of illness or inability to take PO and Inpatient level of care appropriate due to severity of illness   Dispo: The patient is from: Home              Anticipated d/c is to: Home              Anticipated d/c date is: 2 days              Patient currently is not medically stable to d/c.    TOTAL TIME TAKING CARE OF THIS PATIENT: 55 minutes.    Christel Mormon M.D on 01/15/2020 at 8:47 PM  [facility]   From 7 PM-7 AM, contact night-coverage [email or web address]  CC: Primary care physician; Letta Median, MD   Note: This dictation was prepared with Dragon dictation along with smaller phrase technology. Any transcriptional errors that result from this process are unintentional.

## 2020-01-15 NOTE — ED Notes (Signed)
MD made aware patient temp is 93.2. Bear hugger applied at this time.

## 2020-01-15 NOTE — ED Notes (Signed)
Patient rectal temp is now 93.4, [name], MD made aware. Patient also observed sating at 88% on 2Ls [place] and is increased to 3Ls observed sating at 92%.

## 2020-01-16 ENCOUNTER — Inpatient Hospital Stay: Payer: Medicare Other

## 2020-01-16 ENCOUNTER — Inpatient Hospital Stay
Admit: 2020-01-16 | Discharge: 2020-01-16 | Disposition: A | Discharge status: Home / Self Care | Payer: Medicare Other | Attending: Internal Medicine | Admitting: Internal Medicine

## 2020-01-16 DIAGNOSIS — J189 Pneumonia, unspecified organism: Secondary | ICD-10-CM

## 2020-01-16 DIAGNOSIS — N1832 Chronic kidney disease, stage 3b: Secondary | ICD-10-CM

## 2020-01-16 DIAGNOSIS — N170 Acute kidney failure with tubular necrosis: Secondary | ICD-10-CM

## 2020-01-16 DIAGNOSIS — R6520 Severe sepsis without septic shock: Secondary | ICD-10-CM

## 2020-01-16 LAB — BASIC METABOLIC PANEL
Anion gap: 11 (ref 5–15)
Anion gap: 12 (ref 5–15)
BUN: 66 mg/dL — ABNORMAL HIGH (ref 8–23)
BUN: 65 mg/dL — ABNORMAL HIGH (ref 8–23)
CO2: 16 mmol/L — ABNORMAL LOW (ref 22–32)
CO2: 17 mmol/L — ABNORMAL LOW (ref 22–32)
Calcium: 7.7 mg/dL — ABNORMAL LOW (ref 8.9–10.3)
Calcium: 7.3 mg/dL — ABNORMAL LOW (ref 8.9–10.3)
Chloride: 111 mmol/L (ref 98–111)
Chloride: 109 mmol/L (ref 98–111)
Creatinine, Ser: 5.54 mg/dL — ABNORMAL HIGH (ref 0.44–1.00)
Creatinine, Ser: 5.5 mg/dL — ABNORMAL HIGH (ref 0.44–1.00)
GFR calc Af Amer: 8 mL/min — ABNORMAL LOW (ref >60)
GFR calc Af Amer: 8 mL/min — ABNORMAL LOW (ref >60)
GFR calc non Af Amer: 7 mL/min — ABNORMAL LOW (ref >60)
GFR calc non Af Amer: 7 mL/min — ABNORMAL LOW (ref >60)
Glucose, Bld: 90 mg/dL (ref 70–99)
Glucose, Bld: 125 mg/dL — ABNORMAL HIGH (ref 70–99)
Potassium: 4.7 mmol/L (ref 3.5–5.1)
Potassium: 4 mmol/L (ref 3.5–5.1)
Sodium: 138 mmol/L (ref 135–145)
Sodium: 138 mmol/L (ref 135–145)

## 2020-01-16 LAB — GLUCOSE, CAPILLARY
Glucose-Capillary: 82 mg/dL (ref 70–99)
Glucose-Capillary: 72 mg/dL (ref 70–99)
Glucose-Capillary: 97 mg/dL (ref 70–99)
Glucose-Capillary: 107 mg/dL — ABNORMAL HIGH (ref 70–99)
Glucose-Capillary: 110 mg/dL — ABNORMAL HIGH (ref 70–99)

## 2020-01-16 LAB — ECHOCARDIOGRAM COMPLETE
Height: 66 in
Weight: 3093.49 oz

## 2020-01-16 LAB — CBC
HCT: 27.7 % — ABNORMAL LOW (ref 36.0–46.0)
Hemoglobin: 8.3 g/dL — ABNORMAL LOW (ref 12.0–15.0)
MCH: 25.9 pg — ABNORMAL LOW (ref 26.0–34.0)
MCHC: 30 g/dL (ref 30.0–36.0)
MCV: 86.6 fL (ref 80.0–100.0)
Platelets: 203 10*3/uL (ref 150–400)
RBC: 3.2 MIL/uL — ABNORMAL LOW (ref 3.87–5.11)
RDW: 15.4 % (ref 11.5–15.5)
WBC: 7.8 10*3/uL (ref 4.0–10.5)
nRBC: 0 % (ref 0.0–0.2)

## 2020-01-16 LAB — HEMOGLOBIN A1C
Hgb A1c MFr Bld: 6.4 % — ABNORMAL HIGH (ref 4.8–5.6)
Mean Plasma Glucose: 136.98 mg/dL

## 2020-01-16 LAB — HIV ANTIBODY (ROUTINE TESTING W REFLEX): HIV Screen 4th Generation wRfx: NONREACTIVE

## 2020-01-16 LAB — TSH: TSH: 2.417 u[IU]/mL (ref 0.350–4.500)

## 2020-01-16 LAB — LACTIC ACID, PLASMA
Lactic Acid, Venous: 0.7 mmol/L (ref 0.5–1.9)
Lactic Acid, Venous: 0.7 mmol/L (ref 0.5–1.9)

## 2020-01-16 MED ORDER — INSULIN ASPART 100 UNIT/ML ~~LOC~~ SOLN
0.0000 [IU] | Freq: Three times a day (TID) | SUBCUTANEOUS | Status: DC
  Administered 2020-01-17 – 2020-01-18 (×3): 1 [IU] via SUBCUTANEOUS
  Filled 2020-01-16 (×3): qty 1

## 2020-01-16 MED ORDER — SODIUM BICARBONATE-DEXTROSE 150-5 MEQ/L-% IV SOLN
150.0000 meq | INTRAVENOUS | Status: DC
  Administered 2020-01-17 (×2): 150 meq via INTRAVENOUS
  Filled 2020-01-16 (×4): qty 1000

## 2020-01-16 MED ORDER — SODIUM BICARBONATE 8.4 % IV SOLN
100.0000 meq | INTRAVENOUS | Status: AC
  Administered 2020-01-16: 07:00:00 100 meq via INTRAVENOUS
  Filled 2020-01-16: qty 100

## 2020-01-16 MED ORDER — CHLORHEXIDINE GLUCONATE CLOTH 2 % EX PADS
6.0000 | MEDICATED_PAD | Freq: Every day | CUTANEOUS | Status: DC
  Administered 2020-01-17 – 2020-01-22 (×4): 6 via TOPICAL

## 2020-01-16 MED ORDER — LACTATED RINGERS IV BOLUS
500.0000 mL | Freq: Once | INTRAVENOUS | Status: AC
  Administered 2020-01-16: 500 mL via INTRAVENOUS

## 2020-01-16 MED ORDER — ALBUMIN HUMAN 25 % IV SOLN
25.0000 g | Freq: Once | INTRAVENOUS | Status: AC
  Administered 2020-01-16: 05:00:00 25 g via INTRAVENOUS
  Filled 2020-01-16: qty 100

## 2020-01-16 MED ORDER — SODIUM CHLORIDE 0.9 % IV BOLUS
250.0000 mL | Freq: Once | INTRAVENOUS | Status: AC
  Administered 2020-01-16: 250 mL via INTRAVENOUS

## 2020-01-16 MED ORDER — SODIUM CHLORIDE 0.9 % IV SOLN
100.0000 mg | Freq: Two times a day (BID) | INTRAVENOUS | Status: DC
  Administered 2020-01-16 – 2020-01-17 (×3): 100 mg via INTRAVENOUS
  Filled 2020-01-16 (×4): qty 100

## 2020-01-16 MED ORDER — SODIUM BICARBONATE-DEXTROSE 150-5 MEQ/L-% IV SOLN
150.0000 meq | INTRAVENOUS | Status: DC
  Administered 2020-01-16: 150 meq via INTRAVENOUS
  Filled 2020-01-16 (×2): qty 1000

## 2020-01-16 NOTE — Consult Note (Signed)
Name: Dorothy [name] MRN: [id number] DOB: 1934-11-27    ADMISSION DATE:  01/15/2020 CONSULTATION DATE:  01/16/2020  REFERRING MD :  Sharion Settler, NP  CHIEF COMPLAINT:  Hypotension  BRIEF PATIENT DESCRIPTION:  85 y.o. Female, DNR/DNI, admitted 01/15/20 with severe Sepsis secondary to Multifocal Pneumonia, along with severe AKI and symptomatic Bradycardia.  On 01/16/20 she became hypotensive requiring transfer to ICU for possible vasopressors.  SIGNIFICANT EVENTS  5/6: Admission to Telemetry 5/7: Consults to Nephrology & Cardiology 5/7: Hypotensive, transferred to ICU for vasopressors, PCCM consulted  STUDIES:  5/6: CXR>>1. Interval mild patchy atelectasis or pneumonia at the right lung base. 2. Mildly increased patchy atelectasis or pneumonia at the left lung base. 3. Possible small bilateral pleural effusions. 4. Stable borderline cardiomegaly. 5/7: Renal US>Negative for hydronephrosis of the bilateral kidneys. Right-sided Bosniak 2 cyst and a left-sided Bosniak 1 cyst.   CULTURES: SARS-CoV-2 PCR 5/6>> negative Influenza PCR 5/6>> negative HIV screen 5/7>> negative Blood culture x2 5/6>> Strep pneumo urinary antigen 5/7>> Legionella urinary antigen 5/7>>  ANTIBIOTICS: Azithromycin 5/6>> 5/6 Ceftriaxone 5/6>> Doxycycline 5/7>>  HISTORY OF PRESENT ILLNESS:   Dorothy [name] is a 85 year old female with a past medical history significant for CHF, COPD on 2 L supplemental O2, OSA, type 2 diabetes mellitus, GERD, hypertension who presented to [facility] ED on 01/15/2020 with acute onset of dyspnea with associated generalized weakness.  She reported poor p.o. intake over the past week, along with chills without measured fever, along with dry cough and occasional wheezing.  She denied chest pain or palpitations, nausea, vomiting, diarrhea, abdominal pain, dysuria.  Upon presentation to the ED she was noted to be bradycardic with heart rate 53, O2 saturation 91% on 2 L, and blood pressure  99/46.  Initial work-up revealed BUN 67, creatinine 5.16, BNP 256.  Chest x-ray with mild patchy atelectasis versus pneumonia at the bilateral lung bases, and possible small bilateral effusions and stable cardiomegaly.  She was admitted to the telemetry unit by the hospitalist for further work-up of sepsis secondary to multifocal pneumonia, along with AKI.  Overnight on 5/6 she exhibited persistent hypotension necessitating multiple fluid boluses, along with bradycardia (heart rate in the 20s to 30s) and atrial fibrillation.  On 01/16/2020 she was noted to have worsening AKI, and cardiology and nephrology were consulted.  She was placed on bicarb infusion.  Late in evening she continues to be hypotensive and bradycardic with minimal urinary output.  She is to be transferred to ICU for initiation of vasopressors.  PCCM is consulted for further management of septic shock secondary to multifocal pneumonia, symptomatic bradycardia, and severe AKI.  PAST MEDICAL HISTORY :   has a past medical history of Anemia, Arthritis, Atrial fibrillation ([place]), CHF (congestive heart failure) ([place]), COPD (chronic obstructive pulmonary disease) ([place]), Diabetes mellitus without complication ([place]), Dyspnea, GERD (gastroesophageal reflux disease), Hyperlipidemia, Hypertension, PONV (postoperative nausea and vomiting), Sleep apnea, and Wears dentures.  has a past surgical history that includes Abdominal hysterectomy; Shoulder arthroscopy; Cholecystectomy; Colonoscopy (N/A, 01/19/2015); Esophagogastroduodenoscopy (N/A, 01/19/2015); Cataract extraction w/PHACO (Right, 07/12/2016); Esophagogastroduodenoscopy (egd) with propofol (N/A, 07/11/2017); Colonoscopy with propofol (N/A, 07/11/2017); and Cataract extraction w/PHACO (Left, 04/09/2019). Prior to Admission medications   Medication Sig Start Date End Date Taking? Authorizing Provider  acetaminophen (TYLENOL) 500 MG tablet Take 1,000 mg by mouth every 6 (six) hours as needed for  moderate pain or headache.   Yes [provider]  albuterol (VENTOLIN HFA) 108 (90 Base) MCG/ACT inhaler Inhale 2 puffs into the  lungs every 6 (six) hours as needed for wheezing or shortness of breath. 10/11/19  Yes Sreenath, Sudheer B, MD  amLODipine (NORVASC) 5 MG tablet Take 5 mg by mouth daily.    Yes [provider]  apixaban (ELIQUIS) 2.5 MG TABS tablet Take 2.5 mg by mouth 2 (two) times daily.    Yes [provider]  atorvastatin (LIPITOR) 80 MG tablet Take 80 mg by mouth daily.   Yes [provider]  Calcium Carb-Cholecalciferol (CALCIUM 600 + D PO) Take 1 tablet by mouth daily.    Yes [provider]  cloNIDine (CATAPRES) 0.1 MG tablet Take 0.1 mg by mouth 2 (two) times daily.   Yes [provider]  esomeprazole (NEXIUM) 20 MG capsule Take 20 mg by mouth at bedtime.   Yes [provider]  ferrous sulfate 325 (65 FE) MG tablet Take 325 mg by mouth daily with breakfast.   Yes [provider]  furosemide (LASIX) 20 MG tablet Take 20 mg by mouth daily.    Yes [provider]  insulin NPH Human (NOVOLIN N) 100 UNIT/ML injection Inject 15-20 Units into the skin See admin instructions. Inject 20u under the skin every morning and inject 15u under the skin ever evening   Yes [provider]  ipratropium-albuterol (DUONEB) 0.5-2.5 (3) MG/3ML SOLN Take 3 mLs by nebulization 3 (three) times daily. 10/11/19  Yes Sreenath, Sudheer B, MD  lisinopril (PRINIVIL,ZESTRIL) 10 MG tablet Take 10 mg by mouth daily.   Yes [provider]  metFORMIN (GLUCOPHAGE) 1000 MG tablet Take 1,000 mg by mouth 2 (two) times daily. 06/02/19  Yes [provider]  vitamin B-12 (CYANOCOBALAMIN) 100 MCG tablet Take 100 mcg by mouth daily.   Yes [provider]  alendronate (FOSAMAX) 70 MG tablet Take 70 mg by mouth once a week.     [provider]  cyanocobalamin (,VITAMIN B-12,) 1000 MCG/ML injection Inject  1,000 mcg into the muscle every 30 (thirty) days.     [provider]  methylPREDNISolone (MEDROL) 4 MG tablet Methylprednisolone taper: Day 1: take 20mg  (5 tabs) Day 2: Take 16mg  (4 tabs) Day 3: Take 12mg  (3 tabs) Day 4: Take 8mg  (2 tabs) Day 5: Take 4mg  (1 tab) Patient not taking: Reported on 01/15/2020 10/11/19   Ralene Muskrat B, MD  potassium chloride (K-DUR) 10 MEQ tablet Take 10 mEq by mouth daily.     [provider]   Allergies  Allergen Reactions  . Tramadol Anaphylaxis  . Aspirin     "Stomach problems"    FAMILY HISTORY:  family history is not on file. SOCIAL HISTORY:  reports that she has never smoked. She has never used smokeless tobacco. She reports that she does not drink alcohol.   COVID-19 DISASTER DECLARATION:  FULL CONTACT PHYSICAL EXAMINATION WAS NOT POSSIBLE DUE TO TREATMENT OF COVID-19 AND  CONSERVATION OF PERSONAL PROTECTIVE EQUIPMENT, LIMITED EXAM FINDINGS INCLUDE-  Patient assessed or the symptoms described in the history of present illness.  In the context of the Global COVID-19 pandemic, which necessitated consideration that the patient might be at risk for infection with the SARS-CoV-2 virus that causes COVID-19, Institutional protocols and algorithms that pertain to the evaluation of patients at risk for COVID-19 are in a state of rapid change based on information released by regulatory bodies including the CDC and federal and state organizations. These policies and algorithms were followed during the patient's care while in hospital.  REVIEW OF SYSTEMS:  Positives in  BOLD Constitutional: Negative for fever, chills, weight loss, malaise/fatigue, + generalized weakness and diaphoresis.  HENT: Negative for hearing loss, ear pain, nosebleeds, congestion, sore throat, neck pain, tinnitus and ear discharge.   Eyes: Negative for blurred vision, double vision, photophobia, pain, discharge and redness.  Respiratory: Negative for cough,  hemoptysis, sputum production, shortness of breath, wheezing and stridor.   Cardiovascular: Negative for chest pain, palpitations, orthopnea, claudication, leg swelling and PND.  Gastrointestinal: Negative for heartburn, nausea, vomiting, abdominal pain, diarrhea, constipation, blood in stool and melena.  Genitourinary: Negative for dysuria, urgency, frequency, hematuria and flank pain.  Musculoskeletal: Negative for myalgias, back pain, joint pain and falls.  Skin: Negative for itching and rash.  Neurological: Negative for dizziness, tingling, tremors, sensory change, speech change, focal weakness, seizures, loss of consciousness, weakness and headaches.  Endo/Heme/Allergies: Negative for environmental allergies and polydipsia. Does not bruise/bleed easily.  SUBJECTIVE:  -Pt reports generalized weakness -Denies chest pain, palpitations, lightheadedness, shortness of breath, cough, fever, chills, nausea, vomiting, abdominal pain, dysuria -Currently on 4 L nasal cannula  VITAL SIGNS: Temp:  [93.4 F (34.1 C)-97.5 F (36.4 C)] 97.4 F (36.3 C) (05/07 1956) Pulse Rate:  [44-60] 53 (05/07 1956) Resp:  [15-20] 19 (05/07 1956) BP: (92-131)/(19-48) 103/20 (05/07 1900) SpO2:  [87 %-94 %] 92 % (05/07 1956) Weight:  [87.1 kg] 87.1 kg (05/07 1130)  PHYSICAL EXAMINATION: General: Acute on chronically ill-appearing female, sitting in bed, on 4 L nasal cannula, no acute distress Neuro: Awake, alert and oriented x4, follows commands, no focal deficits, speech clear HEENT: Atraumatic, normocephalic, neck supple, no JVD Cardiovascular: Bradycardia, irregularly iregular rhythm, no murmurs, rubs, gallops Lungs: Coarse breath sounds to auscultation bilaterally, even, nonlabored, normal effort Abdomen: Obese, soft, nontender, nondistended, no guarding or rebound tenderness, bowel sounds positive x4 Musculoskeletal: Generalized weakness, 2+ edema bilateral upper extremities, no edema to lower  extremities Skin: Warm and dry.  No obvious rashes, lesions, ulcerations.  Scattered ecchymosis to bilateral upper extremities.  Recent Labs  Lab 01/15/20 1847 01/16/20 0513  NA 137 138  K 4.6 4.7  CL 109 111  CO2 15* 16*  BUN 67* 66*  CREATININE 5.16* 5.54*  GLUCOSE 105* 90   Recent Labs  Lab 01/15/20 1847 01/16/20 0513  HGB 9.3* 8.3*  HCT 31.5* 27.7*  WBC 7.7 7.8  PLT 244 203   US RENAL  Result Date: 01/16/2020 CLINICAL DATA:  85 year old female with a history of acute kidney injury EXAM: RENAL / URINARY TRACT ULTRASOUND COMPLETE COMPARISON:  04/03/2008 FINDINGS: Right Kidney: Length: 11.3 cm x 5.6 cm x 4.8 cm, 157 cc. No hydronephrosis. Flow confirmed in the hilum. Cystic structure with through transmission of the right upper pole cortex measures 6.1 cm x 3.0 cm x 3.4 cm. Left Kidney: Length: 12.6 cm x 5.0 cm x 5.9 cm, 193 cc. No hydronephrosis. Flow confirmed in the hilum. Cystic structure in the superior left kidney with through transmission and no internal complexity or flow measures 5.2 cm x 5.0 cm by 4.8 cm. Bladder: Appears normal for degree of bladder distention. IMPRESSION: Negative for hydronephrosis of the bilateral kidneys. Right-sided Bosniak 2 cyst and a left-sided Bosniak 1 cyst. Electronically Signed   By: Corrie Mckusick D.O.   On: 01/16/2020 09:21   DG Chest Portable 1 View  Result Date: 01/15/2020 CLINICAL DATA:  Shortness of breath and weakness. EXAM: PORTABLE CHEST 1 VIEW COMPARISON:  10/06/2019 FINDINGS: The cardiac silhouette remains borderline enlarged. Linear opacities are again demonstrated in the mid  and lower lung zones on the left, resolved in the upper lung zones. Interval mild patchy density at the right lung base. Mildly increased patchy density at the left lung base with possible small bilateral pleural effusions. Diffuse osteopenia. IMPRESSION: 1. Interval mild patchy atelectasis or pneumonia at the right lung base. 2. Mildly increased patchy atelectasis  or pneumonia at the left lung base. 3. Possible small bilateral pleural effusions. 4. Stable borderline cardiomegaly. Electronically Signed   By: Claudie Revering M.D.   On: 01/15/2020 19:12   ECHOCARDIOGRAM COMPLETE  Result Date: 01/16/2020    ECHOCARDIOGRAM REPORT   Patient Name:   Dorothy [name] Date of Exam: 01/16/2020 Medical Rec #:  [id number]       Height:       66.0 in Accession #:    [id number]      Weight:       193.3 lb Date of Birth:  June 21, 1935       BSA:          1.971 m Patient Age:    70 years        BP:           112/23 mmHg Patient Gender: F               HR:           54 bpm. Exam Location:  [facility] Procedure: 2D Echo, Color Doppler and Cardiac Doppler Indications:     I50.31 CHF-Acute Diastolic  History:         Patient has prior history of Echocardiogram examinations. COPD;                  Risk Factors:Sleep Apnea, Hypertension, Diabetes and                  Dyslipidemia.  Sonographer:     Charmayne Sheer RDCS (AE) Referring Phys:  [id number] Sidney Ace Diagnosing Phys: Serafina Royals MD  Sonographer Comments: No subcostal window and suboptimal apical window. Image acquisition challenging due to patient body habitus and Image acquisition challenging due to COPD. IMPRESSIONS  1. Left ventricular ejection fraction, by estimation, is 55 to 60%. The left ventricle has normal function. The left ventricle has no regional wall motion abnormalities. Left ventricular diastolic function could not be evaluated.  2. Right ventricular systolic function is normal. The right ventricular size is normal. There is mildly elevated pulmonary artery systolic pressure.  3. Left atrial size was mildly dilated.  4. Right atrial size was mildly dilated.  5. The mitral valve is normal in structure. Mild mitral valve regurgitation.  6. The aortic valve is normal in structure. Aortic valve regurgitation is trivial. FINDINGS  Left Ventricle: Left ventricular ejection fraction, by estimation, is 55 to 60%. The left ventricle has  normal function. The left ventricle has no regional wall motion abnormalities. The left ventricular internal cavity size was normal in size. There is  no left ventricular hypertrophy. Left ventricular diastolic function could not be evaluated. Right Ventricle: The right ventricular size is normal. No increase in right ventricular wall thickness. Right ventricular systolic function is normal. There is mildly elevated pulmonary artery systolic pressure. The tricuspid regurgitant velocity is 2.81  m/s, and with an assumed right atrial pressure of 10 mmHg, the estimated right ventricular systolic pressure is 17.0 mmHg. Left Atrium: Left atrial size was mildly dilated. Right Atrium: Right atrial size was mildly dilated. Pericardium: There is no evidence of pericardial effusion. Mitral Valve: The  mitral valve is normal in structure. Mild mitral valve regurgitation. MV peak gradient, 13.7 mmHg. The mean mitral valve gradient is 3.0 mmHg. Tricuspid Valve: The tricuspid valve is normal in structure. Tricuspid valve regurgitation is mild. Aortic Valve: The aortic valve is normal in structure. Aortic valve regurgitation is trivial. Aortic valve mean gradient measures 7.0 mmHg. Aortic valve peak gradient measures 17.3 mmHg. Aortic valve area, by VTI measures 2.07 cm. Pulmonic Valve: The pulmonic valve was normal in structure. Pulmonic valve regurgitation is not visualized. Aorta: The aortic root and ascending aorta are structurally normal, with no evidence of dilitation. IAS/Shunts: No atrial level shunt detected by color flow Doppler.  LEFT VENTRICLE PLAX 2D LVIDd:         4.47 cm  Diastology LVIDs:         2.53 cm  LV e' lateral:   6.74 cm/s LV PW:         1.04 cm  LV E/e' lateral: 25.4 LV IVS:        0.90 cm  LV e' medial:    5.22 cm/s LVOT diam:     1.90 cm  LV E/e' medial:  32.9 LV SV:         92 LV SV Index:   47 LVOT Area:     2.84 cm  RIGHT VENTRICLE RV Basal diam:  2.85 cm LEFT ATRIUM             Index       RIGHT  ATRIUM           Index LA diam:        5.30 cm 2.69 cm/m  RA Area:     15.30 cm LA Vol (A2C):   62.9 ml 31.91 ml/m RA Volume:   37.30 ml  18.92 ml/m LA Vol (A4C):   64.9 ml 32.92 ml/m LA Biplane Vol: 64.9 ml 32.92 ml/m  AORTIC VALVE                    PULMONIC VALVE AV Area (Vmax):    2.18 cm     PV Vmax:       1.42 m/s AV Area (Vmean):   2.08 cm     PV Vmean:      90.400 cm/s AV Area (VTI):     2.07 cm     PV VTI:        0.317 m AV Vmax:           208.00 cm/s  PV Peak grad:  8.1 mmHg AV Vmean:          125.000 cm/s PV Mean grad:  4.0 mmHg AV VTI:            0.445 m AV Peak Grad:      17.3 mmHg AV Mean Grad:      7.0 mmHg LVOT Vmax:         160.00 cm/s LVOT Vmean:        91.500 cm/s LVOT VTI:          0.325 m LVOT/AV VTI ratio: 0.73  AORTA Ao Root diam: 2.90 cm MITRAL VALVE                TRICUSPID VALVE MV Area (PHT): 2.31 cm     TR Peak grad:   31.6 mmHg MV Peak grad:  13.7 mmHg    TR Vmax:        281.00 cm/s MV Mean grad:  3.0 mmHg MV  Vmax:       1.85 m/s     SHUNTS MV Vmean:      62.1 cm/s    Systemic VTI:  0.32 m MV Decel Time: 329 msec     Systemic Diam: 1.90 cm MV E velocity: 171.50 cm/s Serafina Royals MD Electronically signed by Serafina Royals MD Signature Date/Time: 01/16/2020/3:52:32 PM    Final     ASSESSMENT / PLAN:  Septic shock secondary to multifocal pneumonia Symptomatic bradycardia Atrial fibrillation Hx: HTN, HLD, CHF, A-fib on Eliquis -Continuous cardiac monitor -Maintain MAP greater than 65 -Cautious IV fluids given AKI & CHF history -Levophed as needed to maintain MAP goal -Trend Lactic acid -Hold antihypertensives -Cardiology following, appreciate input  Multifocal pneumonia -Monitor fever curve -Trend WBCs and procalcitonin -Follow cultures as above -Continue ceftriaxone and doxycycline  AKI on CKD stage III -Monitor I&O's / urinary output -Follow BMP -Ensure adequate renal perfusion -Avoid nephrotoxic agents as able -Replace electrolytes as  indicated -Nephrology following, appreciate input -Bicarb drip -Follow-up BMP pending  Acute on chronic hypoxic respiratory failure secondary to multifocal pneumonia Hx: COPD on 2L supplemental O2, OSA -Supplemental O2 as needed to maintain O2 sats 88 to 92% -Patient is DNR/DNI -Follow intermittent chest x-ray and ABG as needed -Antibiotics as above -PRN Bronchodilators  Diabetes mellitus type 2 -CBGs -Sliding scale insulin -Follow ICU hypo/hyperglycemia protocol -Hold home Metformin  Anemia of chronic disease -Monitor for S/Sx of bleeding -Trend CBC -Home Eliquis for VTE Prophylaxis/Anticoagulation  -Transfuse for Hgb <7        Patient is critically ill, and prognosis is guarded.  Patient is high risk for cardiac arrest and death.  Best practices: Disposition: ICU Goals of care: DNR/DNI VTE prophylaxis/anticoagulation: Home Eliquis Consults: Nephrology, cardiology Updates: Updated patient at bedside 01/16/2020  Darel Hong, [facility] Pager: [phone]  01/16/2020, 9:36 PM

## 2020-01-16 NOTE — Progress Notes (Signed)
Bedside report given, this RN notified MD to assess patient as HR dropped as low as 30's and hypotension. Patient placed on bedside monitor [id number], verified with central telemetry. Patient A&Ox4,  MD to bedside and assessed patient. Lactic acid ordered, LR bolus ordered, PIV placed to R arm. Continue to assess and monitor.

## 2020-01-16 NOTE — Progress Notes (Signed)
PROGRESS NOTE    Dorothy [name]  TKP:[id number] DOB: [birth year] DOA: 01/15/2020 PCP: Letta Median, MD   Brief Narrative:  85 y.o. Caucasian female with a known history of CHF, COPD, type 2 diabetes mellitus, GERD, dyslipidemia, hypertension and obstructive sleep apnea, who presented to the emergency room with acute onset of generalized weakness with associated dyspnea and diminished p.o. intake over the last week.  She admitted to chills without measured fever as well as mild dyspnea and dry cough and occasional wheezing.  No chest pain or palpitations.  No nausea or vomiting or abdominal pain.  No dysuria, oliguria or hematuria or flank pain..  She has chronic respiratory failure on home O2 at 2 L/min.  Upon presentation to the emergency room, heart rate was 53 pulse extremity 91% on 2 L of O2 by nasal cannula and later 94-95% on 3.5 L of O2, with blood pressure initially normal and later 99/46 with otherwise normal vital signs.  BUN and creatinine were 67 and 5.16 compared to 43/1.4 on 10/10/19.  BNP was 256 and CBC showed anemia close to baseline.  Albumin was 3.3 and total protein 6.4.  Portable chest x-ray showed interval mild patchy atelectasis or pneumonia at the right lung base with mild increased patchy atelectasis or pneumonia at the left lung base and possible bilateral small pleural effusions as well as stable borderline cardiomegaly.  5/7: Patient seen and examined.  Persistent hypotension overnight necessitating multiple fluid boluses.  Also baseline bradycardia and atrial fibrillation noted with occasional decreases in heart rate down to the 20s and 30s.  Patient relatively asymptomatic.  Mentating clearly.  Reports feeling "a little funny" but unable to specify further.  No fevers noted.  Lactic acid normal.  Patient has been anuric since admission.  Creatinine markedly elevated.  Nephrology consult called.   Assessment & Plan:   Active Problems:   Multifocal  pneumonia  Multifocal pneumonia. Patient was started on IV Rocephin and Zithromax on admission No fevers noted no worsening signs of infection Plan: Continue IV Rocephin Continue IV azithromycin Sputum Gram stain culture Urinary pneumonia antigens Follow blood cultures, no growth to date Mucolytic's As needed duo nebs Oxygen as needed, wean as tolerated  Acute on chronic hypoxic respiratory failure secondary to pneumonia See above for acute management Wean oxygen as tolerated  Sepsis secondary to pneumonia without severe sepsis or septic shock. Manifested by hypothermia, hypotension, hypoxia Despite relatively low maps the patient appears to be perfusing with normal lactic acid Management as above  Acute kidney injury superimposed on stage IIIb chronic kidney disease Metabolic acidosis Patient received several liters crystalloid and colloid boluses without significant change in kidney function Creatinine actually worsened over interval Renal ultrasound no hydrobilateral renal cyst noted Nephrology consult called, case discussed with Dr. Candiss Norse, recommendations appreciated Plan: Decrease rate of IV fluids, bicarb No further albumin Cautious fluid administration We will use pressor support if necessary Patient may require hemodialysis during this admission if kidney function continues to worsen  Atrial fibrillation Symptomatic bradycardia Chronic anticoagulation Patient on low-dose Eliquis No rate control agents appreciated on home med rec Patient has been relatively bradycardic with baseline rate in the 50s with occasional dropped into the 30s Plan: Cardiology consultation.  Patient is established with Dr. Saralyn Pilar from [facility].  I have placed an order for a consultation with Dr.Kowalski.  I have sent him a secure check.  Response is pending In the meantime we will place pacer pads Avoid any AV nodal blocking  agents Continue Eliquis Check  echocardiogram  Dyslipidemia. Statin  Hypertension. All home antihypertensives on hold  Type 2 diabetes mellitus. Hold home Metformin Sliding scale insulin  GERD. PPI   DVT prophylaxis: Eliquis Code Status: Full Family Communication:  Disposition Plan: Status is: Inpatient  Remains inpatient appropriate because:Inpatient level of care appropriate due to severity of illness   Dispo: The patient is from: Home              Anticipated d/c is to: Home              Anticipated d/c date is: 3 days              Patient currently is not medically stable to d/c.   Still with relative hypotension, hypothermia consistent with sepsis.  Treatment with intravenous antibiotics.  Disposition plan pending.   Consultants:   Nephrology- [facility]  Cardiology-[facility]  Procedures:   none  Antimicrobials:   Rocephin (01/15/20-  )  Azithromycin (01/15/20-  )   Subjective: Seen and examined Mentating clearly No pain complaints  Objective: Vitals:   01/16/20 1045 01/16/20 1100 01/16/20 1115 01/16/20 1130  BP: (!) 112/25 (!) 115/31 (!) 110/33 (!) 122/24  Pulse: (!) 49 (!) 53 (!) 55 (!) 55  Resp:      Temp:      TempSrc:      SpO2: 92% (!) 89% (!) 87% 91%  Weight:    87.1 kg  Height:        Intake/Output Summary (Last 24 hours) at 01/16/2020 1313 Last data filed at 01/16/2020 0730 Gross per 24 hour  Intake 1687.16 ml  Output 0 ml  Net 1687.16 ml   Filed Weights   01/15/20 1841 01/16/20 1130  Weight: 87.7 kg 87.1 kg    Examination:  General exam: Appears calm and comfortable, appears frail Respiratory system: Mild bibasilar crackles.  Normal work of breathing Cardiovascular system: S1 & S2 heard, RRR. No JVD, murmurs, rubs, gallops or clicks. No pedal edema. Gastrointestinal system: Abdomen is nondistended, soft and nontender. No organomegaly or masses felt. Normal bowel sounds heard. Central nervous system: Alert and oriented. No focal neurological  deficits. Extremities: Symmetric 5 x 5 power. Skin: No rashes, lesions or ulcers Psychiatry: Judgement and insight appear normal. Mood & affect appropriate.     Data Reviewed: I have personally reviewed following labs and imaging studies  CBC: Recent Labs  Lab 01/15/20 1847 01/16/20 0513  WBC 7.7 7.8  NEUTROABS 7.0  --   HGB 9.3* 8.3*  HCT 31.5* 27.7*  MCV 87.3 86.6  PLT 244 413   Basic Metabolic Panel: Recent Labs  Lab 01/15/20 1847 01/16/20 0513  NA 137 138  K 4.6 4.7  CL 109 111  CO2 15* 16*  GLUCOSE 105* 90  BUN 67* 66*  CREATININE 5.16* 5.54*  CALCIUM 8.3* 7.7*   GFR: Estimated Creatinine Clearance: 8.7 mL/min (A) (by C-G formula based on SCr of 5.54 mg/dL (H)). Liver Function Tests: Recent Labs  Lab 01/15/20 1847  AST 15  ALT 15  ALKPHOS 79  BILITOT 0.9  PROT 6.4*  ALBUMIN 3.3*   No results for input(s): LIPASE, AMYLASE in the last 168 hours. No results for input(s): AMMONIA in the last 168 hours. Coagulation Profile: No results for input(s): INR, PROTIME in the last 168 hours. Cardiac Enzymes: No results for input(s): CKTOTAL, CKMB, CKMBINDEX, TROPONINI in the last 168 hours. BNP (last 3 results) No results for input(s): PROBNP in the last  8760 hours. HbA1C: Recent Labs    01/16/20 0513  HGBA1C 6.4*   CBG: Recent Labs  Lab 01/16/20 0438 01/16/20 0754 01/16/20 1145  GLUCAP 82 72 97   Lipid Profile: No results for input(s): CHOL, HDL, LDLCALC, TRIG, CHOLHDL, LDLDIRECT in the last 72 hours. Thyroid Function Tests: No results for input(s): TSH, T4TOTAL, FREET4, T3FREE, THYROIDAB in the last 72 hours. Anemia Panel: No results for input(s): VITAMINB12, FOLATE, FERRITIN, TIBC, IRON, RETICCTPCT in the last 72 hours. Sepsis Labs: Recent Labs  Lab 01/15/20 1946 01/15/20 2045 01/16/20 0805 01/16/20 1121  PROCALCITON 0.17  --   --   --   LATICACIDVEN  --  1.1 0.7 0.7    Recent Results (from the past 240 hour(s))  Respiratory Panel by  RT PCR (Flu A&B, Covid) - Nasopharyngeal Swab     Status: None   Collection Time: 01/15/20  7:38 PM   Specimen: Nasopharyngeal Swab  Result Value Ref Range Status   SARS Coronavirus 2 by RT PCR NEGATIVE NEGATIVE Final    Comment: (NOTE) SARS-CoV-2 target nucleic acids are NOT DETECTED. The SARS-CoV-2 RNA is generally detectable in upper respiratoy specimens during the acute phase of infection. The lowest concentration of SARS-CoV-2 viral copies this assay can detect is 131 copies/mL. A negative result does not preclude SARS-Cov-2 infection and should not be used as the sole basis for treatment or other patient management decisions. A negative result may occur with  improper specimen collection/handling, submission of specimen other than nasopharyngeal swab, presence of viral mutation(s) within the areas targeted by this assay, and inadequate number of viral copies (<131 copies/mL). A negative result must be combined with clinical observations, patient history, and epidemiological information. The expected result is Negative. Fact Sheet for Patients:  PinkCheek.be Fact Sheet for Healthcare Providers:  GravelBags.it This test is not yet ap proved or cleared by the [place] FDA and  has been authorized for detection and/or diagnosis of SARS-CoV-2 by FDA under an Emergency Use Authorization (EUA). This EUA will remain  in effect (meaning this test can be used) for the duration of the COVID-19 declaration under Section 564(b)(1) of the Act, 21 U.S.C. section 360bbb-3(b)(1), unless the authorization is terminated or revoked sooner.    Influenza A by PCR NEGATIVE NEGATIVE Final   Influenza B by PCR NEGATIVE NEGATIVE Final    Comment: (NOTE) The Xpert Xpress SARS-CoV-2/FLU/RSV assay is intended as an aid in  the diagnosis of influenza from Nasopharyngeal swab specimens and  should not be used as a sole basis for treatment.  Nasal washings and  aspirates are unacceptable for Xpert Xpress SARS-CoV-2/FLU/RSV  testing. Fact Sheet for Patients: PinkCheek.be Fact Sheet for Healthcare Providers: GravelBags.it This test is not yet approved or cleared by the [place] FDA and  has been authorized for detection and/or diagnosis of SARS-CoV-2 by  FDA under an Emergency Use Authorization (EUA). This EUA will remain  in effect (meaning this test can be used) for the duration of the  Covid-19 declaration under Section 564(b)(1) of the Act, 21  U.S.C. section 360bbb-3(b)(1), unless the authorization is  terminated or revoked. Performed at [facility], [address] [place], [place] [address]   Blood Culture (routine x 2)     Status: None (Preliminary result)   Collection Time: 01/15/20  7:46 PM   Specimen: BLOOD RIGHT WRIST  Result Value Ref Range Status   Specimen Description BLOOD RIGHT WRIST  Final   Special Requests BOTTLES DRAWN AEROBIC AND  ANAEROBIC  Final   Culture   Final    NO GROWTH < 12 HOURS Performed at [facility], [address] [place], [place] [address]    Report Status PENDING  Incomplete         Radiology Studies: US RENAL  Result Date: 01/16/2020 CLINICAL DATA:  85 year old female with a history of acute kidney injury EXAM: RENAL / URINARY TRACT ULTRASOUND COMPLETE COMPARISON:  04/03/2008 FINDINGS: Right Kidney: Length: 11.3 cm x 5.6 cm x 4.8 cm, 157 cc. No hydronephrosis. Flow confirmed in the hilum. Cystic structure with through transmission of the right upper pole cortex measures 6.1 cm x 3.0 cm x 3.4 cm. Left Kidney: Length: 12.6 cm x 5.0 cm x 5.9 cm, 193 cc. No hydronephrosis. Flow confirmed in the hilum. Cystic structure in the superior left kidney with through transmission and no internal complexity or flow measures 5.2 cm x 5.0 cm by 4.8 cm. Bladder: Appears normal for degree of bladder distention.  IMPRESSION: Negative for hydronephrosis of the bilateral kidneys. Right-sided Bosniak 2 cyst and a left-sided Bosniak 1 cyst. Electronically Signed   By: Corrie Mckusick D.O.   On: 01/16/2020 09:21   DG Chest Portable 1 View  Result Date: 01/15/2020 CLINICAL DATA:  Shortness of breath and weakness. EXAM: PORTABLE CHEST 1 VIEW COMPARISON:  10/06/2019 FINDINGS: The cardiac silhouette remains borderline enlarged. Linear opacities are again demonstrated in the mid and lower lung zones on the left, resolved in the upper lung zones. Interval mild patchy density at the right lung base. Mildly increased patchy density at the left lung base with possible small bilateral pleural effusions. Diffuse osteopenia. IMPRESSION: 1. Interval mild patchy atelectasis or pneumonia at the right lung base. 2. Mildly increased patchy atelectasis or pneumonia at the left lung base. 3. Possible small bilateral pleural effusions. 4. Stable borderline cardiomegaly. Electronically Signed   By: Claudie Revering M.D.   On: 01/15/2020 19:12        Scheduled Meds: . amLODipine  5 mg Oral Daily  . apixaban  2.5 mg Oral BID  . atorvastatin  80 mg Oral Daily  . calcium-vitamin D  1 tablet Oral Daily  . [START ON 01/19/2020] cyanocobalamin  1,000 mcg Intramuscular Q30 days  . ferrous sulfate  325 mg Oral Q breakfast  . guaiFENesin  600 mg Oral BID  . insulin aspart  0-9 Units Subcutaneous TID PC & HS  . pantoprazole  40 mg Oral Daily   Continuous Infusions: . cefTRIAXone (ROCEPHIN)  IV    . doxycycline (VIBRAMYCIN) IV 100 mg (01/16/20 6283)  . sodium bicarbonate 150 mEq in dextrose 5% 1000 mL 50 mL/hr at 01/16/20 1037     LOS: 1 day    Time spent: 35 minutes    Sidney Ace, MD [facility] Pager [phone]  If 7PM-7AM, please contact night-coverage 01/16/2020, 1:13 PM

## 2020-01-16 NOTE — Consult Note (Signed)
[address] [place], [place] [address] Phone [phone]. Fax [phone]  Date: 01/16/2020                  Patient Name:  Dorothy [name]  MRN: [id number]  DOB: Jun 12, 1935  Age / Sex: 85 y.o., female         PCP: Letta Median, MD                 Service Requesting Consult: IM/ Sidney Ace, MD                 Reason for Consult: ARF            History of Present Illness: Patient is a 85 y.o. female  was admitted to [facility] on 01/15/2020 for evaluation of Acute renal failure, unspecified acute renal failure type ([place]) [N17.9] Multifocal pneumonia [J18.9]  Patient presented to the emergency room for weakness for about a week or so.  She has generalized malaise, some shortness of breath and decreased p.o. intake.  Chronic 2 L nasal cannula oxygen supplementation Today she reports low appetite Decreased UOP  Medications: Outpatient medications: Medications Prior to Admission  Medication Sig Dispense Refill Last Dose  . acetaminophen (TYLENOL) 500 MG tablet Take 1,000 mg by mouth every 6 (six) hours as needed for moderate pain or headache.   prn at prn  . albuterol (VENTOLIN HFA) 108 (90 Base) MCG/ACT inhaler Inhale 2 puffs into the lungs every 6 (six) hours as needed for wheezing or shortness of breath. 6.7 g 3 prn at prn  . amLODipine (NORVASC) 5 MG tablet Take 5 mg by mouth daily.    01/14/2020 at Unknown time  . apixaban (ELIQUIS) 2.5 MG TABS tablet Take 2.5 mg by mouth 2 (two) times daily.    01/14/2020 at Unknown time  . atorvastatin (LIPITOR) 80 MG tablet Take 80 mg by mouth daily.   01/14/2020 at Unknown time  . Calcium Carb-Cholecalciferol (CALCIUM 600 + D PO) Take 1 tablet by mouth daily.    01/15/2020 at Unknown time  . cloNIDine (CATAPRES) 0.1 MG tablet Take 0.1 mg by mouth 2 (two) times daily.   01/14/2020 at Unknown time  . esomeprazole (NEXIUM) 20 MG capsule Take 20 mg by mouth at bedtime.   01/14/2020 at Unknown time  . ferrous sulfate 325 (65 FE) MG  tablet Take 325 mg by mouth daily with breakfast.   01/14/2020 at Unknown time  . furosemide (LASIX) 20 MG tablet Take 20 mg by mouth daily.    01/14/2020 at Unknown time  . insulin NPH Human (NOVOLIN N) 100 UNIT/ML injection Inject 15-20 Units into the skin See admin instructions. Inject 20u under the skin every morning and inject 15u under the skin ever evening   01/14/2020 at Unknown time  . ipratropium-albuterol (DUONEB) 0.5-2.5 (3) MG/3ML SOLN Take 3 mLs by nebulization 3 (three) times daily. 360 mL 3 prn at prn  . lisinopril (PRINIVIL,ZESTRIL) 10 MG tablet Take 10 mg by mouth daily.   01/14/2020 at Unknown time  . metFORMIN (GLUCOPHAGE) 1000 MG tablet Take 1,000 mg by mouth 2 (two) times daily.   01/14/2020 at Unknown time  . vitamin B-12 (CYANOCOBALAMIN) 100 MCG tablet Take 100 mcg by mouth daily.   01/14/2020 at Unknown time  . alendronate (FOSAMAX) 70 MG tablet Take 70 mg by mouth once a week.    Not Taking at Unknown time  . cyanocobalamin (,VITAMIN B-12,) 1000 MCG/ML injection Inject 1,000 mcg  into the muscle every 30 (thirty) days.    Not Taking at Unknown time  . methylPREDNISolone (MEDROL) 4 MG tablet Methylprednisolone taper: Day 1: take 20mg  (5 tabs) Day 2: Take 16mg  (4 tabs) Day 3: Take 12mg  (3 tabs) Day 4: Take 8mg  (2 tabs) Day 5: Take 4mg  (1 tab) (Patient not taking: Reported on 01/15/2020) 15 tablet 0 Not Taking at Unknown time  . potassium chloride (K-DUR) 10 MEQ tablet Take 10 mEq by mouth daily.    Not Taking at Unknown time    Current medications: Current Facility-Administered Medications  Medication Dose Route Frequency Provider Last Rate Last Admin  . acetaminophen (TYLENOL) tablet 650 mg  650 mg Oral Q6H PRN [name], Jan A, MD       Or  . acetaminophen (TYLENOL) suppository 650 mg  650 mg Rectal Q6H PRN [name], Jan A, MD      . albuterol (PROVENTIL) (2.5 MG/3ML) 0.083% nebulizer solution 2.5 mg  2.5 mg Inhalation Q6H PRN [name], Jan A, MD      . amLODipine (NORVASC) tablet 5 mg  5 mg  Oral Daily [name], Arvella Merles, MD   Stopped at 01/16/20 0813  . apixaban (ELIQUIS) tablet 2.5 mg  2.5 mg Oral BID [name], Jan A, MD   2.5 mg at 01/16/20 0902  . atorvastatin (LIPITOR) tablet 80 mg  80 mg Oral Daily [name], Jan A, MD   80 mg at 01/16/20 0027  . calcium-vitamin D (OSCAL WITH D) 500-200 MG-UNIT per tablet 1 tablet  1 tablet Oral Daily [name], Jan A, MD   1 tablet at 01/16/20 0902  . cefTRIAXone (ROCEPHIN) 2 g in sodium chloride 0.9 % 100 mL IVPB  2 g Intravenous Q24H [name], Jan A, MD      . Derrill Memo ON 01/19/2020] cyanocobalamin ((VITAMIN B-12)) injection 1,000 mcg  1,000 mcg Intramuscular Q30 days [name], Jan A, MD      . doxycycline (VIBRAMYCIN) 100 mg in sodium chloride 0.9 % 250 mL IVPB  100 mg Intravenous Q12H Sharion Settler, NP 125 mL/hr at 01/16/20 0643 100 mg at 01/16/20 0643  . ferrous sulfate tablet 325 mg  325 mg Oral Q breakfast [name], Jan A, MD   325 mg at 01/16/20 0902  . guaiFENesin (MUCINEX) 12 hr tablet 600 mg  600 mg Oral BID [name], Jan A, MD   600 mg at 01/16/20 0902  . insulin aspart (novoLOG) injection 0-9 Units  0-9 Units Subcutaneous TID PC & HS [name], Jan A, MD      . ondansetron [facility]) tablet 4 mg  4 mg Oral Q6H PRN [name], Jan A, MD       Or  . ondansetron [facility]) injection 4 mg  4 mg Intravenous Q6H PRN [name], Jan A, MD      . pantoprazole (PROTONIX) EC tablet 40 mg  40 mg Oral Daily [name], Jan A, MD   40 mg at 01/16/20 0902  . sodium bicarbonate 150 mEq in dextrose 5% 1000 mL infusion  150 mEq Intravenous Continuous Sharion Settler, NP 100 mL/hr at 01/16/20 0707 150 mEq at 01/16/20 0707  . traZODone (DESYREL) tablet 25 mg  25 mg Oral QHS PRN [name], Arvella Merles, MD          Allergies: Allergies  Allergen Reactions  . Tramadol Anaphylaxis  . Aspirin     "Stomach problems"      Past Medical History: Past Medical History:  Diagnosis Date  . Anemia   . Arthritis  hands, knees, back  . Atrial fibrillation ([place])   . CHF (congestive heart failure) ([place])   .  COPD (chronic obstructive pulmonary disease) ([place])   . Diabetes mellitus without complication ([place])   . Dyspnea   . GERD (gastroesophageal reflux disease)   . Hyperlipidemia   . Hypertension   . PONV (postoperative nausea and vomiting)   . Sleep apnea    told she needs CPAP, does not have  . Wears dentures    full upper and lower.  only wears upper     Past Surgical History: Past Surgical History:  Procedure Laterality Date  . ABDOMINAL HYSTERECTOMY    . CATARACT EXTRACTION W/PHACO Right 07/12/2016   Procedure: CATARACT EXTRACTION PHACO AND INTRAOCULAR LENS PLACEMENT (IOC);  Surgeon: Leandrew Koyanagi, MD;  Location: [facility];  Service: Ophthalmology;  Laterality: Right;  DIABETIC - insulin and oral meds sleep apnea  . CATARACT EXTRACTION W/PHACO Left 04/09/2019   Procedure: CATARACT EXTRACTION PHACO AND INTRAOCULAR LENS PLACEMENT ([place])  LEFT DIABETIC;  Surgeon: Leandrew Koyanagi, MD;  Location: [facility];  Service: Ophthalmology;  Laterality: Left;  Diabetic - insulin and oral meds  . CHOLECYSTECTOMY    . COLONOSCOPY N/A 01/19/2015   Procedure: COLONOSCOPY;  Surgeon: Josefine Class, MD;  Location: [facility] ENDOSCOPY;  Service: Endoscopy;  Laterality: N/A;  . COLONOSCOPY WITH PROPOFOL N/A 07/11/2017   Procedure: COLONOSCOPY WITH PROPOFOL;  Surgeon: Toledo, Benay Pike, MD;  Location: [facility] ENDOSCOPY;  Service: Gastroenterology;  Laterality: N/A;  . ESOPHAGOGASTRODUODENOSCOPY N/A 01/19/2015   Procedure: ESOPHAGOGASTRODUODENOSCOPY (EGD);  Surgeon: Josefine Class, MD;  Location: [facility] ENDOSCOPY;  Service: Endoscopy;  Laterality: N/A;  . ESOPHAGOGASTRODUODENOSCOPY (EGD) WITH PROPOFOL N/A 07/11/2017   Procedure: ESOPHAGOGASTRODUODENOSCOPY (EGD) WITH PROPOFOL;  Surgeon: Toledo, Benay Pike, MD;  Location: [facility] ENDOSCOPY;  Service: Gastroenterology;  Laterality: N/A;  . SHOULDER ARTHROSCOPY       Family History: No family history on file.   Social  History: Social History   Socioeconomic History  . Marital status: Widowed    Spouse name: Not on file  . Number of children: Not on file  . Years of education: Not on file  . Highest education level: Not on file  Occupational History  . Not on file  Tobacco Use  . Smoking status: Never Smoker  . Smokeless tobacco: Never Used  Substance and Sexual Activity  . Alcohol use: No  . Drug use: Not on file  . Sexual activity: Not on file  Other Topics Concern  . Not on file  Social History Narrative  . Not on file   Social Determinants of Health   Financial Resource Strain:   . Difficulty of Paying Living Expenses:   Food Insecurity:   . Worried About Charity fundraiser in the Last Year:   . Arboriculturist in the Last Year:   Transportation Needs:   . Film/video editor (Medical):   Marland Kitchen Lack of Transportation (Non-Medical):   Physical Activity:   . Days of Exercise per Week:   . Minutes of Exercise per Session:   Stress:   . Feeling of Stress :   Social Connections:   . Frequency of Communication with Friends and Family:   . Frequency of Social Gatherings with Friends and Family:   . Attends Religious Services:   . Active Member of Clubs or Organizations:   . Attends Archivist Meetings:   Marland Kitchen Marital Status:   Intimate Partner Violence:   .  Fear of Current or Ex-Partner:   . Emotionally Abused:   Marland Kitchen Physically Abused:   . Sexually Abused:      Review of Systems: Gen: no fever, chills HEENT: no vision or hearing c/o CV: no chest pain or SOB Resp: no cough, no sputum, no hemoptysis GI: poor appetite, no diarrhea or vomiting GU : decreased UOP. No hematuria MS: able to walk at home Derm:   no rashes Psych: no c/o Heme: no c/o Neuro: no c/o Endocrine. No c/o   Vital Signs: Blood pressure (!) 129/28, pulse (!) 55, temperature (!) 97.4 F (36.3 C), temperature source Oral, resp. rate 16, height 5\' 6"  (1.676 m), weight 87.7 kg, SpO2 (!) 89  %.   Intake/Output Summary (Last 24 hours) at 01/16/2020 9371 Last data filed at 01/16/2020 0630 Gross per 24 hour  Intake 1687.16 ml  Output --  Net 1687.16 ml    Weight trends: [employer]   01/15/20 1841  Weight: 87.7 kg    Physical Exam: General:  Elderly, NAD, laying in bed  HEENT Dry mouth, anicteric  Neck:  supple  Lungs: Crackles at bases, left > right  Heart::  irregular, bradycardia  Abdomen: Soft, non tender  Extremities:  + edema b/l  Neurologic: Alert, able to answer questions  Skin:  no acute rashes  Access:   Foley: External catheter       Lab results: Basic Metabolic Panel: Recent Labs  Lab 01/15/20 1847 01/16/20 0513  NA 137 138  K 4.6 4.7  CL 109 111  CO2 15* 16*  GLUCOSE 105* 90  BUN 67* 66*  CREATININE 5.16* 5.54*  CALCIUM 8.3* 7.7*    Liver Function Tests: Recent Labs  Lab 01/15/20 1847  AST 15  ALT 15  ALKPHOS 79  BILITOT 0.9  PROT 6.4*  ALBUMIN 3.3*   No results for input(s): LIPASE, AMYLASE in the last 168 hours. No results for input(s): AMMONIA in the last 168 hours.  CBC: Recent Labs  Lab 01/15/20 1847 01/16/20 0513  WBC 7.7 7.8  NEUTROABS 7.0  --   HGB 9.3* 8.3*  HCT 31.5* 27.7*  MCV 87.3 86.6  PLT 244 203    Cardiac Enzymes: No results for input(s): CKTOTAL, TROPONINI in the last 168 hours.  BNP: Invalid input(s): POCBNP  CBG: Recent Labs  Lab 01/16/20 0438 01/16/20 0754  GLUCAP 82 72    Microbiology: Recent Results (from the past 720 hour(s))  Respiratory Panel by RT PCR (Flu A&B, Covid) - Nasopharyngeal Swab     Status: None   Collection Time: 01/15/20  7:38 PM   Specimen: Nasopharyngeal Swab  Result Value Ref Range Status   SARS Coronavirus 2 by RT PCR NEGATIVE NEGATIVE Final    Comment: (NOTE) SARS-CoV-2 target nucleic acids are NOT DETECTED. The SARS-CoV-2 RNA is generally detectable in upper respiratoy specimens during the acute phase of infection. The lowest concentration of SARS-CoV-2  viral copies this assay can detect is 131 copies/mL. A negative result does not preclude SARS-Cov-2 infection and should not be used as the sole basis for treatment or other patient management decisions. A negative result may occur with  improper specimen collection/handling, submission of specimen other than nasopharyngeal swab, presence of viral mutation(s) within the areas targeted by this assay, and inadequate number of viral copies (<131 copies/mL). A negative result must be combined with clinical observations, patient history, and epidemiological information. The expected result is Negative. Fact Sheet for Patients:  PinkCheek.be Fact Sheet for Healthcare  Providers:  GravelBags.it This test is not yet ap proved or cleared by the [place] and  has been authorized for detection and/or diagnosis of SARS-CoV-2 by FDA under an Emergency Use Authorization (EUA). This EUA will remain  in effect (meaning this test can be used) for the duration of the COVID-19 declaration under Section 564(b)(1) of the Act, 21 U.S.C. section 360bbb-3(b)(1), unless the authorization is terminated or revoked sooner.    Influenza A by PCR NEGATIVE NEGATIVE Final   Influenza B by PCR NEGATIVE NEGATIVE Final    Comment: (NOTE) The Xpert Xpress SARS-CoV-2/FLU/RSV assay is intended as an aid in  the diagnosis of influenza from Nasopharyngeal swab specimens and  should not be used as a sole basis for treatment. Nasal washings and  aspirates are unacceptable for Xpert Xpress SARS-CoV-2/FLU/RSV  testing. Fact Sheet for Patients: PinkCheek.be Fact Sheet for Healthcare Providers: GravelBags.it This test is not yet approved or cleared by the [place] FDA and  has been authorized for detection and/or diagnosis of SARS-CoV-2 by  FDA under an Emergency Use Authorization (EUA). This EUA will  remain  in effect (meaning this test can be used) for the duration of the  Covid-19 declaration under Section 564(b)(1) of the Act, 21  U.S.C. section 360bbb-3(b)(1), unless the authorization is  terminated or revoked. Performed at [facility], [address] [place], [place] [address]   Blood Culture (routine x 2)     Status: None (Preliminary result)   Collection Time: 01/15/20  7:46 PM   Specimen: BLOOD RIGHT WRIST  Result Value Ref Range Status   Specimen Description BLOOD RIGHT WRIST  Final   Special Requests BOTTLES DRAWN AEROBIC AND ANAEROBIC  Final   Culture   Final    NO GROWTH < 12 HOURS Performed at [facility], [address] [place], [place] [address]    Report Status PENDING  Incomplete     Coagulation Studies: No results for input(s): LABPROT, INR in the last 72 hours.  Urinalysis: No results for input(s): COLORURINE, LABSPEC, PHURINE, GLUCOSEU, HGBUR, BILIRUBINUR, KETONESUR, PROTEINUR, UROBILINOGEN, NITRITE, LEUKOCYTESUR in the last 72 hours.  Invalid input(s): APPERANCEUR      Imaging: DG Chest Portable 1 View  Result Date: 01/15/2020 CLINICAL DATA:  Shortness of breath and weakness. EXAM: PORTABLE CHEST 1 VIEW COMPARISON:  10/06/2019 FINDINGS: The cardiac silhouette remains borderline enlarged. Linear opacities are again demonstrated in the mid and lower lung zones on the left, resolved in the upper lung zones. Interval mild patchy density at the right lung base. Mildly increased patchy density at the left lung base with possible small bilateral pleural effusions. Diffuse osteopenia. IMPRESSION: 1. Interval mild patchy atelectasis or pneumonia at the right lung base. 2. Mildly increased patchy atelectasis or pneumonia at the left lung base. 3. Possible small bilateral pleural effusions. 4. Stable borderline cardiomegaly. Electronically Signed   By: Claudie Revering M.D.   On: 01/15/2020 19:12      Assessment & Plan: Pt is a 86 y.o.  Caucasian  female with  Atrial fibrillation Congestive heart failure COPD Diabetes GERD Hypertension Sleep apnea  was admitted on 01/15/2020 with Acute renal failure, unspecified acute renal failure type ([place]) [N17.9] Multifocal pneumonia [J18.9]  #.  Acute kidney injury on chronic kidney disease stage IIIb Baseline creatinine of 1.40/GFR 35 from October 10, 2019 Admit creatinine of 5.16 which has worsened to 5.54 today Renal ultrasound negative for hydronephrosis but bilateral cysts noted Patient has decreased UOP  -  Decrease iv maintenance fluids (bicarb to 50 cc/hr) - d/c saline - avoid iv albumin -avoid hypotension - consider pressor support - Electrolytes and Volume status are acceptable No acute indication for Dialysis at present - discussed with patient that if renal function continues to worsen or she develops pneumonia, she may need HD this admission    #Sepsis and pneumonia Treated with broad-spectrum antibiotics  #Diabetes with CKD Lab Results  Component Value Date   HGBA1C 6.4 (H) 10/07/2019   # Bradycardia A fib Recommend cardiology evaluation    LOS: 1 Tiffani Kadow 5/7/20219:22 AM    Note: This note was prepared with Dragon dictation. Any transcription errors are unintentional

## 2020-01-16 NOTE — Progress Notes (Signed)
Patient continues to be hypotensive and bradycardic without any urine output. Bladder scan 57 cc.  Repeating chemistries and added TSH Patient A & O  Reports still feeling weak Reports breathing feels ok but better with oxygen in place. Oxygen saturations 90-92% during assessment, decreased to 88% while completing note but remains without distress.  Sats improved with deep breathing and reposition of sensor  Transferring patient to ICU for pressor therapy Discussed with Darel Hong NP

## 2020-01-16 NOTE — Progress Notes (Signed)
Cross cover brief note Patient with hypotension not improved after albumin  Am labs reveal severe metabolic acidosis with acute renal failure 2 amp sodium bicar ordered in addition to continuous bicarb fluids

## 2020-01-16 NOTE — Progress Notes (Signed)
Notified by CCMD of 5 sec pause and HR dropping into 20's. Upon assessment of patient HR back into 50's. Patient is A&Ox4. MD notified. Defib pads placed on patient as precautionary measure, charge nurse aware. Cardiology consult ordered.

## 2020-01-16 NOTE — Consult Note (Signed)
[facility] Consultation Note  Patient ID: Dorothy [name], MRN: [id number], DOB/AGE: [birth year] 83 y.o. Admit date: 01/15/2020   Date of Consult: 01/16/2020 Primary Physician: Letta Median, MD Primary Cardiologist: Paraschos  Chief Complaint:  Chief Complaint  Patient presents with  . Weakness   Reason for Consult: Weakness atrial fibrillation  HPI: 85 y.o. female with known chronic nonvalvular atrial fibrillation chronic obstructive pulmonary disease diabetes hypertension hyperlipidemia sleep apnea who has had known good management of current issues.  The patient has not had any medication management for heart rate control of atrial fibrillation but has had blood pressure control with amlodipine and clonidine which clonidine may be causing some bradycardia.  The patient has had known anticoagulation with no evidence of significant side effects although the patient is anemic with a hemoglobin of 8.3.  She is receiving iron infusions which may be causing some symptoms.  She does have diabetes with chronic kidney disease stage V with a glomerular filtration rate of 8.  Currently there is some concerns that the patient has severe weakness fatigue and unable to eat.  She has had a chest x-ray consistent with possible pneumonia and will receive treatment for each.  During this hospitalization she has had some slow heart rates into the 30 bpm range which could be causing some symptoms.  There is currently no evidence of congestive heart failure and/or myocardial infarction  Past Medical History:  Diagnosis Date  . Anemia   . Arthritis    hands, knees, back  . Atrial fibrillation ([place])   . CHF (congestive heart failure) ([place])   . COPD (chronic obstructive pulmonary disease) ([place])   . Diabetes mellitus without complication ([place])   . Dyspnea   . GERD (gastroesophageal reflux disease)   . Hyperlipidemia   . Hypertension   . PONV (postoperative nausea and vomiting)   . Sleep  apnea    told she needs CPAP, does not have  . Wears dentures    full upper and lower.  only wears upper      Surgical History:  Past Surgical History:  Procedure Laterality Date  . ABDOMINAL HYSTERECTOMY    . CATARACT EXTRACTION W/PHACO Right 07/12/2016   Procedure: CATARACT EXTRACTION PHACO AND INTRAOCULAR LENS PLACEMENT (IOC);  Surgeon: Leandrew Koyanagi, MD;  Location: [place];  Service: Ophthalmology;  Laterality: Right;  DIABETIC - insulin and oral meds sleep apnea  . CATARACT EXTRACTION W/PHACO Left 04/09/2019   Procedure: CATARACT EXTRACTION PHACO AND INTRAOCULAR LENS PLACEMENT ([place])  LEFT DIABETIC;  Surgeon: Leandrew Koyanagi, MD;  Location: [place];  Service: Ophthalmology;  Laterality: Left;  Diabetic - insulin and oral meds  . CHOLECYSTECTOMY    . COLONOSCOPY N/A 01/19/2015   Procedure: COLONOSCOPY;  Surgeon: Josefine Class, MD;  Location[facility] ENDOSCOPY;  Service: Endoscopy;  Laterality: N/A;  . COLONOSCOPY WITH PROPOFOL N/A 07/11/2017   Procedure: COLONOSCOPY WITH PROPOFOL;  Surgeon: Toledo, Benay Pike, MD;  Location: [facility] ENDOSCOPY;  Service: Gastroenterology;  Laterality: N/A;  . ESOPHAGOGASTRODUODENOSCOPY N/A 01/19/2015   Procedure: ESOPHAGOGASTRODUODENOSCOPY (EGD);  Surgeon: Josefine Class, MD;  Location[facility] ENDOSCOPY;  Service: Endoscopy;  Laterality: N/A;  . ESOPHAGOGASTRODUODENOSCOPY (EGD) WITH PROPOFOL N/A 07/11/2017   Procedure: ESOPHAGOGASTRODUODENOSCOPY (EGD) WITH PROPOFOL;  Surgeon: Toledo, Benay Pike, MD;  Location: [facility] ENDOSCOPY;  Service: Gastroenterology;  Laterality: N/A;  . SHOULDER ARTHROSCOPY       Home Meds: Prior to Admission medications   Medication Sig Start Date End Date Taking? Authorizing  Provider  acetaminophen (TYLENOL) 500 MG tablet Take 1,000 mg by mouth every 6 (six) hours as needed for moderate pain or headache.   Yes [provider]  albuterol (VENTOLIN HFA) 108 (90 Base) MCG/ACT inhaler Inhale  2 puffs into the lungs every 6 (six) hours as needed for wheezing or shortness of breath. 10/11/19  Yes Sreenath, Sudheer B, MD  amLODipine (NORVASC) 5 MG tablet Take 5 mg by mouth daily.    Yes [provider]  apixaban (ELIQUIS) 2.5 MG TABS tablet Take 2.5 mg by mouth 2 (two) times daily.    Yes [provider]  atorvastatin (LIPITOR) 80 MG tablet Take 80 mg by mouth daily.   Yes [provider]  Calcium Carb-Cholecalciferol (CALCIUM 600 + D PO) Take 1 tablet by mouth daily.    Yes [provider]  cloNIDine (CATAPRES) 0.1 MG tablet Take 0.1 mg by mouth 2 (two) times daily.   Yes [provider]  esomeprazole (NEXIUM) 20 MG capsule Take 20 mg by mouth at bedtime.   Yes [provider]  ferrous sulfate 325 (65 FE) MG tablet Take 325 mg by mouth daily with breakfast.   Yes [provider]  furosemide (LASIX) 20 MG tablet Take 20 mg by mouth daily.    Yes [provider]  insulin NPH Human (NOVOLIN N) 100 UNIT/ML injection Inject 15-20 Units into the skin See admin instructions. Inject 20u under the skin every morning and inject 15u under the skin ever evening   Yes [provider]  ipratropium-albuterol (DUONEB) 0.5-2.5 (3) MG/3ML SOLN Take 3 mLs by nebulization 3 (three) times daily. 10/11/19  Yes Sreenath, Sudheer B, MD  lisinopril (PRINIVIL,ZESTRIL) 10 MG tablet Take 10 mg by mouth daily.   Yes [provider]  metFORMIN (GLUCOPHAGE) 1000 MG tablet Take 1,000 mg by mouth 2 (two) times daily. 06/02/19  Yes [provider]  vitamin B-12 (CYANOCOBALAMIN) 100 MCG tablet Take 100 mcg by mouth daily.   Yes [provider]  alendronate (FOSAMAX) 70 MG tablet Take 70 mg by mouth once a week.     [provider]  cyanocobalamin (,VITAMIN B-12,) 1000 MCG/ML injection Inject 1,000 mcg into the muscle every 30 (thirty) days.     [provider]  methylPREDNISolone (MEDROL) 4 MG tablet  Methylprednisolone taper: Day 1: take 20mg  (5 tabs) Day 2: Take 16mg  (4 tabs) Day 3: Take 12mg  (3 tabs) Day 4: Take 8mg  (2 tabs) Day 5: Take 4mg  (1 tab) Patient not taking: Reported on 01/15/2020 10/11/19   Ralene Muskrat B, MD  potassium chloride (K-DUR) 10 MEQ tablet Take 10 mEq by mouth daily.     [provider]    Inpatient Medications:  . amLODipine  5 mg Oral Daily  . apixaban  2.5 mg Oral BID  . atorvastatin  80 mg Oral Daily  . calcium-vitamin D  1 tablet Oral Daily  . [START ON 01/19/2020] cyanocobalamin  1,000 mcg Intramuscular Q30 days  . ferrous sulfate  325 mg Oral Q breakfast  . guaiFENesin  600 mg Oral BID  . insulin aspart  0-9 Units Subcutaneous TID PC & HS  . pantoprazole  40 mg Oral Daily   . cefTRIAXone (ROCEPHIN)  IV    . doxycycline (VIBRAMYCIN) IV 100 mg (01/16/20 4098)  . sodium bicarbonate 150 mEq in dextrose 5% 1000 mL 50 mL/hr at 01/16/20 1037    Allergies:  Allergies  Allergen Reactions  . Tramadol Anaphylaxis  .  Aspirin     "Stomach problems"    Social History   Socioeconomic History  . Marital status: Widowed    Spouse name: Not on file  . Number of children: Not on file  . Years of education: Not on file  . Highest education level: Not on file  Occupational History  . Not on file  Tobacco Use  . Smoking status: Never Smoker  . Smokeless tobacco: Never Used  Substance and Sexual Activity  . Alcohol use: No  . Drug use: Not on file  . Sexual activity: Not on file  Other Topics Concern  . Not on file  Social History Narrative  . Not on file   Social Determinants of Health   Financial Resource Strain:   . Difficulty of Paying Living Expenses:   Food Insecurity:   . Worried About Charity fundraiser in the Last Year:   . Arboriculturist in the Last Year:   Transportation Needs:   . Film/video editor (Medical):   Marland Kitchen Lack of Transportation (Non-Medical):   Physical Activity:   . Days of Exercise per Week:   . Minutes  of Exercise per Session:   Stress:   . Feeling of Stress :   Social Connections:   . Frequency of Communication with Friends and Family:   . Frequency of Social Gatherings with Friends and Family:   . Attends Religious Services:   . Active Member of Clubs or Organizations:   . Attends Archivist Meetings:   Marland Kitchen Marital Status:   Intimate Partner Violence:   . Fear of Current or Ex-Partner:   . Emotionally Abused:   Marland Kitchen Physically Abused:   . Sexually Abused:      No family history on file.   Review of Systems Positive for weakness fatigue shortness of breath Negative for: General:  chills, fever, night sweats or weight changes.  Cardiovascular: PND orthopnea syncope dizziness  Dermatological skin lesions rashes Respiratory: Cough congestion Urologic: Frequent urination urination at night and hematuria Abdominal: negative for nausea, vomiting, diarrhea, bright red blood per rectum, melena, or hematemesis Neurologic: negative for visual changes, and/or hearing changes  All other systems reviewed and are otherwise negative except as noted above.  Labs: No results for input(s): CKTOTAL, CKMB, TROPONINI in the last 72 hours. Lab Results  Component Value Date   WBC 7.8 01/16/2020   HGB 8.3 (L) 01/16/2020   HCT 27.7 (L) 01/16/2020   MCV 86.6 01/16/2020   PLT 203 01/16/2020    Recent Labs  Lab 01/15/20 1847 01/15/20 1847 01/16/20 0513  NA 137   < > 138  K 4.6   < > 4.7  CL 109   < > 111  CO2 15*   < > 16*  BUN 67*   < > 66*  CREATININE 5.16*   < > 5.54*  CALCIUM 8.3*   < > 7.7*  PROT 6.4*  --   --   BILITOT 0.9  --   --   ALKPHOS 79  --   --   ALT 15  --   --   AST 15  --   --   GLUCOSE 105*   < > 90   < > = values in this interval not displayed.   Lab Results  Component Value Date   CHOL 147 01/11/2012   HDL 34 (L) 01/11/2012   LDLCALC 49 01/11/2012   TRIG 319 (H) 01/11/2012   No results found  for: DDIMER  Radiology/Studies:  US RENAL  Result  Date: 01/16/2020 CLINICAL DATA:  85 year old female with a history of acute kidney injury EXAM: RENAL / URINARY TRACT ULTRASOUND COMPLETE COMPARISON:  04/03/2008 FINDINGS: Right Kidney: Length: 11.3 cm x 5.6 cm x 4.8 cm, 157 cc. No hydronephrosis. Flow confirmed in the hilum. Cystic structure with through transmission of the right upper pole cortex measures 6.1 cm x 3.0 cm x 3.4 cm. Left Kidney: Length: 12.6 cm x 5.0 cm x 5.9 cm, 193 cc. No hydronephrosis. Flow confirmed in the hilum. Cystic structure in the superior left kidney with through transmission and no internal complexity or flow measures 5.2 cm x 5.0 cm by 4.8 cm. Bladder: Appears normal for degree of bladder distention. IMPRESSION: Negative for hydronephrosis of the bilateral kidneys. Right-sided Bosniak 2 cyst and a left-sided Bosniak 1 cyst. Electronically Signed   By: Corrie Mckusick D.O.   On: 01/16/2020 09:21   DG Chest Portable 1 View  Result Date: 01/15/2020 CLINICAL DATA:  Shortness of breath and weakness. EXAM: PORTABLE CHEST 1 VIEW COMPARISON:  10/06/2019 FINDINGS: The cardiac silhouette remains borderline enlarged. Linear opacities are again demonstrated in the mid and lower lung zones on the left, resolved in the upper lung zones. Interval mild patchy density at the right lung base. Mildly increased patchy density at the left lung base with possible small bilateral pleural effusions. Diffuse osteopenia. IMPRESSION: 1. Interval mild patchy atelectasis or pneumonia at the right lung base. 2. Mildly increased patchy atelectasis or pneumonia at the left lung base. 3. Possible small bilateral pleural effusions. 4. Stable borderline cardiomegaly. Electronically Signed   By: Claudie Revering M.D.   On: 01/15/2020 19:12    EKG: Atrial fibrillation with slow ventricular rate  Weights: Filed Weights   01/15/20 1841 01/16/20 1130  Weight: 87.7 kg 87.1 kg     Physical Exam: Blood pressure (!) 112/25, pulse (!) 52, temperature (!) 97.4 F (36.3  C), temperature source Oral, resp. rate 16, height 5\' 6"  (1.676 m), weight 87.1 kg, SpO2 93 %. Body mass index is 31.01 kg/m. General: Well developed, well nourished, in no acute distress. Head eyes ears nose throat: Normocephalic, atraumatic, sclera non-icteric, no xanthomas, nares are without discharge. No apparent thyromegaly and/or mass  Lungs: Normal respiratory effort.  Few wheezes, some rales, no rhonchi.  Heart: Irregular with normal S1 S2. no murmur gallop, no rub, PMI is normal size and placement, carotid upstroke normal without bruit, jugular venous pressure is normal Abdomen: Soft, non-tender, non-distended with normoactive bowel sounds. No hepatomegaly. No rebound/guarding. No obvious abdominal masses. Abdominal aorta is normal size without bruit Extremities: Trace to 1+ edema. no cyanosis, no clubbing, no ulcers  Peripheral : 2+ bilateral upper extremity pulses, 2+ bilateral femoral pulses, 2+ bilateral dorsal pedal pulse Neuro: Alert and oriented. No facial asymmetry. No focal deficit. Moves all extremities spontaneously. Musculoskeletal: Normal muscle tone without kyphosis Psych:  Responds to questions appropriately with a normal affect.    Assessment: 85 year old female with hypertension hyperlipidemia diabetes chronic kidney disease significant iron deficiency anemia with with chronic atrial fibrillation having failure to thrive multifactorial in nature including possible pneumonia and variable heart rate  Plan: 1.  Discontinuation of clonidine due to concerns that the patient may have beta-blocking effects and slowing the heart rate down 2.  Continue observing heart rate control and consider the possibility of pacemaker placement if necessary 3.  Continue iron infusion and treatment of iron deficiency anemia contributing to above 4.  Hypertension control with amlodipine as necessary 5.  No further cardiac diagnostics necessary at this time 6.  Further consideration of  continuing treatment for possible pneumonia and following for improvements of symptoms  Signed, Corey Skains M.D. [facility] 01/16/2020, 3:31 PM

## 2020-01-16 NOTE — Progress Notes (Signed)
Pt has been deteriorating since transferred to floor around 0130 . Hypotension and Bradycardia have been reported to provider. Pt consistently was presenting with MAPS in the 50's and HR in the 30's. Pt reporting "not feeling too well". Pt was beginning to become more lethargic and was receiving medications ordered by provider.

## 2020-01-16 NOTE — Progress Notes (Signed)
*  PRELIMINARY RESULTS* Echocardiogram 2D Echocardiogram has been performed.  [place] 01/16/2020, 11:19 AM

## 2020-01-17 LAB — CBC WITH DIFFERENTIAL/PLATELET
Abs Immature Granulocytes: 0.02 10*3/uL (ref 0.00–0.07)
Basophils Absolute: 0 10*3/uL (ref 0.0–0.1)
Basophils Relative: 0 %
Eosinophils Absolute: 0 10*3/uL (ref 0.0–0.5)
Eosinophils Relative: 1 %
HCT: 28.2 % — ABNORMAL LOW (ref 36.0–46.0)
Hemoglobin: 8.4 g/dL — ABNORMAL LOW (ref 12.0–15.0)
Immature Granulocytes: 0 %
Lymphocytes Relative: 4 %
Lymphs Abs: 0.3 10*3/uL — ABNORMAL LOW (ref 0.7–4.0)
MCH: 26 pg (ref 26.0–34.0)
MCHC: 29.8 g/dL — ABNORMAL LOW (ref 30.0–36.0)
MCV: 87.3 fL (ref 80.0–100.0)
Monocytes Absolute: 0.6 10*3/uL (ref 0.1–1.0)
Monocytes Relative: 7 %
Neutro Abs: 6.7 10*3/uL (ref 1.7–7.7)
Neutrophils Relative %: 88 %
Platelets: 185 10*3/uL (ref 150–400)
RBC: 3.23 MIL/uL — ABNORMAL LOW (ref 3.87–5.11)
RDW: 15.4 % (ref 11.5–15.5)
WBC: 7.7 10*3/uL (ref 4.0–10.5)
nRBC: 0 % (ref 0.0–0.2)

## 2020-01-17 LAB — GLUCOSE, CAPILLARY
Glucose-Capillary: 123 mg/dL — ABNORMAL HIGH (ref 70–99)
Glucose-Capillary: 99 mg/dL (ref 70–99)
Glucose-Capillary: 139 mg/dL — ABNORMAL HIGH (ref 70–99)
Glucose-Capillary: 119 mg/dL — ABNORMAL HIGH (ref 70–99)
Glucose-Capillary: 140 mg/dL — ABNORMAL HIGH (ref 70–99)

## 2020-01-17 LAB — BASIC METABOLIC PANEL
Anion gap: 11 (ref 5–15)
BUN: 65 mg/dL — ABNORMAL HIGH (ref 8–23)
CO2: 20 mmol/L — ABNORMAL LOW (ref 22–32)
Calcium: 7.2 mg/dL — ABNORMAL LOW (ref 8.9–10.3)
Chloride: 107 mmol/L (ref 98–111)
Creatinine, Ser: 5.71 mg/dL — ABNORMAL HIGH (ref 0.44–1.00)
GFR calc Af Amer: 7 mL/min — ABNORMAL LOW (ref >60)
GFR calc non Af Amer: 6 mL/min — ABNORMAL LOW (ref >60)
Glucose, Bld: 127 mg/dL — ABNORMAL HIGH (ref 70–99)
Potassium: 3.8 mmol/L (ref 3.5–5.1)
Sodium: 138 mmol/L (ref 135–145)

## 2020-01-17 LAB — BRAIN NATRIURETIC PEPTIDE: B Natriuretic Peptide: 308 pg/mL — ABNORMAL HIGH (ref 0.0–100.0)

## 2020-01-17 LAB — CORTISOL: Cortisol, Plasma: 28.1 ug/dL

## 2020-01-17 LAB — MRSA PCR SCREENING: MRSA by PCR: NEGATIVE

## 2020-01-17 LAB — PROCALCITONIN: Procalcitonin: 0.18 ng/mL

## 2020-01-17 LAB — LACTIC ACID, PLASMA
Lactic Acid, Venous: 0.8 mmol/L (ref 0.5–1.9)
Lactic Acid, Venous: 0.8 mmol/L (ref 0.5–1.9)

## 2020-01-17 LAB — T4, FREE: Free T4: 1.15 ng/dL — ABNORMAL HIGH (ref 0.61–1.12)

## 2020-01-17 MED ORDER — DOXYCYCLINE HYCLATE 100 MG PO TABS
100.0000 mg | ORAL_TABLET | Freq: Two times a day (BID) | ORAL | Status: AC
  Administered 2020-01-17 – 2020-01-21 (×8): 100 mg via ORAL
  Filled 2020-01-17 (×8): qty 1

## 2020-01-17 MED ORDER — FERROUS SULFATE 325 (65 FE) MG PO TABS
325.0000 mg | ORAL_TABLET | Freq: Every day | ORAL | Status: DC
  Administered 2020-01-18 – 2020-01-23 (×6): 325 mg via ORAL
  Filled 2020-01-17 (×6): qty 1

## 2020-01-17 NOTE — Progress Notes (Signed)
[facility]  ROUNDING NOTE   Subjective:  Patient seen and evaluated bedside. It appears that she is not having any urine output. Creatinine currently 5.7. We had a long discussion about the potential for renal replacement therapy. It appears that the patient is open to this if deemed life preserving.   Objective:  Vital signs in last 24 hours:  Temp:  [97.4 F (36.3 C)-98.3 F (36.8 C)] 98.3 F (36.8 C) (05/08 1649) Pulse Rate:  [44-65] 56 (05/08 1649) Resp:  [12-26] 16 (05/08 1649) BP: (96-130)/(20-53) 129/47 (05/08 1649) SpO2:  [84 %-96 %] 96 % (05/08 1649) Weight:  [86.6 kg] 86.6 kg (05/07 2345)  Weight change: -0.564 kg Filed Weights   01/15/20 1841 01/16/20 1130 01/16/20 2345  Weight: 87.7 kg 87.1 kg 86.6 kg    Intake/Output: I/O last 3 completed shifts: In: 3680.6 [I.V.:1623.3; IV Piggyback:2057.3] Out: 0    Intake/Output this shift:  Total I/O In: 459.1 [I.V.:349.8; IV Piggyback:109.3] Out: -   Physical Exam: General: No acute distress  Head: Normocephalic, atraumatic. Moist oral mucosal membranes  Eyes: Anicteric  Neck: Supple, trachea midline  Lungs:  Clear to auscultation, normal effort  Heart: S1S2 irregular  Abdomen:  Soft, nontender, bowel sounds present  Extremities: Trace peripheral edema.  Neurologic: Awake, alert, following commands  Skin: No lesions  Access: None    Basic Metabolic Panel: Recent Labs  Lab 01/15/20 1847 01/15/20 1847 01/16/20 0513 01/16/20 2112 01/17/20 0723  NA 137  --  138 138 138  K 4.6  --  4.7 4.0 3.8  CL 109  --  111 109 107  CO2 15*  --  16* 17* 20*  GLUCOSE 105*  --  90 125* 127*  BUN 67*  --  66* 65* 65*  CREATININE 5.16*  --  5.54* 5.50* 5.71*  CALCIUM 8.3*   < > 7.7* 7.3* 7.2*   < > = values in this interval not displayed.    Liver Function Tests: Recent Labs  Lab 01/15/20 1847  AST 15  ALT 15  ALKPHOS 79  BILITOT 0.9  PROT 6.4*  ALBUMIN 3.3*   No results for input(s):  LIPASE, AMYLASE in the last 168 hours. No results for input(s): AMMONIA in the last 168 hours.  CBC: Recent Labs  Lab 01/15/20 1847 01/16/20 0513 01/17/20 0723  WBC 7.7 7.8 7.7  NEUTROABS 7.0  --  6.7  HGB 9.3* 8.3* 8.4*  HCT 31.5* 27.7* 28.2*  MCV 87.3 86.6 87.3  PLT 244 203 185    Cardiac Enzymes: No results for input(s): CKTOTAL, CKMB, CKMBINDEX, TROPONINI in the last 168 hours.  BNP: Invalid input(s): POCBNP  CBG: Recent Labs  Lab 01/16/20 2129 01/16/20 2348 01/17/20 0739 01/17/20 1146 01/17/20 1558  GLUCAP 110* 123* 99 139* 119*    Microbiology: Results for orders placed or performed during the hospital encounter of 01/15/20  Respiratory Panel by RT PCR (Flu A&B, Covid) - Nasopharyngeal Swab     Status: None   Collection Time: 01/15/20  7:38 PM   Specimen: Nasopharyngeal Swab  Result Value Ref Range Status   SARS Coronavirus 2 by RT PCR NEGATIVE NEGATIVE Final    Comment: (NOTE) SARS-CoV-2 target nucleic acids are NOT DETECTED. The SARS-CoV-2 RNA is generally detectable in upper respiratoy specimens during the acute phase of infection. The lowest concentration of SARS-CoV-2 viral copies this assay can detect is 131 copies/mL. A negative result does not preclude SARS-Cov-2 infection and should not be used as the  sole basis for treatment or other patient management decisions. A negative result may occur with  improper specimen collection/handling, submission of specimen other than nasopharyngeal swab, presence of viral mutation(s) within the areas targeted by this assay, and inadequate number of viral copies (<131 copies/mL). A negative result must be combined with clinical observations, patient history, and epidemiological information. The expected result is Negative. Fact Sheet for Patients:  PinkCheek.be Fact Sheet for Healthcare Providers:  GravelBags.it This test is not yet ap proved or cleared  by the [place] FDA and  has been authorized for detection and/or diagnosis of SARS-CoV-2 by FDA under an Emergency Use Authorization (EUA). This EUA will remain  in effect (meaning this test can be used) for the duration of the COVID-19 declaration under Section 564(b)(1) of the Act, 21 U.S.C. section 360bbb-3(b)(1), unless the authorization is terminated or revoked sooner.    Influenza A by PCR NEGATIVE NEGATIVE Final   Influenza B by PCR NEGATIVE NEGATIVE Final    Comment: (NOTE) The Xpert Xpress SARS-CoV-2/FLU/RSV assay is intended as an aid in  the diagnosis of influenza from Nasopharyngeal swab specimens and  should not be used as a sole basis for treatment. Nasal washings and  aspirates are unacceptable for Xpert Xpress SARS-CoV-2/FLU/RSV  testing. Fact Sheet for Patients: PinkCheek.be Fact Sheet for Healthcare Providers: GravelBags.it This test is not yet approved or cleared by the [place] FDA and  has been authorized for detection and/or diagnosis of SARS-CoV-2 by  FDA under an Emergency Use Authorization (EUA). This EUA will remain  in effect (meaning this test can be used) for the duration of the  Covid-19 declaration under Section 564(b)(1) of the Act, 21  U.S.C. section 360bbb-3(b)(1), unless the authorization is  terminated or revoked. Performed at [facility], [address] [place], [place] [address]   Blood Culture (routine x 2)     Status: None (Preliminary result)   Collection Time: 01/15/20  7:46 PM   Specimen: BLOOD RIGHT WRIST  Result Value Ref Range Status   Specimen Description BLOOD RIGHT WRIST  Final   Special Requests BOTTLES DRAWN AEROBIC AND ANAEROBIC  Final   Culture   Final    NO GROWTH 2 DAYS Performed at [facility], [address], [place], [place] [address]    Report Status PENDING  Incomplete  Blood Culture (routine x 2)     Status: None (Preliminary  result)   Collection Time: 01/16/20  5:26 AM   Specimen: Left Antecubital; Blood  Result Value Ref Range Status   Specimen Description LEFT ANTECUBITAL  Final   Special Requests   Final    BOTTLES DRAWN AEROBIC AND ANAEROBIC Blood Culture adequate volume   Culture   Final    NO GROWTH 1 DAY Performed at [facility], [address] [place], [place] [address]    Report Status PENDING  Incomplete  MRSA PCR Screening     Status: None   Collection Time: 01/16/20 11:51 PM   Specimen: Nasopharyngeal  Result Value Ref Range Status   MRSA by PCR NEGATIVE NEGATIVE Final    Comment:        The GeneXpert MRSA Assay (FDA approved for NASAL specimens only), is one component of a comprehensive MRSA colonization surveillance program. It is not intended to diagnose MRSA infection nor to guide or monitor treatment for MRSA infections. Performed at [facility], [address], [place], [place] [address]     Coagulation Studies: No results  for input(s): LABPROT, INR in the last 72 hours.  Urinalysis: No results for input(s): COLORURINE, LABSPEC, PHURINE, GLUCOSEU, HGBUR, BILIRUBINUR, KETONESUR, PROTEINUR, UROBILINOGEN, NITRITE, LEUKOCYTESUR in the last 72 hours.  Invalid input(s): APPERANCEUR    Imaging: US RENAL  Result Date: 01/16/2020 CLINICAL DATA:  85 year old female with a history of acute kidney injury EXAM: RENAL / URINARY TRACT ULTRASOUND COMPLETE COMPARISON:  04/03/2008 FINDINGS: Right Kidney: Length: 11.3 cm x 5.6 cm x 4.8 cm, 157 cc. No hydronephrosis. Flow confirmed in the hilum. Cystic structure with through transmission of the right upper pole cortex measures 6.1 cm x 3.0 cm x 3.4 cm. Left Kidney: Length: 12.6 cm x 5.0 cm x 5.9 cm, 193 cc. No hydronephrosis. Flow confirmed in the hilum. Cystic structure in the superior left kidney with through transmission and no internal complexity or flow measures 5.2 cm x 5.0 cm by 4.8 cm. Bladder: Appears normal for  degree of bladder distention. IMPRESSION: Negative for hydronephrosis of the bilateral kidneys. Right-sided Bosniak 2 cyst and a left-sided Bosniak 1 cyst. Electronically Signed   By: Corrie Mckusick D.O.   On: 01/16/2020 09:21   DG Chest Portable 1 View  Result Date: 01/15/2020 CLINICAL DATA:  Shortness of breath and weakness. EXAM: PORTABLE CHEST 1 VIEW COMPARISON:  10/06/2019 FINDINGS: The cardiac silhouette remains borderline enlarged. Linear opacities are again demonstrated in the mid and lower lung zones on the left, resolved in the upper lung zones. Interval mild patchy density at the right lung base. Mildly increased patchy density at the left lung base with possible small bilateral pleural effusions. Diffuse osteopenia. IMPRESSION: 1. Interval mild patchy atelectasis or pneumonia at the right lung base. 2. Mildly increased patchy atelectasis or pneumonia at the left lung base. 3. Possible small bilateral pleural effusions. 4. Stable borderline cardiomegaly. Electronically Signed   By: Claudie Revering M.D.   On: 01/15/2020 19:12   ECHOCARDIOGRAM COMPLETE  Result Date: 01/16/2020    ECHOCARDIOGRAM REPORT   Patient Name:   AMEYA VOWELL Date of Exam: 01/16/2020 Medical Rec #:  [id number]       Height:       66.0 in Accession #:    [id number]      Weight:       193.3 lb Date of Birth:  June 02, 1935       BSA:          1.971 m Patient Age:    86 years        BP:           112/23 mmHg Patient Gender: F               HR:           54 bpm. Exam Location:  [facility] Procedure: 2D Echo, Color Doppler and Cardiac Doppler Indications:     I50.31 CHF-Acute Diastolic  History:         Patient has prior history of Echocardiogram examinations. COPD;                  Risk Factors:Sleep Apnea, Hypertension, Diabetes and                  Dyslipidemia.  Sonographer:     Charmayne Sheer RDCS (AE) Referring Phys:  [id number] Sidney Ace Diagnosing Phys: Serafina Royals MD  Sonographer Comments: No subcostal window and suboptimal apical  window. Image acquisition challenging due to patient body habitus and Image acquisition challenging due to COPD. IMPRESSIONS  1.  Left ventricular ejection fraction, by estimation, is 55 to 60%. The left ventricle has normal function. The left ventricle has no regional wall motion abnormalities. Left ventricular diastolic function could not be evaluated.  2. Right ventricular systolic function is normal. The right ventricular size is normal. There is mildly elevated pulmonary artery systolic pressure.  3. Left atrial size was mildly dilated.  4. Right atrial size was mildly dilated.  5. The mitral valve is normal in structure. Mild mitral valve regurgitation.  6. The aortic valve is normal in structure. Aortic valve regurgitation is trivial. FINDINGS  Left Ventricle: Left ventricular ejection fraction, by estimation, is 55 to 60%. The left ventricle has normal function. The left ventricle has no regional wall motion abnormalities. The left ventricular internal cavity size was normal in size. There is  no left ventricular hypertrophy. Left ventricular diastolic function could not be evaluated. Right Ventricle: The right ventricular size is normal. No increase in right ventricular wall thickness. Right ventricular systolic function is normal. There is mildly elevated pulmonary artery systolic pressure. The tricuspid regurgitant velocity is 2.81  m/s, and with an assumed right atrial pressure of 10 mmHg, the estimated right ventricular systolic pressure is 28.7 mmHg. Left Atrium: Left atrial size was mildly dilated. Right Atrium: Right atrial size was mildly dilated. Pericardium: There is no evidence of pericardial effusion. Mitral Valve: The mitral valve is normal in structure. Mild mitral valve regurgitation. MV peak gradient, 13.7 mmHg. The mean mitral valve gradient is 3.0 mmHg. Tricuspid Valve: The tricuspid valve is normal in structure. Tricuspid valve regurgitation is mild. Aortic Valve: The aortic valve is  normal in structure. Aortic valve regurgitation is trivial. Aortic valve mean gradient measures 7.0 mmHg. Aortic valve peak gradient measures 17.3 mmHg. Aortic valve area, by VTI measures 2.07 cm. Pulmonic Valve: The pulmonic valve was normal in structure. Pulmonic valve regurgitation is not visualized. Aorta: The aortic root and ascending aorta are structurally normal, with no evidence of dilitation. IAS/Shunts: No atrial level shunt detected by color flow Doppler.  LEFT VENTRICLE PLAX 2D LVIDd:         4.47 cm  Diastology LVIDs:         2.53 cm  LV e' lateral:   6.74 cm/s LV PW:         1.04 cm  LV E/e' lateral: 25.4 LV IVS:        0.90 cm  LV e' medial:    5.22 cm/s LVOT diam:     1.90 cm  LV E/e' medial:  32.9 LV SV:         92 LV SV Index:   47 LVOT Area:     2.84 cm  RIGHT VENTRICLE RV Basal diam:  2.85 cm LEFT ATRIUM             Index       RIGHT ATRIUM           Index LA diam:        5.30 cm 2.69 cm/m  RA Area:     15.30 cm LA Vol (A2C):   62.9 ml 31.91 ml/m RA Volume:   37.30 ml  18.92 ml/m LA Vol (A4C):   64.9 ml 32.92 ml/m LA Biplane Vol: 64.9 ml 32.92 ml/m  AORTIC VALVE                    PULMONIC VALVE AV Area (Vmax):    2.18 cm     PV Vmax:  1.42 m/s AV Area (Vmean):   2.08 cm     PV Vmean:      90.400 cm/s AV Area (VTI):     2.07 cm     PV VTI:        0.317 m AV Vmax:           208.00 cm/s  PV Peak grad:  8.1 mmHg AV Vmean:          125.000 cm/s PV Mean grad:  4.0 mmHg AV VTI:            0.445 m AV Peak Grad:      17.3 mmHg AV Mean Grad:      7.0 mmHg LVOT Vmax:         160.00 cm/s LVOT Vmean:        91.500 cm/s LVOT VTI:          0.325 m LVOT/AV VTI ratio: 0.73  AORTA Ao Root diam: 2.90 cm MITRAL VALVE                TRICUSPID VALVE MV Area (PHT): 2.31 cm     TR Peak grad:   31.6 mmHg MV Peak grad:  13.7 mmHg    TR Vmax:        281.00 cm/s MV Mean grad:  3.0 mmHg MV Vmax:       1.85 m/s     SHUNTS MV Vmean:      62.1 cm/s    Systemic VTI:  0.32 m MV Decel Time: 329 msec     Systemic  Diam: 1.90 cm MV E velocity: 171.50 cm/s Serafina Royals MD Electronically signed by Serafina Royals MD Signature Date/Time: 01/16/2020/3:52:32 PM    Final      Medications:   . cefTRIAXone (ROCEPHIN)  IV Stopped (01/16/20 2223)  . sodium bicarbonate 150 mEq in dextrose 5% 1000 mL 50 mL/hr at 01/17/20 1400   . atorvastatin  80 mg Oral Daily  . calcium-vitamin D  1 tablet Oral Daily  . Chlorhexidine Gluconate Cloth  6 each Topical Daily  . [START ON 01/19/2020] cyanocobalamin  1,000 mcg Intramuscular Q30 days  . doxycycline  100 mg Oral Q12H  . [START ON 01/18/2020] ferrous sulfate  325 mg Oral Q lunch  . guaiFENesin  600 mg Oral BID  . insulin aspart  0-9 Units Subcutaneous TID PC & HS  . pantoprazole  40 mg Oral Daily   acetaminophen **OR** acetaminophen, albuterol, ondansetron **OR** ondansetron (ZOFRAN) IV, traZODone  Assessment/ Plan:  85 y.o. female with atrial fibrillation, congestive heart failure, COPD, diabetes mellitus, hypertension, obstructive sleep apnea, chronic kidney disease stage IIIb baseline EGFR 35 who was admitted with acute kidney injury, multifocal pneumonia.  1.  Acute kidney injury/chronic kidney disease stage IIIb baseline creatinine 1.4/EGFR 35.  Renal ultrasound negative for hydronephrosis but bilateral cysts noted. -Patient does not appear to have any significant urine output.  Serum electrolytes acceptable at the moment.  No immediate need for dialysis however if she continues to have no urine output we will consider starting the patient on dialysis tomorrow.  This was discussed with the patient and she seemed agreeable to this plan if deemed necessary.  2.  Metabolic acidosis.  Maintain the patient on sodium bicarbonate at 50 cc/h.  3.  Anemia of chronic kidney disease.  Hemoglobin currently 8.4.  Hold off on adding Epogen at the moment.   LOS: 2 Zymir Napoli 5/8/20215:40 PM

## 2020-01-17 NOTE — Progress Notes (Signed)
[facility] Encounter Note  Patient: Dorothy [name] / Admit Date: 01/15/2020 / Date of Encounter: 01/17/2020, 7:03 AM   Subjective: Patient feels relatively well this morning conversing without any difficulty breathing.  Patient has had significant hypotension overnight and was transferred to the intensive care unit for potential concerns.  Blood pressure more stable today.  Patient has had a discontinuation of amlodipine and clonidine due to this issue.  Heart rate appears to be relatively stable at 50 to 60 bpm without evidence of significant long RR intervals.  Patient's breathing is better after treatment of pulmonary infection.  Review of Systems: Positive for: Shortness of breath Negative for: Vision change, hearing change, syncope, dizziness, nausea, vomiting,diarrhea, bloody stool, stomach pain, cough, congestion, diaphoresis, urinary frequency, urinary pain,skin lesions, skin rashes Others previously listed  Objective: Telemetry: Atrial fibrillation with slow ventricular rate Physical Exam: Blood pressure (!) 113/43, pulse (!) 53, temperature (!) 97.5 F (36.4 C), temperature source Oral, resp. rate 13, height 5\' 4"  (1.626 m), weight 86.6 kg, SpO2 92 %. Body mass index is 32.77 kg/m. General: Well developed, well nourished, in no acute distress. Head: Normocephalic, atraumatic, sclera non-icteric, no xanthomas, nares are without discharge. Neck: No apparent masses Lungs: Normal respirations with some wheezes, some rhonchi, no rales , few basilar crackles   Heart: Irregular rate and rhythm, normal S1 S2, no murmur, no rub, no gallop, PMI is normal size and placement, carotid upstroke normal without bruit, jugular venous pressure normal Abdomen: Soft, non-tender, non-distended with normoactive bowel sounds. No hepatosplenomegaly. Abdominal aorta is normal size without bruit Extremities: 1+ edema, no clubbing, no cyanosis, no ulcers,  Peripheral: 2+ radial, 2+  femoral, 2+ dorsal pedal pulses Neuro: Alert and oriented. Moves all extremities spontaneously. Psych:  Responds to questions appropriately with a normal affect.   Intake/Output Summary (Last 24 hours) at 01/17/2020 0703 Last data filed at 01/17/2020 0600 Gross per 24 hour  Intake 1818.46 ml  Output 0 ml  Net 1818.46 ml    Inpatient Medications:  . apixaban  2.5 mg Oral BID  . atorvastatin  80 mg Oral Daily  . calcium-vitamin D  1 tablet Oral Daily  . Chlorhexidine Gluconate Cloth  6 each Topical Daily  . [START ON 01/19/2020] cyanocobalamin  1,000 mcg Intramuscular Q30 days  . ferrous sulfate  325 mg Oral Q breakfast  . guaiFENesin  600 mg Oral BID  . insulin aspart  0-9 Units Subcutaneous TID PC & HS  . pantoprazole  40 mg Oral Daily   Infusions:  . cefTRIAXone (ROCEPHIN)  IV Stopped (01/16/20 2223)  . doxycycline (VIBRAMYCIN) IV 125 mL/hr at 01/17/20 0600  . sodium bicarbonate 150 mEq in dextrose 5% 1000 mL 50 mL/hr at 01/17/20 0600    Labs: Recent Labs    01/16/20 0513 01/16/20 2112  NA 138 138  K 4.7 4.0  CL 111 109  CO2 16* 17*  GLUCOSE 90 125*  BUN 66* 65*  CREATININE 5.54* 5.50*  CALCIUM 7.7* 7.3*   Recent Labs    01/15/20 1847  AST 15  ALT 15  ALKPHOS 79  BILITOT 0.9  PROT 6.4*  ALBUMIN 3.3*   Recent Labs    01/15/20 1847 01/16/20 0513  WBC 7.7 7.8  NEUTROABS 7.0  --   HGB 9.3* 8.3*  HCT 31.5* 27.7*  MCV 87.3 86.6  PLT 244 203   No results for input(s): CKTOTAL, CKMB, TROPONINI in the last 72 hours. Invalid input(s): [place]  February 10, 2020 0513  HGBA1C 6.4*     Weights: Filed Weights   01/15/20 1841 02-10-2020 1130 02-10-20 2345  Weight: 87.7 kg 87.1 kg 86.6 kg     Radiology/Studies:  US RENAL  Result Date: February 10, 2020 CLINICAL DATA:  85 year old female with a history of acute kidney injury EXAM: RENAL / URINARY TRACT ULTRASOUND COMPLETE COMPARISON:  04/03/2008 FINDINGS: Right Kidney: Length: 11.3 cm x 5.6 cm x 4.8 cm, 157 cc.  No hydronephrosis. Flow confirmed in the hilum. Cystic structure with through transmission of the right upper pole cortex measures 6.1 cm x 3.0 cm x 3.4 cm. Left Kidney: Length: 12.6 cm x 5.0 cm x 5.9 cm, 193 cc. No hydronephrosis. Flow confirmed in the hilum. Cystic structure in the superior left kidney with through transmission and no internal complexity or flow measures 5.2 cm x 5.0 cm by 4.8 cm. Bladder: Appears normal for degree of bladder distention. IMPRESSION: Negative for hydronephrosis of the bilateral kidneys. Right-sided Bosniak 2 cyst and a left-sided Bosniak 1 cyst. Electronically Signed   By: Corrie Mckusick D.O.   On: February 10, 2020 09:21   DG Chest Portable 1 View  Result Date: 01/15/2020 CLINICAL DATA:  Shortness of breath and weakness. EXAM: PORTABLE CHEST 1 VIEW COMPARISON:  10/06/2019 FINDINGS: The cardiac silhouette remains borderline enlarged. Linear opacities are again demonstrated in the mid and lower lung zones on the left, resolved in the upper lung zones. Interval mild patchy density at the right lung base. Mildly increased patchy density at the left lung base with possible small bilateral pleural effusions. Diffuse osteopenia. IMPRESSION: 1. Interval mild patchy atelectasis or pneumonia at the right lung base. 2. Mildly increased patchy atelectasis or pneumonia at the left lung base. 3. Possible small bilateral pleural effusions. 4. Stable borderline cardiomegaly. Electronically Signed   By: Claudie Revering M.D.   On: 01/15/2020 19:12   ECHOCARDIOGRAM COMPLETE  Result Date: Feb 10, 2020    ECHOCARDIOGRAM REPORT   Patient Name:   Dorothy [name] Date of Exam: 02/10/20 Medical Rec #:  [id number]       Height:       66.0 in Accession #:    [id number]      Weight:       193.3 lb Date of Birth:  [birth year]       BSA:          1.971 m Patient Age:    85 years        BP:           112/23 mmHg Patient Gender: F               HR:           54 bpm. Exam Location:  [facility] Procedure: 2D Echo, Color Doppler  and Cardiac Doppler Indications:     I50.31 CHF-Acute Diastolic  History:         Patient has prior history of Echocardiogram examinations. COPD;                  Risk Factors:Sleep Apnea, Hypertension, Diabetes and                  Dyslipidemia.  Sonographer:     Charmayne Sheer RDCS (AE) Referring Phys:  [id number] Sidney Ace Diagnosing Phys: Serafina Royals MD  Sonographer Comments: No subcostal window and suboptimal apical window. Image acquisition challenging due to patient body habitus and Image acquisition challenging due to COPD. IMPRESSIONS  1. Left ventricular ejection fraction, by  estimation, is 55 to 60%. The left ventricle has normal function. The left ventricle has no regional wall motion abnormalities. Left ventricular diastolic function could not be evaluated.  2. Right ventricular systolic function is normal. The right ventricular size is normal. There is mildly elevated pulmonary artery systolic pressure.  3. Left atrial size was mildly dilated.  4. Right atrial size was mildly dilated.  5. The mitral valve is normal in structure. Mild mitral valve regurgitation.  6. The aortic valve is normal in structure. Aortic valve regurgitation is trivial. FINDINGS  Left Ventricle: Left ventricular ejection fraction, by estimation, is 55 to 60%. The left ventricle has normal function. The left ventricle has no regional wall motion abnormalities. The left ventricular internal cavity size was normal in size. There is  no left ventricular hypertrophy. Left ventricular diastolic function could not be evaluated. Right Ventricle: The right ventricular size is normal. No increase in right ventricular wall thickness. Right ventricular systolic function is normal. There is mildly elevated pulmonary artery systolic pressure. The tricuspid regurgitant velocity is 2.81  m/s, and with an assumed right atrial pressure of 10 mmHg, the estimated right ventricular systolic pressure is 38.7 mmHg. Left Atrium: Left atrial size  was mildly dilated. Right Atrium: Right atrial size was mildly dilated. Pericardium: There is no evidence of pericardial effusion. Mitral Valve: The mitral valve is normal in structure. Mild mitral valve regurgitation. MV peak gradient, 13.7 mmHg. The mean mitral valve gradient is 3.0 mmHg. Tricuspid Valve: The tricuspid valve is normal in structure. Tricuspid valve regurgitation is mild. Aortic Valve: The aortic valve is normal in structure. Aortic valve regurgitation is trivial. Aortic valve mean gradient measures 7.0 mmHg. Aortic valve peak gradient measures 17.3 mmHg. Aortic valve area, by VTI measures 2.07 cm. Pulmonic Valve: The pulmonic valve was normal in structure. Pulmonic valve regurgitation is not visualized. Aorta: The aortic root and ascending aorta are structurally normal, with no evidence of dilitation. IAS/Shunts: No atrial level shunt detected by color flow Doppler.  LEFT VENTRICLE PLAX 2D LVIDd:         4.47 cm  Diastology LVIDs:         2.53 cm  LV e' lateral:   6.74 cm/s LV PW:         1.04 cm  LV E/e' lateral: 25.4 LV IVS:        0.90 cm  LV e' medial:    5.22 cm/s LVOT diam:     1.90 cm  LV E/e' medial:  32.9 LV SV:         92 LV SV Index:   47 LVOT Area:     2.84 cm  RIGHT VENTRICLE RV Basal diam:  2.85 cm LEFT ATRIUM             Index       RIGHT ATRIUM           Index LA diam:        5.30 cm 2.69 cm/m  RA Area:     15.30 cm LA Vol (A2C):   62.9 ml 31.91 ml/m RA Volume:   37.30 ml  18.92 ml/m LA Vol (A4C):   64.9 ml 32.92 ml/m LA Biplane Vol: 64.9 ml 32.92 ml/m  AORTIC VALVE                    PULMONIC VALVE AV Area (Vmax):    2.18 cm     PV Vmax:  1.42 m/s AV Area (Vmean):   2.08 cm     PV Vmean:      90.400 cm/s AV Area (VTI):     2.07 cm     PV VTI:        0.317 m AV Vmax:           208.00 cm/s  PV Peak grad:  8.1 mmHg AV Vmean:          125.000 cm/s PV Mean grad:  4.0 mmHg AV VTI:            0.445 m AV Peak Grad:      17.3 mmHg AV Mean Grad:      7.0 mmHg LVOT Vmax:          160.00 cm/s LVOT Vmean:        91.500 cm/s LVOT VTI:          0.325 m LVOT/AV VTI ratio: 0.73  AORTA Ao Root diam: 2.90 cm MITRAL VALVE                TRICUSPID VALVE MV Area (PHT): 2.31 cm     TR Peak grad:   31.6 mmHg MV Peak grad:  13.7 mmHg    TR Vmax:        281.00 cm/s MV Mean grad:  3.0 mmHg MV Vmax:       1.85 m/s     SHUNTS MV Vmean:      62.1 cm/s    Systemic VTI:  0.32 m MV Decel Time: 329 msec     Systemic Diam: 1.90 cm MV E velocity: 171.50 cm/s Serafina Royals MD Electronically signed by Serafina Royals MD Signature Date/Time: 01/16/2020/3:52:32 PM    Final      Assessment and Recommendation  85 y.o. female with chronic nonvalvular atrial fibrillation with slow ventricular rate chronic kidney disease stage V anemia with acute respiratory failure likely due to infection pneumonia slightly improved with oxygenation with hypotension due to above but slowly improving 1.  No medication management for blood pressure due to hypotension and concerns 2.  Continue anticoagulation for further risk reduction in stroke with atrial fibrillation 3.  Watch telemetry for any significant long RR intervals or need for further intervention in the future with slow ventricular rate of atrial fibrillation 4.  No further cardiac diagnostics necessary at this time  Signed, Serafina Royals M.D. FACC

## 2020-01-17 NOTE — Progress Notes (Addendum)
PROGRESS NOTE    Dorothy [name]  PJA:[id number] DOB: 1935/01/02 DOA: 01/15/2020 PCP: Letta Median, MD   Brief Narrative:  85 y.o. Caucasian female with a known history of CHF, COPD, type 2 diabetes mellitus, GERD, dyslipidemia, hypertension and obstructive sleep apnea, who presented to the emergency room with acute onset of generalized weakness with associated dyspnea and diminished p.o. intake over the last week.  She admitted to chills without measured fever as well as mild dyspnea and dry cough and occasional wheezing.  No chest pain or palpitations.  No nausea or vomiting or abdominal pain.  No dysuria, oliguria or hematuria or flank pain..  She has chronic respiratory failure on home O2 at 2 L/min.  Upon presentation to the emergency room, heart rate was 53 pulse extremity 91% on 2 L of O2 by nasal cannula and later 94-95% on 3.5 L of O2, with blood pressure initially normal and later 99/46 with otherwise normal vital signs.  BUN and creatinine were 67 and 5.16 compared to 43/1.4 on 10/10/19.  BNP was 256 and CBC showed anemia close to baseline.  Albumin was 3.3 and total protein 6.4.  Portable chest x-ray showed interval mild patchy atelectasis or pneumonia at the right lung base with mild increased patchy atelectasis or pneumonia at the left lung base and possible bilateral small pleural effusions as well as stable borderline cardiomegaly.  5/7: Patient seen and examined.  Persistent hypotension overnight necessitating multiple fluid boluses.  Also baseline bradycardia and atrial fibrillation noted with occasional decreases in heart rate down to the 20s and 30s.  Patient relatively asymptomatic.  Mentating clearly.  Reports feeling "a little funny" but unable to specify further.  No fevers noted.  Lactic acid normal.  Patient has been anuric since admission.  Creatinine markedly elevated.  Nephrology consult called.  5/8: Patient seen and examined.  Patient transferred to ICU overnight  given concerns for hypotension.  On my evaluation this morning patient has a widened pulse pressure though systolics are approximately 120.  Patient is mentating clearly.  Serial lactic acid all within normal limits.  No indication for pressor support.  Case discussed with intensivist Dr Patsey Berthold.  Will transfer patient back to cardiac progressive unit. patient remains anuric.  Kidney function worsening.  Case discussed with nephrology.  No emergent need for dialysis however kidney function continues to worsen can consider dialysis catheter placement for acute dialysis tomorrow.  Respiratory status actually improving.  Patient breathing a little easier.  Patient stable for progressive cardiac unit at this time.  Patient has a widened pulse pressure which drives down the MAP.  Patient systolic blood pressure remains above 90 so she should be stable for progressive floor.  If there are concerns for worsening hypotension and shock recommend stat lactic acid.  Thus far lactic acids have been within normal limits indicating adequate perfusion.   Assessment & Plan:   Active Problems:   Multifocal pneumonia  Multifocal pneumonia. Patient was started on IV Rocephin and Zithromax on admission No fevers noted no worsening signs of infection Oxygen requirement down to 2 L Normal work of breathing No coughing noted Plan: Continue IV Rocephin Continue IV azithromycin Sputum Gram stain culture Urinary pneumonia antigens, unable to collect stool anuria Follow blood cultures, no growth to date Mucolytic's As needed duo nebs Oxygen as needed, wean as tolerated  Acute on chronic hypoxic respiratory failure secondary to pneumonia See above for acute management Wean oxygen as tolerated Improving over interval  Sepsis secondary  to pneumonia without severe sepsis or septic shock. Manifested by hypothermia, hypotension, hypoxia Despite relatively low maps the patient appears to be perfusing with normal  lactic acid Management as above  Acute kidney injury superimposed on stage IIIb chronic kidney disease Metabolic acidosis Patient received several liters crystalloid and colloid boluses without significant change in kidney function Creatinine actually worsened over interval Renal ultrasound no hydrobilateral renal cyst noted Nephrology consult called, case discussed with Dr. Candiss Norse, recommendations appreciated Plan: Continue bicarb for now No further albumin Cautious fluid administration We will use pressor support if necessary Patient may require hemodialysis during this admission if kidney function continues to worsen Case discussed with nephrologist Dr. Holley Raring  Atrial fibrillation Symptomatic bradycardia Chronic anticoagulation Patient on low-dose Eliquis No rate control agents appreciated on home med rec Patient has been relatively bradycardic with baseline rate in the 50s with occasional dropped into the 30s Plan: Cardiology consultation.  Patient is established with Dr. Saralyn Pilar from [facility].  Dr. Nehemiah Massed on consult Avoid AV nodal blocking agents Clonidine discontinued Continue Eliquis Per cardiology no further diagnostics necessary at this time Continue telemetry  Dyslipidemia. Statin  Hypertension. All home antihypertensives on hold  Type 2 diabetes mellitus. Hold home Metformin Sliding scale insulin  GERD. PPI   DVT prophylaxis: Eliquis Code Status: Full Family Communication:  Disposition Plan: Status is: Inpatient  Remains inpatient appropriate because:Inpatient level of care appropriate due to severity of illness   Dispo: The patient is from: Home              Anticipated d/c is to: Home              Anticipated d/c date is: 3 days              Patient currently is not medically stable to d/c.   Clinical sepsis improving.  Blood pressure improving.  Transferring back to progressive cardiac unit today.  Continue the same IV antibiotic  therapy.  Possibly may need hemodialysis during this admission.  Case has been discussed with nephrology will reevaluate tomorrow.   Consultants:   Nephrology- [facility]  Cardiology-[facility]  Procedures:   none  Antimicrobials:   Rocephin (01/15/20-  )  Azithromycin (01/15/20-  )   Subjective: Seen and examined Mentating clearly No pain complaints  Objective: Vitals:   01/17/20 0805 01/17/20 0900 01/17/20 1005 01/17/20 1100  BP: (!) 113/46 (!) 96/53 (!) 129/47 (!) 130/44  Pulse: (!) 52 (!) 48 65 (!) 56  Resp: 16 13 15 14   Temp:      TempSrc:      SpO2: 92% 93% (!) 89% 91%  Weight:      Height:        Intake/Output Summary (Last 24 hours) at 01/17/2020 1341 Last data filed at 01/17/2020 1100 Gross per 24 hour  Intake 2302.61 ml  Output 0 ml  Net 2302.61 ml   Filed Weights   01/15/20 1841 01/16/20 1130 01/16/20 2345  Weight: 87.7 kg 87.1 kg 86.6 kg    Examination:  General exam: Appears calm and comfortable, appears frail Respiratory system: Mild bibasilar crackles.  Normal work of breathing Cardiovascular system: S1 & S2 heard, RRR. No JVD, murmurs, rubs, gallops or clicks. No pedal edema. Gastrointestinal system: Abdomen is nondistended, soft and nontender. No organomegaly or masses felt. Normal bowel sounds heard. Central nervous system: Alert and oriented. No focal neurological deficits. Extremities: Symmetric 5 x 5 power. Skin: No rashes, lesions or ulcers Psychiatry: Judgement and insight appear  normal. Mood & affect appropriate.     Data Reviewed: I have personally reviewed following labs and imaging studies  CBC: Recent Labs  Lab 01/15/20 1847 01/16/20 0513 01/17/20 0723  WBC 7.7 7.8 7.7  NEUTROABS 7.0  --  6.7  HGB 9.3* 8.3* 8.4*  HCT 31.5* 27.7* 28.2*  MCV 87.3 86.6 87.3  PLT 244 203 034   Basic Metabolic Panel: Recent Labs  Lab 01/15/20 1847 01/16/20 0513 01/16/20 2112 01/17/20 0723  NA 137 138 138 138  K 4.6 4.7 4.0 3.8  CL 109  111 109 107  CO2 15* 16* 17* 20*  GLUCOSE 105* 90 125* 127*  BUN 67* 66* 65* 65*  CREATININE 5.16* 5.54* 5.50* 5.71*  CALCIUM 8.3* 7.7* 7.3* 7.2*   GFR: Estimated Creatinine Clearance: 8.1 mL/min (A) (by C-G formula based on SCr of 5.71 mg/dL (H)). Liver Function Tests: Recent Labs  Lab 01/15/20 1847  AST 15  ALT 15  ALKPHOS 79  BILITOT 0.9  PROT 6.4*  ALBUMIN 3.3*   No results for input(s): LIPASE, AMYLASE in the last 168 hours. No results for input(s): AMMONIA in the last 168 hours. Coagulation Profile: No results for input(s): INR, PROTIME in the last 168 hours. Cardiac Enzymes: No results for input(s): CKTOTAL, CKMB, CKMBINDEX, TROPONINI in the last 168 hours. BNP (last 3 results) No results for input(s): PROBNP in the last 8760 hours. HbA1C: Recent Labs    01/16/20 0513  HGBA1C 6.4*   CBG: Recent Labs  Lab 01/16/20 1538 01/16/20 2129 01/16/20 2348 01/17/20 0739 01/17/20 1146  GLUCAP 107* 110* 123* 99 139*   Lipid Profile: No results for input(s): CHOL, HDL, LDLCALC, TRIG, CHOLHDL, LDLDIRECT in the last 72 hours. Thyroid Function Tests: Recent Labs    01/16/20 2130 01/17/20 0704  TSH 2.417  --   FREET4  --  1.15*   Anemia Panel: No results for input(s): VITAMINB12, FOLATE, FERRITIN, TIBC, IRON, RETICCTPCT in the last 72 hours. Sepsis Labs: Recent Labs  Lab 01/15/20 1946 01/15/20 2045 01/16/20 0805 01/16/20 1121 01/17/20 0703 01/17/20 0946  PROCALCITON 0.17  --   --   --  0.18  --   LATICACIDVEN  --    < > 0.7 0.7 0.8 0.8   < > = values in this interval not displayed.    Recent Results (from the past 240 hour(s))  Respiratory Panel by RT PCR (Flu A&B, Covid) - Nasopharyngeal Swab     Status: None   Collection Time: 01/15/20  7:38 PM   Specimen: Nasopharyngeal Swab  Result Value Ref Range Status   SARS Coronavirus 2 by RT PCR NEGATIVE NEGATIVE Final    Comment: (NOTE) SARS-CoV-2 target nucleic acids are NOT DETECTED. The SARS-CoV-2 RNA  is generally detectable in upper respiratoy specimens during the acute phase of infection. The lowest concentration of SARS-CoV-2 viral copies this assay can detect is 131 copies/mL. A negative result does not preclude SARS-Cov-2 infection and should not be used as the sole basis for treatment or other patient management decisions. A negative result may occur with  improper specimen collection/handling, submission of specimen other than nasopharyngeal swab, presence of viral mutation(s) within the areas targeted by this assay, and inadequate number of viral copies (<131 copies/mL). A negative result must be combined with clinical observations, patient history, and epidemiological information. The expected result is Negative. Fact Sheet for Patients:  PinkCheek.be Fact Sheet for Healthcare Providers:  GravelBags.it This test is not yet ap proved or cleared by the  [place] Architectural technologist and  has been authorized for detection and/or diagnosis of SARS-CoV-2 by FDA under an Print production planner (EUA). This EUA will remain  in effect (meaning this test can be used) for the duration of the COVID-19 declaration under Section 564(b)(1) of the Act, 21 U.S.C. section 360bbb-3(b)(1), unless the authorization is terminated or revoked sooner.    Influenza A by PCR NEGATIVE NEGATIVE Final   Influenza B by PCR NEGATIVE NEGATIVE Final    Comment: (NOTE) The Xpert Xpress SARS-CoV-2/FLU/RSV assay is intended as an aid in  the diagnosis of influenza from Nasopharyngeal swab specimens and  should not be used as a sole basis for treatment. Nasal washings and  aspirates are unacceptable for Xpert Xpress SARS-CoV-2/FLU/RSV  testing. Fact Sheet for Patients: PinkCheek.be Fact Sheet for Healthcare Providers: GravelBags.it This test is not yet approved or cleared by the [place] FDA and   has been authorized for detection and/or diagnosis of SARS-CoV-2 by  FDA under an Emergency Use Authorization (EUA). This EUA will remain  in effect (meaning this test can be used) for the duration of the  Covid-19 declaration under Section 564(b)(1) of the Act, 21  U.S.C. section 360bbb-3(b)(1), unless the authorization is  terminated or revoked. Performed at [facility], [address] [place], [place] [address]   Blood Culture (routine x 2)     Status: None (Preliminary result)   Collection Time: 01/15/20  7:46 PM   Specimen: BLOOD RIGHT WRIST  Result Value Ref Range Status   Specimen Description BLOOD RIGHT WRIST  Final   Special Requests BOTTLES DRAWN AEROBIC AND ANAEROBIC  Final   Culture   Final    NO GROWTH 2 DAYS Performed at [facility], [address], [place], [place] [address]    Report Status PENDING  Incomplete  Blood Culture (routine x 2)     Status: None (Preliminary result)   Collection Time: 02-10-20  5:26 AM   Specimen: Left Antecubital; Blood  Result Value Ref Range Status   Specimen Description LEFT ANTECUBITAL  Final   Special Requests   Final    BOTTLES DRAWN AEROBIC AND ANAEROBIC Blood Culture adequate volume   Culture   Final    NO GROWTH 1 DAY Performed at [facility], [address] [place], [place] [address]    Report Status PENDING  Incomplete  MRSA PCR Screening     Status: None   Collection Time: 10-Feb-2020 11:51 PM   Specimen: Nasopharyngeal  Result Value Ref Range Status   MRSA by PCR NEGATIVE NEGATIVE Final    Comment:        The GeneXpert MRSA Assay (FDA approved for NASAL specimens only), is one component of a comprehensive MRSA colonization surveillance program. It is not intended to diagnose MRSA infection nor to guide or monitor treatment for MRSA infections. Performed at [facility], [address] [place], [place] [address]          Radiology Studies: US RENAL  Result  Date: 2020-02-10 CLINICAL DATA:  85 year old female with a history of acute kidney injury EXAM: RENAL / URINARY TRACT ULTRASOUND COMPLETE COMPARISON:  04/03/2008 FINDINGS: Right Kidney: Length: 11.3 cm x 5.6 cm x 4.8 cm, 157 cc. No hydronephrosis. Flow confirmed in the hilum. Cystic structure with through transmission of the right upper pole cortex measures 6.1 cm x 3.0 cm x 3.4 cm. Left Kidney: Length: 12.6 cm x 5.0 cm x 5.9 cm, 193 cc. No hydronephrosis. Flow  confirmed in the hilum. Cystic structure in the superior left kidney with through transmission and no internal complexity or flow measures 5.2 cm x 5.0 cm by 4.8 cm. Bladder: Appears normal for degree of bladder distention. IMPRESSION: Negative for hydronephrosis of the bilateral kidneys. Right-sided Bosniak 2 cyst and a left-sided Bosniak 1 cyst. Electronically Signed   By: Corrie Mckusick D.O.   On: 01/16/2020 09:21   DG Chest Portable 1 View  Result Date: 01/15/2020 CLINICAL DATA:  Shortness of breath and weakness. EXAM: PORTABLE CHEST 1 VIEW COMPARISON:  10/06/2019 FINDINGS: The cardiac silhouette remains borderline enlarged. Linear opacities are again demonstrated in the mid and lower lung zones on the left, resolved in the upper lung zones. Interval mild patchy density at the right lung base. Mildly increased patchy density at the left lung base with possible small bilateral pleural effusions. Diffuse osteopenia. IMPRESSION: 1. Interval mild patchy atelectasis or pneumonia at the right lung base. 2. Mildly increased patchy atelectasis or pneumonia at the left lung base. 3. Possible small bilateral pleural effusions. 4. Stable borderline cardiomegaly. Electronically Signed   By: Claudie Revering M.D.   On: 01/15/2020 19:12   ECHOCARDIOGRAM COMPLETE  Result Date: 01/16/2020    ECHOCARDIOGRAM REPORT   Patient Name:   JENY NIELD Date of Exam: 01/16/2020 Medical Rec #:  [id number]       Height:       66.0 in Accession #:    [id number]      Weight:        193.3 lb Date of Birth:  07-Dec-1934       BSA:          1.971 m Patient Age:    14 years        BP:           112/23 mmHg Patient Gender: F               HR:           54 bpm. Exam Location:  [facility] Procedure: 2D Echo, Color Doppler and Cardiac Doppler Indications:     I50.31 CHF-Acute Diastolic  History:         Patient has prior history of Echocardiogram examinations. COPD;                  Risk Factors:Sleep Apnea, Hypertension, Diabetes and                  Dyslipidemia.  Sonographer:     Charmayne Sheer RDCS (AE) Referring Phys:  [id number] Sidney Ace Diagnosing Phys: Serafina Royals MD  Sonographer Comments: No subcostal window and suboptimal apical window. Image acquisition challenging due to patient body habitus and Image acquisition challenging due to COPD. IMPRESSIONS  1. Left ventricular ejection fraction, by estimation, is 55 to 60%. The left ventricle has normal function. The left ventricle has no regional wall motion abnormalities. Left ventricular diastolic function could not be evaluated.  2. Right ventricular systolic function is normal. The right ventricular size is normal. There is mildly elevated pulmonary artery systolic pressure.  3. Left atrial size was mildly dilated.  4. Right atrial size was mildly dilated.  5. The mitral valve is normal in structure. Mild mitral valve regurgitation.  6. The aortic valve is normal in structure. Aortic valve regurgitation is trivial. FINDINGS  Left Ventricle: Left ventricular ejection fraction, by estimation, is 55 to 60%. The left ventricle has normal function. The left ventricle has no regional wall  motion abnormalities. The left ventricular internal cavity size was normal in size. There is  no left ventricular hypertrophy. Left ventricular diastolic function could not be evaluated. Right Ventricle: The right ventricular size is normal. No increase in right ventricular wall thickness. Right ventricular systolic function is normal. There is mildly elevated  pulmonary artery systolic pressure. The tricuspid regurgitant velocity is 2.81  m/s, and with an assumed right atrial pressure of 10 mmHg, the estimated right ventricular systolic pressure is 81.8 mmHg. Left Atrium: Left atrial size was mildly dilated. Right Atrium: Right atrial size was mildly dilated. Pericardium: There is no evidence of pericardial effusion. Mitral Valve: The mitral valve is normal in structure. Mild mitral valve regurgitation. MV peak gradient, 13.7 mmHg. The mean mitral valve gradient is 3.0 mmHg. Tricuspid Valve: The tricuspid valve is normal in structure. Tricuspid valve regurgitation is mild. Aortic Valve: The aortic valve is normal in structure. Aortic valve regurgitation is trivial. Aortic valve mean gradient measures 7.0 mmHg. Aortic valve peak gradient measures 17.3 mmHg. Aortic valve area, by VTI measures 2.07 cm. Pulmonic Valve: The pulmonic valve was normal in structure. Pulmonic valve regurgitation is not visualized. Aorta: The aortic root and ascending aorta are structurally normal, with no evidence of dilitation. IAS/Shunts: No atrial level shunt detected by color flow Doppler.  LEFT VENTRICLE PLAX 2D LVIDd:         4.47 cm  Diastology LVIDs:         2.53 cm  LV e' lateral:   6.74 cm/s LV PW:         1.04 cm  LV E/e' lateral: 25.4 LV IVS:        0.90 cm  LV e' medial:    5.22 cm/s LVOT diam:     1.90 cm  LV E/e' medial:  32.9 LV SV:         92 LV SV Index:   47 LVOT Area:     2.84 cm  RIGHT VENTRICLE RV Basal diam:  2.85 cm LEFT ATRIUM             Index       RIGHT ATRIUM           Index LA diam:        5.30 cm 2.69 cm/m  RA Area:     15.30 cm LA Vol (A2C):   62.9 ml 31.91 ml/m RA Volume:   37.30 ml  18.92 ml/m LA Vol (A4C):   64.9 ml 32.92 ml/m LA Biplane Vol: 64.9 ml 32.92 ml/m  AORTIC VALVE                    PULMONIC VALVE AV Area (Vmax):    2.18 cm     PV Vmax:       1.42 m/s AV Area (Vmean):   2.08 cm     PV Vmean:      90.400 cm/s AV Area (VTI):     2.07 cm     PV  VTI:        0.317 m AV Vmax:           208.00 cm/s  PV Peak grad:  8.1 mmHg AV Vmean:          125.000 cm/s PV Mean grad:  4.0 mmHg AV VTI:            0.445 m AV Peak Grad:      17.3 mmHg AV Mean Grad:  7.0 mmHg LVOT Vmax:         160.00 cm/s LVOT Vmean:        91.500 cm/s LVOT VTI:          0.325 m LVOT/AV VTI ratio: 0.73  AORTA Ao Root diam: 2.90 cm MITRAL VALVE                TRICUSPID VALVE MV Area (PHT): 2.31 cm     TR Peak grad:   31.6 mmHg MV Peak grad:  13.7 mmHg    TR Vmax:        281.00 cm/s MV Mean grad:  3.0 mmHg MV Vmax:       1.85 m/s     SHUNTS MV Vmean:      62.1 cm/s    Systemic VTI:  0.32 m MV Decel Time: 329 msec     Systemic Diam: 1.90 cm MV E velocity: 171.50 cm/s Serafina Royals MD Electronically signed by Serafina Royals MD Signature Date/Time: 01/16/2020/3:52:32 PM    Final         Scheduled Meds: . atorvastatin  80 mg Oral Daily  . calcium-vitamin D  1 tablet Oral Daily  . Chlorhexidine Gluconate Cloth  6 each Topical Daily  . [START ON 01/19/2020] cyanocobalamin  1,000 mcg Intramuscular Q30 days  . doxycycline  100 mg Oral Q12H  . [START ON 01/18/2020] ferrous sulfate  325 mg Oral Q lunch  . guaiFENesin  600 mg Oral BID  . insulin aspart  0-9 Units Subcutaneous TID PC & HS  . pantoprazole  40 mg Oral Daily   Continuous Infusions: . cefTRIAXone (ROCEPHIN)  IV Stopped (01/16/20 2223)  . sodium bicarbonate 150 mEq in dextrose 5% 1000 mL 50 mL/hr at 01/17/20 1100     LOS: 2 days    Time spent: 35 minutes    Sidney Ace, MD [facility] Pager [phone]  If 7PM-7AM, please contact night-coverage 01/17/2020, 1:41 PM

## 2020-01-17 NOTE — Progress Notes (Signed)
Patient transferred to 2A 259 from ICU. Report called and given to this RN.  Daughter at bedside.  Patient without complaints at this time. Call bell in reach.

## 2020-01-17 NOTE — Progress Notes (Signed)
eLink Physician-Brief Progress Note Patient Name: OMOLOLA MITTMAN DOB: [birth year] MRN: [id number]   Date of Service  01/17/2020  HPI/Events of Note  85 year old woman admitted to ICU with septic shock, pneumonia, AKI with no obstruction, bradycardia and history of CHF, A hib and COPD. Brought to ICu for pressors.  eICU Interventions  DNR, DNI. Comfortable on camera On antibiotics, fluids per nephrology and starting pressors to keep map > 65 Seen by bedside PCCM Please call E link If needed     Intervention Category Major Interventions: Sepsis - evaluation and management;Shock - evaluation and management;Acid-Base disturbance - evaluation and management Evaluation Type: New Patient Evaluation  [name] 01/17/2020, 12:05 AM

## 2020-01-17 NOTE — Progress Notes (Addendum)
PHARMACIST - PHYSICIAN COMMUNICATION  CONCERNING: Antibiotic IV to Oral Route Change Policy  RECOMMENDATION: This patient is receiving doxycycline by the intravenous route.  Based on criteria approved by the Pharmacy and Therapeutics Committee, the antibiotic(s) is/are being converted to the equivalent oral dose form(s).   DESCRIPTION: These criteria include:  Patient being treated for a respiratory tract infection, urinary tract infection, cellulitis or clostridium difficile associated diarrhea if on metronidazole  The patient is not neutropenic and does not exhibit a GI malabsorption state  The patient is eating (either orally or via tube) and/or has been taking other orally administered medications for a least 24 hours  The patient is improving clinically and has a Tmax < 100.5  Orders placed for doxycycline 100mg  PO Q12H. Will schedule oral iron with lunchtime to avoid interaction.   If you have questions about this conversion, please contact the [place], PharmD, BCPS Clinical Pharmacist 01/17/2020 1:42 PM

## 2020-01-18 ENCOUNTER — Inpatient Hospital Stay: Payer: Medicare Other

## 2020-01-18 LAB — BASIC METABOLIC PANEL
Anion gap: 11 (ref 5–15)
BUN: 67 mg/dL — ABNORMAL HIGH (ref 8–23)
CO2: 24 mmol/L (ref 22–32)
Calcium: 7.3 mg/dL — ABNORMAL LOW (ref 8.9–10.3)
Chloride: 106 mmol/L (ref 98–111)
Creatinine, Ser: 5.75 mg/dL — ABNORMAL HIGH (ref 0.44–1.00)
GFR calc Af Amer: 7 mL/min — ABNORMAL LOW (ref >60)
GFR calc non Af Amer: 6 mL/min — ABNORMAL LOW (ref >60)
Glucose, Bld: 130 mg/dL — ABNORMAL HIGH (ref 70–99)
Potassium: 3.5 mmol/L (ref 3.5–5.1)
Sodium: 141 mmol/L (ref 135–145)

## 2020-01-18 LAB — CBC WITH DIFFERENTIAL/PLATELET
Abs Immature Granulocytes: 0.02 10*3/uL (ref 0.00–0.07)
Basophils Absolute: 0 10*3/uL (ref 0.0–0.1)
Basophils Relative: 0 %
Eosinophils Absolute: 0 10*3/uL (ref 0.0–0.5)
Eosinophils Relative: 1 %
HCT: 28 % — ABNORMAL LOW (ref 36.0–46.0)
Hemoglobin: 8.6 g/dL — ABNORMAL LOW (ref 12.0–15.0)
Immature Granulocytes: 0 %
Lymphocytes Relative: 4 %
Lymphs Abs: 0.3 10*3/uL — ABNORMAL LOW (ref 0.7–4.0)
MCH: 25.7 pg — ABNORMAL LOW (ref 26.0–34.0)
MCHC: 30.7 g/dL (ref 30.0–36.0)
MCV: 83.6 fL (ref 80.0–100.0)
Monocytes Absolute: 0.4 10*3/uL (ref 0.1–1.0)
Monocytes Relative: 7 %
Neutro Abs: 5.8 10*3/uL (ref 1.7–7.7)
Neutrophils Relative %: 88 %
Platelets: 189 10*3/uL (ref 150–400)
RBC: 3.35 MIL/uL — ABNORMAL LOW (ref 3.87–5.11)
RDW: 15.6 % — ABNORMAL HIGH (ref 11.5–15.5)
WBC: 6.5 10*3/uL (ref 4.0–10.5)
nRBC: 0 % (ref 0.0–0.2)

## 2020-01-18 LAB — URINALYSIS, COMPLETE (UACMP) WITH MICROSCOPIC
Bilirubin Urine: NEGATIVE
Glucose, UA: NEGATIVE mg/dL
Ketones, ur: NEGATIVE mg/dL
Nitrite: NEGATIVE
Protein, ur: 30 mg/dL — AB
Specific Gravity, Urine: 1.012 (ref 1.005–1.030)
pH: 5 (ref 5.0–8.0)

## 2020-01-18 LAB — GLUCOSE, CAPILLARY
Glucose-Capillary: 124 mg/dL — ABNORMAL HIGH (ref 70–99)
Glucose-Capillary: 102 mg/dL — ABNORMAL HIGH (ref 70–99)
Glucose-Capillary: 103 mg/dL — ABNORMAL HIGH (ref 70–99)
Glucose-Capillary: 96 mg/dL (ref 70–99)

## 2020-01-18 LAB — PROTEIN / CREATININE RATIO, URINE
Creatinine, Urine: 81 mg/dL
Protein Creatinine Ratio: 0.85 mg/mg{Cre} — ABNORMAL HIGH (ref 0.00–0.15)
Total Protein, Urine: 69 mg/dL

## 2020-01-18 LAB — NA AND K (SODIUM & POTASSIUM), RAND UR
Potassium Urine: 8 mmol/L
Sodium, Ur: 67 mmol/L

## 2020-01-18 LAB — PROCALCITONIN: Procalcitonin: 0.17 ng/mL

## 2020-01-18 LAB — CREATININE, URINE, RANDOM: Creatinine, Urine: 80 mg/dL

## 2020-01-18 MED ORDER — CIPROFLOXACIN-DEXAMETHASONE 0.3-0.1 % OT SUSP
4.0000 [drp] | Freq: Two times a day (BID) | OTIC | Status: DC
  Administered 2020-01-18 – 2020-01-22 (×9): 4 [drp] via OTIC
  Filled 2020-01-18: qty 7.5

## 2020-01-18 MED ORDER — INSULIN ASPART 100 UNIT/ML ~~LOC~~ SOLN
0.0000 [IU] | Freq: Three times a day (TID) | SUBCUTANEOUS | Status: DC
  Administered 2020-01-21: 17:00:00 2 [IU] via SUBCUTANEOUS
  Administered 2020-01-22 (×2): 1 [IU] via SUBCUTANEOUS
  Administered 2020-01-22 – 2020-01-23 (×2): 2 [IU] via SUBCUTANEOUS
  Administered 2020-01-23: 1 [IU] via SUBCUTANEOUS
  Filled 2020-01-18 (×6): qty 1

## 2020-01-18 MED ORDER — IPRATROPIUM-ALBUTEROL 0.5-2.5 (3) MG/3ML IN SOLN
3.0000 mL | Freq: Four times a day (QID) | RESPIRATORY_TRACT | Status: DC
  Administered 2020-01-18 – 2020-01-23 (×20): 3 mL via RESPIRATORY_TRACT
  Filled 2020-01-18 (×19): qty 3

## 2020-01-18 MED ORDER — SALINE SPRAY 0.65 % NA SOLN
1.0000 | NASAL | Status: DC | PRN
  Filled 2020-01-18 (×2): qty 44

## 2020-01-18 MED ORDER — HEPARIN SODIUM (PORCINE) 5000 UNIT/ML IJ SOLN
5000.0000 [IU] | Freq: Three times a day (TID) | INTRAMUSCULAR | Status: DC
  Administered 2020-01-18 – 2020-01-21 (×7): 5000 [IU] via SUBCUTANEOUS
  Filled 2020-01-18 (×7): qty 1

## 2020-01-18 MED ORDER — FUROSEMIDE 10 MG/ML IJ SOLN
120.0000 mg | Freq: Once | INTRAVENOUS | Status: AC
  Administered 2020-01-18: 14:00:00 120 mg via INTRAVENOUS
  Filled 2020-01-18: qty 12

## 2020-01-18 NOTE — Progress Notes (Signed)
PROGRESS NOTE    Dorothy [name]  JKK:[id number] DOB: 1935-04-12 DOA: 01/15/2020 PCP: Letta Median, MD   Brief Narrative:  85 y.o. Caucasian female with a known history of CHF, COPD, type 2 diabetes mellitus, GERD, dyslipidemia, hypertension and obstructive sleep apnea, who presented to the emergency room with acute onset of generalized weakness with associated dyspnea and diminished p.o. intake over the last week.  She admitted to chills without measured fever as well as mild dyspnea and dry cough and occasional wheezing.  No chest pain or palpitations.  No nausea or vomiting or abdominal pain.  No dysuria, oliguria or hematuria or flank pain..  She has chronic respiratory failure on home O2 at 2 L/min.  Upon presentation to the emergency room, heart rate was 53 pulse extremity 91% on 2 L of O2 by nasal cannula and later 94-95% on 3.5 L of O2, with blood pressure initially normal and later 99/46 with otherwise normal vital signs.  BUN and creatinine were 67 and 5.16 compared to 43/1.4 on 10/10/19.  BNP was 256 and CBC showed anemia close to baseline.  Albumin was 3.3 and total protein 6.4.  Portable chest x-ray showed interval mild patchy atelectasis or pneumonia at the right lung base with mild increased patchy atelectasis or pneumonia at the left lung base and possible bilateral small pleural effusions as well as stable borderline cardiomegaly.  5/7: Patient seen and examined.  Persistent hypotension overnight necessitating multiple fluid boluses.  Also baseline bradycardia and atrial fibrillation noted with occasional decreases in heart rate down to the 20s and 30s.  Patient relatively asymptomatic.  Mentating clearly.  Reports feeling "a little funny" but unable to specify further.  No fevers noted.  Lactic acid normal.  Patient has been anuric since admission.  Creatinine markedly elevated.  Nephrology consult called.  5/8: Patient seen and examined.  Patient transferred to ICU overnight  given concerns for hypotension.  On my evaluation this morning patient has a widened pulse pressure though systolics are approximately 120.  Patient is mentating clearly.  Serial lactic acid all within normal limits.  No indication for pressor support.  Case discussed with intensivist Dr Patsey Berthold.  Will transfer patient back to cardiac progressive unit. patient remains anuric.  Kidney function worsening.  Case discussed with nephrology.  No emergent need for dialysis however kidney function continues to worsen can consider dialysis catheter placement for acute dialysis tomorrow.  Respiratory status actually improving.  Patient breathing a little easier.  Patient stable for progressive cardiac unit at this time.  Patient has a widened pulse pressure which drives down the MAP.  Patient systolic blood pressure remains above 90 so she should be stable for progressive floor.  If there are concerns for worsening hypotension and shock recommend stat lactic acid.  Thus far lactic acids have been within normal limits indicating adequate perfusion.  5/9: Patient seen and examined.  Respiratory status appears worse this morning.  Patient with a little increased work of breathing.  Oxygen saturation adequate on 4 4 to half liters nasal cannula.  Still no urine output.  Systolics remain soft.  Stable bradycardia.  Case discussed with nephrology.  Considering lack of improvement in renal function likely to start dialysis within the next 24 hours.   Assessment & Plan:   Active Problems:   Multifocal pneumonia  Multifocal pneumonia. Patient was started on IV Rocephin and Zithromax on admission No fevers noted no worsening signs of infection Oxygen requirement worsened over interval Unclear with  this is infectious in nature Procalcitonin only mildly elevated Plan: Continue IV Rocephin Continue IV azithromycin Sputum Gram stain culture Urinary pneumonia antigens, unable to collect stool anuria Follow blood  cultures, no growth to date Mucolytic's Scheduled duoneb Oxygen as needed, wean as tolerated  Acute on chronic hypoxic respiratory failure secondary to pneumonia See above for acute management Chest x-ray with worsening infiltrate Unclear whether this is fluid or atelectasis Discussed with nephrology Stop bicarb Large Lasix challenge, 120 mg IV x1 Check chest CT Encourage incentive spirometry Wean oxygen as tolerated  Sepsis secondary to pneumonia without severe sepsis or septic shock. Manifested by hypothermia, hypotension, hypoxia Despite relatively low maps the patient appears to be perfusing with normal lactic acid Management as above  Acute kidney injury superimposed on stage IIIb chronic kidney disease Metabolic acidosis Patient received several liters crystalloid and colloid boluses without significant change in kidney function Creatinine actually worsened over interval Renal ultrasound no hydrobilateral renal cyst noted Nephrology consult called, case discussed with Dr. Candiss Norse, recommendations appreciated Plan: Stop bicarb No further albumin Large lasix challenge, 120mg  IV x 1 Likely HD required within the next 24 hours Discussed with nephrologist, patient and family at bedside  Atrial fibrillation Symptomatic bradycardia Chronic anticoagulation Patient on low-dose Eliquis No rate control agents appreciated on home med rec Patient has been relatively bradycardic with baseline rate in the 50s with occasional dropped into the 30s Plan: Cardiology consultation.  Patient is established with Dr. Saralyn Pilar from [place] [facility].  Dr. Nehemiah Massed on consult Avoid AV nodal blocking agents Clonidine discontinued Continue Eliquis Per cardiology no further diagnostics necessary at this time Continue telemetry  Dyslipidemia. Statin  Hypertension. All home antihypertensives on hold  Type 2 diabetes mellitus. Hold home Metformin Sliding scale  insulin  GERD. PPI   DVT prophylaxis: Eliquis Code Status: Full Family Communication: Daughter and granddaughter at bedside Disposition Plan: Status is: Inpatient  Remains inpatient appropriate because:Inpatient level of care appropriate due to severity of illness   Dispo: The patient is from: Home              Anticipated d/c is to: SNF              Anticipated d/c date is: > 3 days              Patient currently is not medically stable to d/c.   Worsening anuric kidney failure.  Likely to start hemodialysis with the next 24 hours.  Also worsening hypoxic respiratory failure.  Fluid versus infection versus atelectasis.  Patient with several medical comorbidities requiring inpatient management.  Discussed with patient and family the possibility of palliative care.  They are open to it.  Will consult palliative care on Monday, 01/19/2020.          Consultants:   Nephrology- [facility]  Cardiology-[facility]  Procedures:   none  Antimicrobials:   Rocephin (01/15/20-  )  Azithromycin (01/15/20-  )   Subjective: Seen and examined Mentating clearly C/o not feeling well Increased WOB  Objective: Vitals:   01/17/20 1649 01/17/20 2002 01/18/20 0334 01/18/20 0806  BP: (!) 129/47 110/61 (!) 111/43 (!) 138/50  Pulse: (!) 56 (!) 58 (!) 54 (!) 57  Resp: 16 20  16   Temp: 98.3 F (36.8 C) (!) 97.3 F (36.3 C)  (!) 97.5 F (36.4 C)  TempSrc:  Oral  Oral  SpO2: 96% 93% 90% 94%  Weight:   88.4 kg   Height:  Intake/Output Summary (Last 24 hours) at 01/18/2020 1135 Last data filed at 01/18/2020 0339 Gross per 24 hour  Intake 670.61 ml  Output 0 ml  Net 670.61 ml   Filed Weights   01/16/20 1130 01/16/20 2345 01/18/20 0334  Weight: 87.1 kg 86.6 kg 88.4 kg    Examination:  General exam: Appears calm and comfortable, appears frail Respiratory system: Bilateral crackles.  Increased work of breathing Cardiovascular system: S1 & S2 heard, RRR. No JVD, murmurs,  rubs, gallops or clicks. No pedal edema. Gastrointestinal system: Abdomen is nondistended, soft and nontender. No organomegaly or masses felt. Normal bowel sounds heard. Central nervous system: Alert and oriented. No focal neurological deficits. Extremities: Symmetric 5 x 5 power. Skin: No rashes, lesions or ulcers Psychiatry: Judgement and insight appear normal. Mood & affect appropriate.     Data Reviewed: I have personally reviewed following labs and imaging studies  CBC: Recent Labs  Lab 01/15/20 1847 01/16/20 0513 01/17/20 0723 01/18/20 0604  WBC 7.7 7.8 7.7 6.5  NEUTROABS 7.0  --  6.7 5.8  HGB 9.3* 8.3* 8.4* 8.6*  HCT 31.5* 27.7* 28.2* 28.0*  MCV 87.3 86.6 87.3 83.6  PLT 244 203 185 161   Basic Metabolic Panel: Recent Labs  Lab 01/15/20 1847 01/16/20 0513 01/16/20 2112 01/17/20 0723 01/18/20 0604  NA 137 138 138 138 141  K 4.6 4.7 4.0 3.8 3.5  CL 109 111 109 107 106  CO2 15* 16* 17* 20* 24  GLUCOSE 105* 90 125* 127* 130*  BUN 67* 66* 65* 65* 67*  CREATININE 5.16* 5.54* 5.50* 5.71* 5.75*  CALCIUM 8.3* 7.7* 7.3* 7.2* 7.3*   GFR: Estimated Creatinine Clearance: 8.1 mL/min (A) (by C-G formula based on SCr of 5.75 mg/dL (H)). Liver Function Tests: Recent Labs  Lab 01/15/20 1847  AST 15  ALT 15  ALKPHOS 79  BILITOT 0.9  PROT 6.4*  ALBUMIN 3.3*   No results for input(s): LIPASE, AMYLASE in the last 168 hours. No results for input(s): AMMONIA in the last 168 hours. Coagulation Profile: No results for input(s): INR, PROTIME in the last 168 hours. Cardiac Enzymes: No results for input(s): CKTOTAL, CKMB, CKMBINDEX, TROPONINI in the last 168 hours. BNP (last 3 results) No results for input(s): PROBNP in the last 8760 hours. HbA1C: Recent Labs    01/16/20 0513  HGBA1C 6.4*   CBG: Recent Labs  Lab 01/17/20 0739 01/17/20 1146 01/17/20 1558 01/17/20 2103 01/18/20 0808  GLUCAP 99 139* 119* 140* 124*   Lipid Profile: No results for input(s): CHOL,  HDL, LDLCALC, TRIG, CHOLHDL, LDLDIRECT in the last 72 hours. Thyroid Function Tests: Recent Labs    01/16/20 2130 01/17/20 0704  TSH 2.417  --   FREET4  --  1.15*   Anemia Panel: No results for input(s): VITAMINB12, FOLATE, FERRITIN, TIBC, IRON, RETICCTPCT in the last 72 hours. Sepsis Labs: Recent Labs  Lab 01/15/20 1946 01/15/20 2045 01/16/20 0805 01/16/20 1121 01/17/20 0703 01/17/20 0946 01/18/20 0604  PROCALCITON 0.17  --   --   --  0.18  --  0.17  LATICACIDVEN  --    < > 0.7 0.7 0.8 0.8  --    < > = values in this interval not displayed.    Recent Results (from the past 240 hour(s))  Respiratory Panel by RT PCR (Flu A&B, Covid) - Nasopharyngeal Swab     Status: None   Collection Time: 01/15/20  7:38 PM   Specimen: Nasopharyngeal Swab  Result Value Ref Range  Status   SARS Coronavirus 2 by RT PCR NEGATIVE NEGATIVE Final    Comment: (NOTE) SARS-CoV-2 target nucleic acids are NOT DETECTED. The SARS-CoV-2 RNA is generally detectable in upper respiratoy specimens during the acute phase of infection. The lowest concentration of SARS-CoV-2 viral copies this assay can detect is 131 copies/mL. A negative result does not preclude SARS-Cov-2 infection and should not be used as the sole basis for treatment or other patient management decisions. A negative result may occur with  improper specimen collection/handling, submission of specimen other than nasopharyngeal swab, presence of viral mutation(s) within the areas targeted by this assay, and inadequate number of viral copies (<131 copies/mL). A negative result must be combined with clinical observations, patient history, and epidemiological information. The expected result is Negative. Fact Sheet for Patients:  PinkCheek.be Fact Sheet for Healthcare Providers:  GravelBags.it This test is not yet ap proved or cleared by the [place] FDA and  has been authorized  for detection and/or diagnosis of SARS-CoV-2 by FDA under an Emergency Use Authorization (EUA). This EUA will remain  in effect (meaning this test can be used) for the duration of the COVID-19 declaration under Section 564(b)(1) of the Act, 21 U.S.C. section 360bbb-3(b)(1), unless the authorization is terminated or revoked sooner.    Influenza A by PCR NEGATIVE NEGATIVE Final   Influenza B by PCR NEGATIVE NEGATIVE Final    Comment: (NOTE) The Xpert Xpress SARS-CoV-2/FLU/RSV assay is intended as an aid in  the diagnosis of influenza from Nasopharyngeal swab specimens and  should not be used as a sole basis for treatment. Nasal washings and  aspirates are unacceptable for Xpert Xpress SARS-CoV-2/FLU/RSV  testing. Fact Sheet for Patients: PinkCheek.be Fact Sheet for Healthcare Providers: GravelBags.it This test is not yet approved or cleared by the [place] FDA and  has been authorized for detection and/or diagnosis of SARS-CoV-2 by  FDA under an Emergency Use Authorization (EUA). This EUA will remain  in effect (meaning this test can be used) for the duration of the  Covid-19 declaration under Section 564(b)(1) of the Act, 21  U.S.C. section 360bbb-3(b)(1), unless the authorization is  terminated or revoked. Performed at [facility], [address] [place], [place] [address]   Blood Culture (routine x 2)     Status: None (Preliminary result)   Collection Time: 01/15/20  7:46 PM   Specimen: BLOOD RIGHT WRIST  Result Value Ref Range Status   Specimen Description BLOOD RIGHT WRIST  Final   Special Requests BOTTLES DRAWN AEROBIC AND ANAEROBIC  Final   Culture   Final    NO GROWTH 3 DAYS Performed at [facility], [address], [place], [place] [address]    Report Status PENDING  Incomplete  Blood Culture (routine x 2)     Status: None (Preliminary result)   Collection Time: 01/16/20  5:26 AM    Specimen: Left Antecubital; Blood  Result Value Ref Range Status   Specimen Description LEFT ANTECUBITAL  Final   Special Requests   Final    BOTTLES DRAWN AEROBIC AND ANAEROBIC Blood Culture adequate volume   Culture   Final    NO GROWTH 2 DAYS Performed at [facility], [address] [place], [place] [address]    Report Status PENDING  Incomplete  MRSA PCR Screening     Status: None   Collection Time: 01/16/20 11:51 PM   Specimen: Nasopharyngeal  Result Value Ref Range Status   MRSA by PCR NEGATIVE NEGATIVE  Final    Comment:        The GeneXpert MRSA Assay (FDA approved for NASAL specimens only), is one component of a comprehensive MRSA colonization surveillance program. It is not intended to diagnose MRSA infection nor to guide or monitor treatment for MRSA infections. Performed at [facility], [address], [place], [place] [address]          Radiology Studies: No results found.      Scheduled Meds: . atorvastatin  80 mg Oral Daily  . calcium-vitamin D  1 tablet Oral Daily  . Chlorhexidine Gluconate Cloth  6 each Topical Daily  . [START ON 01/19/2020] cyanocobalamin  1,000 mcg Intramuscular Q30 days  . doxycycline  100 mg Oral Q12H  . ferrous sulfate  325 mg Oral Q lunch  . guaiFENesin  600 mg Oral BID  . heparin injection (subcutaneous)  5,000 Units Subcutaneous Q8H  . insulin aspart  0-9 Units Subcutaneous TID PC & HS  . ipratropium-albuterol  3 mL Nebulization Q6H  . pantoprazole  40 mg Oral Daily   Continuous Infusions: . cefTRIAXone (ROCEPHIN)  IV Stopped (01/17/20 2158)  . sodium bicarbonate 150 mEq in dextrose 5% 1000 mL 50 mL/hr at 01/17/20 2320     LOS: 3 days    Time spent: 35 minutes    Sidney Ace, MD [facility] Pager [phone]  If 7PM-7AM, please contact night-coverage 01/18/2020, 11:35 AM

## 2020-01-18 NOTE — Progress Notes (Signed)
[facility] Cardiology [facility] Encounter Note  Patient: Dorothy [name] / Admit Date: 01/15/2020 / Date of Encounter: 01/18/2020, 6:23 AM   Subjective: Patient feels relatively well this morning conversing without any difficulty breathing.  Patient has had significant hypotension which appears to have resolved blood pressure more stable today.  Patient has had a discontinuation of amlodipine and clonidine due to this issue.  Heart rate appears to be relatively stable at 50 to 60 bpm without evidence of significant long RR intervals or need for pacemaker placement at this time.  Patient's breathing is better after treatment of pulmonary infection.  Patient has had significant acute kidney injury without urine output and therefore may need dialysis as she continues to have difficulty  Review of Systems: Positive for: Shortness of breath Negative for: Vision change, hearing change, syncope, dizziness, nausea, vomiting,diarrhea, bloody stool, stomach pain, cough, congestion, diaphoresis, urinary frequency, urinary pain,skin lesions, skin rashes Others previously listed  Objective: Telemetry: Atrial fibrillation with slow ventricular rate Physical Exam: Blood pressure (!) 111/43, pulse (!) 54, temperature (!) 97.3 F (36.3 C), temperature source Oral, resp. rate 20, height 5\' 4"  (1.626 m), weight 88.4 kg, SpO2 90 %. Body mass index is 33.45 kg/m. General: Well developed, well nourished, in no acute distress. Head: Normocephalic, atraumatic, sclera non-icteric, no xanthomas, nares are without discharge. Neck: No apparent masses Lungs: Normal respirations with some wheezes, some rhonchi, no rales , few basilar crackles   Heart: Irregular rate and rhythm, normal S1 S2, no murmur, no rub, no gallop, PMI is normal size and placement, carotid upstroke normal without bruit, jugular venous pressure normal Abdomen: Soft, non-tender, non-distended with normoactive bowel sounds. No hepatosplenomegaly.  Abdominal aorta is normal size without bruit Extremities: 1+ edema, no clubbing, no cyanosis, no ulcers,  Peripheral: 2+ radial, 2+ femoral, 2+ dorsal pedal pulses Neuro: Alert and oriented. Moves all extremities spontaneously. Psych:  Responds to questions appropriately with a normal affect.   Intake/Output Summary (Last 24 hours) at 01/18/2020 0623 Last data filed at 01/18/2020 0339 Gross per 24 hour  Intake 1154.76 ml  Output 0 ml  Net 1154.76 ml    Inpatient Medications:  . atorvastatin  80 mg Oral Daily  . calcium-vitamin D  1 tablet Oral Daily  . Chlorhexidine Gluconate Cloth  6 each Topical Daily  . [START ON 01/19/2020] cyanocobalamin  1,000 mcg Intramuscular Q30 days  . doxycycline  100 mg Oral Q12H  . ferrous sulfate  325 mg Oral Q lunch  . guaiFENesin  600 mg Oral BID  . insulin aspart  0-9 Units Subcutaneous TID PC & HS  . pantoprazole  40 mg Oral Daily   Infusions:  . cefTRIAXone (ROCEPHIN)  IV Stopped (01/17/20 2158)  . sodium bicarbonate 150 mEq in dextrose 5% 1000 mL 50 mL/hr at 01/17/20 2320    Labs: Recent Labs    01/16/20 2112 01/17/20 0723  NA 138 138  K 4.0 3.8  CL 109 107  CO2 17* 20*  GLUCOSE 125* 127*  BUN 65* 65*  CREATININE 5.50* 5.71*  CALCIUM 7.3* 7.2*   Recent Labs    01/15/20 1847  AST 15  ALT 15  ALKPHOS 79  BILITOT 0.9  PROT 6.4*  ALBUMIN 3.3*   Recent Labs    01/15/20 1847 01/15/20 1847 01/16/20 0513 01/17/20 0723  WBC 7.7   < > 7.8 7.7  NEUTROABS 7.0  --   --  6.7  HGB 9.3*   < > 8.3* 8.4*  HCT  31.5*   < > 27.7* 28.2*  MCV 87.3   < > 86.6 87.3  PLT 244   < > 203 185   < > = values in this interval not displayed.   No results for input(s): CKTOTAL, CKMB, TROPONINI in the last 72 hours. Invalid input(s): POCBNP Recent Labs    01/16/20 0513  HGBA1C 6.4*     Weights: Filed Weights   01/16/20 1130 01/16/20 2345 01/18/20 0334  Weight: 87.1 kg 86.6 kg 88.4 kg     Radiology/Studies:  US RENAL  Result Date:  01/16/2020 CLINICAL DATA:  85 year old female with a history of acute kidney injury EXAM: RENAL / URINARY TRACT ULTRASOUND COMPLETE COMPARISON:  04/03/2008 FINDINGS: Right Kidney: Length: 11.3 cm x 5.6 cm x 4.8 cm, 157 cc. No hydronephrosis. Flow confirmed in the hilum. Cystic structure with through transmission of the right upper pole cortex measures 6.1 cm x 3.0 cm x 3.4 cm. Left Kidney: Length: 12.6 cm x 5.0 cm x 5.9 cm, 193 cc. No hydronephrosis. Flow confirmed in the hilum. Cystic structure in the superior left kidney with through transmission and no internal complexity or flow measures 5.2 cm x 5.0 cm by 4.8 cm. Bladder: Appears normal for degree of bladder distention. IMPRESSION: Negative for hydronephrosis of the bilateral kidneys. Right-sided Bosniak 2 cyst and a left-sided Bosniak 1 cyst. Electronically Signed   By: Corrie Mckusick D.O.   On: 01/16/2020 09:21   DG Chest Portable 1 View  Result Date: 01/15/2020 CLINICAL DATA:  Shortness of breath and weakness. EXAM: PORTABLE CHEST 1 VIEW COMPARISON:  10/06/2019 FINDINGS: The cardiac silhouette remains borderline enlarged. Linear opacities are again demonstrated in the mid and lower lung zones on the left, resolved in the upper lung zones. Interval mild patchy density at the right lung base. Mildly increased patchy density at the left lung base with possible small bilateral pleural effusions. Diffuse osteopenia. IMPRESSION: 1. Interval mild patchy atelectasis or pneumonia at the right lung base. 2. Mildly increased patchy atelectasis or pneumonia at the left lung base. 3. Possible small bilateral pleural effusions. 4. Stable borderline cardiomegaly. Electronically Signed   By: Claudie Revering M.D.   On: 01/15/2020 19:12   ECHOCARDIOGRAM COMPLETE  Result Date: 01/16/2020    ECHOCARDIOGRAM REPORT   Patient Name:   Dorothy [name] Date of Exam: 01/16/2020 Medical Rec #:  [id number]       Height:       66.0 in Accession #:    [id number]      Weight:       193.3 lb  Date of Birth:  [birth year]       BSA:          1.971 m Patient Age:    85 years        BP:           112/23 mmHg Patient Gender: F               HR:           54 bpm. Exam Location:  [facility] Procedure: 2D Echo, Color Doppler and Cardiac Doppler Indications:     I50.31 CHF-Acute Diastolic  History:         Patient has prior history of Echocardiogram examinations. COPD;                  Risk Factors:Sleep Apnea, Hypertension, Diabetes and  Dyslipidemia.  Sonographer:     Charmayne Sheer RDCS (AE) Referring Phys:  [id number] Sidney Ace Diagnosing Phys: Serafina Royals MD  Sonographer Comments: No subcostal window and suboptimal apical window. Image acquisition challenging due to patient body habitus and Image acquisition challenging due to COPD. IMPRESSIONS  1. Left ventricular ejection fraction, by estimation, is 55 to 60%. The left ventricle has normal function. The left ventricle has no regional wall motion abnormalities. Left ventricular diastolic function could not be evaluated.  2. Right ventricular systolic function is normal. The right ventricular size is normal. There is mildly elevated pulmonary artery systolic pressure.  3. Left atrial size was mildly dilated.  4. Right atrial size was mildly dilated.  5. The mitral valve is normal in structure. Mild mitral valve regurgitation.  6. The aortic valve is normal in structure. Aortic valve regurgitation is trivial. FINDINGS  Left Ventricle: Left ventricular ejection fraction, by estimation, is 55 to 60%. The left ventricle has normal function. The left ventricle has no regional wall motion abnormalities. The left ventricular internal cavity size was normal in size. There is  no left ventricular hypertrophy. Left ventricular diastolic function could not be evaluated. Right Ventricle: The right ventricular size is normal. No increase in right ventricular wall thickness. Right ventricular systolic function is normal. There is mildly elevated pulmonary  artery systolic pressure. The tricuspid regurgitant velocity is 2.81  m/s, and with an assumed right atrial pressure of 10 mmHg, the estimated right ventricular systolic pressure is 83.4 mmHg. Left Atrium: Left atrial size was mildly dilated. Right Atrium: Right atrial size was mildly dilated. Pericardium: There is no evidence of pericardial effusion. Mitral Valve: The mitral valve is normal in structure. Mild mitral valve regurgitation. MV peak gradient, 13.7 mmHg. The mean mitral valve gradient is 3.0 mmHg. Tricuspid Valve: The tricuspid valve is normal in structure. Tricuspid valve regurgitation is mild. Aortic Valve: The aortic valve is normal in structure. Aortic valve regurgitation is trivial. Aortic valve mean gradient measures 7.0 mmHg. Aortic valve peak gradient measures 17.3 mmHg. Aortic valve area, by VTI measures 2.07 cm. Pulmonic Valve: The pulmonic valve was normal in structure. Pulmonic valve regurgitation is not visualized. Aorta: The aortic root and ascending aorta are structurally normal, with no evidence of dilitation. IAS/Shunts: No atrial level shunt detected by color flow Doppler.  LEFT VENTRICLE PLAX 2D LVIDd:         4.47 cm  Diastology LVIDs:         2.53 cm  LV e' lateral:   6.74 cm/s LV PW:         1.04 cm  LV E/e' lateral: 25.4 LV IVS:        0.90 cm  LV e' medial:    5.22 cm/s LVOT diam:     1.90 cm  LV E/e' medial:  32.9 LV SV:         92 LV SV Index:   47 LVOT Area:     2.84 cm  RIGHT VENTRICLE RV Basal diam:  2.85 cm LEFT ATRIUM             Index       RIGHT ATRIUM           Index LA diam:        5.30 cm 2.69 cm/m  RA Area:     15.30 cm LA Vol (A2C):   62.9 ml 31.91 ml/m RA Volume:   37.30 ml  18.92 ml/m LA Vol (A4C):  64.9 ml 32.92 ml/m LA Biplane Vol: 64.9 ml 32.92 ml/m  AORTIC VALVE                    PULMONIC VALVE AV Area (Vmax):    2.18 cm     PV Vmax:       1.42 m/s AV Area (Vmean):   2.08 cm     PV Vmean:      90.400 cm/s AV Area (VTI):     2.07 cm     PV VTI:         0.317 m AV Vmax:           208.00 cm/s  PV Peak grad:  8.1 mmHg AV Vmean:          125.000 cm/s PV Mean grad:  4.0 mmHg AV VTI:            0.445 m AV Peak Grad:      17.3 mmHg AV Mean Grad:      7.0 mmHg LVOT Vmax:         160.00 cm/s LVOT Vmean:        91.500 cm/s LVOT VTI:          0.325 m LVOT/AV VTI ratio: 0.73  AORTA Ao Root diam: 2.90 cm MITRAL VALVE                TRICUSPID VALVE MV Area (PHT): 2.31 cm     TR Peak grad:   31.6 mmHg MV Peak grad:  13.7 mmHg    TR Vmax:        281.00 cm/s MV Mean grad:  3.0 mmHg MV Vmax:       1.85 m/s     SHUNTS MV Vmean:      62.1 cm/s    Systemic VTI:  0.32 m MV Decel Time: 329 msec     Systemic Diam: 1.90 cm MV E velocity: 171.50 cm/s Serafina Royals MD Electronically signed by Serafina Royals MD Signature Date/Time: 01/16/2020/3:52:32 PM    Final      Assessment and Recommendation  85 y.o. female with chronic nonvalvular atrial fibrillation with slow ventricular rate chronic kidney disease stage V anemia with acute respiratory failure likely due to infection pneumonia slightly improved with oxygenation with need for possible renal replacement 1.  No medication management for blood pressure due to hypotension and concerns 2.  Continue anticoagulation for further risk reduction in stroke with atrial fibrillation 3.  Watch telemetry for any significant long RR intervals or need for further intervention in the future with slow ventricular rate of atrial fibrillation which appeared to be more stable throughout the last 48 hours 4.  No further cardiac diagnostics necessary at this time 5.  Okay for renal replacement as necessary to improve above  Signed, Serafina Royals M.D. FACC

## 2020-01-18 NOTE — Progress Notes (Signed)
[facility]  ROUNDING NOTE   Subjective:  Patient continues to have very diminished renal function. Creatinine 5.75. No urine output. Patient found to have bilateral groundglass opacities which may represent pneumonia. IV fluids stopped.  Objective:  Vital signs in last 24 hours:  Temp:  [97.3 F (36.3 C)-98.3 F (36.8 C)] 98.1 F (36.7 C) (05/09 1211) Pulse Rate:  [54-59] 59 (05/09 1211) Resp:  [16-20] 18 (05/09 1211) BP: (110-146)/(43-61) 146/57 (05/09 1211) SpO2:  [90 %-96 %] 90 % (05/09 1211) Weight:  [88.4 kg] 88.4 kg (05/09 0334)  Weight change: 1.27 kg Filed Weights   01/16/20 1130 01/16/20 2345 01/18/20 0334  Weight: 87.1 kg 86.6 kg 88.4 kg    Intake/Output: I/O last 3 completed shifts: In: 2038.7 [I.V.:1419.8; IV Piggyback:618.9] Out: 0    Intake/Output this shift:  No intake/output data recorded.  Physical Exam: General: No acute distress  Head: Normocephalic, atraumatic. Moist oral mucosal membranes  Eyes: Anicteric  Neck: Supple, trachea midline  Lungs:  Clear to auscultation, normal effort  Heart: S1S2 irregular  Abdomen:  Soft, nontender, bowel sounds present  Extremities: Trace peripheral edema.  Neurologic: Awake, alert, following commands  Skin: No lesions  Access: None    Basic Metabolic Panel: Recent Labs  Lab 01/15/20 1847 01/15/20 1847 01/16/20 0513 01/16/20 0513 01/16/20 2112 01/17/20 0723 01/18/20 0604  NA 137  --  138  --  138 138 141  K 4.6  --  4.7  --  4.0 3.8 3.5  CL 109  --  111  --  109 107 106  CO2 15*  --  16*  --  17* 20* 24  GLUCOSE 105*  --  90  --  125* 127* 130*  BUN 67*  --  66*  --  65* 65* 67*  CREATININE 5.16*  --  5.54*  --  5.50* 5.71* 5.75*  CALCIUM 8.3*   < > 7.7*   < > 7.3* 7.2* 7.3*   < > = values in this interval not displayed.    Liver Function Tests: Recent Labs  Lab 01/15/20 1847  AST 15  ALT 15  ALKPHOS 79  BILITOT 0.9  PROT 6.4*  ALBUMIN 3.3*   No results for input(s):  LIPASE, AMYLASE in the last 168 hours. No results for input(s): AMMONIA in the last 168 hours.  CBC: Recent Labs  Lab 01/15/20 1847 01/16/20 0513 01/17/20 0723 01/18/20 0604  WBC 7.7 7.8 7.7 6.5  NEUTROABS 7.0  --  6.7 5.8  HGB 9.3* 8.3* 8.4* 8.6*  HCT 31.5* 27.7* 28.2* 28.0*  MCV 87.3 86.6 87.3 83.6  PLT 244 203 185 189    Cardiac Enzymes: No results for input(s): CKTOTAL, CKMB, CKMBINDEX, TROPONINI in the last 168 hours.  BNP: Invalid input(s): POCBNP  CBG: Recent Labs  Lab 01/17/20 1146 01/17/20 1558 01/17/20 2103 01/18/20 0808 01/18/20 1208  GLUCAP 139* 119* 140* 124* 102*    Microbiology: Results for orders placed or performed during the hospital encounter of 01/15/20  Respiratory Panel by RT PCR (Flu A&B, Covid) - Nasopharyngeal Swab     Status: None   Collection Time: 01/15/20  7:38 PM   Specimen: Nasopharyngeal Swab  Result Value Ref Range Status   SARS Coronavirus 2 by RT PCR NEGATIVE NEGATIVE Final    Comment: (NOTE) SARS-CoV-2 target nucleic acids are NOT DETECTED. The SARS-CoV-2 RNA is generally detectable in upper respiratoy specimens during the acute phase of infection. The lowest concentration of SARS-CoV-2 viral copies  this assay can detect is 131 copies/mL. A negative result does not preclude SARS-Cov-2 infection and should not be used as the sole basis for treatment or other patient management decisions. A negative result may occur with  improper specimen collection/handling, submission of specimen other than nasopharyngeal swab, presence of viral mutation(s) within the areas targeted by this assay, and inadequate number of viral copies (<131 copies/mL). A negative result must be combined with clinical observations, patient history, and epidemiological information. The expected result is Negative. Fact Sheet for Patients:  PinkCheek.be Fact Sheet for Healthcare Providers:   GravelBags.it This test is not yet ap proved or cleared by the [place] FDA and  has been authorized for detection and/or diagnosis of SARS-CoV-2 by FDA under an Emergency Use Authorization (EUA). This EUA will remain  in effect (meaning this test can be used) for the duration of the COVID-19 declaration under Section 564(b)(1) of the Act, 21 U.S.C. section 360bbb-3(b)(1), unless the authorization is terminated or revoked sooner.    Influenza A by PCR NEGATIVE NEGATIVE Final   Influenza B by PCR NEGATIVE NEGATIVE Final    Comment: (NOTE) The Xpert Xpress SARS-CoV-2/FLU/RSV assay is intended as an aid in  the diagnosis of influenza from Nasopharyngeal swab specimens and  should not be used as a sole basis for treatment. Nasal washings and  aspirates are unacceptable for Xpert Xpress SARS-CoV-2/FLU/RSV  testing. Fact Sheet for Patients: PinkCheek.be Fact Sheet for Healthcare Providers: GravelBags.it This test is not yet approved or cleared by the [place] FDA and  has been authorized for detection and/or diagnosis of SARS-CoV-2 by  FDA under an Emergency Use Authorization (EUA). This EUA will remain  in effect (meaning this test can be used) for the duration of the  Covid-19 declaration under Section 564(b)(1) of the Act, 21  U.S.C. section 360bbb-3(b)(1), unless the authorization is  terminated or revoked. Performed at [facility], [address] [place], [place] [address]   Blood Culture (routine x 2)     Status: None (Preliminary result)   Collection Time: 01/15/20  7:46 PM   Specimen: BLOOD RIGHT WRIST  Result Value Ref Range Status   Specimen Description BLOOD RIGHT WRIST  Final   Special Requests BOTTLES DRAWN AEROBIC AND ANAEROBIC  Final   Culture   Final    NO GROWTH 3 DAYS Performed at [facility], [address] [place], [place] [address]    Report  Status PENDING  Incomplete  Blood Culture (routine x 2)     Status: None (Preliminary result)   Collection Time: 01/16/20  5:26 AM   Specimen: Left Antecubital; Blood  Result Value Ref Range Status   Specimen Description LEFT ANTECUBITAL  Final   Special Requests   Final    BOTTLES DRAWN AEROBIC AND ANAEROBIC Blood Culture adequate volume   Culture   Final    NO GROWTH 2 DAYS Performed at [facility], [address], [place],  [address]    Report Status PENDING  Incomplete  MRSA PCR Screening     Status: None   Collection Time: 01/16/20 11:51 PM   Specimen: Nasopharyngeal  Result Value Ref Range Status   MRSA by PCR NEGATIVE NEGATIVE Final    Comment:        The GeneXpert MRSA Assay (FDA approved for NASAL specimens only), is one component of a comprehensive MRSA colonization surveillance program. It is not intended to diagnose MRSA infection nor to guide or monitor treatment for  MRSA infections. Performed at [facility], [address] [place], [place] [address]     Coagulation Studies: No results for input(s): LABPROT, INR in the last 72 hours.  Urinalysis: No results for input(s): COLORURINE, LABSPEC, PHURINE, GLUCOSEU, HGBUR, BILIRUBINUR, KETONESUR, PROTEINUR, UROBILINOGEN, NITRITE, LEUKOCYTESUR in the last 72 hours.  Invalid input(s): APPERANCEUR    Imaging: CT CHEST WO CONTRAST  Result Date: 01/18/2020 CLINICAL DATA:  Shortness of breath EXAM: CT CHEST WITHOUT CONTRAST TECHNIQUE: Multidetector CT imaging of the chest was performed following the standard protocol without IV contrast. COMPARISON:  Chest radiograph dated 01/18/2020 FINDINGS: Cardiovascular: The main pulmonary artery is mildly enlarged, measuring 3.1 cm. Vascular calcifications are seen in the aortic arch. The heart is enlarged. No pericardial effusion. Mediastinum/Nodes: No enlarged mediastinal or axillary lymph nodes. Thyroid gland, trachea, and esophagus demonstrate no  significant findings. Lungs/Pleura: There are moderate bilateral pleural effusions with associated atelectasis. Bilateral parenchymal scarring is seen with superimposed atelectasis, ground-glass opacities, and consolidation. There is no pneumothorax. Upper Abdomen: No acute abnormality. Musculoskeletal: Age indeterminate anterior wedge deformities of T10 and T12 are progressed since 06/24/2015. There is focal kyphosis centered at T11-12. IMPRESSION: 1. Bilateral ground-glass opacities and consolidation may represent pneumonia. Moderate bilateral pleural effusions with associated atelectasis. 2. Cardiomegaly. Mildly enlarged main pulmonary artery suggestive of pulmonary hypertension. 3. Age indeterminate anterior wedge deformities of T10 and T12. Aortic Atherosclerosis (ICD10-I70.0). Electronically Signed   By: Zerita Boers M.D.   On: 01/18/2020 14:50   DG Chest Port 1 View  Result Date: 01/18/2020 CLINICAL DATA:  Shortness of breath and hypoxia. EXAM: PORTABLE CHEST 1 VIEW COMPARISON:  01/15/2020 FINDINGS: Stable cardiomegaly. Increased confluent opacity is seen in the left lower lung since prior exam. Left pleural effusion cannot be excluded. Diffuse interstitial infiltrates show no significant change. IMPRESSION: 1. Increased confluent opacity in left lower lung. Left pleural effusion cannot be excluded. 2. Stable cardiomegaly and diffuse interstitial infiltrates, suspicious for edema. Electronically Signed   By: Marlaine Hind M.D.   On: 01/18/2020 12:24     Medications:   . cefTRIAXone (ROCEPHIN)  IV Stopped (01/17/20 2158)   . atorvastatin  80 mg Oral Daily  . calcium-vitamin D  1 tablet Oral Daily  . Chlorhexidine Gluconate Cloth  6 each Topical Daily  . ciprofloxacin-dexamethasone  4 drop Left EAR BID  . [START ON 01/19/2020] cyanocobalamin  1,000 mcg Intramuscular Q30 days  . doxycycline  100 mg Oral Q12H  . ferrous sulfate  325 mg Oral Q lunch  . guaiFENesin  600 mg Oral BID  . heparin  injection (subcutaneous)  5,000 Units Subcutaneous Q8H  . insulin aspart  0-9 Units Subcutaneous TID WC  . ipratropium-albuterol  3 mL Nebulization Q6H  . pantoprazole  40 mg Oral Daily   acetaminophen **OR** acetaminophen, albuterol, ondansetron **OR** ondansetron (ZOFRAN) IV, traZODone  Assessment/ Plan:  85 y.o. female with atrial fibrillation, congestive heart failure, COPD, diabetes mellitus, hypertension, obstructive sleep apnea, chronic kidney disease stage IIIb baseline EGFR 35 who was admitted with acute kidney injury, multifocal pneumonia.  1.  Acute kidney injury/chronic kidney disease stage IIIb baseline creatinine 1.4/EGFR 35.  Renal ultrasound negative for hydronephrosis but bilateral cysts noted. -Given ongoing significant renal dysfunction we do feel that she will need renal placement therapy.  This was discussed in depth with the patient as well as her daughter.  They have agreed to initiation of renal placement therapy in the form of hemodialysis.  As such we will request vascular  surgery consultation for PermCath placement tomorrow.  N.p.o. after midnight.  2.  Metabolic acidosis.  Patient was having increasing shortness of breath therefore we discontinued IV fluids.  3.  Anemia of chronic kidney disease.  Hemoglobin 8.6 at last check.  Consider Epogen once the patient is on dialysis.   LOS: 3 Amor Hyle 5/9/20214:12 PM

## 2020-01-18 NOTE — Progress Notes (Signed)
MD and Neph notified. Pt has small amount of urine output via ext cath. RN also bladder scanned at 91 ml/hr. Neph will continue with plan to place temp cath and HD tomorrow. I will continue to assess.

## 2020-01-18 NOTE — Progress Notes (Signed)
CCMD reports heartrate in upper 20s. RN is at bedside. Apical pulse shows 59 bpm. All leads checked for connection. MD notifed. No new orders I will continue to assess.

## 2020-01-19 ENCOUNTER — Inpatient Hospital Stay
Admission: EM | Disposition: A | Discharge status: Skilled Nursing Facility | Payer: Self-pay | Source: Home / Self Care | Attending: Internal Medicine

## 2020-01-19 ENCOUNTER — Other Ambulatory Visit (INDEPENDENT_AMBULATORY_CARE_PROVIDER_SITE_OTHER): Payer: Self-pay | Admitting: Vascular Surgery

## 2020-01-19 DIAGNOSIS — I5032 Chronic diastolic (congestive) heart failure: Secondary | ICD-10-CM

## 2020-01-19 DIAGNOSIS — Z515 Encounter for palliative care: Secondary | ICD-10-CM

## 2020-01-19 DIAGNOSIS — Z66 Do not resuscitate: Secondary | ICD-10-CM

## 2020-01-19 DIAGNOSIS — Z7189 Other specified counseling: Secondary | ICD-10-CM

## 2020-01-19 DIAGNOSIS — R0902 Hypoxemia: Secondary | ICD-10-CM

## 2020-01-19 DIAGNOSIS — N185 Chronic kidney disease, stage 5: Secondary | ICD-10-CM

## 2020-01-19 HISTORY — PX: DIALYSIS/PERMA CATHETER INSERTION: CATH118288

## 2020-01-19 LAB — BASIC METABOLIC PANEL
Anion gap: 13 (ref 5–15)
BUN: 69 mg/dL — ABNORMAL HIGH (ref 8–23)
CO2: 24 mmol/L (ref 22–32)
Calcium: 7 mg/dL — ABNORMAL LOW (ref 8.9–10.3)
Chloride: 103 mmol/L (ref 98–111)
Creatinine, Ser: 5.49 mg/dL — ABNORMAL HIGH (ref 0.44–1.00)
GFR calc Af Amer: 8 mL/min — ABNORMAL LOW (ref >60)
GFR calc non Af Amer: 7 mL/min — ABNORMAL LOW (ref >60)
Glucose, Bld: 118 mg/dL — ABNORMAL HIGH (ref 70–99)
Potassium: 3.6 mmol/L (ref 3.5–5.1)
Sodium: 140 mmol/L (ref 135–145)

## 2020-01-19 LAB — GLUCOSE, CAPILLARY
Glucose-Capillary: 113 mg/dL — ABNORMAL HIGH (ref 70–99)
Glucose-Capillary: 117 mg/dL — ABNORMAL HIGH (ref 70–99)
Glucose-Capillary: 112 mg/dL — ABNORMAL HIGH (ref 70–99)
Glucose-Capillary: 93 mg/dL (ref 70–99)

## 2020-01-19 LAB — CBC WITH DIFFERENTIAL/PLATELET
Abs Immature Granulocytes: 0.02 10*3/uL (ref 0.00–0.07)
Basophils Absolute: 0 10*3/uL (ref 0.0–0.1)
Basophils Relative: 0 %
Eosinophils Absolute: 0.1 10*3/uL (ref 0.0–0.5)
Eosinophils Relative: 1 %
HCT: 28.8 % — ABNORMAL LOW (ref 36.0–46.0)
Hemoglobin: 8.7 g/dL — ABNORMAL LOW (ref 12.0–15.0)
Immature Granulocytes: 0 %
Lymphocytes Relative: 5 %
Lymphs Abs: 0.3 10*3/uL — ABNORMAL LOW (ref 0.7–4.0)
MCH: 25.8 pg — ABNORMAL LOW (ref 26.0–34.0)
MCHC: 30.2 g/dL (ref 30.0–36.0)
MCV: 85.5 fL (ref 80.0–100.0)
Monocytes Absolute: 0.5 10*3/uL (ref 0.1–1.0)
Monocytes Relative: 7 %
Neutro Abs: 6 10*3/uL (ref 1.7–7.7)
Neutrophils Relative %: 87 %
Platelets: 187 10*3/uL (ref 150–400)
RBC: 3.37 MIL/uL — ABNORMAL LOW (ref 3.87–5.11)
RDW: 15.7 % — ABNORMAL HIGH (ref 11.5–15.5)
WBC: 6.9 10*3/uL (ref 4.0–10.5)
nRBC: 0 % (ref 0.0–0.2)

## 2020-01-19 LAB — STREP PNEUMONIAE URINARY ANTIGEN: Strep Pneumo Urinary Antigen: NEGATIVE

## 2020-01-19 LAB — C4 COMPLEMENT: Complement C4, Body Fluid: 25 mg/dL (ref 12–38)

## 2020-01-19 LAB — C3 COMPLEMENT: C3 Complement: 74 mg/dL — ABNORMAL LOW (ref 82–167)

## 2020-01-19 LAB — KAPPA/LAMBDA LIGHT CHAINS
Kappa free light chain: 100.6 mg/L — ABNORMAL HIGH (ref 3.3–19.4)
Kappa, lambda light chain ratio: 2.11 — ABNORMAL HIGH (ref 0.26–1.65)
Lambda free light chains: 47.6 mg/L — ABNORMAL HIGH (ref 5.7–26.3)

## 2020-01-19 LAB — PHOSPHORUS: Phosphorus: 5.9 mg/dL — ABNORMAL HIGH (ref 2.5–4.6)

## 2020-01-19 SURGERY — DIALYSIS/PERMA CATHETER INSERTION
Anesthesia: Choice

## 2020-01-19 MED ORDER — LIDOCAINE HCL (PF) 1 % IJ SOLN
5.0000 mL | INTRAMUSCULAR | Status: DC | PRN
  Filled 2020-01-19 (×2): qty 5

## 2020-01-19 MED ORDER — METHYLPREDNISOLONE SODIUM SUCC 125 MG IJ SOLR: 125.0000 mg | Freq: Once | INTRAMUSCULAR | Status: DC | PRN

## 2020-01-19 MED ORDER — HEPARIN SODIUM (PORCINE) 1000 UNIT/ML DIALYSIS
1000.0000 [IU] | INTRAMUSCULAR | Status: DC | PRN
  Filled 2020-01-19 (×2): qty 1

## 2020-01-19 MED ORDER — ONDANSETRON HCL 4 MG/2ML IJ SOLN: 4.0000 mg | Freq: Four times a day (QID) | INTRAMUSCULAR | Status: DC | PRN

## 2020-01-19 MED ORDER — SODIUM CHLORIDE 0.9 % IV SOLN: INTRAVENOUS | Status: DC

## 2020-01-19 MED ORDER — MIDAZOLAM HCL 5 MG/5ML IJ SOLN
INTRAMUSCULAR | Status: AC
  Filled 2020-01-19: qty 5

## 2020-01-19 MED ORDER — CHLORHEXIDINE GLUCONATE CLOTH 2 % EX PADS: 6.0000 | MEDICATED_PAD | Freq: Every day | CUTANEOUS | Status: DC

## 2020-01-19 MED ORDER — DIPHENHYDRAMINE HCL 50 MG/ML IJ SOLN: 50.0000 mg | Freq: Once | INTRAMUSCULAR | Status: DC | PRN

## 2020-01-19 MED ORDER — SODIUM CHLORIDE 0.9 % IV SOLN: 100.0000 mL | INTRAVENOUS | Status: DC | PRN

## 2020-01-19 MED ORDER — PENTAFLUOROPROP-TETRAFLUOROETH EX AERO
1.0000 "application " | INHALATION_SPRAY | CUTANEOUS | Status: DC | PRN
  Filled 2020-01-19: qty 30

## 2020-01-19 MED ORDER — SODIUM CHLORIDE 3 % IN NEBU
4.0000 mL | INHALATION_SOLUTION | Freq: Once | RESPIRATORY_TRACT | Status: AC
  Administered 2020-01-19: 15:00:00 4 mL via RESPIRATORY_TRACT
  Filled 2020-01-19: qty 4

## 2020-01-19 MED ORDER — FENTANYL CITRATE (PF) 100 MCG/2ML IJ SOLN
INTRAMUSCULAR | Status: AC
  Filled 2020-01-19: qty 2

## 2020-01-19 MED ORDER — ALTEPLASE 2 MG IJ SOLR: 2.0000 mg | Freq: Once | INTRAMUSCULAR | Status: DC | PRN

## 2020-01-19 MED ORDER — HEPARIN SODIUM (PORCINE) 10000 UNIT/ML IJ SOLN
INTRAMUSCULAR | Status: AC
  Filled 2020-01-19: qty 1

## 2020-01-19 MED ORDER — FAMOTIDINE 20 MG PO TABS: 40.0000 mg | ORAL_TABLET | Freq: Once | ORAL | Status: DC | PRN

## 2020-01-19 MED ORDER — MIDAZOLAM HCL 2 MG/2ML IJ SOLN
INTRAMUSCULAR | Status: DC | PRN
  Administered 2020-01-19: 1 mg via INTRAVENOUS

## 2020-01-19 MED ORDER — CEFAZOLIN SODIUM-DEXTROSE 1-4 GM/50ML-% IV SOLN
1.0000 g | Freq: Once | INTRAVENOUS | Status: AC
  Administered 2020-01-19: 1 g via INTRAVENOUS
  Filled 2020-01-19: qty 50

## 2020-01-19 MED ORDER — MIDAZOLAM HCL 2 MG/ML PO SYRP: 8.0000 mg | ORAL_SOLUTION | Freq: Once | ORAL | Status: DC | PRN

## 2020-01-19 MED ORDER — LIDOCAINE-PRILOCAINE 2.5-2.5 % EX CREA
1.0000 "application " | TOPICAL_CREAM | CUTANEOUS | Status: DC | PRN
  Filled 2020-01-19: qty 5

## 2020-01-19 MED ORDER — FENTANYL CITRATE (PF) 100 MCG/2ML IJ SOLN
INTRAMUSCULAR | Status: DC | PRN
  Administered 2020-01-19: 25 ug via INTRAVENOUS

## 2020-01-19 MED ORDER — SODIUM CHLORIDE 0.9 % IV SOLN
INTRAVENOUS | Status: DC | PRN
  Administered 2020-01-19 – 2020-01-20 (×2): 250 mL via INTRAVENOUS

## 2020-01-19 SURGICAL SUPPLY — 2 items
CATH CANNON HEMO 15FR 19 (HEMODIALYSIS SUPPLIES) ×2 IMPLANT
PACK ANGIOGRAPHY (CUSTOM PROCEDURE TRAY) ×2 IMPLANT

## 2020-01-19 NOTE — Progress Notes (Signed)
[facility]  ROUNDING NOTE   Subjective:  Patient due for PermCath placement today. Appears to be feeling a bit better today. However still has minimal urine output.  Objective:  Vital signs in last 24 hours:  Temp:  [97.7 F (36.5 C)-98.8 F (37.1 C)] 97.9 F (36.6 C) (05/10 1223) Pulse Rate:  [58-75] 75 (05/10 1223) Resp:  [16-19] 19 (05/10 1223) BP: (121-134)/(43-71) 133/55 (05/10 1223) SpO2:  [88 %-98 %] 88 % (05/10 1223) Weight:  [88.1 kg] 88.1 kg (05/10 0400)  Weight change: -0.272 kg Filed Weights   01/16/20 2345 01/18/20 0334 01/19/20 0400  Weight: 86.6 kg 88.4 kg 88.1 kg    Intake/Output: I/O last 3 completed shifts: In: 691.1 [I.V.:420.7; IV Piggyback:270.4] Out: 950 [Urine:950]   Intake/Output this shift:  Total I/O In: -  Out: 350 [Urine:350]  Physical Exam: General: No acute distress  Head: Normocephalic, atraumatic. Moist oral mucosal membranes  Eyes: Anicteric  Neck: Supple, trachea midline  Lungs:  Clear to auscultation, normal effort  Heart: S1S2 irregular  Abdomen:  Soft, nontender, bowel sounds present  Extremities: Trace peripheral edema.  Neurologic: Awake, alert, following commands  Skin: No lesions  Access: None    Basic Metabolic Panel: Recent Labs  Lab 01/16/20 0513 01/16/20 0513 01/16/20 2112 01/16/20 2112 01/17/20 0723 01/18/20 0604 01/19/20 0707  NA 138  --  138  --  138 141 140  K 4.7  --  4.0  --  3.8 3.5 3.6  CL 111  --  109  --  107 106 103  CO2 16*  --  17*  --  20* 24 24  GLUCOSE 90  --  125*  --  127* 130* 118*  BUN 66*  --  65*  --  65* 67* 69*  CREATININE 5.54*  --  5.50*  --  5.71* 5.75* 5.49*  CALCIUM 7.7*   < > 7.3*   < > 7.2* 7.3* 7.0*  PHOS  --   --   --   --   --   --  5.9*   < > = values in this interval not displayed.    Liver Function Tests: Recent Labs  Lab 01/15/20 1847  AST 15  ALT 15  ALKPHOS 79  BILITOT 0.9  PROT 6.4*  ALBUMIN 3.3*   No results for input(s): LIPASE,  AMYLASE in the last 168 hours. No results for input(s): AMMONIA in the last 168 hours.  CBC: Recent Labs  Lab 01/15/20 1847 01/16/20 0513 01/17/20 0723 01/18/20 0604 01/19/20 0707  WBC 7.7 7.8 7.7 6.5 6.9  NEUTROABS 7.0  --  6.7 5.8 6.0  HGB 9.3* 8.3* 8.4* 8.6* 8.7*  HCT 31.5* 27.7* 28.2* 28.0* 28.8*  MCV 87.3 86.6 87.3 83.6 85.5  PLT 244 203 185 189 187    Cardiac Enzymes: No results for input(s): CKTOTAL, CKMB, CKMBINDEX, TROPONINI in the last 168 hours.  BNP: Invalid input(s): POCBNP  CBG: Recent Labs  Lab 01/18/20 1208 01/18/20 1636 01/18/20 2110 01/19/20 0742 01/19/20 1224  GLUCAP 102* 103* 96 113* 117*    Microbiology: Results for orders placed or performed during the hospital encounter of 01/15/20  Respiratory Panel by RT PCR (Flu A&B, Covid) - Nasopharyngeal Swab     Status: None   Collection Time: 01/15/20  7:38 PM   Specimen: Nasopharyngeal Swab  Result Value Ref Range Status   SARS Coronavirus 2 by RT PCR NEGATIVE NEGATIVE Final    Comment: (NOTE) SARS-CoV-2 target nucleic acids  are NOT DETECTED. The SARS-CoV-2 RNA is generally detectable in upper respiratoy specimens during the acute phase of infection. The lowest concentration of SARS-CoV-2 viral copies this assay can detect is 131 copies/mL. A negative result does not preclude SARS-Cov-2 infection and should not be used as the sole basis for treatment or other patient management decisions. A negative result may occur with  improper specimen collection/handling, submission of specimen other than nasopharyngeal swab, presence of viral mutation(s) within the areas targeted by this assay, and inadequate number of viral copies (<131 copies/mL). A negative result must be combined with clinical observations, patient history, and epidemiological information. The expected result is Negative. Fact Sheet for Patients:  PinkCheek.be Fact Sheet for Healthcare Providers:   GravelBags.it This test is not yet ap proved or cleared by the [place] FDA and  has been authorized for detection and/or diagnosis of SARS-CoV-2 by FDA under an Emergency Use Authorization (EUA). This EUA will remain  in effect (meaning this test can be used) for the duration of the COVID-19 declaration under Section 564(b)(1) of the Act, 21 U.S.C. section 360bbb-3(b)(1), unless the authorization is terminated or revoked sooner.    Influenza A by PCR NEGATIVE NEGATIVE Final   Influenza B by PCR NEGATIVE NEGATIVE Final    Comment: (NOTE) The Xpert Xpress SARS-CoV-2/FLU/RSV assay is intended as an aid in  the diagnosis of influenza from Nasopharyngeal swab specimens and  should not be used as a sole basis for treatment. Nasal washings and  aspirates are unacceptable for Xpert Xpress SARS-CoV-2/FLU/RSV  testing. Fact Sheet for Patients: PinkCheek.be Fact Sheet for Healthcare Providers: GravelBags.it This test is not yet approved or cleared by the [place] FDA and  has been authorized for detection and/or diagnosis of SARS-CoV-2 by  FDA under an Emergency Use Authorization (EUA). This EUA will remain  in effect (meaning this test can be used) for the duration of the  Covid-19 declaration under Section 564(b)(1) of the Act, 21  U.S.C. section 360bbb-3(b)(1), unless the authorization is  terminated or revoked. Performed at [facility], [address] [place], [place] [address]   Blood Culture (routine x 2)     Status: None (Preliminary result)   Collection Time: 01/15/20  7:46 PM   Specimen: BLOOD RIGHT WRIST  Result Value Ref Range Status   Specimen Description BLOOD RIGHT WRIST  Final   Special Requests BOTTLES DRAWN AEROBIC AND ANAEROBIC  Final   Culture   Final    NO GROWTH 4 DAYS Performed at [facility], [address], [place], [place] [address]    Report  Status PENDING  Incomplete  Blood Culture (routine x 2)     Status: None (Preliminary result)   Collection Time: 01/16/20  5:26 AM   Specimen: Left Antecubital; Blood  Result Value Ref Range Status   Specimen Description LEFT ANTECUBITAL  Final   Special Requests   Final    BOTTLES DRAWN AEROBIC AND ANAEROBIC Blood Culture adequate volume   Culture   Final    NO GROWTH 3 DAYS Performed at [facility], [address], [place], [place] [address]    Report Status PENDING  Incomplete  MRSA PCR Screening     Status: None   Collection Time: 01/16/20 11:51 PM   Specimen: Nasopharyngeal  Result Value Ref Range Status   MRSA by PCR NEGATIVE NEGATIVE Final    Comment:        The GeneXpert MRSA Assay (FDA approved for NASAL specimens  only), is one component of a comprehensive MRSA colonization surveillance program. It is not intended to diagnose MRSA infection nor to guide or monitor treatment for MRSA infections. Performed at [facility], [address] [place],  [address]     Coagulation Studies: No results for input(s): LABPROT, INR in the last 72 hours.  Urinalysis: Recent Labs    01/18/20 1813  COLORURINE YELLOW*  LABSPEC 1.012  PHURINE 5.0  GLUCOSEU NEGATIVE  HGBUR SMALL*  BILIRUBINUR NEGATIVE  KETONESUR NEGATIVE  PROTEINUR 30*  NITRITE NEGATIVE  LEUKOCYTESUR MODERATE*      Imaging: CT CHEST WO CONTRAST  Result Date: 01/18/2020 CLINICAL DATA:  Shortness of breath EXAM: CT CHEST WITHOUT CONTRAST TECHNIQUE: Multidetector CT imaging of the chest was performed following the standard protocol without IV contrast. COMPARISON:  Chest radiograph dated 01/18/2020 FINDINGS: Cardiovascular: The main pulmonary artery is mildly enlarged, measuring 3.1 cm. Vascular calcifications are seen in the aortic arch. The heart is enlarged. No pericardial effusion. Mediastinum/Nodes: No enlarged mediastinal or axillary lymph nodes. Thyroid gland, trachea, and  esophagus demonstrate no significant findings. Lungs/Pleura: There are moderate bilateral pleural effusions with associated atelectasis. Bilateral parenchymal scarring is seen with superimposed atelectasis, ground-glass opacities, and consolidation. There is no pneumothorax. Upper Abdomen: No acute abnormality. Musculoskeletal: Age indeterminate anterior wedge deformities of T10 and T12 are progressed since 06/24/2015. There is focal kyphosis centered at T11-12. IMPRESSION: 1. Bilateral ground-glass opacities and consolidation may represent pneumonia. Moderate bilateral pleural effusions with associated atelectasis. 2. Cardiomegaly. Mildly enlarged main pulmonary artery suggestive of pulmonary hypertension. 3. Age indeterminate anterior wedge deformities of T10 and T12. Aortic Atherosclerosis (ICD10-I70.0). Electronically Signed   By: Zerita Boers M.D.   On: 01/18/2020 14:50   DG Chest Port 1 View  Result Date: 01/18/2020 CLINICAL DATA:  Shortness of breath and hypoxia. EXAM: PORTABLE CHEST 1 VIEW COMPARISON:  01/15/2020 FINDINGS: Stable cardiomegaly. Increased confluent opacity is seen in the left lower lung since prior exam. Left pleural effusion cannot be excluded. Diffuse interstitial infiltrates show no significant change. IMPRESSION: 1. Increased confluent opacity in left lower lung. Left pleural effusion cannot be excluded. 2. Stable cardiomegaly and diffuse interstitial infiltrates, suspicious for edema. Electronically Signed   By: Marlaine Hind M.D.   On: 01/18/2020 12:24     Medications:   . sodium chloride    . sodium chloride    . cefTRIAXone (ROCEPHIN)  IV Stopped (01/18/20 2224)   . atorvastatin  80 mg Oral Daily  . calcium-vitamin D  1 tablet Oral Daily  . Chlorhexidine Gluconate Cloth  6 each Topical Daily  . ciprofloxacin-dexamethasone  4 drop Left EAR BID  . cyanocobalamin  1,000 mcg Intramuscular Q30 days  . doxycycline  100 mg Oral Q12H  . ferrous sulfate  325 mg Oral Q lunch   . guaiFENesin  600 mg Oral BID  . heparin injection (subcutaneous)  5,000 Units Subcutaneous Q8H  . insulin aspart  0-9 Units Subcutaneous TID WC  . ipratropium-albuterol  3 mL Nebulization Q6H  . pantoprazole  40 mg Oral Daily  . sodium chloride HYPERTONIC  4 mL Nebulization Once   sodium chloride, sodium chloride, acetaminophen **OR** acetaminophen, albuterol, alteplase, heparin, lidocaine (PF), lidocaine-prilocaine, ondansetron **OR** ondansetron (ZOFRAN) IV, pentafluoroprop-tetrafluoroeth, sodium chloride, traZODone  Assessment/ Plan:  85 y.o. female with atrial fibrillation, congestive heart failure, COPD, diabetes mellitus, hypertension, obstructive sleep apnea, chronic kidney disease stage IIIb baseline EGFR 35 who was admitted with acute kidney injury, multifocal pneumonia.  1.  Acute kidney injury/chronic kidney disease stage IIIb baseline creatinine 1.4/EGFR 35.  Renal ultrasound negative for hydronephrosis but bilateral cysts noted. -Patient to have permacath placed today followed by first dialysis session.  We will plan for second dialysis session tomorrow.  2.  Metabolic acidosis.  Sodium bicarbonate drip stopped.  Serum bicarbonate now 24 and acceptable.  3.  Anemia of chronic kidney disease.  Hemoglobin 8.7 today.  Once she is started on regular schedule for dialysis we will plan to start the patient on Epogen.   LOS: 4 Munsoor Lateef 5/10/20212:22 PM

## 2020-01-19 NOTE — Progress Notes (Signed)
PROGRESS NOTE    Dorothy [name]  ZOX:[id number] DOB: [birth year] DOA: 01/15/2020 PCP: Letta Median, MD   Brief Narrative:  85 y.o. Caucasian female with a known history of CHF, COPD, type 2 diabetes mellitus, GERD, dyslipidemia, hypertension and obstructive sleep apnea, who presented to the emergency room with acute onset of generalized weakness with associated dyspnea and diminished p.o. intake over the last week.  She admitted to chills without measured fever as well as mild dyspnea and dry cough and occasional wheezing.  No chest pain or palpitations.  No nausea or vomiting or abdominal pain.  No dysuria, oliguria or hematuria or flank pain..  She has chronic respiratory failure on home O2 at 2 L/min.  Upon presentation to the emergency room, heart rate was 53 pulse extremity 91% on 2 L of O2 by nasal cannula and later 94-95% on 3.5 L of O2, with blood pressure initially normal and later 99/46 with otherwise normal vital signs.  BUN and creatinine were 67 and 5.16 compared to 43/1.4 on 10/10/19.  BNP was 256 and CBC showed anemia close to baseline.  Albumin was 3.3 and total protein 6.4.  Portable chest x-ray showed interval mild patchy atelectasis or pneumonia at the right lung base with mild increased patchy atelectasis or pneumonia at the left lung base and possible bilateral small pleural effusions as well as stable borderline cardiomegaly.  5/7: Patient seen and examined.  Persistent hypotension overnight necessitating multiple fluid boluses.  Also baseline bradycardia and atrial fibrillation noted with occasional decreases in heart rate down to the 20s and 30s.  Patient relatively asymptomatic.  Mentating clearly.  Reports feeling "a little funny" but unable to specify further.  No fevers noted.  Lactic acid normal.  Patient has been anuric since admission.  Creatinine markedly elevated.  Nephrology consult called.  5/8: Patient seen and examined.  Patient transferred to ICU overnight  given concerns for hypotension.  On my evaluation this morning patient has a widened pulse pressure though systolics are approximately 120.  Patient is mentating clearly.  Serial lactic acid all within normal limits.  No indication for pressor support.  Case discussed with intensivist Dr Patsey Berthold.  Will transfer patient back to cardiac progressive unit. patient remains anuric.  Kidney function worsening.  Case discussed with nephrology.  No emergent need for dialysis however kidney function continues to worsen can consider dialysis catheter placement for acute dialysis tomorrow.  Respiratory status actually improving.  Patient breathing a little easier.  Patient stable for progressive cardiac unit at this time.  Patient has a widened pulse pressure which drives down the MAP.  Patient systolic blood pressure remains above 90 so she should be stable for progressive floor.  If there are concerns for worsening hypotension and shock recommend stat lactic acid.  Thus far lactic acids have been within normal limits indicating adequate perfusion.  5/9: Patient seen and examined.  Respiratory status appears worse this morning.  Patient with a little increased work of breathing.  Oxygen saturation adequate on 4 4 to half liters nasal cannula.  Still no urine output.  Systolics remain soft.  Stable bradycardia.  Case discussed with nephrology.  Considering lack of improvement in renal function likely to start dialysis within the next 24 hours.  5/10: Patient seen and examined.  Respiratory status about stable from yesterday.  Remains on 4 L.  2 daughters are at bedside.  I explained that kidney function had not improved to the point that we could forego dialysis.  Vascular surgery has been consulted with plans for permacath placement.  Nephrology on consult as well.  Anticipate that patient will get dialysis catheter placement and start hemodialysis inpatient within the next 24 hours.  No fevers over interval.  No signs  of worsening infection.   Assessment & Plan:   Active Problems:   Multifocal pneumonia  Multifocal pneumonia. Patient was started on IV Rocephin and Zithromax on admission No fevers noted no worsening signs of infection Oxygen requirement worsened over interval Unclear with this is infectious in nature Procalcitonin only mildly elevated Plan: Continue IV Rocephin Continue IV azithromycin Sputum Gram stain culture: NGTD Urinary pneumonia antigens, unable to collect due to anuria Follow blood cultures, no growth to date Mucolytic's Scheduled duoneb Oxygen as needed, wean as tolerated  Acute on chronic hypoxic respiratory failure secondary to pneumonia See above for acute management Chest x-ray with worsening infiltrate Unclear whether this is fluid or atelectasis Chest CT: possible fluid with infiltrate, evidence of interstitial lung disease Discussed with nephrology Plan: No IVF Inpatient HD new start Encourage incentive spirometry Wean oxygen as tolerated  Sepsis secondary to pneumonia without severe sepsis or septic shock. Manifested by hypothermia, hypotension, hypoxia Despite relatively low maps the patient appears to be perfusing with normal lactic acid Management as above Sepsis symptoms have improved  Acute kidney injury superimposed on stage IIIb chronic kidney disease Metabolic acidosis Patient received several liters crystalloid and colloid boluses without significant change in kidney function Creatinine actually worsened over interval Renal ultrasound no hydrobilateral renal cyst noted Nephrology consult called, case discussed with Dr. Candiss Norse, recommendations appreciated Plan: Vascular and nephrology on consult Plan for inpatient HD today after Permacath placement  Atrial fibrillation Symptomatic bradycardia Chronic anticoagulation Patient on low-dose Eliquis No rate control agents appreciated on home med rec Patient has been relatively bradycardic  with baseline rate in the 50s with occasional dropped into the 30s Plan: Cardiology consultation.  Patient is established with Dr. Saralyn Pilar from [facility].  Dr. Nehemiah Massed on consult Avoid AV nodal blocking agents Clonidine discontinued Continue Eliquis Per cardiology no further diagnostics necessary at this time Continue telemetry  Dyslipidemia. Statin  Hypertension. All home antihypertensives on hold  Type 2 diabetes mellitus. Hold home Metformin Sliding scale insulin  GERD. PPI   DVT prophylaxis: Eliquis Code Status: Full Family Communication: Daughters at bedside 5/10 Disposition Plan: Status is: Inpatient  Remains inpatient appropriate because:Inpatient level of care appropriate due to severity of illness   Dispo: The patient is from: Home              Anticipated d/c is to: SNF              Anticipated d/c date is: 3 days              Patient currently is not medically stable to d/c.   Scheduled for permacath placement for new hemodialysis started today.  Continuing on IV antibiotics for multifocal pneumonia.  Formal disposition plan pending.   Consultants:   Nephrology- [facility]  Cardiology-[facility]  Procedures:   none  Antimicrobials:   Rocephin (01/15/20-  )  Azithromycin (01/15/20-  )   Subjective: Seen and examined Mentating clearly No clear improvement in symptoms  Objective: Vitals:   01/19/20 0400 01/19/20 0743 01/19/20 0802 01/19/20 1223  BP: 124/66 (!) 129/53  (!) 133/55  Pulse: 69 67  75  Resp:  17  19  Temp: 97.7 F (36.5 C) 98.8 F (37.1 C)  97.9 F (36.6 C)  TempSrc: Oral   Oral  SpO2: 95% 93% 91% (!) 88%  Weight: 88.1 kg     Height:        Intake/Output Summary (Last 24 hours) at 01/19/2020 1307 Last data filed at 01/19/2020 0900 Gross per 24 hour  Intake 170.37 ml  Output 1300 ml  Net -1129.63 ml   Filed Weights   01/16/20 2345 01/18/20 0334 01/19/20 0400  Weight: 86.6 kg 88.4 kg 88.1 kg     Examination:  General exam: Appears calm and comfortable, appears frail Respiratory system: Bilateral crackles.  Increased work of breathing Cardiovascular system: S1 & S2 heard, RRR. No JVD, murmurs, rubs, gallops or clicks. No pedal edema. Gastrointestinal system: Abdomen is nondistended, soft and nontender. No organomegaly or masses felt. Normal bowel sounds heard. Central nervous system: Alert and oriented. No focal neurological deficits. Extremities: Symmetric 5 x 5 power. Skin: No rashes, lesions or ulcers Psychiatry: Judgement and insight appear normal. Mood & affect appropriate.     Data Reviewed: I have personally reviewed following labs and imaging studies  CBC: Recent Labs  Lab 01/15/20 1847 01/16/20 0513 01/17/20 0723 01/18/20 0604 01/19/20 0707  WBC 7.7 7.8 7.7 6.5 6.9  NEUTROABS 7.0  --  6.7 5.8 6.0  HGB 9.3* 8.3* 8.4* 8.6* 8.7*  HCT 31.5* 27.7* 28.2* 28.0* 28.8*  MCV 87.3 86.6 87.3 83.6 85.5  PLT 244 203 185 189 563   Basic Metabolic Panel: Recent Labs  Lab 01/16/20 0513 01/16/20 2112 01/17/20 0723 01/18/20 0604 01/19/20 0707  NA 138 138 138 141 140  K 4.7 4.0 3.8 3.5 3.6  CL 111 109 107 106 103  CO2 16* 17* 20* 24 24  GLUCOSE 90 125* 127* 130* 118*  BUN 66* 65* 65* 67* 69*  CREATININE 5.54* 5.50* 5.71* 5.75* 5.49*  CALCIUM 7.7* 7.3* 7.2* 7.3* 7.0*  PHOS  --   --   --   --  5.9*   GFR: Estimated Creatinine Clearance: 8.5 mL/min (A) (by C-G formula based on SCr of 5.49 mg/dL (H)). Liver Function Tests: Recent Labs  Lab 01/15/20 1847  AST 15  ALT 15  ALKPHOS 79  BILITOT 0.9  PROT 6.4*  ALBUMIN 3.3*   No results for input(s): LIPASE, AMYLASE in the last 168 hours. No results for input(s): AMMONIA in the last 168 hours. Coagulation Profile: No results for input(s): INR, PROTIME in the last 168 hours. Cardiac Enzymes: No results for input(s): CKTOTAL, CKMB, CKMBINDEX, TROPONINI in the last 168 hours. BNP (last 3 results) No results  for input(s): PROBNP in the last 8760 hours. HbA1C: No results for input(s): HGBA1C in the last 72 hours. CBG: Recent Labs  Lab 01/18/20 1208 01/18/20 1636 01/18/20 2110 01/19/20 0742 01/19/20 1224  GLUCAP 102* 103* 96 113* 117*   Lipid Profile: No results for input(s): CHOL, HDL, LDLCALC, TRIG, CHOLHDL, LDLDIRECT in the last 72 hours. Thyroid Function Tests: Recent Labs    01/16/20 2130 01/17/20 0704  TSH 2.417  --   FREET4  --  1.15*   Anemia Panel: No results for input(s): VITAMINB12, FOLATE, FERRITIN, TIBC, IRON, RETICCTPCT in the last 72 hours. Sepsis Labs: Recent Labs  Lab 01/15/20 1946 01/15/20 2045 01/16/20 0805 01/16/20 1121 01/17/20 0703 01/17/20 0946 01/18/20 0604  PROCALCITON 0.17  --   --   --  0.18  --  0.17  LATICACIDVEN  --    < > 0.7 0.7 0.8 0.8  --    < > = values in this  interval not displayed.    Recent Results (from the past 240 hour(s))  Respiratory Panel by RT PCR (Flu A&B, Covid) - Nasopharyngeal Swab     Status: None   Collection Time: 01/15/20  7:38 PM   Specimen: Nasopharyngeal Swab  Result Value Ref Range Status   SARS Coronavirus 2 by RT PCR NEGATIVE NEGATIVE Final    Comment: (NOTE) SARS-CoV-2 target nucleic acids are NOT DETECTED. The SARS-CoV-2 RNA is generally detectable in upper respiratoy specimens during the acute phase of infection. The lowest concentration of SARS-CoV-2 viral copies this assay can detect is 131 copies/mL. A negative result does not preclude SARS-Cov-2 infection and should not be used as the sole basis for treatment or other patient management decisions. A negative result may occur with  improper specimen collection/handling, submission of specimen other than nasopharyngeal swab, presence of viral mutation(s) within the areas targeted by this assay, and inadequate number of viral copies (<131 copies/mL). A negative result must be combined with clinical observations, patient history, and epidemiological  information. The expected result is Negative. Fact Sheet for Patients:  PinkCheek.be Fact Sheet for Healthcare Providers:  GravelBags.it This test is not yet ap proved or cleared by the [place] FDA and  has been authorized for detection and/or diagnosis of SARS-CoV-2 by FDA under an Emergency Use Authorization (EUA). This EUA will remain  in effect (meaning this test can be used) for the duration of the COVID-19 declaration under Section 564(b)(1) of the Act, 21 U.S.C. section 360bbb-3(b)(1), unless the authorization is terminated or revoked sooner.    Influenza A by PCR NEGATIVE NEGATIVE Final   Influenza B by PCR NEGATIVE NEGATIVE Final    Comment: (NOTE) The Xpert Xpress SARS-CoV-2/FLU/RSV assay is intended as an aid in  the diagnosis of influenza from Nasopharyngeal swab specimens and  should not be used as a sole basis for treatment. Nasal washings and  aspirates are unacceptable for Xpert Xpress SARS-CoV-2/FLU/RSV  testing. Fact Sheet for Patients: PinkCheek.be Fact Sheet for Healthcare Providers: GravelBags.it This test is not yet approved or cleared by the [place] FDA and  has been authorized for detection and/or diagnosis of SARS-CoV-2 by  FDA under an Emergency Use Authorization (EUA). This EUA will remain  in effect (meaning this test can be used) for the duration of the  Covid-19 declaration under Section 564(b)(1) of the Act, 21  U.S.C. section 360bbb-3(b)(1), unless the authorization is  terminated or revoked. Performed at [facility], [address] [place], [place] [address]   Blood Culture (routine x 2)     Status: None (Preliminary result)   Collection Time: 01/15/20  7:46 PM   Specimen: BLOOD RIGHT WRIST  Result Value Ref Range Status   Specimen Description BLOOD RIGHT WRIST  Final   Special Requests BOTTLES DRAWN AEROBIC  AND ANAEROBIC  Final   Culture   Final    NO GROWTH 4 DAYS Performed at [facility], [address], [place], [place] [address]    Report Status PENDING  Incomplete  Blood Culture (routine x 2)     Status: None (Preliminary result)   Collection Time: 01/16/20  5:26 AM   Specimen: Left Antecubital; Blood  Result Value Ref Range Status   Specimen Description LEFT ANTECUBITAL  Final   Special Requests   Final    BOTTLES DRAWN AEROBIC AND ANAEROBIC Blood Culture adequate volume   Culture   Final    NO GROWTH 3 DAYS Performed at [facility],  [place], [place] [address]    Report Status PENDING  Incomplete  MRSA PCR Screening     Status: None   Collection Time: 01/16/20 11:51 PM   Specimen: Nasopharyngeal  Result Value Ref Range Status   MRSA by PCR NEGATIVE NEGATIVE Final    Comment:        The GeneXpert MRSA Assay (FDA approved for NASAL specimens only), is one component of a comprehensive MRSA colonization surveillance program. It is not intended to diagnose MRSA infection nor to guide or monitor treatment for MRSA infections. Performed at [facility], [address], [place], [place] [address]          Radiology Studies: CT CHEST WO CONTRAST  Result Date: 01/18/2020 CLINICAL DATA:  Shortness of breath EXAM: CT CHEST WITHOUT CONTRAST TECHNIQUE: Multidetector CT imaging of the chest was performed following the standard protocol without IV contrast. COMPARISON:  Chest radiograph dated 01/18/2020 FINDINGS: Cardiovascular: The main pulmonary artery is mildly enlarged, measuring 3.1 cm. Vascular calcifications are seen in the aortic arch. The heart is enlarged. No pericardial effusion. Mediastinum/Nodes: No enlarged mediastinal or axillary lymph nodes. Thyroid gland, trachea, and esophagus demonstrate no significant findings. Lungs/Pleura: There are moderate bilateral pleural effusions with associated atelectasis. Bilateral  parenchymal scarring is seen with superimposed atelectasis, ground-glass opacities, and consolidation. There is no pneumothorax. Upper Abdomen: No acute abnormality. Musculoskeletal: Age indeterminate anterior wedge deformities of T10 and T12 are progressed since 06/24/2015. There is focal kyphosis centered at T11-12. IMPRESSION: 1. Bilateral ground-glass opacities and consolidation may represent pneumonia. Moderate bilateral pleural effusions with associated atelectasis. 2. Cardiomegaly. Mildly enlarged main pulmonary artery suggestive of pulmonary hypertension. 3. Age indeterminate anterior wedge deformities of T10 and T12. Aortic Atherosclerosis (ICD10-I70.0). Electronically Signed   By: Zerita Boers M.D.   On: 01/18/2020 14:50   DG Chest Port 1 View  Result Date: 01/18/2020 CLINICAL DATA:  Shortness of breath and hypoxia. EXAM: PORTABLE CHEST 1 VIEW COMPARISON:  01/15/2020 FINDINGS: Stable cardiomegaly. Increased confluent opacity is seen in the left lower lung since prior exam. Left pleural effusion cannot be excluded. Diffuse interstitial infiltrates show no significant change. IMPRESSION: 1. Increased confluent opacity in left lower lung. Left pleural effusion cannot be excluded. 2. Stable cardiomegaly and diffuse interstitial infiltrates, suspicious for edema. Electronically Signed   By: Marlaine Hind M.D.   On: 01/18/2020 12:24        Scheduled Meds: . atorvastatin  80 mg Oral Daily  . calcium-vitamin D  1 tablet Oral Daily  . Chlorhexidine Gluconate Cloth  6 each Topical Daily  . ciprofloxacin-dexamethasone  4 drop Left EAR BID  . cyanocobalamin  1,000 mcg Intramuscular Q30 days  . doxycycline  100 mg Oral Q12H  . ferrous sulfate  325 mg Oral Q lunch  . guaiFENesin  600 mg Oral BID  . heparin injection (subcutaneous)  5,000 Units Subcutaneous Q8H  . insulin aspart  0-9 Units Subcutaneous TID WC  . ipratropium-albuterol  3 mL Nebulization Q6H  . pantoprazole  40 mg Oral Daily    Continuous Infusions: . sodium chloride    . sodium chloride    . cefTRIAXone (ROCEPHIN)  IV Stopped (01/18/20 2224)     LOS: 4 days    Time spent: 35 minutes    Sidney Ace, MD [facility] Pager [phone]  If 7PM-7AM, please contact night-coverage 01/19/2020, 1:07 PM

## 2020-01-19 NOTE — OR Nursing (Signed)
Dr Lucky Cowboy reviewing pt assessment since she is very short of breath and sats on arrival to specials 86% on 4 literes. Lung full of congestion. Third space swelling throughout extremites. Increased oxygen to 6 liters via [place], oxygen saturations 91%.

## 2020-01-19 NOTE — Progress Notes (Signed)
   01/19/20 2200  Hand-Off documentation  Handoff Given Given to shift RN/LPN  Report given to (Full Name) premary RN  Handoff Received Received from shift RN/LPN  Report received from (Full Name) Sherren Mocha  Vital Signs  Temp (!) 97.5 F (36.4 C)  Temp Source Oral  Pulse Rate 66  Pulse Rate Source Monitor  Resp 18  BP 119/66  BP Location Right Arm  BP Method Automatic  Patient Position (if appropriate) Lying  Oxygen Therapy  SpO2 95 %  O2 Device Nasal Cannula  O2 Flow Rate (L/min) 6 L/min  Pain Assessment  Pain Scale 0-10  Pain Score 0  During Hemodialysis Assessment  Blood Flow Rate (mL/min) 150 mL/min  Arterial Pressure (mmHg) -60 mmHg  Venous Pressure (mmHg) 70 mmHg  Transmembrane Pressure (mmHg) 40 mmHg  Ultrafiltration Rate (mL/min) 0 mL/min  Dialysate Flow Rate (mL/min) 30 ml/min  Conductivity: Machine  13.9  HD Safety Checks Performed Yes  KECN 13.1 KECN  Dialysis Fluid Bolus Normal Saline  Bolus Amount (mL) 250 mL  Intra-Hemodialysis Comments Tolerated well;Tx completed  Post-Hemodialysis Assessment  Rinseback Volume (mL) 250 mL  KECN 13.1 V  Dialyzer Clearance Lightly streaked  Duration of HD Treatment -hour(s) 1.5 hour(s)  Hemodialysis Intake (mL) 500 mL  UF Total -Machine (mL) 500 mL  Net UF (mL) 0 mL  Tolerated HD Treatment Yes  Post-Hemodialysis Comments 1st treatment tolerated well  Education / Care Plan  Dialysis Education Provided Yes  Note  Observations no c/o   Hemodialysis Catheter Right Internal jugular Double lumen Permanent (Tunneled)  Placement Date/Time: 01/19/20 1904   Placed prior to admission: Yes  Time Out: Correct patient;Correct site;Correct procedure  Maximum sterile barrier precautions: Hand hygiene;Cap;Mask;Sterile gown;Sterile gloves;Large sterile sheet  Site Prep: Chlor...  Site Condition No complications  Blue Lumen Status Flushed;Heparin locked  Red Lumen Status Flushed;Heparin locked  Purple Lumen Status N/A  Catheter  fill solution Heparin 1000 units/ml  Catheter fill volume (Arterial) 2 cc  Catheter fill volume (Venous) 2.2  Dressing Status Clean;Dry;Intact  Drainage Description None  Post treatment catheter status Capped and Clamped  1st treatment no fluid removal per order, tolerated well nos c/o or s/s of distress

## 2020-01-19 NOTE — Progress Notes (Signed)
Updated daughter Alisia Ferrari Apple about patient's current condition after returning from dialysis. Patient is stable without any current complaints.

## 2020-01-19 NOTE — Consult Note (Signed)
[facility] Vascular Consult Note  MRN : [id number]  Dorothy [name] is a 85 y.o. ([birth year]) female who presents with chief complaint of  Chief Complaint  Patient presents with  . Weakness   History of Present Illness:  The patient is a an 85 year old female with multiple medical issues (see below) who presented to the [facility] emergency department with a chief complaint of progressively worsening weakness.  Patient endorses a history of progressively worsening weakness the last few days.  Notes increasing shortness of breath.  Patient notes that she is on chronic O2.  Patient denies any fever, nausea vomiting.  Denies any abdominal pain however notes a decrease in appetite.  Bilateral lower extremity edema.   BUN and creatinine were 67 and 5.16 compared to 43/1.4 on 10/10/19.   Vascular surgery was consulted by Dr. Holley Raring for insertion of a PermCath.  Current Facility-Administered Medications  Medication Dose Route Frequency Provider Last Rate Last Admin  . 0.9 %  sodium chloride infusion  100 mL Intravenous PRN Lateef, Munsoor, MD      . 0.9 %  sodium chloride infusion  100 mL Intravenous PRN Lateef, Munsoor, MD      . acetaminophen (TYLENOL) tablet 650 mg  650 mg Oral Q6H PRN [name], Jan A, MD   650 mg at 01/18/20 1115   Or  . acetaminophen (TYLENOL) suppository 650 mg  650 mg Rectal Q6H PRN [name], Jan A, MD      . albuterol (PROVENTIL) (2.5 MG/3ML) 0.083% nebulizer solution 2.5 mg  2.5 mg Inhalation Q6H PRN [name], Jan A, MD   2.5 mg at 01/18/20 1120  . alteplase (CATHFLO ACTIVASE) injection 2 mg  2 mg Intracatheter Once PRN Lateef, Munsoor, MD      . atorvastatin (LIPITOR) tablet 80 mg  80 mg Oral Daily [name], Jan A, MD   80 mg at 01/18/20 1826  . calcium-vitamin D (OSCAL WITH D) 500-200 MG-UNIT per tablet 1 tablet  1 tablet Oral Daily [name], Jan A, MD   1 tablet at 01/19/20 1023  . cefTRIAXone (ROCEPHIN) 2 g in sodium chloride 0.9  % 100 mL IVPB  2 g Intravenous Q24H [name], Arvella Merles, MD   Stopped at 01/18/20 2224  . Chlorhexidine Gluconate Cloth 2 % PADS 6 each  6 each Topical Daily Sharion Settler, NP   6 each at 01/18/20 1000  . ciprofloxacin-dexamethasone (CIPRODEX) 0.3-0.1 % OTIC (EAR) suspension 4 drop  4 drop Left EAR BID Ralene Muskrat B, MD   4 drop at 01/19/20 0853  . cyanocobalamin ((VITAMIN B-12)) injection 1,000 mcg  1,000 mcg Intramuscular Q30 days [name], Jan A, MD   1,000 mcg at 01/19/20 1023  . doxycycline (VIBRA-TABS) tablet 100 mg  100 mg Oral Q12H Barefoot, Jody C, RPH   100 mg at 01/19/20 1024  . ferrous sulfate tablet 325 mg  325 mg Oral Q lunch Barefoot, Jody C, RPH   325 mg at 01/19/20 1022  . guaiFENesin (MUCINEX) 12 hr tablet 600 mg  600 mg Oral BID [name], Jan A, MD   600 mg at 01/19/20 1022  . heparin injection 1,000 Units  1,000 Units Dialysis PRN Lateef, Munsoor, MD      . heparin injection 5,000 Units  5,000 Units Subcutaneous Q8H Tyler Pita, MD   5,000 Units at 01/18/20 2138  . insulin aspart (novoLOG) injection 0-9 Units  0-9 Units Subcutaneous TID WC Sidney Ace, MD      .  ipratropium-albuterol (DUONEB) 0.5-2.5 (3) MG/3ML nebulizer solution 3 mL  3 mL Nebulization Q6H Sreenath, Sudheer B, MD   3 mL at 01/19/20 0802  . lidocaine (PF) (XYLOCAINE) 1 % injection 5 mL  5 mL Intradermal PRN Lateef, Munsoor, MD      . lidocaine-prilocaine (EMLA) cream 1 application  1 application Topical PRN Lateef, Munsoor, MD      . ondansetron (ZOFRAN) tablet 4 mg  4 mg Oral Q6H PRN [name], Jan A, MD       Or  . ondansetron [facility] injection 4 mg  4 mg Intravenous Q6H PRN [name], Jan A, MD   4 mg at 01/19/20 0809  . pantoprazole (PROTONIX) EC tablet 40 mg  40 mg Oral Daily [name], Jan A, MD   40 mg at 01/19/20 1022  . pentafluoroprop-tetrafluoroeth (GEBAUERS) aerosol 1 application  1 application Topical PRN Lateef, Munsoor, MD      . sodium chloride (OCEAN) 0.65 % nasal spray 1 spray  1 spray Each  Nare PRN Sreenath, Sudheer B, MD      . traZODone (DESYREL) tablet 25 mg  25 mg Oral QHS PRN [name], Arvella Merles, MD   25 mg at 01/18/20 2150   Past Medical History:  Diagnosis Date  . Anemia   . Arthritis    hands, knees, back  . Atrial fibrillation ([place])   . CHF (congestive heart failure) ([place])   . COPD (chronic obstructive pulmonary disease) ([place])   . Diabetes mellitus without complication ([place])   . Dyspnea   . GERD (gastroesophageal reflux disease)   . Hyperlipidemia   . Hypertension   . PONV (postoperative nausea and vomiting)   . Sleep apnea    told she needs CPAP, does not have  . Wears dentures    full upper and lower.  only wears upper   Past Surgical History:  Procedure Laterality Date  . ABDOMINAL HYSTERECTOMY    . CATARACT EXTRACTION W/PHACO Right 07/12/2016   Procedure: CATARACT EXTRACTION PHACO AND INTRAOCULAR LENS PLACEMENT (IOC);  Surgeon: Leandrew Koyanagi, MD;  Location: [place];  Service: Ophthalmology;  Laterality: Right;  DIABETIC - insulin and oral meds sleep apnea  . CATARACT EXTRACTION W/PHACO Left 04/09/2019   Procedure: CATARACT EXTRACTION PHACO AND INTRAOCULAR LENS PLACEMENT ([place])  LEFT DIABETIC;  Surgeon: Leandrew Koyanagi, MD;  Location: [place];  Service: Ophthalmology;  Laterality: Left;  Diabetic - insulin and oral meds  . CHOLECYSTECTOMY    . COLONOSCOPY N/A 01/19/2015   Procedure: COLONOSCOPY;  Surgeon: Josefine Class, MD;  Location: [facility] ENDOSCOPY;  Service: Endoscopy;  Laterality: N/A;  . COLONOSCOPY WITH PROPOFOL N/A 07/11/2017   Procedure: COLONOSCOPY WITH PROPOFOL;  Surgeon: Toledo, Benay Pike, MD;  Location: [facility] ENDOSCOPY;  Service: Gastroenterology;  Laterality: N/A;  . ESOPHAGOGASTRODUODENOSCOPY N/A 01/19/2015   Procedure: ESOPHAGOGASTRODUODENOSCOPY (EGD);  Surgeon: Josefine Class, MD;  Location: [facility] ENDOSCOPY;  Service: Endoscopy;  Laterality: N/A;  . ESOPHAGOGASTRODUODENOSCOPY (EGD) WITH PROPOFOL N/A  07/11/2017   Procedure: ESOPHAGOGASTRODUODENOSCOPY (EGD) WITH PROPOFOL;  Surgeon: Toledo, Benay Pike, MD;  Location: [facility] ENDOSCOPY;  Service: Gastroenterology;  Laterality: N/A;  . SHOULDER ARTHROSCOPY     Social History Social History   Tobacco Use  . Smoking status: Never Smoker  . Smokeless tobacco: Never Used  Substance Use Topics  . Alcohol use: No  . Drug use: Not on file   Family History Denies family history of peripheral artery disease, venous disease or renal disease.  Allergies  Allergen Reactions  .  Tramadol Anaphylaxis  . Aspirin     "Stomach problems"   REVIEW OF SYSTEMS (Negative unless checked)  Constitutional: [] Weight loss  [] Fever  [] Chills Cardiac: [] Chest pain   [] Chest pressure   [] Palpitations   [x] Shortness of breath when laying flat   [x] Shortness of breath at rest   [x] Shortness of breath with exertion. Vascular:  [] Pain in legs with walking   [] Pain in legs at rest   [] Pain in legs when laying flat   [] Claudication   [] Pain in feet when walking  [] Pain in feet at rest  [] Pain in feet when laying flat   [] History of DVT   [] Phlebitis   [x] Swelling in legs   [] Varicose veins   [] Non-healing ulcers Pulmonary:   [] Uses home oxygen   [] Productive cough   [] Hemoptysis   [] Wheeze  [] COPD   [] Asthma Neurologic:  [] Dizziness  [] Blackouts   [] Seizures   [] History of stroke   [] History of TIA  [] Aphasia   [] Temporary blindness   [] Dysphagia   [] Weakness or numbness in arms   [] Weakness or numbness in legs Musculoskeletal:  [] Arthritis   [] Joint swelling   [] Joint pain   [] Low back pain Hematologic:  [] Easy bruising  [] Easy bleeding   [] Hypercoagulable state   [] Anemic  [] Hepatitis Gastrointestinal:  [] Blood in stool   [] Vomiting blood  [] Gastroesophageal reflux/heartburn   [] Difficulty swallowing. Genitourinary:  [x] Chronic kidney disease   [] Difficult urination  [] Frequent urination  [] Burning with urination   [] Blood in urine Skin:  [] Rashes   [] Ulcers    [] Wounds Psychological:  [] History of anxiety   []  History of major depression.  Physical Examination  Vitals:   01/19/20 0204 01/19/20 0400 01/19/20 0743 01/19/20 0802  BP:  124/66 (!) 129/53   Pulse:  69 67   Resp:   17   Temp:  97.7 F (36.5 C) 98.8 F (37.1 C)   TempSrc:  Oral    SpO2: 97% 95% 93% 91%  Weight:  88.1 kg    Height:       Body mass index is 33.35 kg/m. Gen:  WD/WN, NAD Head: [place]/AT, No temporalis wasting. Prominent temp pulse not noted. Ear/Nose/Throat: Hearing grossly intact, nares w/o erythema or drainage, oropharynx w/o Erythema/Exudate Eyes: Sclera non-icteric, conjunctiva clear Neck: Trachea midline.  No JVD.  Pulmonary:  Good air movement, respirations not labored, decreased bilaterally.  Cardiac: RRR, normal S1, S2. Vascular:  Vessel Right Left  Radial Palpable Palpable  Ulnar Palpable Palpable  Brachial Palpable Palpable                           Gastrointestinal: soft, non-tender/non-distended. No guarding/reflex.  Musculoskeletal: M/S 5/5 throughout.  Extremities without ischemic changes.  No deformity or atrophy. No edema. Neurologic: Sensation grossly intact in extremities.  Symmetrical.  Speech is fluent. Motor exam as listed above. Psychiatric: Judgment intact, Mood & affect appropriate for pt's clinical situation. Dermatologic: No rashes or ulcers noted.  No cellulitis or open wounds. Lymph : No Cervical, Axillary, or Inguinal lymphadenopathy.  CBC Lab Results  Component Value Date   WBC 6.9 01/19/2020   HGB 8.7 (L) 01/19/2020   HCT 28.8 (L) 01/19/2020   MCV 85.5 01/19/2020   PLT 187 01/19/2020   BMET    Component Value Date/Time   NA 140 01/19/2020 0707   NA 141 01/09/2015 0542   K 3.6 01/19/2020 0707   K 4.3 01/09/2015 0542   CL 103 01/19/2020 0707  CL 104 01/09/2015 0542   CO2 24 01/19/2020 0707   CO2 30 01/09/2015 0542   GLUCOSE 118 (H) 01/19/2020 0707   GLUCOSE 142 (H) 01/09/2015 0542   BUN 69 (H) 01/19/2020  0707   BUN 17 01/09/2015 0542   CREATININE 5.49 (H) 01/19/2020 0707   CREATININE 1.33 (H) 01/09/2015 0542   CALCIUM 7.0 (L) 01/19/2020 0707   CALCIUM 9.2 01/09/2015 0542   GFRNONAA 7 (L) 01/19/2020 0707   GFRNONAA 38 (L) 01/09/2015 0542   GFRAA 8 (L) 01/19/2020 0707   GFRAA 45 (L) 01/09/2015 0542   Estimated Creatinine Clearance: 8.5 mL/min (A) (by C-G formula based on SCr of 5.49 mg/dL (H)).  COAG No results found for: INR, PROTIME  Radiology CT CHEST WO CONTRAST  Result Date: 01/18/2020 CLINICAL DATA:  Shortness of breath EXAM: CT CHEST WITHOUT CONTRAST TECHNIQUE: Multidetector CT imaging of the chest was performed following the standard protocol without IV contrast. COMPARISON:  Chest radiograph dated 01/18/2020 FINDINGS: Cardiovascular: The main pulmonary artery is mildly enlarged, measuring 3.1 cm. Vascular calcifications are seen in the aortic arch. The heart is enlarged. No pericardial effusion. Mediastinum/Nodes: No enlarged mediastinal or axillary lymph nodes. Thyroid gland, trachea, and esophagus demonstrate no significant findings. Lungs/Pleura: There are moderate bilateral pleural effusions with associated atelectasis. Bilateral parenchymal scarring is seen with superimposed atelectasis, ground-glass opacities, and consolidation. There is no pneumothorax. Upper Abdomen: No acute abnormality. Musculoskeletal: Age indeterminate anterior wedge deformities of T10 and T12 are progressed since 06/24/2015. There is focal kyphosis centered at T11-12. IMPRESSION: 1. Bilateral ground-glass opacities and consolidation may represent pneumonia. Moderate bilateral pleural effusions with associated atelectasis. 2. Cardiomegaly. Mildly enlarged main pulmonary artery suggestive of pulmonary hypertension. 3. Age indeterminate anterior wedge deformities of T10 and T12. Aortic Atherosclerosis (ICD10-I70.0). Electronically Signed   By: Zerita Boers M.D.   On: 01/18/2020 14:50   US RENAL  Result Date:  01/16/2020 CLINICAL DATA:  84 year old female with a history of acute kidney injury EXAM: RENAL / URINARY TRACT ULTRASOUND COMPLETE COMPARISON:  04/03/2008 FINDINGS: Right Kidney: Length: 11.3 cm x 5.6 cm x 4.8 cm, 157 cc. No hydronephrosis. Flow confirmed in the hilum. Cystic structure with through transmission of the right upper pole cortex measures 6.1 cm x 3.0 cm x 3.4 cm. Left Kidney: Length: 12.6 cm x 5.0 cm x 5.9 cm, 193 cc. No hydronephrosis. Flow confirmed in the hilum. Cystic structure in the superior left kidney with through transmission and no internal complexity or flow measures 5.2 cm x 5.0 cm by 4.8 cm. Bladder: Appears normal for degree of bladder distention. IMPRESSION: Negative for hydronephrosis of the bilateral kidneys. Right-sided Bosniak 2 cyst and a left-sided Bosniak 1 cyst. Electronically Signed   By: Corrie Mckusick D.O.   On: 01/16/2020 09:21   DG Chest Port 1 View  Result Date: 01/18/2020 CLINICAL DATA:  Shortness of breath and hypoxia. EXAM: PORTABLE CHEST 1 VIEW COMPARISON:  01/15/2020 FINDINGS: Stable cardiomegaly. Increased confluent opacity is seen in the left lower lung since prior exam. Left pleural effusion cannot be excluded. Diffuse interstitial infiltrates show no significant change. IMPRESSION: 1. Increased confluent opacity in left lower lung. Left pleural effusion cannot be excluded. 2. Stable cardiomegaly and diffuse interstitial infiltrates, suspicious for edema. Electronically Signed   By: Marlaine Hind M.D.   On: 01/18/2020 12:24   DG Chest Portable 1 View  Result Date: 01/15/2020 CLINICAL DATA:  Shortness of breath and weakness. EXAM: PORTABLE CHEST 1 VIEW COMPARISON:  10/06/2019 FINDINGS: The  cardiac silhouette remains borderline enlarged. Linear opacities are again demonstrated in the mid and lower lung zones on the left, resolved in the upper lung zones. Interval mild patchy density at the right lung base. Mildly increased patchy density at the left lung base with  possible small bilateral pleural effusions. Diffuse osteopenia. IMPRESSION: 1. Interval mild patchy atelectasis or pneumonia at the right lung base. 2. Mildly increased patchy atelectasis or pneumonia at the left lung base. 3. Possible small bilateral pleural effusions. 4. Stable borderline cardiomegaly. Electronically Signed   By: Claudie Revering M.D.   On: 01/15/2020 19:12   ECHOCARDIOGRAM COMPLETE  Result Date: 01/16/2020    ECHOCARDIOGRAM REPORT   Patient Name:   Dorothy [name] Date of Exam: 01/16/2020 Medical Rec #:  [id number]       Height:       66.0 in Accession #:    [id number]      Weight:       193.3 lb Date of Birth:  1935-07-24       BSA:          1.971 m Patient Age:    54 years        BP:           112/23 mmHg Patient Gender: F               HR:           54 bpm. Exam Location:  [facility] Procedure: 2D Echo, Color Doppler and Cardiac Doppler Indications:     I50.31 CHF-Acute Diastolic  History:         Patient has prior history of Echocardiogram examinations. COPD;                  Risk Factors:Sleep Apnea, Hypertension, Diabetes and                  Dyslipidemia.  Sonographer:     Charmayne Sheer RDCS (AE) Referring Phys:  [id number] Sidney Ace Diagnosing Phys: Serafina Royals MD  Sonographer Comments: No subcostal window and suboptimal apical window. Image acquisition challenging due to patient body habitus and Image acquisition challenging due to COPD. IMPRESSIONS  1. Left ventricular ejection fraction, by estimation, is 55 to 60%. The left ventricle has normal function. The left ventricle has no regional wall motion abnormalities. Left ventricular diastolic function could not be evaluated.  2. Right ventricular systolic function is normal. The right ventricular size is normal. There is mildly elevated pulmonary artery systolic pressure.  3. Left atrial size was mildly dilated.  4. Right atrial size was mildly dilated.  5. The mitral valve is normal in structure. Mild mitral valve regurgitation.  6. The  aortic valve is normal in structure. Aortic valve regurgitation is trivial. FINDINGS  Left Ventricle: Left ventricular ejection fraction, by estimation, is 55 to 60%. The left ventricle has normal function. The left ventricle has no regional wall motion abnormalities. The left ventricular internal cavity size was normal in size. There is  no left ventricular hypertrophy. Left ventricular diastolic function could not be evaluated. Right Ventricle: The right ventricular size is normal. No increase in right ventricular wall thickness. Right ventricular systolic function is normal. There is mildly elevated pulmonary artery systolic pressure. The tricuspid regurgitant velocity is 2.81  m/s, and with an assumed right atrial pressure of 10 mmHg, the estimated right ventricular systolic pressure is 43.3 mmHg. Left Atrium: Left atrial size was mildly dilated. Right Atrium: Right atrial size was  mildly dilated. Pericardium: There is no evidence of pericardial effusion. Mitral Valve: The mitral valve is normal in structure. Mild mitral valve regurgitation. MV peak gradient, 13.7 mmHg. The mean mitral valve gradient is 3.0 mmHg. Tricuspid Valve: The tricuspid valve is normal in structure. Tricuspid valve regurgitation is mild. Aortic Valve: The aortic valve is normal in structure. Aortic valve regurgitation is trivial. Aortic valve mean gradient measures 7.0 mmHg. Aortic valve peak gradient measures 17.3 mmHg. Aortic valve area, by VTI measures 2.07 cm. Pulmonic Valve: The pulmonic valve was normal in structure. Pulmonic valve regurgitation is not visualized. Aorta: The aortic root and ascending aorta are structurally normal, with no evidence of dilitation. IAS/Shunts: No atrial level shunt detected by color flow Doppler.  LEFT VENTRICLE PLAX 2D LVIDd:         4.47 cm  Diastology LVIDs:         2.53 cm  LV e' lateral:   6.74 cm/s LV PW:         1.04 cm  LV E/e' lateral: 25.4 LV IVS:        0.90 cm  LV e' medial:    5.22 cm/s  LVOT diam:     1.90 cm  LV E/e' medial:  32.9 LV SV:         92 LV SV Index:   47 LVOT Area:     2.84 cm  RIGHT VENTRICLE RV Basal diam:  2.85 cm LEFT ATRIUM             Index       RIGHT ATRIUM           Index LA diam:        5.30 cm 2.69 cm/m  RA Area:     15.30 cm LA Vol (A2C):   62.9 ml 31.91 ml/m RA Volume:   37.30 ml  18.92 ml/m LA Vol (A4C):   64.9 ml 32.92 ml/m LA Biplane Vol: 64.9 ml 32.92 ml/m  AORTIC VALVE                    PULMONIC VALVE AV Area (Vmax):    2.18 cm     PV Vmax:       1.42 m/s AV Area (Vmean):   2.08 cm     PV Vmean:      90.400 cm/s AV Area (VTI):     2.07 cm     PV VTI:        0.317 m AV Vmax:           208.00 cm/s  PV Peak grad:  8.1 mmHg AV Vmean:          125.000 cm/s PV Mean grad:  4.0 mmHg AV VTI:            0.445 m AV Peak Grad:      17.3 mmHg AV Mean Grad:      7.0 mmHg LVOT Vmax:         160.00 cm/s LVOT Vmean:        91.500 cm/s LVOT VTI:          0.325 m LVOT/AV VTI ratio: 0.73  AORTA Ao Root diam: 2.90 cm MITRAL VALVE                TRICUSPID VALVE MV Area (PHT): 2.31 cm     TR Peak grad:   31.6 mmHg MV Peak grad:  13.7 mmHg    TR Vmax:  281.00 cm/s MV Mean grad:  3.0 mmHg MV Vmax:       1.85 m/s     SHUNTS MV Vmean:      62.1 cm/s    Systemic VTI:  0.32 m MV Decel Time: 329 msec     Systemic Diam: 1.90 cm MV E velocity: 171.50 cm/s Serafina Royals MD Electronically signed by Serafina Royals MD Signature Date/Time: 01/16/2020/3:52:32 PM    Final    Assessment/Plan The patient is a an 85 year old female with multiple medical issues (see below) who presented to the [facility] emergency department with a chief complaint of progressively worsening weakness.  1.  Acute on chronic kidney disease: Patient presents with acute on chronic kidney disease which has not shown any improvement while inpatient.  At this time, nephrology would like to initiate hemodialysis however the patient does not have an adequate dialysis access at this time.   Recommend placing a PermCath to the outpatient dialysis inpatient patient setting.  Procedure, risks and benefits were explained to the patient and her 2 daughters who are at the bedside.  All questions answered.  The patient and her daughter wish to proceed.  2.  Diabetes: On appropriate medications.Encouraged good control as its slows the progression of atherosclerotic disease  3.  Anemia of chronic disease: Currently asymptomatic. This is followed by nephrology and the patient's primary care physician.  Discussed with Dr. Mayme Genta, PA-C  01/19/2020 12:14 PM  This note was created with Dragon medical transcription system.  Any error is purely unintentional

## 2020-01-19 NOTE — Care Management Important Message (Signed)
Important Message  Patient Details  Name: Dorothy [name] MRN: [id number] Date of Birth: 1935-02-06   Medicare Important Message Given:  Yes     Dannette Barbara 01/19/2020, 12:25 PM

## 2020-01-19 NOTE — Consult Note (Signed)
Consultation Note Date: 01/19/2020   Patient Name: Dorothy [name]  DOB: 1934-09-23  MRN: [id number]  Age / Sex: 85 y.o., female   PCP: Letta Median, MD Referring Physician: Sidney Ace, MD   REASON FOR CONSULTATION:Establishing goals of care  Palliative Care consult requested for goals of care discussion in this 85 y.o. female with multiple medical problems including CHF, COPD (2L home oxygen), type 2 diabetes mellitus, GERD, dyslipidemia, hypertension and obstructive sleep apnea. She presented to ED with complaints of generalized weakness, dyspnea, and decreased oral intake for approximately a week prior. During ED work-up found to have heart rate in low 50s, 94-95% on 3.5L. BUN and creatinine were 67 and 5.16 compared to 43/1.4 on 10/10/19.  BNP was 256. Chest x-ray showed interval mild patchy atelectasis or pneumonia at the right lung base with mild increased patchy atelectasis or pneumonia at the left lung base and possible bilateral small pleural effusions as well as stable borderline cardiomegaly. She was initiated on IV antibiotics. She is being followed by Cardiology due to continued bradycardia. Followed by Nephrology due to worsening renal failure, now anuric. With plans to initiate dialysis and [facility] Cath placement.   Clinical Assessment and Goals of Care: I have reviewed medical records including lab results, imaging, Epic notes, and MAR, received report from the bedside RN, and assessed the patient. I met at the bedside with patient and her 2 daughters Dorothy [name] & Dorothy [name]), and her granddaughter, Dorothy [name] to discuss diagnosis prognosis, [place], EOL wishes, disposition and options.  I introduced Palliative Medicine as specialized medical care for people living with serious illness. It focuses on providing relief from the symptoms and stress of a serious illness. The goal is to improve quality of life for both the patient and the family. Patient and family verbalized  understanding and appreciation of our involvement.   Mrs. Delage is awake, A&O x3. She was able to engage in discussion appropriately. She reports nausea and gives permission for daughters to be included in discussion while she rest.   We discussed a brief life review of the patient, along with her functional and nutritional status. Patient's husband passed away several years ago under hospice care. She has the 2 daughters (one lives in [place] and the other lives in [place]). 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 She lives alone with her granddaughters support.   Prior to admission she was requiring additional assistance with ADLs due to increased fatigue and shortness of breath. Patient reports she has not eaten much for about a week prior to admission due to intermittent nausea. She was only able to do yogurt, ginger ale, and crackers. She reports increase in leg swelling and fatigue. Prior to her current illness she reports she was able to care for herself independently. Daughter reports patient is not a complainer and would often not ask for assistance. She recently stopped driving due to weakness.   We discussed Her current illness and what it means in the larger context of Her on-going co-morbidities. Natural disease trajectory and expectations at EOL were discussed.  Patient and family verbalized understanding of patient's current illness and co-morbidities. Mrs. Demario reports she is hopeful for some improvement/stability with current medical interventions.   We discussed her bradycardia and renal failure in detail. Patient and family verbalized understanding.  I attempted to elicit values and goals of care important to the patient.    The difference between aggressive medical intervention  and comfort care was considered in light of the patient's goals of care. Mrs. Weldin and her family expressed goals of continued medical interventions such as dialysis  and any other recommended treatments. She again is hopeful for some improvement/stability. Daughters report patient's quality of life was good prior to current illness and their goal is to allow her every opportunity to improve if possible. Patient states goal of starting HD as recommending to see if she shows improvement and able to tolerate. She reports if she is not able to tolerate or shows no meaningful improvement she would then wish for care to transition more to a comfort focus. Expressing her quality of life means the most. Daughters verbalized understanding and agreement.   Patient does not have a documented advanced directive. She reports her daughters together would make the best decisions for her if needed. She and family confirms DNR/DNI. Family again expressed plans for trial HD to allow an opportunity for improvement/stability and plans to further evaluate wishes regarding continuing HD long-term. They are hopeful this will not be the case and/or she will tolerate with awareness of cardiology co-morbidities, and also prepared for the worst (no improvement/unable to tolerate/further health decline).   We discussed in detail best case and worst case scenario. Family was open to conversation and appreciative of discussion. They share if patient does not tolerate they will be open to hospice. We discussed in detail if hospice/comfort becomes of focus of care what and where that would take place. Patient is hopeful for SNF with rehab in best case scenario. In worst case she and family expressed in the setting of comfort care they would wish for her to be at residential hospice home. Support given.   Hospice and Palliative Care services outpatient were explained and offered. Patient and family verbalized their understanding and awareness of both palliative and hospice's goals and philosophy of care. Given patient and family's expressed wishes for continued aggressive interventions, outpatient  palliative was recommended. Family and patient agreed, with understanding she may transition to hospice at anytime.   Questions and concerns were addressed.  Hard Choices booklet left for review. The family was encouraged to call with questions or concerns.  PMT will continue to support holistically.   SOCIAL HISTORY:     reports that she has never smoked. She has never used smokeless tobacco. She reports that she does not drink alcohol.  CODE STATUS: DNR  ADVANCE DIRECTIVES: Patient/Daughters    SYMPTOM MANAGEMENT: Per attending   Palliative Prophylaxis:   Bowel Regimen, Frequent Pain Assessment and Oral Care  PSYCHO-SOCIAL/SPIRITUAL:  Support System: Family  Desire for further Chaplaincy support:No   Additional Recommendations (Limitations, Scope, Preferences):  DNR, treat the treatable    PAST MEDICAL HISTORY: Past Medical History:  Diagnosis Date  . Anemia   . Arthritis    hands, knees, back  . Atrial fibrillation ([place])   . CHF (congestive heart failure) ([place])   . COPD (chronic obstructive pulmonary disease) ([place])   . Diabetes mellitus without complication ([place])   . Dyspnea   . GERD (gastroesophageal reflux disease)   . Hyperlipidemia   . Hypertension   . PONV (postoperative nausea and vomiting)   . Sleep apnea    told she needs CPAP, does not have  . Wears dentures    full upper and lower.  only wears upper    PAST SURGICAL HISTORY:  Past Surgical History:  Procedure Laterality Date  . ABDOMINAL HYSTERECTOMY    . CATARACT EXTRACTION  W/PHACO Right 07/12/2016   Procedure: CATARACT EXTRACTION PHACO AND INTRAOCULAR LENS PLACEMENT (IOC);  Surgeon: Leandrew Koyanagi, MD;  Location: [place];  Service: Ophthalmology;  Laterality: Right;  DIABETIC - insulin and oral meds sleep apnea  . CATARACT EXTRACTION W/PHACO Left 04/09/2019   Procedure: CATARACT EXTRACTION PHACO AND INTRAOCULAR LENS PLACEMENT ([place])  LEFT DIABETIC;  Surgeon: Leandrew Koyanagi,  MD;  Location: [place];  Service: Ophthalmology;  Laterality: Left;  Diabetic - insulin and oral meds  . CHOLECYSTECTOMY    . COLONOSCOPY N/A 01/19/2015   Procedure: COLONOSCOPY;  Surgeon: Josefine Class, MD;  Location: [facility] ENDOSCOPY;  Service: Endoscopy;  Laterality: N/A;  . COLONOSCOPY WITH PROPOFOL N/A 07/11/2017   Procedure: COLONOSCOPY WITH PROPOFOL;  Surgeon: Toledo, Benay Pike, MD;  Location: [facility] ENDOSCOPY;  Service: Gastroenterology;  Laterality: N/A;  . ESOPHAGOGASTRODUODENOSCOPY N/A 01/19/2015   Procedure: ESOPHAGOGASTRODUODENOSCOPY (EGD);  Surgeon: Josefine Class, MD;  Location: [facility] ENDOSCOPY;  Service: Endoscopy;  Laterality: N/A;  . ESOPHAGOGASTRODUODENOSCOPY (EGD) WITH PROPOFOL N/A 07/11/2017   Procedure: ESOPHAGOGASTRODUODENOSCOPY (EGD) WITH PROPOFOL;  Surgeon: Toledo, Benay Pike, MD;  Location: [facility] ENDOSCOPY;  Service: Gastroenterology;  Laterality: N/A;  . SHOULDER ARTHROSCOPY      ALLERGIES:  is allergic to tramadol and aspirin.   MEDICATIONS:  Current Facility-Administered Medications  Medication Dose Route Frequency Provider Last Rate Last Admin  . 0.9 %  sodium chloride infusion  100 mL Intravenous PRN Lateef, Munsoor, MD      . 0.9 %  sodium chloride infusion  100 mL Intravenous PRN Lateef, Munsoor, MD      . acetaminophen (TYLENOL) tablet 650 mg  650 mg Oral Q6H PRN [name], Jan A, MD   650 mg at 01/18/20 1115   Or  . acetaminophen (TYLENOL) suppository 650 mg  650 mg Rectal Q6H PRN [name], Jan A, MD      . albuterol (PROVENTIL) (2.5 MG/3ML) 0.083% nebulizer solution 2.5 mg  2.5 mg Inhalation Q6H PRN [name], Jan A, MD   2.5 mg at 01/18/20 1120  . alteplase (CATHFLO ACTIVASE) injection 2 mg  2 mg Intracatheter Once PRN Lateef, Munsoor, MD      . atorvastatin (LIPITOR) tablet 80 mg  80 mg Oral Daily [name], Jan A, MD   80 mg at 01/18/20 1826  . calcium-vitamin D (OSCAL WITH D) 500-200 MG-UNIT per tablet 1 tablet  1 tablet Oral Daily [name], Jan A, MD    1 tablet at 01/19/20 1023  . cefTRIAXone (ROCEPHIN) 2 g in sodium chloride 0.9 % 100 mL IVPB  2 g Intravenous Q24H [name], Arvella Merles, MD   Stopped at 01/18/20 2224  . Chlorhexidine Gluconate Cloth 2 % PADS 6 each  6 each Topical Daily Sharion Settler, NP   6 each at 01/18/20 1000  . ciprofloxacin-dexamethasone (CIPRODEX) 0.3-0.1 % OTIC (EAR) suspension 4 drop  4 drop Left EAR BID Ralene Muskrat B, MD   4 drop at 01/19/20 0853  . cyanocobalamin ((VITAMIN B-12)) injection 1,000 mcg  1,000 mcg Intramuscular Q30 days [name], Jan A, MD   1,000 mcg at 01/19/20 1023  . doxycycline (VIBRA-TABS) tablet 100 mg  100 mg Oral Q12H Barefoot, Jody C, RPH   100 mg at 01/19/20 1024  . fentaNYL (SUBLIMAZE) 100 MCG/2ML injection           . ferrous sulfate tablet 325 mg  325 mg Oral Q lunch Barefoot, Jody C, RPH   325 mg at 01/19/20 1022  . guaiFENesin (MUCINEX) 12 hr  tablet 600 mg  600 mg Oral BID [name], Jan A, MD   600 mg at 01/19/20 1022  . heparin 10000 UNIT/ML injection           . heparin injection 1,000 Units  1,000 Units Dialysis PRN Lateef, Munsoor, MD      . heparin injection 5,000 Units  5,000 Units Subcutaneous Q8H Tyler Pita, MD   5,000 Units at 01/18/20 2138  . insulin aspart (novoLOG) injection 0-9 Units  0-9 Units Subcutaneous TID WC Sreenath, Sudheer B, MD      . ipratropium-albuterol (DUONEB) 0.5-2.5 (3) MG/3ML nebulizer solution 3 mL  3 mL Nebulization Q6H Sreenath, Sudheer B, MD   3 mL at 01/19/20 1459  . lidocaine (PF) (XYLOCAINE) 1 % injection 5 mL  5 mL Intradermal PRN Lateef, Munsoor, MD      . lidocaine-prilocaine (EMLA) cream 1 application  1 application Topical PRN Lateef, Munsoor, MD      . midazolam (VERSED) 5 MG/5ML injection           . ondansetron (ZOFRAN) tablet 4 mg  4 mg Oral Q6H PRN [name], Jan A, MD       Or  . ondansetron [facility]) injection 4 mg  4 mg Intravenous Q6H PRN [name], Jan A, MD   4 mg at 01/19/20 0809  . ondansetron (ZOFRAN) injection 4 mg  4 mg Intravenous Q6H  PRN Stegmayer, Kimberly A, PA-C      . pantoprazole (PROTONIX) EC tablet 40 mg  40 mg Oral Daily [name], Jan A, MD   40 mg at 01/19/20 1022  . pentafluoroprop-tetrafluoroeth (GEBAUERS) aerosol 1 application  1 application Topical PRN Lateef, Munsoor, MD      . sodium chloride (OCEAN) 0.65 % nasal spray 1 spray  1 spray Each Nare PRN Priscella Mann, Sudheer B, MD      . traZODone (DESYREL) tablet 25 mg  25 mg Oral QHS PRN [name], Jan A, MD   25 mg at 01/18/20 2150    VITAL SIGNS: BP 114/70 (BP Location: Right Arm)   Pulse 74   Temp 97.9 F (36.6 C) (Oral)   Resp (!) 22   Ht 5' 4" (1.626 m)   Wt 88.1 kg   SpO2 98%   BMI 33.35 kg/m  Filed Weights   01/16/20 2345 01/18/20 0334 01/19/20 0400  Weight: 86.6 kg 88.4 kg 88.1 kg    Estimated body mass index is 33.35 kg/m as calculated from the following:   Height as of this encounter: 5' 4" (1.626 m).   Weight as of this encounter: 88.1 kg.  LABS: CBC:    Component Value Date/Time   WBC 6.9 01/19/2020 0707   HGB 8.7 (L) 01/19/2020 0707   HGB 8.4 (L) 01/09/2015 0542   HCT 28.8 (L) 01/19/2020 0707   HCT 26.8 (L) 01/09/2015 0542   PLT 187 01/19/2020 0707   PLT 205 01/09/2015 0542   Comprehensive Metabolic Panel:    Component Value Date/Time   NA 140 01/19/2020 0707   NA 141 01/09/2015 0542   K 3.6 01/19/2020 0707   K 4.3 01/09/2015 0542   CO2 24 01/19/2020 0707   CO2 30 01/09/2015 0542   BUN 69 (H) 01/19/2020 0707   BUN 17 01/09/2015 0542   CREATININE 5.49 (H) 01/19/2020 0707   CREATININE 1.33 (H) 01/09/2015 0542   ALBUMIN 3.3 (L) 01/15/2020 1847   ALBUMIN 3.9 12/31/2014 1248     Review of Systems  Respiratory: Positive for shortness of  breath.   Gastrointestinal: Positive for nausea.  Neurological: Positive for weakness.  Unless otherwise noted, a complete review of systems is negative.  Physical Exam General: NAD, well developed Cardiovascular: irregular (a-fib) Pulmonary: clear ant fields Abdomen: soft, nontender, +  bowel sounds Extremities: bilateral lower extremity edema Neurological: Alert and oriented, mood appropriate    Prognosis: Guarded   Discharge Planning:  To Be Determined with outpatient Palliative   Recommendations:  DNR/DNI-as confirmed by patient/daughters  Continue with current plan of care per medical team  Patient and family remains hopeful for improvement/stability with HD. Requesting timed trial and close follow-up and recommendations from Nephrology regarding patient's tolerance. If not tolerating would not wish to continue long-term and would be open to transitioning care to a more comfort/hospice approach.   Outpatient palliative support at SNF w/rehab.   PMT will continue to support and follow as needed.    Palliative Performance Scale:PPS 30%              Patient and family expressed understanding and was in agreement with this plan.   Thank you for allowing the Palliative Medicine Team to assist in the care of this patient.  Time In: 1030 Time Out: 1145 Time Total: 75 min.   Visit consisted of counseling and education dealing with the complex and emotionally intense issues of symptom management and palliative care in the setting of serious and potentially life-threatening illness.Greater than 50%  of this time was spent counseling and coordinating care related to the above assessment and plan.  Signed by:  Alda Lea, AGPCNP-BC Palliative Medicine Team  Phone: [phone] Fax: [phone] Pager: [phone] Amion: Bjorn Pippin

## 2020-01-19 NOTE — Op Note (Signed)
OPERATIVE NOTE    PRE-OPERATIVE DIAGNOSIS: 1. ESRD   POST-OPERATIVE DIAGNOSIS: same as above  PROCEDURE: 1. Ultrasound guidance for vascular access to the right internal jugular vein 2. Fluoroscopic guidance for placement of catheter 3. Placement of a 19 cm tip to cuff tunneled hemodialysis catheter via the right internal jugular vein  SURGEON: Leotis Pain, MD  ANESTHESIA:  Local with Moderate conscious sedation for approximately 15 minutes using 1 mg of Versed and 25 mcg of Fentanyl  ESTIMATED BLOOD LOSS: 5 cc  FLUORO TIME: less than one minute  CONTRAST: none  FINDING(S): 1.  Patent right internal jugular vein  SPECIMEN(S):  None  INDICATIONS:   Dorothy [name] is a 85 y.o.female who presents with renal failure.  The patient needs long term dialysis access for their ESRD, and a Permcath is necessary.  Risks and benefits are discussed and informed consent is obtained.    DESCRIPTION: After obtaining full informed written consent, the patient was brought back to the vascular suited. The patient's right neck and chest were sterilely prepped and draped in a sterile surgical field was created. Moderate conscious sedation was administered during a face to face encounter with the patient throughout the procedure with my supervision of the RN administering medicines and monitoring the patient's vital signs, pulse oximetry, telemetry and mental status throughout from the start of the procedure until the patient was taken to the recovery room.  The right internal jugular vein was visualized with ultrasound and found to be patent. It was then accessed under direct ultrasound guidance and a permanent image was recorded. A wire was placed. After skin nick and dilatation, the peel-away sheath was placed over the wire. I then turned my attention to an area under the clavicle. Approximately 1-2 fingerbreadths below the clavicle a small counterincision was created and tunneled from the subclavicular  incision to the access site. Using fluoroscopic guidance, a 19 centimeter tip to cuff tunneled hemodialysis catheter was selected, and tunneled from the subclavicular incision to the access site. It was then placed through the peel-away sheath and the peel-away sheath was removed. Using fluoroscopic guidance the catheter tips were parked in the right atrium. The appropriate distal connectors were placed. It withdrew blood well and flushed easily with heparinized saline and a concentrated heparin solution was then placed. It was secured to the chest wall with 2 Prolene sutures. The access incision was closed single 4-0 Monocryl. A 4-0 Monocryl pursestring suture was placed around the exit site. Sterile dressings were placed. The patient tolerated the procedure well and was taken to the recovery room in stable condition.  COMPLICATIONS: None  CONDITION: Stable  Leotis Pain, MD 01/19/2020 7:16 PM   This note was created with Dragon Medical transcription system. Any errors in dictation are purely unintentional.

## 2020-01-19 NOTE — H&P (Signed)
[facility] History & Physical Update  The patient was interviewed and re-examined.  The patient's previous History and Physical has been reviewed and is unchanged.  There is no change in the plan of care. We plan to proceed with the scheduled procedure.  Leotis Pain, MD  01/19/2020, 5:40 PM

## 2020-01-19 NOTE — Progress Notes (Signed)
[facility] Encounter Note  Patient: Dorothy [name] / Admit Date: 01/15/2020 / Date of Encounter: 01/19/2020, 1:25 PM   Subjective: Patient feels relatively well this morning conversing without any difficulty breathing.  Patient has had significant hypotension which appears to have resolved blood pressure more stable today.  Patient has had a discontinuation of amlodipine and clonidine due to this issue.  Heart rate appears to be relatively stable at   60 bpm without evidence of significant long RR intervals or need for pacemaker placement at this time.  Patient's breathing is better after treatment of pulmonary infection.  Patient has had significant acute kidney injury without urine output and therefore may need dialysis as she continues to have difficulty Echo overall normal with ef 60% Review of Systems: Positive for: Shortness of breath Negative for: Vision change, hearing change, syncope, dizziness, nausea, vomiting,diarrhea, bloody stool, stomach pain, cough, congestion, diaphoresis, urinary frequency, urinary pain,skin lesions, skin rashes Others previously listed  Objective: Telemetry: Atrial fibrillation with slow ventricular rate Physical Exam: Blood pressure (!) 133/55, pulse 75, temperature 97.9 F (36.6 C), temperature source Oral, resp. rate 19, height 5\' 4"  (1.626 m), weight 88.1 kg, SpO2 (!) 88 %. Body mass index is 33.35 kg/m. General: Well developed, well nourished, in no acute distress. Head: Normocephalic, atraumatic, sclera non-icteric, no xanthomas, nares are without discharge. Neck: No apparent masses Lungs: Normal respirations with some wheezes, some rhonchi, no rales , few basilar crackles   Heart: Irregular rate and rhythm, normal S1 S2, no murmur, no rub, no gallop, PMI is normal size and placement, carotid upstroke normal without bruit, jugular venous pressure normal Abdomen: Soft, non-tender, non-distended with normoactive bowel sounds. No  hepatosplenomegaly. Abdominal aorta is normal size without bruit Extremities: 1+ edema, no clubbing, no cyanosis, no ulcers,  Peripheral: 2+ radial, 2+ femoral, 2+ dorsal pedal pulses Neuro: Alert and oriented. Moves all extremities spontaneously. Psych:  Responds to questions appropriately with a normal affect.   Intake/Output Summary (Last 24 hours) at 01/19/2020 1325 Last data filed at 01/19/2020 0900 Gross per 24 hour  Intake 170.37 ml  Output 1300 ml  Net -1129.63 ml    Inpatient Medications:  . atorvastatin  80 mg Oral Daily  . calcium-vitamin D  1 tablet Oral Daily  . Chlorhexidine Gluconate Cloth  6 each Topical Daily  . ciprofloxacin-dexamethasone  4 drop Left EAR BID  . cyanocobalamin  1,000 mcg Intramuscular Q30 days  . doxycycline  100 mg Oral Q12H  . ferrous sulfate  325 mg Oral Q lunch  . guaiFENesin  600 mg Oral BID  . heparin injection (subcutaneous)  5,000 Units Subcutaneous Q8H  . insulin aspart  0-9 Units Subcutaneous TID WC  . ipratropium-albuterol  3 mL Nebulization Q6H  . pantoprazole  40 mg Oral Daily  . sodium chloride HYPERTONIC  4 mL Nebulization Once   Infusions:  . sodium chloride    . sodium chloride    . cefTRIAXone (ROCEPHIN)  IV Stopped (01/18/20 2224)    Labs: Recent Labs    01/18/20 0604 01/19/20 0707  NA 141 140  K 3.5 3.6  CL 106 103  CO2 24 24  GLUCOSE 130* 118*  BUN 67* 69*  CREATININE 5.75* 5.49*  CALCIUM 7.3* 7.0*  PHOS  --  5.9*   No results for input(s): AST, ALT, ALKPHOS, BILITOT, PROT, ALBUMIN in the last 72 hours. Recent Labs    01/18/20 0604 01/19/20 0707  WBC 6.5 6.9  NEUTROABS 5.8 6.0  HGB 8.6* 8.7*  HCT 28.0* 28.8*  MCV 83.6 85.5  PLT 189 187   No results for input(s): CKTOTAL, CKMB, TROPONINI in the last 72 hours. Invalid input(s): POCBNP No results for input(s): HGBA1C in the last 72 hours.   Weights: Filed Weights   01/16/20 2345 01/18/20 0334 01/19/20 0400  Weight: 86.6 kg 88.4 kg 88.1 kg      Radiology/Studies:  CT CHEST WO CONTRAST  Result Date: 01/18/2020 CLINICAL DATA:  Shortness of breath EXAM: CT CHEST WITHOUT CONTRAST TECHNIQUE: Multidetector CT imaging of the chest was performed following the standard protocol without IV contrast. COMPARISON:  Chest radiograph dated 01/18/2020 FINDINGS: Cardiovascular: The main pulmonary artery is mildly enlarged, measuring 3.1 cm. Vascular calcifications are seen in the aortic arch. The heart is enlarged. No pericardial effusion. Mediastinum/Nodes: No enlarged mediastinal or axillary lymph nodes. Thyroid gland, trachea, and esophagus demonstrate no significant findings. Lungs/Pleura: There are moderate bilateral pleural effusions with associated atelectasis. Bilateral parenchymal scarring is seen with superimposed atelectasis, ground-glass opacities, and consolidation. There is no pneumothorax. Upper Abdomen: No acute abnormality. Musculoskeletal: Age indeterminate anterior wedge deformities of T10 and T12 are progressed since 06/24/2015. There is focal kyphosis centered at T11-12. IMPRESSION: 1. Bilateral ground-glass opacities and consolidation may represent pneumonia. Moderate bilateral pleural effusions with associated atelectasis. 2. Cardiomegaly. Mildly enlarged main pulmonary artery suggestive of pulmonary hypertension. 3. Age indeterminate anterior wedge deformities of T10 and T12. Aortic Atherosclerosis (ICD10-I70.0). Electronically Signed   By: Zerita Boers M.D.   On: 01/18/2020 14:50   US RENAL  Result Date: 01/16/2020 CLINICAL DATA:  85 year old female with a history of acute kidney injury EXAM: RENAL / URINARY TRACT ULTRASOUND COMPLETE COMPARISON:  04/03/2008 FINDINGS: Right Kidney: Length: 11.3 cm x 5.6 cm x 4.8 cm, 157 cc. No hydronephrosis. Flow confirmed in the hilum. Cystic structure with through transmission of the right upper pole cortex measures 6.1 cm x 3.0 cm x 3.4 cm. Left Kidney: Length: 12.6 cm x 5.0 cm x 5.9 cm, 193 cc. No  hydronephrosis. Flow confirmed in the hilum. Cystic structure in the superior left kidney with through transmission and no internal complexity or flow measures 5.2 cm x 5.0 cm by 4.8 cm. Bladder: Appears normal for degree of bladder distention. IMPRESSION: Negative for hydronephrosis of the bilateral kidneys. Right-sided Bosniak 2 cyst and a left-sided Bosniak 1 cyst. Electronically Signed   By: Corrie Mckusick D.O.   On: 01/16/2020 09:21   DG Chest Port 1 View  Result Date: 01/18/2020 CLINICAL DATA:  Shortness of breath and hypoxia. EXAM: PORTABLE CHEST 1 VIEW COMPARISON:  01/15/2020 FINDINGS: Stable cardiomegaly. Increased confluent opacity is seen in the left lower lung since prior exam. Left pleural effusion cannot be excluded. Diffuse interstitial infiltrates show no significant change. IMPRESSION: 1. Increased confluent opacity in left lower lung. Left pleural effusion cannot be excluded. 2. Stable cardiomegaly and diffuse interstitial infiltrates, suspicious for edema. Electronically Signed   By: Marlaine Hind M.D.   On: 01/18/2020 12:24   DG Chest Portable 1 View  Result Date: 01/15/2020 CLINICAL DATA:  Shortness of breath and weakness. EXAM: PORTABLE CHEST 1 VIEW COMPARISON:  10/06/2019 FINDINGS: The cardiac silhouette remains borderline enlarged. Linear opacities are again demonstrated in the mid and lower lung zones on the left, resolved in the upper lung zones. Interval mild patchy density at the right lung base. Mildly increased patchy density at the left lung base with possible small bilateral pleural effusions. Diffuse osteopenia. IMPRESSION: 1. Interval mild patchy  atelectasis or pneumonia at the right lung base. 2. Mildly increased patchy atelectasis or pneumonia at the left lung base. 3. Possible small bilateral pleural effusions. 4. Stable borderline cardiomegaly. Electronically Signed   By: Claudie Revering M.D.   On: 01/15/2020 19:12   ECHOCARDIOGRAM COMPLETE  Result Date: 01/16/2020     ECHOCARDIOGRAM REPORT   Patient Name:   Dorothy [name] Date of Exam: 01/16/2020 Medical Rec #:  [id number]       Height:       66.0 in Accession #:    [id number]      Weight:       193.3 lb Date of Birth:  03/13/1935       BSA:          1.971 m Patient Age:    9 years        BP:           112/23 mmHg Patient Gender: F               HR:           54 bpm. Exam Location:  [facility] Procedure: 2D Echo, Color Doppler and Cardiac Doppler Indications:     I50.31 CHF-Acute Diastolic  History:         Patient has prior history of Echocardiogram examinations. COPD;                  Risk Factors:Sleep Apnea, Hypertension, Diabetes and                  Dyslipidemia.  Sonographer:     Charmayne Sheer RDCS (AE) Referring Phys:  [id number] Sidney Ace Diagnosing Phys: Serafina Royals MD  Sonographer Comments: No subcostal window and suboptimal apical window. Image acquisition challenging due to patient body habitus and Image acquisition challenging due to COPD. IMPRESSIONS  1. Left ventricular ejection fraction, by estimation, is 55 to 60%. The left ventricle has normal function. The left ventricle has no regional wall motion abnormalities. Left ventricular diastolic function could not be evaluated.  2. Right ventricular systolic function is normal. The right ventricular size is normal. There is mildly elevated pulmonary artery systolic pressure.  3. Left atrial size was mildly dilated.  4. Right atrial size was mildly dilated.  5. The mitral valve is normal in structure. Mild mitral valve regurgitation.  6. The aortic valve is normal in structure. Aortic valve regurgitation is trivial. FINDINGS  Left Ventricle: Left ventricular ejection fraction, by estimation, is 55 to 60%. The left ventricle has normal function. The left ventricle has no regional wall motion abnormalities. The left ventricular internal cavity size was normal in size. There is  no left ventricular hypertrophy. Left ventricular diastolic function could not be evaluated.  Right Ventricle: The right ventricular size is normal. No increase in right ventricular wall thickness. Right ventricular systolic function is normal. There is mildly elevated pulmonary artery systolic pressure. The tricuspid regurgitant velocity is 2.81  m/s, and with an assumed right atrial pressure of 10 mmHg, the estimated right ventricular systolic pressure is 03.8 mmHg. Left Atrium: Left atrial size was mildly dilated. Right Atrium: Right atrial size was mildly dilated. Pericardium: There is no evidence of pericardial effusion. Mitral Valve: The mitral valve is normal in structure. Mild mitral valve regurgitation. MV peak gradient, 13.7 mmHg. The mean mitral valve gradient is 3.0 mmHg. Tricuspid Valve: The tricuspid valve is normal in structure. Tricuspid valve regurgitation is mild. Aortic Valve: The aortic valve is normal  in structure. Aortic valve regurgitation is trivial. Aortic valve mean gradient measures 7.0 mmHg. Aortic valve peak gradient measures 17.3 mmHg. Aortic valve area, by VTI measures 2.07 cm. Pulmonic Valve: The pulmonic valve was normal in structure. Pulmonic valve regurgitation is not visualized. Aorta: The aortic root and ascending aorta are structurally normal, with no evidence of dilitation. IAS/Shunts: No atrial level shunt detected by color flow Doppler.  LEFT VENTRICLE PLAX 2D LVIDd:         4.47 cm  Diastology LVIDs:         2.53 cm  LV e' lateral:   6.74 cm/s LV PW:         1.04 cm  LV E/e' lateral: 25.4 LV IVS:        0.90 cm  LV e' medial:    5.22 cm/s LVOT diam:     1.90 cm  LV E/e' medial:  32.9 LV SV:         92 LV SV Index:   47 LVOT Area:     2.84 cm  RIGHT VENTRICLE RV Basal diam:  2.85 cm LEFT ATRIUM             Index       RIGHT ATRIUM           Index LA diam:        5.30 cm 2.69 cm/m  RA Area:     15.30 cm LA Vol (A2C):   62.9 ml 31.91 ml/m RA Volume:   37.30 ml  18.92 ml/m LA Vol (A4C):   64.9 ml 32.92 ml/m LA Biplane Vol: 64.9 ml 32.92 ml/m  AORTIC VALVE                     PULMONIC VALVE AV Area (Vmax):    2.18 cm     PV Vmax:       1.42 m/s AV Area (Vmean):   2.08 cm     PV Vmean:      90.400 cm/s AV Area (VTI):     2.07 cm     PV VTI:        0.317 m AV Vmax:           208.00 cm/s  PV Peak grad:  8.1 mmHg AV Vmean:          125.000 cm/s PV Mean grad:  4.0 mmHg AV VTI:            0.445 m AV Peak Grad:      17.3 mmHg AV Mean Grad:      7.0 mmHg LVOT Vmax:         160.00 cm/s LVOT Vmean:        91.500 cm/s LVOT VTI:          0.325 m LVOT/AV VTI ratio: 0.73  AORTA Ao Root diam: 2.90 cm MITRAL VALVE                TRICUSPID VALVE MV Area (PHT): 2.31 cm     TR Peak grad:   31.6 mmHg MV Peak grad:  13.7 mmHg    TR Vmax:        281.00 cm/s MV Mean grad:  3.0 mmHg MV Vmax:       1.85 m/s     SHUNTS MV Vmean:      62.1 cm/s    Systemic VTI:  0.32 m MV Decel Time: 329 msec     Systemic Diam: 1.90 cm  MV E velocity: 171.50 cm/s Serafina Royals MD Electronically signed by Serafina Royals MD Signature Date/Time: 01/16/2020/3:52:32 PM    Final      Assessment and Recommendation  85 y.o. female with chronic nonvalvular atrial fibrillation with slow ventricular rate chronic kidney disease stage V anemia with acute respiratory failure likely due to infection pneumonia slightly improved with oxygenation with need for possible renal replacement 1.  No medication management for blood pressure due to hypotension and concerns 2.  Continue anticoagulation for further risk reduction in stroke with atrial fibrillation 3.  Watch telemetry for any significant long RR intervals or need for further intervention in the future with slow ventricular rate of atrial fibrillation which appeared to be more stable throughout the last 48 hours 4.  No further cardiac diagnostics necessary at this time 5.  Okay for renal replacement  And dialysis catheter as necessary to improve above  Signed, Serafina Royals M.D. FACC

## 2020-01-19 NOTE — Progress Notes (Signed)
Received patient from hemodialysis via bed. Pt in NAD, A&O x 4. Right chest permcath CDI. O2 on at 6LPM. Tele on and shows NSR.

## 2020-01-20 ENCOUNTER — Encounter: Payer: Self-pay | Admitting: Cardiology

## 2020-01-20 LAB — CULTURE, BLOOD (ROUTINE X 2): Culture: NO GROWTH

## 2020-01-20 LAB — PROTEIN ELECTROPHORESIS, SERUM
A/G Ratio: 1.3 (ref 0.7–1.7)
Albumin ELP: 2.9 g/dL (ref 2.9–4.4)
Alpha-1-Globulin: 0.3 g/dL (ref 0.0–0.4)
Alpha-2-Globulin: 0.8 g/dL (ref 0.4–1.0)
Beta Globulin: 0.6 g/dL — ABNORMAL LOW (ref 0.7–1.3)
Gamma Globulin: 0.6 g/dL (ref 0.4–1.8)
Globulin, Total: 2.3 g/dL (ref 2.2–3.9)
Total Protein ELP: 5.2 g/dL — ABNORMAL LOW (ref 6.0–8.5)

## 2020-01-20 LAB — GLUCOSE, CAPILLARY
Glucose-Capillary: 88 mg/dL (ref 70–99)
Glucose-Capillary: 76 mg/dL (ref 70–99)
Glucose-Capillary: 123 mg/dL — ABNORMAL HIGH (ref 70–99)
Glucose-Capillary: 138 mg/dL — ABNORMAL HIGH (ref 70–99)

## 2020-01-20 LAB — BASIC METABOLIC PANEL
Anion gap: 14 (ref 5–15)
BUN: 51 mg/dL — ABNORMAL HIGH (ref 8–23)
CO2: 25 mmol/L (ref 22–32)
Calcium: 7.5 mg/dL — ABNORMAL LOW (ref 8.9–10.3)
Chloride: 105 mmol/L (ref 98–111)
Creatinine, Ser: 4 mg/dL — ABNORMAL HIGH (ref 0.44–1.00)
GFR calc Af Amer: 11 mL/min — ABNORMAL LOW (ref >60)
GFR calc non Af Amer: 10 mL/min — ABNORMAL LOW (ref >60)
Glucose, Bld: 99 mg/dL (ref 70–99)
Potassium: 3.6 mmol/L (ref 3.5–5.1)
Sodium: 144 mmol/L (ref 135–145)

## 2020-01-20 LAB — HEPATITIS B SURFACE ANTIGEN: Hepatitis B Surface Ag: NEGATIVE — AB

## 2020-01-20 LAB — HEPATITIS B SURFACE ANTIBODY, QUANTITATIVE: Hep B S AB Quant (Post): <3.1 m[IU]/mL — ABNORMAL LOW (ref >9.9)

## 2020-01-20 LAB — PARATHYROID HORMONE, INTACT (NO CA): PTH: 159 pg/mL — ABNORMAL HIGH (ref 15–65)

## 2020-01-20 LAB — LEGIONELLA PNEUMOPHILA SEROGP 1 UR AG: L. pneumophila Serogp 1 Ur Ag: NEGATIVE

## 2020-01-20 LAB — PROCALCITONIN: Procalcitonin: 0.53 ng/mL

## 2020-01-20 MED ORDER — SODIUM CHLORIDE 0.9% FLUSH
10.0000 mL | Freq: Two times a day (BID) | INTRAVENOUS | Status: DC
  Administered 2020-01-20 – 2020-01-22 (×5): 10 mL via INTRAVENOUS

## 2020-01-20 MED ORDER — RENA-VITE PO TABS
1.0000 | ORAL_TABLET | Freq: Every day | ORAL | Status: DC
  Administered 2020-01-20 – 2020-01-22 (×3): 1 via ORAL
  Filled 2020-01-20 (×4): qty 1

## 2020-01-20 MED ORDER — NEPRO/CARBSTEADY PO LIQD
237.0000 mL | Freq: Two times a day (BID) | ORAL | Status: DC
  Administered 2020-01-21 – 2020-01-22 (×3): 237 mL via ORAL

## 2020-01-20 NOTE — Progress Notes (Signed)
PROGRESS NOTE    Dorothy [name]  LYY:[id number] DOB: [birth year] DOA: 01/15/2020 PCP: Letta Median, MD   Brief Narrative:  85 y.o. Caucasian female with a known history of CHF, COPD, type 2 diabetes mellitus, GERD, dyslipidemia, hypertension and obstructive sleep apnea, who presented to the emergency room with acute onset of generalized weakness with associated dyspnea and diminished p.o. intake over the last week.  She admitted to chills without measured fever as well as mild dyspnea and dry cough and occasional wheezing.  No chest pain or palpitations.  No nausea or vomiting or abdominal pain.  No dysuria, oliguria or hematuria or flank pain..  She has chronic respiratory failure on home O2 at 2 L/min.  Upon presentation to the emergency room, heart rate was 53 pulse extremity 91% on 2 L of O2 by nasal cannula and later 94-95% on 3.5 L of O2, with blood pressure initially normal and later 99/46 with otherwise normal vital signs.  BUN and creatinine were 67 and 5.16 compared to 43/1.4 on 10/10/19.  BNP was 256 and CBC showed anemia close to baseline.  Albumin was 3.3 and total protein 6.4.  Portable chest x-ray showed interval mild patchy atelectasis or pneumonia at the right lung base with mild increased patchy atelectasis or pneumonia at the left lung base and possible bilateral small pleural effusions as well as stable borderline cardiomegaly.  5/7: Patient seen and examined.  Persistent hypotension overnight necessitating multiple fluid boluses.  Also baseline bradycardia and atrial fibrillation noted with occasional decreases in heart rate down to the 20s and 30s.  Patient relatively asymptomatic.  Mentating clearly.  Reports feeling "a little funny" but unable to specify further.  No fevers noted.  Lactic acid normal.  Patient has been anuric since admission.  Creatinine markedly elevated.  Nephrology consult called.  5/8: Patient seen and examined.  Patient transferred to ICU overnight  given concerns for hypotension.  On my evaluation this morning patient has a widened pulse pressure though systolics are approximately 120.  Patient is mentating clearly.  Serial lactic acid all within normal limits.  No indication for pressor support.  Case discussed with intensivist Dr Patsey Berthold.  Will transfer patient back to cardiac progressive unit. patient remains anuric.  Kidney function worsening.  Case discussed with nephrology.  No emergent need for dialysis however kidney function continues to worsen can consider dialysis catheter placement for acute dialysis tomorrow.  Respiratory status actually improving.  Patient breathing a little easier.  Patient stable for progressive cardiac unit at this time.  Patient has a widened pulse pressure which drives down the MAP.  Patient systolic blood pressure remains above 90 so she should be stable for progressive floor.  If there are concerns for worsening hypotension and shock recommend stat lactic acid.  Thus far lactic acids have been within normal limits indicating adequate perfusion.  5/9: Patient seen and examined.  Respiratory status appears worse this morning.  Patient with a little increased work of breathing.  Oxygen saturation adequate on 4 4 to half liters nasal cannula.  Still no urine output.  Systolics remain soft.  Stable bradycardia.  Case discussed with nephrology.  Considering lack of improvement in renal function likely to start dialysis within the next 24 hours.  5/10: Patient seen and examined.  Respiratory status about stable from yesterday.  Remains on 4 L.  2 daughters are at bedside.  I explained that kidney function had not improved to the point that we could forego dialysis.  Vascular surgery has been consulted with plans for permacath placement.  Nephrology on consult as well.  Anticipate that patient will get dialysis catheter placement and start hemodialysis inpatient within the next 24 hours.  No fevers over interval.  No signs  of worsening infection.  5/11: Seen and examined during hemodialysis.  Oxygen requirement has alternated between 4 and 6 L.  No obvious clinical improvement in patient.  Continues to be severely weak deconditioned.  Very fatigued.  This may be impacted by the currently running dialysis.  Seen by palliative care, recommendations appreciated.  Seen by nephrology and cardiology as well.   Assessment & Plan:   Active Problems:   Multifocal pneumonia  Multiple comorbidities Functional decline Goals of care conversation Palliative care consulted, recommendations appreciated For now will continue with current scope of treatment Patient DNR/DNI as confirmed by patient and family Outpatient palliative care referral If patient is declining will consider hospice referral in the future.   Multifocal pneumonia. Patient was started on IV Rocephin and Zithromax on admission No fevers noted no worsening signs of infection Oxygen requirement between 4 and 6 L Plan: Continue IV Rocephin Completed IV azithromycin Sputum Gram stain culture: NGTD Urinary pneumonia antigens, unable to collect due to anuria Follow blood cultures, no growth to date Mucolytic's Scheduled duoneb Oxygen as needed, wean as tolerated  Acute on chronic hypoxic respiratory failure secondary to pneumonia See above for acute management Chest x-ray with worsening infiltrate Unclear whether this is fluid or atelectasis Chest CT: possible fluid with infiltrate, evidence of interstitial lung disease Discussed with nephrology Plan: Permacath in place HD per nephrology We will work on finding her outpatient HD chair No IV fluids or diuretics at this time  Sepsis secondary to pneumonia without severe sepsis or septic shock. Manifested by hypothermia, hypotension, hypoxia Despite relatively low maps the patient appears to be perfusing with normal lactic acid Management as above Sepsis symptoms have improved  Acute  kidney injury superimposed on stage IIIb chronic kidney disease Metabolic acidosis Patient received several liters crystalloid and colloid boluses without significant change in kidney function Creatinine actually worsened over interval Renal ultrasound no hydrobilateral renal cyst noted Nephrology consult called, case discussed with Dr. Candiss Norse, recommendations appreciated Plan: Permacath in place Hemodialysis per nephrology No IV fluids or diuretics at this time We will continue to monitor for renal recovery  Atrial fibrillation Symptomatic bradycardia Chronic anticoagulation Patient on low-dose Eliquis No rate control agents appreciated on home med rec Patient has been relatively bradycardic with baseline rate in the 50s with occasional dropped into the 30s Plan: Cardiology consultation.  Patient is established with Dr. Saralyn Pilar from [facility].  Dr. Nehemiah Massed on consult Avoid AV nodal blocking agents Clonidine discontinued Continue Eliquis Per cardiology no further diagnostics necessary at this time Continue telemetry  Dyslipidemia. Statin  Hypertension. All home antihypertensives on hold  Type 2 diabetes mellitus. Hold home Metformin Sliding scale insulin  GERD. PPI   DVT prophylaxis: Eliquis Code Status: Full Family Communication: Daughters at bedside 5/10 Disposition Plan: Status is: Inpatient  Remains inpatient appropriate because:Inpatient level of care appropriate due to severity of illness   Dispo: The patient is from: Home              Anticipated d/c is to: SNF              Anticipated d/c date is: 2 days              Patient currently is not medically  stable to d/c.   Still requiring inpatient hemodialysis.  Remains on 4 to 6 L nasal cannula.  Will need improvement in oxygen status prior to formulation of disposition plan.  Suspect she will need skilled nursing facility placement.   Consultants:   Nephrology- [facility]  Cardiology-[facility]  Procedures:   none  Antimicrobials:   Rocephin (01/15/20-  )  Azithromycin (01/15/20-5/9)   Subjective: Seen and examined Mentating clearly No clear improvement in symptoms  Objective: Vitals:   01/20/20 1100 01/20/20 1115 01/20/20 1130 01/20/20 1244  BP: 105/68 (!) 110/52 114/80 119/84  Pulse: 74 73 73 73  Resp: 16 16 17 18   Temp:    98 F (36.7 C)  TempSrc:    Oral  SpO2: 97% 98% 96% 96%  Weight:      Height:        Intake/Output Summary (Last 24 hours) at 01/20/2020 1408 Last data filed at 01/20/2020 0759 Gross per 24 hour  Intake --  Output 1000 ml  Net -1000 ml   Filed Weights   01/19/20 0400 01/19/20 2242 01/20/20 0505  Weight: 88.1 kg 87.1 kg 87.1 kg    Examination:  General exam: Appears calm and comfortable, appears frail Respiratory system: Bilateral crackles.  Increased work of breathing Cardiovascular system: S1 & S2 heard, RRR. No JVD, murmurs, rubs, gallops or clicks. No pedal edema. Gastrointestinal system: Abdomen is nondistended, soft and nontender. No organomegaly or masses felt. Normal bowel sounds heard. Central nervous system: Alert and oriented. No focal neurological deficits. Extremities: Symmetric 5 x 5 power. Skin: No rashes, lesions or ulcers Psychiatry: Judgement and insight appear normal. Mood & affect appropriate.     Data Reviewed: I have personally reviewed following labs and imaging studies  CBC: Recent Labs  Lab 01/15/20 1847 01/16/20 0513 01/17/20 0723 01/18/20 0604 01/19/20 0707  WBC 7.7 7.8 7.7 6.5 6.9  NEUTROABS 7.0  --  6.7 5.8 6.0  HGB 9.3* 8.3* 8.4* 8.6* 8.7*  HCT 31.5* 27.7* 28.2* 28.0* 28.8*  MCV 87.3 86.6 87.3 83.6 85.5  PLT 244 203 185 189 301   Basic Metabolic Panel: Recent Labs  Lab 01/16/20 2112 01/17/20 0723 01/18/20 0604 01/19/20 0707 01/20/20 0611  NA 138 138 141 140 144  K 4.0 3.8 3.5 3.6 3.6  CL 109 107 106 103 105  CO2 17* 20* 24 24 25   GLUCOSE 125* 127* 130* 118* 99  BUN 65*  65* 67* 69* 51*  CREATININE 5.50* 5.71* 5.75* 5.49* 4.00*  CALCIUM 7.3* 7.2* 7.3* 7.0* 7.5*  PHOS  --   --   --  5.9*  --    GFR: Estimated Creatinine Clearance: 11.6 mL/min (A) (by C-G formula based on SCr of 4 mg/dL (H)). Liver Function Tests: Recent Labs  Lab 01/15/20 1847  AST 15  ALT 15  ALKPHOS 79  BILITOT 0.9  PROT 6.4*  ALBUMIN 3.3*   No results for input(s): LIPASE, AMYLASE in the last 168 hours. No results for input(s): AMMONIA in the last 168 hours. Coagulation Profile: No results for input(s): INR, PROTIME in the last 168 hours. Cardiac Enzymes: No results for input(s): CKTOTAL, CKMB, CKMBINDEX, TROPONINI in the last 168 hours. BNP (last 3 results) No results for input(s): PROBNP in the last 8760 hours. HbA1C: No results for input(s): HGBA1C in the last 72 hours. CBG: Recent Labs  Lab 01/19/20 1224 01/19/20 1932 01/19/20 2249 01/20/20 0756 01/20/20 1242  GLUCAP 117* 112* 93 88 76   Lipid Profile: No  results for input(s): CHOL, HDL, LDLCALC, TRIG, CHOLHDL, LDLDIRECT in the last 72 hours. Thyroid Function Tests: No results for input(s): TSH, T4TOTAL, FREET4, T3FREE, THYROIDAB in the last 72 hours. Anemia Panel: No results for input(s): VITAMINB12, FOLATE, FERRITIN, TIBC, IRON, RETICCTPCT in the last 72 hours. Sepsis Labs: Recent Labs  Lab 01/15/20 1946 01/15/20 2045 01/16/20 0805 01/16/20 1121 01/17/20 0703 01/17/20 0946 01/18/20 0604 01/20/20 0611  PROCALCITON 0.17  --   --   --  0.18  --  0.17 0.53  LATICACIDVEN  --    < > 0.7 0.7 0.8 0.8  --   --    < > = values in this interval not displayed.    Recent Results (from the past 240 hour(s))  Respiratory Panel by RT PCR (Flu A&B, Covid) - Nasopharyngeal Swab     Status: None   Collection Time: 01/15/20  7:38 PM   Specimen: Nasopharyngeal Swab  Result Value Ref Range Status   SARS Coronavirus 2 by RT PCR NEGATIVE NEGATIVE Final    Comment: (NOTE) SARS-CoV-2 target nucleic acids are NOT  DETECTED. The SARS-CoV-2 RNA is generally detectable in upper respiratoy specimens during the acute phase of infection. The lowest concentration of SARS-CoV-2 viral copies this assay can detect is 131 copies/mL. A negative result does not preclude SARS-Cov-2 infection and should not be used as the sole basis for treatment or other patient management decisions. A negative result may occur with  improper specimen collection/handling, submission of specimen other than nasopharyngeal swab, presence of viral mutation(s) within the areas targeted by this assay, and inadequate number of viral copies (<131 copies/mL). A negative result must be combined with clinical observations, patient history, and epidemiological information. The expected result is Negative. Fact Sheet for Patients:  PinkCheek.be Fact Sheet for Healthcare Providers:  GravelBags.it This test is not yet ap proved or cleared by the [place] FDA and  has been authorized for detection and/or diagnosis of SARS-CoV-2 by FDA under an Emergency Use Authorization (EUA). This EUA will remain  in effect (meaning this test can be used) for the duration of the COVID-19 declaration under Section 564(b)(1) of the Act, 21 U.S.C. section 360bbb-3(b)(1), unless the authorization is terminated or revoked sooner.    Influenza A by PCR NEGATIVE NEGATIVE Final   Influenza B by PCR NEGATIVE NEGATIVE Final    Comment: (NOTE) The Xpert Xpress SARS-CoV-2/FLU/RSV assay is intended as an aid in  the diagnosis of influenza from Nasopharyngeal swab specimens and  should not be used as a sole basis for treatment. Nasal washings and  aspirates are unacceptable for Xpert Xpress SARS-CoV-2/FLU/RSV  testing. Fact Sheet for Patients: PinkCheek.be Fact Sheet for Healthcare Providers: GravelBags.it This test is not yet approved or cleared  by the [place] FDA and  has been authorized for detection and/or diagnosis of SARS-CoV-2 by  FDA under an Emergency Use Authorization (EUA). This EUA will remain  in effect (meaning this test can be used) for the duration of the  Covid-19 declaration under Section 564(b)(1) of the Act, 21  U.S.C. section 360bbb-3(b)(1), unless the authorization is  terminated or revoked. Performed at [facility], [address] [place], [place] [address]   Blood Culture (routine x 2)     Status: None   Collection Time: 01/15/20  7:46 PM   Specimen: BLOOD RIGHT WRIST  Result Value Ref Range Status   Specimen Description BLOOD RIGHT WRIST  Final   Special Requests BOTTLES DRAWN AEROBIC AND ANAEROBIC  Final   Culture   Final    NO GROWTH 5 DAYS Performed at [facility], [address] [place], [place] [address]    Report Status 01/20/2020 FINAL  Final  Blood Culture (routine x 2)     Status: None (Preliminary result)   Collection Time: 01/16/20  5:26 AM   Specimen: Left Antecubital; Blood  Result Value Ref Range Status   Specimen Description LEFT ANTECUBITAL  Final   Special Requests   Final    BOTTLES DRAWN AEROBIC AND ANAEROBIC Blood Culture adequate volume   Culture   Final    NO GROWTH 4 DAYS Performed at [facility], [address] [place], [place] [address]    Report Status PENDING  Incomplete  MRSA PCR Screening     Status: None   Collection Time: 01/16/20 11:51 PM   Specimen: Nasopharyngeal  Result Value Ref Range Status   MRSA by PCR NEGATIVE NEGATIVE Final    Comment:        The GeneXpert MRSA Assay (FDA approved for NASAL specimens only), is one component of a comprehensive MRSA colonization surveillance program. It is not intended to diagnose MRSA infection nor to guide or monitor treatment for MRSA infections. Performed at [facility], [address] [place], [place] [address]          Radiology  Studies: PERIPHERAL VASCULAR CATHETERIZATION  Result Date: 01/19/2020 See op note       Scheduled Meds: . atorvastatin  80 mg Oral Daily  . calcium-vitamin D  1 tablet Oral Daily  . Chlorhexidine Gluconate Cloth  6 each Topical Daily  . ciprofloxacin-dexamethasone  4 drop Left EAR BID  . cyanocobalamin  1,000 mcg Intramuscular Q30 days  . doxycycline  100 mg Oral Q12H  . feeding supplement (NEPRO CARB STEADY)  237 mL Oral BID BM  . ferrous sulfate  325 mg Oral Q lunch  . guaiFENesin  600 mg Oral BID  . heparin injection (subcutaneous)  5,000 Units Subcutaneous Q8H  . insulin aspart  0-9 Units Subcutaneous TID WC  . ipratropium-albuterol  3 mL Nebulization Q6H  . multivitamin  1 tablet Oral QHS  . pantoprazole  40 mg Oral Daily   Continuous Infusions: . sodium chloride    . sodium chloride    . sodium chloride 10 mL/hr at 01/20/20 0400  . cefTRIAXone (ROCEPHIN)  IV Stopped (01/20/20 0103)     LOS: 5 days    Time spent: 35 minutes    Sidney Ace, MD [facility] Pager [phone]  If 7PM-7AM, please contact night-coverage 01/20/2020, 2:08 PM

## 2020-01-20 NOTE — Progress Notes (Signed)
OT Cancellation Note  Patient Details Name: Dorothy [name] MRN: [id number] DOB: 1935/03/23   Cancelled Treatment:    Reason Eval/Treat Not Completed: Fatigue/lethargy limiting ability to participate  OT consult received and chart reviewed. Pt reports having just had PT ~30 minutes or so prior and states she is fatigued. In addition, pt had HD this AM and is preparing to eat at this time. Occupational therpist will f/u for evaluation at later date/time as able. Thank you.  Gerrianne Scale, [place], OTR/L ascom [phone] 01/20/20, 3:26 PM

## 2020-01-20 NOTE — Progress Notes (Signed)
[facility] Liaison RN note  This patient has been referred to [facility] for community based palliative care.  Will follow for disposition.  Please call with questions or concerns.  Thank you. Margaretmary Eddy, BSN, RN [facility] Liaison [phone]

## 2020-01-20 NOTE — Progress Notes (Signed)
   01/20/20 1149  Vital Signs  Pulse Rate 80  Resp 20  BP (!) 130/56  Oxygen Therapy  SpO2 97 %  During Hemodialysis Assessment  Blood Flow Rate (mL/min) 250 mL/min  Arterial Pressure (mmHg) -80 mmHg  Venous Pressure (mmHg) 60 mmHg  Transmembrane Pressure (mmHg) 70 mmHg  Ultrafiltration Rate (mL/min) 400 mL/min  Dialysate Flow Rate (mL/min) 500 ml/min  Conductivity: Machine  13.7  HD Safety Checks Performed Yes  KECN 38.1 KECN  Dialysis Fluid Bolus Normal Saline  Bolus Amount (mL) 250 mL  Intra-Hemodialysis Comments Tolerated well;Tx completed  Post-Hemodialysis Assessment  Rinseback Volume (mL) 250 mL  KECN 38.1 V  Dialyzer Clearance Lightly streaked  Duration of HD Treatment -hour(s) 2.5 hour(s)  Hemodialysis Intake (mL) 1000 mL  UF Total -Machine (mL) 500 mL  Net UF (mL) -500 mL  Tolerated HD Treatment Yes  Post-Hemodialysis Comments 2nd treatment  Education / Care Plan  Dialysis Education Provided Yes  Note  Observations no c/o   Hemodialysis Catheter Right Internal jugular Double lumen Permanent (Tunneled)  Placement Date/Time: 01/19/20 1904   Placed prior to admission: Yes  Time Out: Correct patient;Correct site;Correct procedure  Maximum sterile barrier precautions: Hand hygiene;Cap;Mask;Sterile gown;Sterile gloves;Large sterile sheet  Site Prep: Chlor...  Site Condition No complications  Blue Lumen Status Flushed;Heparin locked  Red Lumen Status Flushed;Heparin locked  Purple Lumen Status N/A  Catheter fill solution Heparin 1000 units/ml  Catheter fill volume (Arterial) 2 cc  Catheter fill volume (Venous) 2.2  Dressing Type Occlusive  Dressing Status Clean;Dry;Intact  Interventions Dressing changed  Drainage Description None  Post treatment catheter status Capped and Clamped     01/20/20 1149  Vital Signs  Pulse Rate 80  Resp 20  BP (!) 130/56  Oxygen Therapy  SpO2 97 %  During Hemodialysis Assessment  Blood Flow Rate (mL/min) 250 mL/min  Arterial  Pressure (mmHg) -80 mmHg  Venous Pressure (mmHg) 60 mmHg  Transmembrane Pressure (mmHg) 70 mmHg  Ultrafiltration Rate (mL/min) 400 mL/min  Dialysate Flow Rate (mL/min) 500 ml/min  Conductivity: Machine  13.7  HD Safety Checks Performed Yes  KECN 38.1 KECN  Dialysis Fluid Bolus Normal Saline  Bolus Amount (mL) 250 mL  Intra-Hemodialysis Comments Tolerated well;Tx completed  Post-Hemodialysis Assessment  Rinseback Volume (mL) 250 mL  KECN 38.1 V  Dialyzer Clearance Lightly streaked  Duration of HD Treatment -hour(s) 2.5 hour(s)  Hemodialysis Intake (mL) 1000 mL  UF Total -Machine (mL) 500 mL  Net UF (mL) -500 mL  Tolerated HD Treatment Yes  Post-Hemodialysis Comments 2nd treatment  Education / Care Plan  Dialysis Education Provided Yes  Note  Observations no c/o   Hemodialysis Catheter Right Internal jugular Double lumen Permanent (Tunneled)  Placement Date/Time: 01/19/20 1904   Placed prior to admission: Yes  Time Out: Correct patient;Correct site;Correct procedure  Maximum sterile barrier precautions: Hand hygiene;Cap;Mask;Sterile gown;Sterile gloves;Large sterile sheet  Site Prep: Chlor...  Site Condition No complications  Blue Lumen Status Flushed;Heparin locked  Red Lumen Status Flushed;Heparin locked  Purple Lumen Status N/A  Catheter fill solution Heparin 1000 units/ml  Catheter fill volume (Arterial) 2 cc  Catheter fill volume (Venous) 2.2  Dressing Type Occlusive  Dressing Status Clean;Dry;Intact  Interventions Dressing changed  Drainage Description None  Post treatment catheter status Capped and Clamped  TREATMENT COMPLETED 500 ML REMOVED C/O FEELING TIRED AFTER TREATMENT TODAY NO PROBLEMS DURING TREATMENT

## 2020-01-20 NOTE — Progress Notes (Signed)
[facility] Encounter Note  Patient: Dorothy [name] / Admit Date: 01/15/2020 / Date of Encounter: 01/20/2020, 8:24 AM   Subjective: Patient feels relatively well this morning conversing without any difficulty breathing.  Tolerated dialysis catheter well and ready for dialysis.  Patient has had significant hypotension which appears to have resolved blood pressure more stable today.  Patient has had a discontinuation of amlodipine and clonidine due to this issue.  Heart rate appears to be relatively stable at   60 bpm without evidence of significant long RR intervals or need for pacemaker placement at this time.  Patient's breathing is better after treatment of pulmonary infection.  Patient has had significant acute kidney injury without urine output and therefore may need dialysis as she continues to have difficulty Echo overall normal with ef 60% Review of Systems: Positive for: Shortness of breath Negative for: Vision change, hearing change, syncope, dizziness, nausea, vomiting,diarrhea, bloody stool, stomach pain, cough, congestion, diaphoresis, urinary frequency, urinary pain,skin lesions, skin rashes Others previously listed  Objective: Telemetry: Atrial fibrillation with slow ventricular rate Physical Exam: Blood pressure 130/68, pulse 71, temperature 98.1 F (36.7 C), temperature source Oral, resp. rate 18, height 5\' 4"  (1.626 m), weight 87.1 kg, SpO2 95 %. Body mass index is 32.96 kg/m. General: Well developed, well nourished, in no acute distress. Head: Normocephalic, atraumatic, sclera non-icteric, no xanthomas, nares are without discharge. Neck: No apparent masses Lungs: Normal respirations with some wheezes, some rhonchi, no rales , few basilar crackles   Heart: Irregular rate and rhythm, normal S1 S2, no murmur, no rub, no gallop, PMI is normal size and placement, carotid upstroke normal without bruit, jugular venous pressure normal Abdomen: Soft, non-tender,  non-distended with normoactive bowel sounds. No hepatosplenomegaly. Abdominal aorta is normal size without bruit Extremities: 1+ edema, no clubbing, no cyanosis, no ulcers,  Peripheral: 2+ radial, 2+ femoral, 2+ dorsal pedal pulses Neuro: Alert and oriented. Moves all extremities spontaneously. Psych:  Responds to questions appropriately with a normal affect.   Intake/Output Summary (Last 24 hours) at 01/20/2020 0824 Last data filed at 01/20/2020 0759 Gross per 24 hour  Intake --  Output 1350 ml  Net -1350 ml    Inpatient Medications:  . atorvastatin  80 mg Oral Daily  . calcium-vitamin D  1 tablet Oral Daily  . Chlorhexidine Gluconate Cloth  6 each Topical Daily  . ciprofloxacin-dexamethasone  4 drop Left EAR BID  . cyanocobalamin  1,000 mcg Intramuscular Q30 days  . doxycycline  100 mg Oral Q12H  . ferrous sulfate  325 mg Oral Q lunch  . guaiFENesin  600 mg Oral BID  . heparin injection (subcutaneous)  5,000 Units Subcutaneous Q8H  . insulin aspart  0-9 Units Subcutaneous TID WC  . ipratropium-albuterol  3 mL Nebulization Q6H  . pantoprazole  40 mg Oral Daily   Infusions:  . sodium chloride    . sodium chloride    . sodium chloride 10 mL/hr at 01/20/20 0400  . cefTRIAXone (ROCEPHIN)  IV Stopped (01/20/20 0103)    Labs: Recent Labs    01/19/20 0707 01/20/20 0611  NA 140 144  K 3.6 3.6  CL 103 105  CO2 24 25  GLUCOSE 118* 99  BUN 69* 51*  CREATININE 5.49* 4.00*  CALCIUM 7.0* 7.5*  PHOS 5.9*  --    No results for input(s): AST, ALT, ALKPHOS, BILITOT, PROT, ALBUMIN in the last 72 hours. Recent Labs    01/18/20 0604 01/19/20 0707  WBC 6.5  6.9  NEUTROABS 5.8 6.0  HGB 8.6* 8.7*  HCT 28.0* 28.8*  MCV 83.6 85.5  PLT 189 187   No results for input(s): CKTOTAL, CKMB, TROPONINI in the last 72 hours. Invalid input(s): POCBNP No results for input(s): HGBA1C in the last 72 hours.   Weights: Filed Weights   01/19/20 0400 01/19/20 2242 01/20/20 0505  Weight: 88.1  kg 87.1 kg 87.1 kg     Radiology/Studies:  CT CHEST WO CONTRAST  Result Date: 01/18/2020 CLINICAL DATA:  Shortness of breath EXAM: CT CHEST WITHOUT CONTRAST TECHNIQUE: Multidetector CT imaging of the chest was performed following the standard protocol without IV contrast. COMPARISON:  Chest radiograph dated 01/18/2020 FINDINGS: Cardiovascular: The main pulmonary artery is mildly enlarged, measuring 3.1 cm. Vascular calcifications are seen in the aortic arch. The heart is enlarged. No pericardial effusion. Mediastinum/Nodes: No enlarged mediastinal or axillary lymph nodes. Thyroid gland, trachea, and esophagus demonstrate no significant findings. Lungs/Pleura: There are moderate bilateral pleural effusions with associated atelectasis. Bilateral parenchymal scarring is seen with superimposed atelectasis, ground-glass opacities, and consolidation. There is no pneumothorax. Upper Abdomen: No acute abnormality. Musculoskeletal: Age indeterminate anterior wedge deformities of T10 and T12 are progressed since 06/24/2015. There is focal kyphosis centered at T11-12. IMPRESSION: 1. Bilateral ground-glass opacities and consolidation may represent pneumonia. Moderate bilateral pleural effusions with associated atelectasis. 2. Cardiomegaly. Mildly enlarged main pulmonary artery suggestive of pulmonary hypertension. 3. Age indeterminate anterior wedge deformities of T10 and T12. Aortic Atherosclerosis (ICD10-I70.0). Electronically Signed   By: Zerita Boers M.D.   On: 01/18/2020 14:50   US RENAL  Result Date: 01/16/2020 CLINICAL DATA:  85 year old female with a history of acute kidney injury EXAM: RENAL / URINARY TRACT ULTRASOUND COMPLETE COMPARISON:  04/03/2008 FINDINGS: Right Kidney: Length: 11.3 cm x 5.6 cm x 4.8 cm, 157 cc. No hydronephrosis. Flow confirmed in the hilum. Cystic structure with through transmission of the right upper pole cortex measures 6.1 cm x 3.0 cm x 3.4 cm. Left Kidney: Length: 12.6 cm x 5.0 cm x  5.9 cm, 193 cc. No hydronephrosis. Flow confirmed in the hilum. Cystic structure in the superior left kidney with through transmission and no internal complexity or flow measures 5.2 cm x 5.0 cm by 4.8 cm. Bladder: Appears normal for degree of bladder distention. IMPRESSION: Negative for hydronephrosis of the bilateral kidneys. Right-sided Bosniak 2 cyst and a left-sided Bosniak 1 cyst. Electronically Signed   By: Corrie Mckusick D.O.   On: 01/16/2020 09:21   PERIPHERAL VASCULAR CATHETERIZATION  Result Date: 01/19/2020 See op note  DG Chest Port 1 View  Result Date: 01/18/2020 CLINICAL DATA:  Shortness of breath and hypoxia. EXAM: PORTABLE CHEST 1 VIEW COMPARISON:  01/15/2020 FINDINGS: Stable cardiomegaly. Increased confluent opacity is seen in the left lower lung since prior exam. Left pleural effusion cannot be excluded. Diffuse interstitial infiltrates show no significant change. IMPRESSION: 1. Increased confluent opacity in left lower lung. Left pleural effusion cannot be excluded. 2. Stable cardiomegaly and diffuse interstitial infiltrates, suspicious for edema. Electronically Signed   By: Marlaine Hind M.D.   On: 01/18/2020 12:24   DG Chest Portable 1 View  Result Date: 01/15/2020 CLINICAL DATA:  Shortness of breath and weakness. EXAM: PORTABLE CHEST 1 VIEW COMPARISON:  10/06/2019 FINDINGS: The cardiac silhouette remains borderline enlarged. Linear opacities are again demonstrated in the mid and lower lung zones on the left, resolved in the upper lung zones. Interval mild patchy density at the right lung base. Mildly increased patchy density at  the left lung base with possible small bilateral pleural effusions. Diffuse osteopenia. IMPRESSION: 1. Interval mild patchy atelectasis or pneumonia at the right lung base. 2. Mildly increased patchy atelectasis or pneumonia at the left lung base. 3. Possible small bilateral pleural effusions. 4. Stable borderline cardiomegaly. Electronically Signed   By: Claudie Revering M.D.   On: 01/15/2020 19:12   ECHOCARDIOGRAM COMPLETE  Result Date: 01/16/2020    ECHOCARDIOGRAM REPORT   Patient Name:   FAATIMAH SPIELBERG Date of Exam: 01/16/2020 Medical Rec #:  [id number]       Height:       66.0 in Accession #:    [id number]      Weight:       193.3 lb Date of Birth:  08-Mar-1935       BSA:          1.971 m Patient Age:    85 years        BP:           112/23 mmHg Patient Gender: F               HR:           54 bpm. Exam Location:  [facility] Procedure: 2D Echo, Color Doppler and Cardiac Doppler Indications:     I50.31 CHF-Acute Diastolic  History:         Patient has prior history of Echocardiogram examinations. COPD;                  Risk Factors:Sleep Apnea, Hypertension, Diabetes and                  Dyslipidemia.  Sonographer:     Charmayne Sheer RDCS (AE) Referring Phys:  [id number] Sidney Ace Diagnosing Phys: Serafina Royals MD  Sonographer Comments: No subcostal window and suboptimal apical window. Image acquisition challenging due to patient body habitus and Image acquisition challenging due to COPD. IMPRESSIONS  1. Left ventricular ejection fraction, by estimation, is 55 to 60%. The left ventricle has normal function. The left ventricle has no regional wall motion abnormalities. Left ventricular diastolic function could not be evaluated.  2. Right ventricular systolic function is normal. The right ventricular size is normal. There is mildly elevated pulmonary artery systolic pressure.  3. Left atrial size was mildly dilated.  4. Right atrial size was mildly dilated.  5. The mitral valve is normal in structure. Mild mitral valve regurgitation.  6. The aortic valve is normal in structure. Aortic valve regurgitation is trivial. FINDINGS  Left Ventricle: Left ventricular ejection fraction, by estimation, is 55 to 60%. The left ventricle has normal function. The left ventricle has no regional wall motion abnormalities. The left ventricular internal cavity size was normal in size. There is  no  left ventricular hypertrophy. Left ventricular diastolic function could not be evaluated. Right Ventricle: The right ventricular size is normal. No increase in right ventricular wall thickness. Right ventricular systolic function is normal. There is mildly elevated pulmonary artery systolic pressure. The tricuspid regurgitant velocity is 2.81  m/s, and with an assumed right atrial pressure of 10 mmHg, the estimated right ventricular systolic pressure is 63.8 mmHg. Left Atrium: Left atrial size was mildly dilated. Right Atrium: Right atrial size was mildly dilated. Pericardium: There is no evidence of pericardial effusion. Mitral Valve: The mitral valve is normal in structure. Mild mitral valve regurgitation. MV peak gradient, 13.7 mmHg. The mean mitral valve gradient is 3.0 mmHg. Tricuspid Valve: The tricuspid  valve is normal in structure. Tricuspid valve regurgitation is mild. Aortic Valve: The aortic valve is normal in structure. Aortic valve regurgitation is trivial. Aortic valve mean gradient measures 7.0 mmHg. Aortic valve peak gradient measures 17.3 mmHg. Aortic valve area, by VTI measures 2.07 cm. Pulmonic Valve: The pulmonic valve was normal in structure. Pulmonic valve regurgitation is not visualized. Aorta: The aortic root and ascending aorta are structurally normal, with no evidence of dilitation. IAS/Shunts: No atrial level shunt detected by color flow Doppler.  LEFT VENTRICLE PLAX 2D LVIDd:         4.47 cm  Diastology LVIDs:         2.53 cm  LV e' lateral:   6.74 cm/s LV PW:         1.04 cm  LV E/e' lateral: 25.4 LV IVS:        0.90 cm  LV e' medial:    5.22 cm/s LVOT diam:     1.90 cm  LV E/e' medial:  32.9 LV SV:         92 LV SV Index:   47 LVOT Area:     2.84 cm  RIGHT VENTRICLE RV Basal diam:  2.85 cm LEFT ATRIUM             Index       RIGHT ATRIUM           Index LA diam:        5.30 cm 2.69 cm/m  RA Area:     15.30 cm LA Vol (A2C):   62.9 ml 31.91 ml/m RA Volume:   37.30 ml  18.92 ml/m LA  Vol (A4C):   64.9 ml 32.92 ml/m LA Biplane Vol: 64.9 ml 32.92 ml/m  AORTIC VALVE                    PULMONIC VALVE AV Area (Vmax):    2.18 cm     PV Vmax:       1.42 m/s AV Area (Vmean):   2.08 cm     PV Vmean:      90.400 cm/s AV Area (VTI):     2.07 cm     PV VTI:        0.317 m AV Vmax:           208.00 cm/s  PV Peak grad:  8.1 mmHg AV Vmean:          125.000 cm/s PV Mean grad:  4.0 mmHg AV VTI:            0.445 m AV Peak Grad:      17.3 mmHg AV Mean Grad:      7.0 mmHg LVOT Vmax:         160.00 cm/s LVOT Vmean:        91.500 cm/s LVOT VTI:          0.325 m LVOT/AV VTI ratio: 0.73  AORTA Ao Root diam: 2.90 cm MITRAL VALVE                TRICUSPID VALVE MV Area (PHT): 2.31 cm     TR Peak grad:   31.6 mmHg MV Peak grad:  13.7 mmHg    TR Vmax:        281.00 cm/s MV Mean grad:  3.0 mmHg MV Vmax:       1.85 m/s     SHUNTS MV Vmean:      62.1 cm/s    Systemic  VTI:  0.32 m MV Decel Time: 329 msec     Systemic Diam: 1.90 cm MV E velocity: 171.50 cm/s Serafina Royals MD Electronically signed by Serafina Royals MD Signature Date/Time: 01/16/2020/3:52:32 PM    Final      Assessment and Recommendation  85 y.o. female with chronic nonvalvular atrial fibrillation with slow ventricular rate chronic kidney disease stage V anemia with acute respiratory failure likely due to infection pneumonia slightly improved with oxygenation with need for possible renal replacement 1.  No medication management for blood pressure due to hypotension and concerns 2.  Continue anticoagulation for further risk reduction in stroke with atrial fibrillation and may change over to Eliquis when able 3.  Watch telemetry for any significant long RR intervals or need for further intervention in the future with slow ventricular rate of atrial fibrillation which appeared to be more stable throughout the last 48 hours 4.  No further cardiac diagnostics necessary at this time 5.  Okay for renal replacement without restriction 6.  Continue  ambulation and improvements of above and call if further questions Signed, Serafina Royals M.D. FACC

## 2020-01-20 NOTE — Evaluation (Signed)
Physical Therapy Evaluation Patient Details Name: Dorothy [name] MRN: [id number] DOB: 1935-02-05 Today's Date: 01/20/2020   History of Present Illness  Per MD notes: Pt is an 85 yo female with a known history of CHF, COPD, type 2 diabetes mellitus, GERD, dyslipidemia, hypertension and obstructive sleep apnea, who presented to the emergency room with acute onset of generalized weakness with associated dyspnea and diminished p.o. intake.  Pt with surgical placement of RIJ permcath 01/19/20 with HD initiated 01/20/20.  MD assessment includes: Multifocal pneumonia, Acute on chronic hypoxic respiratory failure, sepsis, A-fib, AKI on CKD IIIb, metabolic acidosis, HTN, and DM II.    Clinical Impression  Pt pleasant and motivated to participate during the session.  Pt somewhat fatigued after HD but put forth good effort during the session.  Pt's BP in supine at onset of session 134/52 with HR 72 bpm and then 149/59 with HR 77 in sitting EOB, nursing notified.  Pt required heavy physical assistance with bed mobility tasks and transfers and was unable to come to full upright standing or advance either LE during amb attempt.  Pt's SpO2 on 6LO2/min was in the upper 80s at rest and mid 80s after transfer with no adverse symptoms other than fatigue reported by pt, nursing notified.  Pt will benefit from PT services in a SNF setting upon discharge to safely address deficits listed in patient problem list for decreased caregiver assistance and eventual return to PLOF.      Follow Up Recommendations SNF;Supervision for mobility/OOB    Equipment Recommendations  None recommended by PT    Recommendations for Other Services       Precautions / Restrictions Precautions Precautions: Fall Restrictions Weight Bearing Restrictions: No      Mobility  Bed Mobility Overal bed mobility: Needs Assistance Bed Mobility: Supine to Sit;Sit to Supine     Supine to sit: Max assist Sit to supine: Max assist;+2 for  physical assistance   General bed mobility comments: Mod verbal cues for sequencing for increased pt participation  Transfers Overall transfer level: Needs assistance Equipment used: Rolling walker (2 wheeled) Transfers: Sit to/from Stand Sit to Stand: Mod assist;+2 physical assistance;From elevated surface         General transfer comment: Pt unable to come to full standing with significant trunk flexion and heavy lean on the RW for support with max standing time grossly 15 sec  Ambulation/Gait             General Gait Details: Pt unable to advance either LE in standing  Stairs            Wheelchair Mobility    Modified Rankin (Stroke Patients Only)       Balance Overall balance assessment: Needs assistance   Sitting balance-Leahy Scale: Fair     Standing balance support: Bilateral upper extremity supported Standing balance-Leahy Scale: Poor Standing balance comment: Heavy lean on the RW to prevent LOB                             Pertinent Vitals/Pain Pain Assessment: No/denies pain    Home Living Family/patient expects to be discharged to:: Private residence Living Arrangements: Alone Available Help at Discharge: Family;Available 24 hours/day Type of Home: Mobile home Home Access: Ramped entrance     Home Layout: One level Home Equipment: Badger - 2 wheels;Bedside commode;Cane - single point Additional Comments: Tub transfer bench; two daughters live out of town but will coordinate  24/7 coverage initially upon return home    Prior Function Level of Independence: Independent with assistive device(s)         Comments: Ind amb in the home without an AD, Mod Ind amb with a RW limited community distances, 2LO2/min 24/7 at home, no fall history, Ind with ADLs     Hand Dominance        Extremity/Trunk Assessment   Upper Extremity Assessment Upper Extremity Assessment: Generalized weakness    Lower Extremity Assessment Lower  Extremity Assessment: Generalized weakness       Communication   Communication: No difficulties  Cognition Arousal/Alertness: Awake/alert Behavior During Therapy: WFL for tasks assessed/performed Overall Cognitive Status: Within Functional Limits for tasks assessed                                        General Comments      Exercises Total Joint Exercises Ankle Circles/Pumps: Strengthening;AROM;5 reps;10 reps;Both Quad Sets: Strengthening;Both;5 reps;10 reps Gluteal Sets: Strengthening;10 reps Heel Slides: AAROM;Both;10 reps;Strengthening Hip ABduction/ADduction: AAROM;Strengthening;Both;10 reps Straight Leg Raises: AAROM;Strengthening;Both;10 reps Long Arc Quad: AROM;Strengthening;Both;10 reps   Assessment/Plan    PT Assessment Patient needs continued PT services  PT Problem List Decreased strength;Decreased activity tolerance;Decreased balance;Decreased mobility;Decreased knowledge of use of DME       PT Treatment Interventions      PT Goals (Current goals can be found in the Care Plan section)  Acute Rehab PT Goals Patient Stated Goal: To get stronger and return home PT Goal Formulation: With patient Time For Goal Achievement: 02/02/20 Potential to Achieve Goals: Fair    Frequency Min 2X/week   Barriers to discharge Inaccessible home environment      Co-evaluation               AM-PAC PT "6 Clicks" Mobility  Outcome Measure Help needed turning from your back to your side while in a flat bed without using bedrails?: A Lot Help needed moving from lying on your back to sitting on the side of a flat bed without using bedrails?: A Lot Help needed moving to and from a bed to a chair (including a wheelchair)?: A Lot Help needed standing up from a chair using your arms (e.g., wheelchair or bedside chair)?: A Lot Help needed to walk in hospital room?: Total Help needed climbing 3-5 steps with a railing? : Total 6 Click Score: 10    End of  Session Equipment Utilized During Treatment: Gait belt;Oxygen Activity Tolerance: Patient tolerated treatment well Patient left: in bed;with call bell/phone within reach;with bed alarm set;with nursing/sitter in room Nurse Communication: Mobility status;Other (comment)(Pt's BP response to sitting and SpO2 during session) PT Visit Diagnosis: Unsteadiness on feet (R26.81);Muscle weakness (generalized) (M62.81);Difficulty in walking, not elsewhere classified (R26.2)    Time: 1610-9604 PT Time Calculation (min) (ACUTE ONLY): 39 min   Charges:   PT Evaluation $PT Eval Moderate Complexity: 1 Mod PT Treatments $Therapeutic Exercise: 8-22 mins       D. Royetta Asal PT, DPT 01/20/20, 2:44 PM

## 2020-01-20 NOTE — Progress Notes (Signed)
[facility]  ROUNDING NOTE   Subjective:  Patient completed second dialysis treatment today. Has tolerated first 2 dialysis treatments well.  Objective:  Vital signs in last 24 hours:  Temp:  [97.5 F (36.4 C)-98.1 F (36.7 C)] 98 F (36.7 C) (05/11 1244) Pulse Rate:  [66-78] 73 (05/11 1244) Resp:  [14-22] 18 (05/11 1244) BP: (90-135)/(46-86) 119/84 (05/11 1244) SpO2:  [89 %-100 %] 96 % (05/11 1244) Weight:  [87.1 kg] 87.1 kg (05/11 0505)  Weight change: -1.043 kg Filed Weights   01/19/20 0400 01/19/20 2242 01/20/20 0505  Weight: 88.1 kg 87.1 kg 87.1 kg    Intake/Output: I/O last 3 completed shifts: In: 101.9 [IV Piggyback:101.9] Out: 1950 [Urine:1950]   Intake/Output this shift:  Total I/O In: -  Out: 100 [Urine:100]  Physical Exam: General: No acute distress  Head: Normocephalic, atraumatic. Moist oral mucosal membranes  Eyes: Anicteric  Neck: Supple, trachea midline  Lungs:  Clear to auscultation, normal effort  Heart: S1S2 irregular  Abdomen:  Soft, nontender, bowel sounds present  Extremities: Trace peripheral edema.  Neurologic: Awake, alert, following commands  Skin: No lesions  Access: Right IJ PermCath    Basic Metabolic Panel: Recent Labs  Lab 01/16/20 2112 01/16/20 2112 01/17/20 0723 01/17/20 0723 01/18/20 0604 01/19/20 0707 01/20/20 0611  NA 138  --  138  --  141 140 144  K 4.0  --  3.8  --  3.5 3.6 3.6  CL 109  --  107  --  106 103 105  CO2 17*  --  20*  --  '24 24 25  '$ GLUCOSE 125*  --  127*  --  130* 118* 99  BUN 65*  --  65*  --  67* 69* 51*  CREATININE 5.50*  --  5.71*  --  5.75* 5.49* 4.00*  CALCIUM 7.3*   < > 7.2*   < > 7.3* 7.0* 7.5*  PHOS  --   --   --   --   --  5.9*  --    < > = values in this interval not displayed.    Liver Function Tests: Recent Labs  Lab 01/15/20 1847  AST 15  ALT 15  ALKPHOS 79  BILITOT 0.9  PROT 6.4*  ALBUMIN 3.3*   No results for input(s): LIPASE, AMYLASE in the last 168  hours. No results for input(s): AMMONIA in the last 168 hours.  CBC: Recent Labs  Lab 01/15/20 1847 01/16/20 0513 01/17/20 0723 01/18/20 0604 01/19/20 0707  WBC 7.7 7.8 7.7 6.5 6.9  NEUTROABS 7.0  --  6.7 5.8 6.0  HGB 9.3* 8.3* 8.4* 8.6* 8.7*  HCT 31.5* 27.7* 28.2* 28.0* 28.8*  MCV 87.3 86.6 87.3 83.6 85.5  PLT 244 203 185 189 187    Cardiac Enzymes: No results for input(s): CKTOTAL, CKMB, CKMBINDEX, TROPONINI in the last 168 hours.  BNP: Invalid input(s): POCBNP  CBG: Recent Labs  Lab 01/19/20 1224 01/19/20 1932 01/19/20 2249 01/20/20 0756 01/20/20 1242  GLUCAP 117* 112* 93 88 76    Microbiology: Results for orders placed or performed during the hospital encounter of 01/15/20  Respiratory Panel by RT PCR (Flu A&B, Covid) - Nasopharyngeal Swab     Status: None   Collection Time: 01/15/20  7:38 PM   Specimen: Nasopharyngeal Swab  Result Value Ref Range Status   SARS Coronavirus 2 by RT PCR NEGATIVE NEGATIVE Final    Comment: (NOTE) SARS-CoV-2 target nucleic acids are NOT DETECTED. The SARS-CoV-2 RNA  is generally detectable in upper respiratoy specimens during the acute phase of infection. The lowest concentration of SARS-CoV-2 viral copies this assay can detect is 131 copies/mL. A negative result does not preclude SARS-Cov-2 infection and should not be used as the sole basis for treatment or other patient management decisions. A negative result may occur with  improper specimen collection/handling, submission of specimen other than nasopharyngeal swab, presence of viral mutation(s) within the areas targeted by this assay, and inadequate number of viral copies (<131 copies/mL). A negative result must be combined with clinical observations, patient history, and epidemiological information. The expected result is Negative. Fact Sheet for Patients:  PinkCheek.be Fact Sheet for Healthcare Providers:   GravelBags.it This test is not yet ap proved or cleared by the [place] FDA and  has been authorized for detection and/or diagnosis of SARS-CoV-2 by FDA under an Emergency Use Authorization (EUA). This EUA will remain  in effect (meaning this test can be used) for the duration of the COVID-19 declaration under Section 564(b)(1) of the Act, 21 U.S.C. section 360bbb-3(b)(1), unless the authorization is terminated or revoked sooner.    Influenza A by PCR NEGATIVE NEGATIVE Final   Influenza B by PCR NEGATIVE NEGATIVE Final    Comment: (NOTE) The Xpert Xpress SARS-CoV-2/FLU/RSV assay is intended as an aid in  the diagnosis of influenza from Nasopharyngeal swab specimens and  should not be used as a sole basis for treatment. Nasal washings and  aspirates are unacceptable for Xpert Xpress SARS-CoV-2/FLU/RSV  testing. Fact Sheet for Patients: PinkCheek.be Fact Sheet for Healthcare Providers: GravelBags.it This test is not yet approved or cleared by the [place] FDA and  has been authorized for detection and/or diagnosis of SARS-CoV-2 by  FDA under an Emergency Use Authorization (EUA). This EUA will remain  in effect (meaning this test can be used) for the duration of the  Covid-19 declaration under Section 564(b)(1) of the Act, 21  U.S.C. section 360bbb-3(b)(1), unless the authorization is  terminated or revoked. Performed at [facility], [address] [place], [place] [address]   Blood Culture (routine x 2)     Status: None   Collection Time: 01/15/20  7:46 PM   Specimen: BLOOD RIGHT WRIST  Result Value Ref Range Status   Specimen Description BLOOD RIGHT WRIST  Final   Special Requests BOTTLES DRAWN AEROBIC AND ANAEROBIC  Final   Culture   Final    NO GROWTH 5 DAYS Performed at [facility], [address] [place], [place] [address]    Report Status 01/20/2020  FINAL  Final  Blood Culture (routine x 2)     Status: None (Preliminary result)   Collection Time: 01/16/20  5:26 AM   Specimen: Left Antecubital; Blood  Result Value Ref Range Status   Specimen Description LEFT ANTECUBITAL  Final   Special Requests   Final    BOTTLES DRAWN AEROBIC AND ANAEROBIC Blood Culture adequate volume   Culture   Final    NO GROWTH 4 DAYS Performed at [facility], [address] [place], [place] [address]    Report Status PENDING  Incomplete  MRSA PCR Screening     Status: None   Collection Time: 01/16/20 11:51 PM   Specimen: Nasopharyngeal  Result Value Ref Range Status   MRSA by PCR NEGATIVE NEGATIVE Final    Comment:        The GeneXpert MRSA Assay (FDA approved for NASAL specimens only), is one component of a comprehensive  MRSA colonization surveillance program. It is not intended to diagnose MRSA infection nor to guide or monitor treatment for MRSA infections. Performed at [facility], [address] [place], [place] [address]     Coagulation Studies: No results for input(s): LABPROT, INR in the last 72 hours.  Urinalysis: Recent Labs    01/18/20 1813  COLORURINE YELLOW*  LABSPEC 1.012  PHURINE 5.0  GLUCOSEU NEGATIVE  HGBUR SMALL*  BILIRUBINUR NEGATIVE  KETONESUR NEGATIVE  PROTEINUR 30*  NITRITE NEGATIVE  LEUKOCYTESUR MODERATE*      Imaging: PERIPHERAL VASCULAR CATHETERIZATION  Result Date: 01/19/2020 See op note    Medications:   . sodium chloride    . sodium chloride    . sodium chloride 10 mL/hr at 01/20/20 0400  . cefTRIAXone (ROCEPHIN)  IV Stopped (01/20/20 0103)   . atorvastatin  80 mg Oral Daily  . calcium-vitamin D  1 tablet Oral Daily  . Chlorhexidine Gluconate Cloth  6 each Topical Daily  . ciprofloxacin-dexamethasone  4 drop Left EAR BID  . cyanocobalamin  1,000 mcg Intramuscular Q30 days  . doxycycline  100 mg Oral Q12H  . ferrous sulfate  325 mg Oral Q lunch  . guaiFENesin  600  mg Oral BID  . heparin injection (subcutaneous)  5,000 Units Subcutaneous Q8H  . insulin aspart  0-9 Units Subcutaneous TID WC  . ipratropium-albuterol  3 mL Nebulization Q6H  . pantoprazole  40 mg Oral Daily   sodium chloride, sodium chloride, sodium chloride, acetaminophen **OR** acetaminophen, albuterol, alteplase, heparin, lidocaine (PF), lidocaine-prilocaine, ondansetron **OR** ondansetron (ZOFRAN) IV, ondansetron (ZOFRAN) IV, pentafluoroprop-tetrafluoroeth, sodium chloride, traZODone  Assessment/ Plan:  85 y.o. female with atrial fibrillation, congestive heart failure, COPD, diabetes mellitus, hypertension, obstructive sleep apnea, chronic kidney disease stage IIIb baseline EGFR 35 who was admitted with acute kidney injury, multifocal pneumonia.  1.  Acute kidney injury/chronic kidney disease stage IIIb baseline creatinine 1.4/EGFR 35.  Renal ultrasound negative for hydronephrosis but bilateral cysts noted. -Patient has been initiated on hemodialysis.  Thus far tolerating well.  Has right IJ PermCath in place.  We will need to find outpatient hemodialysis center for the patient.  2.  Metabolic acidosis.  Improved a serum bicarbonate now up to 25.  Continue to monitor.  3.  Anemia of chronic kidney disease.  Once patient on scheduled dialysis days we will start the patient on Epogen..   LOS: 5 Ronnett Pullin 5/11/20211:47 PM

## 2020-01-21 DIAGNOSIS — Z7189 Other specified counseling: Secondary | ICD-10-CM

## 2020-01-21 DIAGNOSIS — Z515 Encounter for palliative care: Secondary | ICD-10-CM

## 2020-01-21 DIAGNOSIS — N189 Chronic kidney disease, unspecified: Secondary | ICD-10-CM

## 2020-01-21 DIAGNOSIS — J9621 Acute and chronic respiratory failure with hypoxia: Secondary | ICD-10-CM

## 2020-01-21 DIAGNOSIS — R531 Weakness: Secondary | ICD-10-CM

## 2020-01-21 DIAGNOSIS — I482 Chronic atrial fibrillation, unspecified: Secondary | ICD-10-CM

## 2020-01-21 LAB — GLUCOSE, CAPILLARY
Glucose-Capillary: 110 mg/dL — ABNORMAL HIGH (ref 70–99)
Glucose-Capillary: 101 mg/dL — ABNORMAL HIGH (ref 70–99)
Glucose-Capillary: 186 mg/dL — ABNORMAL HIGH (ref 70–99)
Glucose-Capillary: 172 mg/dL — ABNORMAL HIGH (ref 70–99)

## 2020-01-21 LAB — BASIC METABOLIC PANEL
Anion gap: 9 (ref 5–15)
BUN: 26 mg/dL — ABNORMAL HIGH (ref 8–23)
CO2: 30 mmol/L (ref 22–32)
Calcium: 7.7 mg/dL — ABNORMAL LOW (ref 8.9–10.3)
Chloride: 99 mmol/L (ref 98–111)
Creatinine, Ser: 2.32 mg/dL — ABNORMAL HIGH (ref 0.44–1.00)
GFR calc Af Amer: 22 mL/min — ABNORMAL LOW (ref >60)
GFR calc non Af Amer: 19 mL/min — ABNORMAL LOW (ref >60)
Glucose, Bld: 128 mg/dL — ABNORMAL HIGH (ref 70–99)
Potassium: 3.3 mmol/L — ABNORMAL LOW (ref 3.5–5.1)
Sodium: 138 mmol/L (ref 135–145)

## 2020-01-21 LAB — CULTURE, BLOOD (ROUTINE X 2)
Culture: NO GROWTH
Special Requests: ADEQUATE

## 2020-01-21 MED ORDER — POTASSIUM CHLORIDE CRYS ER 20 MEQ PO TBCR
40.0000 meq | EXTENDED_RELEASE_TABLET | Freq: Once | ORAL | Status: AC
  Administered 2020-01-21: 12:00:00 40 meq via ORAL
  Filled 2020-01-21: qty 2

## 2020-01-21 MED ORDER — APIXABAN 2.5 MG PO TABS
2.5000 mg | ORAL_TABLET | Freq: Two times a day (BID) | ORAL | Status: DC
  Administered 2020-01-21 – 2020-01-23 (×4): 2.5 mg via ORAL
  Filled 2020-01-21 (×4): qty 1

## 2020-01-21 NOTE — Progress Notes (Addendum)
Palliative: Mrs. Kirtley is sitting up quietly in bed.  She greets me making and mostly keeping eye contact.  She appears chronically ill and frail, morbidly obese.  She is alert and oriented, able to make her needs known.  As we are talking, her 2 daughters enter and joined the conversation.  I asked Mrs. Brys how she is tolerating dialysis.  She tells me so-so.  She states that she feels better for fluid reduction, but still feels very fatigued.  She continues to be agreeable to rehab, options discussed, TOC working for referral.  I asked Mrs. [place] if she understands what it would be like to stop dialysis.  She states that this was not discussed with her.  I share that at any point, she desires to stop dialysis it is her choice how we care for her.  I shared that most people, want to become dependent on dialysis, and stop, they have a very short life span, 2 weeks or less.  She states understanding.  Sure that she may never choose to start dialysis, but if she does, we will make sure that she is comfortable and respected.  Conference with attending, bedside nursing staff, OT, and TOC team related to patient condition, needs, goals of care.  Plan: Continue HD, agreeable to rehab-no facility choice at this point, agreeable to outpatient palliative to follow.  61 minutes Quinn Axe, NP Palliative Medicine Team Team Phone # [phone] Greater than 50% of this time was spent counseling and coordinating care related to the above assessment and plan.

## 2020-01-21 NOTE — Plan of Care (Signed)
Patient OOB to recliner for most of shift, improvement in appetite noted, no c/o pain, remains on 5L O2.   Problem: Clinical Measurements: Goal: Ability to maintain clinical measurements within normal limits will improve Outcome: Progressing   Problem: Activity: Goal: Risk for activity intolerance will decrease Outcome: Progressing   Problem: Nutrition: Goal: Adequate nutrition will be maintained Outcome: Progressing

## 2020-01-21 NOTE — Progress Notes (Signed)
Patient ID: Dorothy [name], female   DOB: December 16, 1934, 85 y.o.   MRN: [id number] [facility] PROGRESS NOTE  Dorothy [name] PRF:[id number] DOB: 10-Jan-1935 DOA: 01/15/2020 PCP: Letta Median, MD  HPI/Subjective: Patient states that she does not feel well but cannot elaborate on how she feels.  She feels like she is going to pass out.  She does feel dizzy.  She has a poor appetite.  She still on 5 L of oxygen.  Objective: Vitals:   01/21/20 1126 01/21/20 1409  BP: (!) 136/49   Pulse: 81 78  Resp:  18  Temp: 98.2 F (36.8 C)   SpO2: 90% (!) 88%    Intake/Output Summary (Last 24 hours) at 01/21/2020 1508 Last data filed at 01/21/2020 1013 Gross per 24 hour  Intake 240 ml  Output 650 ml  Net -410 ml   Filed Weights   01/19/20 2242 01/20/20 0505 01/21/20 0520  Weight: 87.1 kg 87.1 kg 87.3 kg    ROS: Review of Systems  Constitutional: Negative for chills and fever.  Eyes: Negative for blurred vision.  Respiratory: Positive for shortness of breath. Negative for cough.   Cardiovascular: Negative for chest pain.  Gastrointestinal: Negative for constipation, diarrhea, nausea and vomiting.  Genitourinary: Negative for dysuria.  Musculoskeletal: Negative for joint pain.  Neurological: Negative for dizziness and headaches.   Exam: Physical Exam  Constitutional: She is oriented to person, place, and time.  HENT:  Nose: No mucosal edema.  Mouth/Throat: No oropharyngeal exudate or posterior oropharyngeal edema.  Eyes: Conjunctivae and lids are normal.  Neck: Carotid bruit is not present.  Cardiovascular: S1 normal and S2 normal. Exam reveals no gallop.  No murmur heard. Respiratory: No respiratory distress. She has decreased breath sounds in the right lower field and the left lower field. She has no wheezes. She has no rhonchi. She has no rales.  GI: Soft. Bowel sounds are normal. There is no abdominal tenderness.  Musculoskeletal:     Right ankle: Swelling present.   Left ankle: Swelling present.  Lymphadenopathy:    She has no cervical adenopathy.  Neurological: She is alert and oriented to person, place, and time. No cranial nerve deficit.  Skin: Skin is warm. No rash noted. Nails show no clubbing.  Psychiatric: She has a normal mood and affect.      Data Reviewed: Basic Metabolic Panel: Recent Labs  Lab 01/17/20 0723 01/18/20 0604 01/19/20 0707 01/20/20 0611 01/21/20 0431  NA 138 141 140 144 138  K 3.8 3.5 3.6 3.6 3.3*  CL 107 106 103 105 99  CO2 20* 24 24 25 30   GLUCOSE 127* 130* 118* 99 128*  BUN 65* 67* 69* 51* 26*  CREATININE 5.71* 5.75* 5.49* 4.00* 2.32*  CALCIUM 7.2* 7.3* 7.0* 7.5* 7.7*  PHOS  --   --  5.9*  --   --    Liver Function Tests: Recent Labs  Lab 01/15/20 1847  AST 15  ALT 15  ALKPHOS 79  BILITOT 0.9  PROT 6.4*  ALBUMIN 3.3*   CBC: Recent Labs  Lab 01/15/20 1847 01/16/20 0513 01/17/20 0723 01/18/20 0604 01/19/20 0707  WBC 7.7 7.8 7.7 6.5 6.9  NEUTROABS 7.0  --  6.7 5.8 6.0  HGB 9.3* 8.3* 8.4* 8.6* 8.7*  HCT 31.5* 27.7* 28.2* 28.0* 28.8*  MCV 87.3 86.6 87.3 83.6 85.5  PLT 244 203 185 189 187   BNP (last 3 results) Recent Labs    10/05/19 1332 01/15/20 1847 01/17/20 9357  BNP 248.0* 256.0* 308.0*    CBG: Recent Labs  Lab 01/20/20 1242 01/20/20 1622 01/20/20 2059 01/21/20 0755 01/21/20 1126  GLUCAP 76 123* 138* 110* 101*    Recent Results (from the past 240 hour(s))  Respiratory Panel by RT PCR (Flu A&B, Covid) - Nasopharyngeal Swab     Status: None   Collection Time: 01/15/20  7:38 PM   Specimen: Nasopharyngeal Swab  Result Value Ref Range Status   SARS Coronavirus 2 by RT PCR NEGATIVE NEGATIVE Final    Comment: (NOTE) SARS-CoV-2 target nucleic acids are NOT DETECTED. The SARS-CoV-2 RNA is generally detectable in upper respiratoy specimens during the acute phase of infection. The lowest concentration of SARS-CoV-2 viral copies this assay can detect is 131 copies/mL. A negative  result does not preclude SARS-Cov-2 infection and should not be used as the sole basis for treatment or other patient management decisions. A negative result may occur with  improper specimen collection/handling, submission of specimen other than nasopharyngeal swab, presence of viral mutation(s) within the areas targeted by this assay, and inadequate number of viral copies (<131 copies/mL). A negative result must be combined with clinical observations, patient history, and epidemiological information. The expected result is Negative. Fact Sheet for Patients:  PinkCheek.be Fact Sheet for Healthcare Providers:  GravelBags.it This test is not yet ap proved or cleared by the [place] FDA and  has been authorized for detection and/or diagnosis of SARS-CoV-2 by FDA under an Emergency Use Authorization (EUA). This EUA will remain  in effect (meaning this test can be used) for the duration of the COVID-19 declaration under Section 564(b)(1) of the Act, 21 U.S.C. section 360bbb-3(b)(1), unless the authorization is terminated or revoked sooner.    Influenza A by PCR NEGATIVE NEGATIVE Final   Influenza B by PCR NEGATIVE NEGATIVE Final    Comment: (NOTE) The Xpert Xpress SARS-CoV-2/FLU/RSV assay is intended as an aid in  the diagnosis of influenza from Nasopharyngeal swab specimens and  should not be used as a sole basis for treatment. Nasal washings and  aspirates are unacceptable for Xpert Xpress SARS-CoV-2/FLU/RSV  testing. Fact Sheet for Patients: PinkCheek.be Fact Sheet for Healthcare Providers: GravelBags.it This test is not yet approved or cleared by the [place] FDA and  has been authorized for detection and/or diagnosis of SARS-CoV-2 by  FDA under an Emergency Use Authorization (EUA). This EUA will remain  in effect (meaning this test can be used) for the  duration of the  Covid-19 declaration under Section 564(b)(1) of the Act, 21  U.S.C. section 360bbb-3(b)(1), unless the authorization is  terminated or revoked. Performed at [facility], [address] [place], [place] [address]   Blood Culture (routine x 2)     Status: None   Collection Time: 01/15/20  7:46 PM   Specimen: BLOOD RIGHT WRIST  Result Value Ref Range Status   Specimen Description BLOOD RIGHT WRIST  Final   Special Requests BOTTLES DRAWN AEROBIC AND ANAEROBIC  Final   Culture   Final    NO GROWTH 5 DAYS Performed at [facility], [address] [place], [place] [address]    Report Status 01/20/2020 FINAL  Final  Blood Culture (routine x 2)     Status: None   Collection Time: 01/16/20  5:26 AM   Specimen: Left Antecubital; Blood  Result Value Ref Range Status   Specimen Description LEFT ANTECUBITAL  Final   Special Requests   Final    BOTTLES DRAWN AEROBIC AND  ANAEROBIC Blood Culture adequate volume   Culture   Final    NO GROWTH 5 DAYS Performed at [facility], [address] [place], [place] [address]    Report Status 01/21/2020 FINAL  Final  MRSA PCR Screening     Status: None   Collection Time: 01/16/20 11:51 PM   Specimen: Nasopharyngeal  Result Value Ref Range Status   MRSA by PCR NEGATIVE NEGATIVE Final    Comment:        The GeneXpert MRSA Assay (FDA approved for NASAL specimens only), is one component of a comprehensive MRSA colonization surveillance program. It is not intended to diagnose MRSA infection nor to guide or monitor treatment for MRSA infections. Performed at [facility], [address] [place], [place] [address]      Studies: PERIPHERAL VASCULAR CATHETERIZATION  Result Date: 01/19/2020 See op note   Scheduled Meds: . apixaban  2.5 mg Oral BID  . atorvastatin  80 mg Oral Daily  . calcium-vitamin D  1 tablet Oral Daily  . Chlorhexidine Gluconate Cloth  6 each Topical Daily  .  ciprofloxacin-dexamethasone  4 drop Left EAR BID  . cyanocobalamin  1,000 mcg Intramuscular Q30 days  . doxycycline  100 mg Oral Q12H  . feeding supplement (NEPRO CARB STEADY)  237 mL Oral BID BM  . ferrous sulfate  325 mg Oral Q lunch  . guaiFENesin  600 mg Oral BID  . insulin aspart  0-9 Units Subcutaneous TID WC  . ipratropium-albuterol  3 mL Nebulization Q6H  . multivitamin  1 tablet Oral QHS  . pantoprazole  40 mg Oral Daily  . sodium chloride flush  10 mL Intravenous Q12H   Continuous Infusions: . sodium chloride    . sodium chloride    . sodium chloride 250 mL (01/20/20 2006)    Assessment/Plan:  1. Acute on chronic hypoxic respiratory failure.  Patient chronically wears 2 L of oxygen and currently on 5 L.  Having trouble tapering her down.  Prior to getting out of the hospital would like down on the oxygen a little bit further. 2. Sepsis with multifocal pneumonia, present on admission.  Initially started on Rocephin and Zithromax.  Now looks like the patient is on doxycycline. 3. Acute kidney injury superimposed on chronic kidney disease stage IIIb.  Patient had 2 dialysis sessions and nephrology is waiting to see if her kidney function improves. 4. Chronic atrial fibrillation and bradycardia with pauses.  Restart Eliquis this evening.  Clonidine discontinued.  Cardiology does not recommend any further testing or procedures for pauses and bradycardia 5. Weakness.  Physical therapy recommending rehab      Code Status:     Code Status Orders  (From admission, onward)         Start     Ordered   01/16/20 1443  Do not attempt resuscitation (DNR)  Continuous    Question Answer Comment  In the event of cardiac or respiratory ARREST Do not call a "code blue"   In the event of cardiac or respiratory ARREST Do not perform Intubation, CPR, defibrillation or ACLS   In the event of cardiac or respiratory ARREST Use medication by any route, position, wound care, and other  measures to relive pain and suffering. May use oxygen, suction and manual treatment of airway obstruction as needed for comfort.      01/16/20 1442        Code Status History    Date Active Date Inactive Code  Status Order ID Comments User Context   01/15/2020 2045 01/16/2020 1442 Full Code [id number]  Sidney Ace Arvella Merles, MD ED   10/06/2019 1214 10/12/2019 0006 Full Code [id number]  Enzo Bi, MD Inpatient   12/19/2016 0023 12/22/2016 1915 Full Code [id number]  Lance Coon, MD Inpatient   01/09/2015 1308 01/10/2015 1949 Full Code [id number]  Leonard Schwartz, RN Inpatient   Advance Care Planning Activity     Family Communication: Left message for daughter on the phone Disposition Plan: Status is: Inpatient  Dispo: The patient is from: Home              Anticipated d/c is to: Rehab              Anticipated d/c date is: I do not have a anticipated discharge date as of yet.  Nephrology waiting to see if kidney function improves on its own or will need continued dialysis.              Patient currently requires hospitalization to see if her kidney function will improve on its own or whether she would need continued dialysis.  If she needs continued dialysis will need an outpatient dialysis slot prior to disposition.  Consultants:  Nephrology  Vascular surgery  Palliative care  Antibiotics:  Doxycycline  Time spent: 28 minutes  [place]

## 2020-01-21 NOTE — Evaluation (Signed)
Occupational Therapy Evaluation Patient Details Name: Dorothy [name] MRN: [id number] DOB: [birth year] Today's Date: 01/21/2020    History of Present Illness Per MD notes: Pt is an 85 yo female with a known history of CHF, COPD, type 2 diabetes mellitus, GERD, dyslipidemia, hypertension and obstructive sleep apnea, who presented to the emergency room with acute onset of generalized weakness with associated dyspnea and diminished p.o. intake.  Pt with surgical placement of RIJ permcath 01/19/20 with HD initiated 01/20/20.  MD assessment includes: Multifocal pneumonia, Acute on chronic hypoxic respiratory failure, sepsis, A-fib, AKI on CKD IIIb, metabolic acidosis, HTN, and DM II.   Clinical Impression   Pt was seen for OT evaluation this date. Prior to hospital admission, pt was MOD I with ADLs/ADL mobility. Pt lives alone in mobile home with ramped entrance. Currently pt demonstrates impairments as described below (See OT problem list) which functionally limit her ability to perform ADL/self-care tasks. Pt currently requires MOD A for sup to sit with HOB elevated, MIN/MOD A for sit to stand from elevated bed (~4"), MIN A with UB ADLs and MOD/MAX A with LB ADLs.  Pt would benefit from skilled OT to address noted impairments and functional limitations (see below for any additional details) in order to maximize safety and independence while minimizing falls risk and caregiver burden. Upon hospital discharge, recommend STR to maximize pt safety and return to PLOF. Pt did de-sat on 4Lnc with activity, requiring 5L to sustain/recover, in addition, pt with latent orthostatic BP reading (~2 mins after initially standing as it could not be taken in standing, taken immediately upon sitting with 20 SBP decrease). See mobility and balance sections for further detail.    Follow Up Recommendations  SNF;Supervision - Intermittent    Equipment Recommendations  3 in 1 bedside commode;Tub/shower seat    Recommendations  for Other Services       Precautions / Restrictions Precautions Precautions: Fall Restrictions Weight Bearing Restrictions: No      Mobility Bed Mobility Overal bed mobility: Needs Assistance Bed Mobility: Supine to Sit     Supine to sit: Mod assist;HOB elevated     General bed mobility comments: Dr. Leslye Peer had titrated O2 to 4Lnc just prior to OT entering room, checked initially at 89% on the 4L, as time progressed while still at bed level, pt gradually decreased to 83% and O2 set back to 5Lnc in prep for activity. Pt at 90-92% at rest on 5Lnc, 88-89% with activity, but quickly recovers with cues to perform PLB/breathe in through nose.  Transfers Overall transfer level: Needs assistance Equipment used: Rolling walker (2 wheeled) Transfers: Sit to/from Stand Sit to Stand: Min assist;Mod assist;From elevated surface         General transfer comment: pt with significant trunk flexion with standing which pt's daughters who are present on evaluation state this is normal for her. Pt tolerates ~45 sec-1 min standing to complete shuffle side steps from EOB to recliner.    Balance Overall balance assessment: Needs assistance Sitting-balance support: Single extremity supported Sitting balance-Leahy Scale: Fair Sitting balance - Comments: pt BP supine 136/49, sitting 139/64, pt does c/o some dizziness with change of position that resolves somewhat with 4 mins sitting.   Standing balance support: Bilateral upper extremity supported;During functional activity Standing balance-Leahy Scale: Poor Standing balance comment: Pt with heavy lean on RW, but does demo decreased need for assistance to sustain static standing balance. F balane with static stand, P with fxl mobility. BP unable to be  taken in standing. Pt with latent orthostatic BP, taken in sitting immediately after completing transfer and is at 119/60. Pt reports feeling less "swimmy headed" one LEs elevated.                            ADL either performed or assessed with clinical judgement   ADL Overall ADL's : Needs assistance/impaired Eating/Feeding: Set up;Sitting   Grooming: Wash/dry hands;Wash/dry face;Oral care;Set up;Sitting   Upper Body Bathing: Minimal assistance;Sitting   Lower Body Bathing: Moderate assistance;Maximal assistance;Sitting/lateral leans   Upper Body Dressing : Minimal assistance;Sitting   Lower Body Dressing: Moderate assistance;Maximal assistance;Sit to/from stand   Toilet Transfer: Minimal assistance;Moderate assistance;Stand-pivot;BSC;RW   Toileting- Clothing Manipulation and Hygiene: Moderate assistance;Maximal assistance;Sit to/from stand       Functional mobility during ADLs: Minimal assistance;Rolling walker(to take 4-5 shuffling steps to her right from EOB to recliner.)       Vision Baseline Vision/History: Wears glasses Patient Visual Report: No change from baseline       Perception     Praxis      Pertinent Vitals/Pain Pain Assessment: No/denies pain     Hand Dominance     Extremity/Trunk Assessment Upper Extremity Assessment Upper Extremity Assessment: Generalized weakness(shld ROM to 1/2 arc, MMT grossly 3-/5, elbow and grip grossly 3+/5 bilaterally)   Lower Extremity Assessment Lower Extremity Assessment: Defer to PT evaluation;Generalized weakness   Cervical / Trunk Assessment Cervical / Trunk Assessment: Kyphotic   Communication Communication Communication: No difficulties   Cognition Arousal/Alertness: Awake/alert Behavior During Therapy: WFL for tasks assessed/performed Overall Cognitive Status: Within Functional Limits for tasks assessed                                     General Comments       Exercises Other Exercises Other Exercises: OT facilitates education with pt and pt's children re importance of OOB activity. Pt and daughters with good reception and pt open to attempting mobilization. Other  Exercises: OT facilitaets education with pt and pt's children re: role of OT in acute setting. Both parties with moderate reception. Would benefit from follow up.   Shoulder Instructions      Home Living Family/patient expects to be discharged to:: Private residence Living Arrangements: Alone Available Help at Discharge: Family;Available 24 hours/day Type of Home: Mobile home Home Access: Ramped entrance     Home Layout: One level     Bathroom Shower/Tub: Teacher, early years/pre: Handicapped height     Home Equipment: Environmental consultant - 2 wheels;Bedside commode;Cane - single point   Additional Comments: Tub transfer bench; two daughters live out of town but will coordinate 24/7 coverage initially upon return home      Prior Functioning/Environment Level of Independence: Independent with assistive device(s)        Comments: Ind amb in the home without an AD, Mod Ind amb with a RW limited community distances, 2LO2/min 24/7 at home, no fall history, Ind with ADLs        OT Problem List: Decreased strength;Decreased range of motion;Decreased activity tolerance;Impaired balance (sitting and/or standing);Decreased knowledge of use of DME or AE;Cardiopulmonary status limiting activity;Increased edema      OT Treatment/Interventions: Self-care/ADL training;Therapeutic exercise;DME and/or AE instruction;Energy conservation;Therapeutic activities;Patient/family education;Balance training    OT Goals(Current goals can be found in the care plan section) Acute Rehab OT Goals Patient  Stated Goal: to get to where I can do more for myself OT Goal Formulation: With patient Time For Goal Achievement: 02/04/20 Potential to Achieve Goals: Good  OT Frequency: Min 2X/week   Barriers to D/C:            Co-evaluation              AM-PAC OT "6 Clicks" Daily Activity     Outcome Measure Help from another person eating meals?: A Little Help from another person taking care of  personal grooming?: A Little Help from another person toileting, which includes using toliet, bedpan, or urinal?: A Lot Help from another person bathing (including washing, rinsing, drying)?: A Lot Help from another person to put on and taking off regular upper body clothing?: A Little Help from another person to put on and taking off regular lower body clothing?: A Lot 6 Click Score: 15   End of Session Equipment Utilized During Treatment: Gait belt;Rolling walker Nurse Communication: Mobility status;Other (comment)(reported to RN re: pt's BP drop)  Activity Tolerance: Patient tolerated treatment well;Other (comment)(somewhat limited d/t feeling "swimmy headed" BP checked with position changes, see mobility section) Patient left: in chair;with call bell/phone within reach;with chair alarm set;with family/visitor present(2 daughters)  OT Visit Diagnosis: Unsteadiness on feet (R26.81);Muscle weakness (generalized) (M62.81)                Time: 0865-7846 OT Time Calculation (min): 68 min Charges:  OT General Charges $OT Visit: 1 Visit OT Evaluation $OT Eval Moderate Complexity: 1 Mod OT Treatments $Self Care/Home Management : 38-52 mins $Therapeutic Activity: 23-37 mins  Gerrianne Scale, MS, OTR/L ascom [phone] 01/21/20, 12:13 PM

## 2020-01-21 NOTE — Progress Notes (Signed)
[facility]  ROUNDING NOTE   Subjective:  Renal parameters improved with dialysis. However also appears to be making more urine as urine output was 900 cc over the preceding 24 hours. Working with physical therapy today.  Objective:  Vital signs in last 24 hours:  Temp:  [97.9 F (36.6 C)-98.8 F (37.1 C)] 98.2 F (36.8 C) (05/12 1126) Pulse Rate:  [45-81] 81 (05/12 1126) Resp:  [13-20] 14 (05/12 0815) BP: (123-146)/(49-63) 136/49 (05/12 1126) SpO2:  [90 %-97 %] 90 % (05/12 1126) Weight:  [87.3 kg] 87.3 kg (05/12 0520)  Weight change: 0.21 kg Filed Weights   01/19/20 2242 01/20/20 0505 01/21/20 0520  Weight: 87.1 kg 87.1 kg 87.3 kg    Intake/Output: I/O last 3 completed shifts: In: 240 [P.O.:240] Out: 800 [Urine:1300]   Intake/Output this shift:  No intake/output data recorded.  Physical Exam: General: No acute distress  Head: Normocephalic, atraumatic. Moist oral mucosal membranes  Eyes: Anicteric  Neck: Supple, trachea midline  Lungs:  Clear to auscultation, normal effort  Heart: S1S2 irregular  Abdomen:  Soft, nontender, bowel sounds present  Extremities: Trace peripheral edema.  Neurologic: Awake, alert, following commands  Skin: No lesions  Access: Right IJ PermCath    Basic Metabolic Panel: Recent Labs  Lab 01/17/20 0723 01/17/20 0723 01/18/20 0604 01/18/20 0604 01/19/20 0707 01/20/20 0611 01/21/20 0431  NA 138  --  141  --  140 144 138  K 3.8  --  3.5  --  3.6 3.6 3.3*  CL 107  --  106  --  103 105 99  CO2 20*  --  24  --  '24 25 30  '$ GLUCOSE 127*  --  130*  --  118* 99 128*  BUN 65*  --  67*  --  69* 51* 26*  CREATININE 5.71*  --  5.75*  --  5.49* 4.00* 2.32*  CALCIUM 7.2*   < > 7.3*   < > 7.0* 7.5* 7.7*  PHOS  --   --   --   --  5.9*  --   --    < > = values in this interval not displayed.    Liver Function Tests: Recent Labs  Lab 01/15/20 1847  AST 15  ALT 15  ALKPHOS 79  BILITOT 0.9  PROT 6.4*  ALBUMIN 3.3*   No  results for input(s): LIPASE, AMYLASE in the last 168 hours. No results for input(s): AMMONIA in the last 168 hours.  CBC: Recent Labs  Lab 01/15/20 1847 01/16/20 0513 01/17/20 0723 01/18/20 0604 01/19/20 0707  WBC 7.7 7.8 7.7 6.5 6.9  NEUTROABS 7.0  --  6.7 5.8 6.0  HGB 9.3* 8.3* 8.4* 8.6* 8.7*  HCT 31.5* 27.7* 28.2* 28.0* 28.8*  MCV 87.3 86.6 87.3 83.6 85.5  PLT 244 203 185 189 187    Cardiac Enzymes: No results for input(s): CKTOTAL, CKMB, CKMBINDEX, TROPONINI in the last 168 hours.  BNP: Invalid input(s): POCBNP  CBG: Recent Labs  Lab 01/20/20 1242 01/20/20 1622 01/20/20 2059 01/21/20 0755 01/21/20 1126  GLUCAP 76 123* 138* 110* 101*    Microbiology: Results for orders placed or performed during the hospital encounter of 01/15/20  Respiratory Panel by RT PCR (Flu A&B, Covid) - Nasopharyngeal Swab     Status: None   Collection Time: 01/15/20  7:38 PM   Specimen: Nasopharyngeal Swab  Result Value Ref Range Status   SARS Coronavirus 2 by RT PCR NEGATIVE NEGATIVE Final    Comment: (  NOTE) SARS-CoV-2 target nucleic acids are NOT DETECTED. The SARS-CoV-2 RNA is generally detectable in upper respiratoy specimens during the acute phase of infection. The lowest concentration of SARS-CoV-2 viral copies this assay can detect is 131 copies/mL. A negative result does not preclude SARS-Cov-2 infection and should not be used as the sole basis for treatment or other patient management decisions. A negative result may occur with  improper specimen collection/handling, submission of specimen other than nasopharyngeal swab, presence of viral mutation(s) within the areas targeted by this assay, and inadequate number of viral copies (<131 copies/mL). A negative result must be combined with clinical observations, patient history, and epidemiological information. The expected result is Negative. Fact Sheet for Patients:  PinkCheek.be Fact Sheet for  Healthcare Providers:  GravelBags.it This test is not yet ap proved or cleared by the [place] FDA and  has been authorized for detection and/or diagnosis of SARS-CoV-2 by FDA under an Emergency Use Authorization (EUA). This EUA will remain  in effect (meaning this test can be used) for the duration of the COVID-19 declaration under Section 564(b)(1) of the Act, 21 U.S.C. section 360bbb-3(b)(1), unless the authorization is terminated or revoked sooner.    Influenza A by PCR NEGATIVE NEGATIVE Final   Influenza B by PCR NEGATIVE NEGATIVE Final    Comment: (NOTE) The Xpert Xpress SARS-CoV-2/FLU/RSV assay is intended as an aid in  the diagnosis of influenza from Nasopharyngeal swab specimens and  should not be used as a sole basis for treatment. Nasal washings and  aspirates are unacceptable for Xpert Xpress SARS-CoV-2/FLU/RSV  testing. Fact Sheet for Patients: PinkCheek.be Fact Sheet for Healthcare Providers: GravelBags.it This test is not yet approved or cleared by the [place] FDA and  has been authorized for detection and/or diagnosis of SARS-CoV-2 by  FDA under an Emergency Use Authorization (EUA). This EUA will remain  in effect (meaning this test can be used) for the duration of the  Covid-19 declaration under Section 564(b)(1) of the Act, 21  U.S.C. section 360bbb-3(b)(1), unless the authorization is  terminated or revoked. Performed at [facility], [address] [place], [place] [address]   Blood Culture (routine x 2)     Status: None   Collection Time: 01/15/20  7:46 PM   Specimen: BLOOD RIGHT WRIST  Result Value Ref Range Status   Specimen Description BLOOD RIGHT WRIST  Final   Special Requests BOTTLES DRAWN AEROBIC AND ANAEROBIC  Final   Culture   Final    NO GROWTH 5 DAYS Performed at [facility], [address] [place], [place] [address]    Report  Status 01/20/2020 FINAL  Final  Blood Culture (routine x 2)     Status: None   Collection Time: 01/16/20  5:26 AM   Specimen: Left Antecubital; Blood  Result Value Ref Range Status   Specimen Description LEFT ANTECUBITAL  Final   Special Requests   Final    BOTTLES DRAWN AEROBIC AND ANAEROBIC Blood Culture adequate volume   Culture   Final    NO GROWTH 5 DAYS Performed at [facility], [address] [place],  [address]    Report Status 01/21/2020 FINAL  Final  MRSA PCR Screening     Status: None   Collection Time: 01/16/20 11:51 PM   Specimen: Nasopharyngeal  Result Value Ref Range Status   MRSA by PCR NEGATIVE NEGATIVE Final    Comment:        The GeneXpert MRSA Assay (FDA approved  for NASAL specimens only), is one component of a comprehensive MRSA colonization surveillance program. It is not intended to diagnose MRSA infection nor to guide or monitor treatment for MRSA infections. Performed at [facility], [address] [place], [place] [address]     Coagulation Studies: No results for input(s): LABPROT, INR in the last 72 hours.  Urinalysis: Recent Labs    01/18/20 1813  COLORURINE YELLOW*  LABSPEC 1.012  PHURINE 5.0  GLUCOSEU NEGATIVE  HGBUR SMALL*  BILIRUBINUR NEGATIVE  KETONESUR NEGATIVE  PROTEINUR 30*  NITRITE NEGATIVE  LEUKOCYTESUR MODERATE*      Imaging: PERIPHERAL VASCULAR CATHETERIZATION  Result Date: 01/19/2020 See op note    Medications:   . sodium chloride    . sodium chloride    . sodium chloride 250 mL (01/20/20 2006)   . apixaban  2.5 mg Oral BID  . atorvastatin  80 mg Oral Daily  . calcium-vitamin D  1 tablet Oral Daily  . Chlorhexidine Gluconate Cloth  6 each Topical Daily  . ciprofloxacin-dexamethasone  4 drop Left EAR BID  . cyanocobalamin  1,000 mcg Intramuscular Q30 days  . doxycycline  100 mg Oral Q12H  . feeding supplement (NEPRO CARB STEADY)  237 mL Oral BID BM  . ferrous sulfate  325 mg  Oral Q lunch  . guaiFENesin  600 mg Oral BID  . heparin injection (subcutaneous)  5,000 Units Subcutaneous Q8H  . insulin aspart  0-9 Units Subcutaneous TID WC  . ipratropium-albuterol  3 mL Nebulization Q6H  . multivitamin  1 tablet Oral QHS  . pantoprazole  40 mg Oral Daily  . sodium chloride flush  10 mL Intravenous Q12H   sodium chloride, sodium chloride, sodium chloride, acetaminophen **OR** acetaminophen, albuterol, alteplase, heparin, lidocaine (PF), lidocaine-prilocaine, ondansetron **OR** ondansetron (ZOFRAN) IV, ondansetron (ZOFRAN) IV, pentafluoroprop-tetrafluoroeth, sodium chloride, traZODone  Assessment/ Plan:  85 y.o. female with atrial fibrillation, congestive heart failure, COPD, diabetes mellitus, hypertension, obstructive sleep apnea, chronic kidney disease stage IIIb baseline EGFR 35 who was admitted with acute kidney injury, multifocal pneumonia.  1.  Acute kidney injury/chronic kidney disease stage IIIb baseline creatinine 1.4/EGFR 35.  Renal ultrasound negative for hydronephrosis but bilateral cysts noted. -Urine output appears to be improving.  Urine output over the preceding 24 hours was 900 cc.  Renal parameters improved with dialysis.  Hold off on further dialysis for now and monitor for renal recovery.  2.  Metabolic acidosis.  Now resolved.  Serum bicarbonate up to 30.  3.  Anemia of chronic kidney disease.  Most recent hemoglobin was 8.7.  Consider use of Epogen as an outpatient.   LOS: 6 Osamah Schmader 5/12/20212:07 PM

## 2020-01-21 NOTE — Plan of Care (Signed)
  Problem: Health Behavior/Discharge Planning: Goal: Ability to manage health-related needs will improve Outcome: Progressing   Problem: Clinical Measurements: Goal: Respiratory complications will improve Outcome: Progressing   Problem: Activity: Goal: Risk for activity intolerance will decrease Outcome: Not Progressing

## 2020-01-22 LAB — GLUCOSE, CAPILLARY
Glucose-Capillary: 129 mg/dL — ABNORMAL HIGH (ref 70–99)
Glucose-Capillary: 122 mg/dL — ABNORMAL HIGH (ref 70–99)
Glucose-Capillary: 168 mg/dL — ABNORMAL HIGH (ref 70–99)
Glucose-Capillary: 135 mg/dL — ABNORMAL HIGH (ref 70–99)

## 2020-01-22 LAB — BASIC METABOLIC PANEL
Anion gap: 8 (ref 5–15)
BUN: 30 mg/dL — ABNORMAL HIGH (ref 8–23)
CO2: 32 mmol/L (ref 22–32)
Calcium: 8.2 mg/dL — ABNORMAL LOW (ref 8.9–10.3)
Chloride: 101 mmol/L (ref 98–111)
Creatinine, Ser: 2.09 mg/dL — ABNORMAL HIGH (ref 0.44–1.00)
GFR calc Af Amer: 25 mL/min — ABNORMAL LOW (ref >60)
GFR calc non Af Amer: 22 mL/min — ABNORMAL LOW (ref >60)
Glucose, Bld: 140 mg/dL — ABNORMAL HIGH (ref 70–99)
Potassium: 3.6 mmol/L (ref 3.5–5.1)
Sodium: 141 mmol/L (ref 135–145)

## 2020-01-22 NOTE — Care Management Important Message (Signed)
Important Message  Patient Details  Name: Dorothy [name] MRN: [id number] Date of Birth: 03/12/1935   Medicare Important Message Given:  Yes     Dannette Barbara 01/22/2020, 1:08 PM

## 2020-01-22 NOTE — Progress Notes (Signed)
Patient ID: Dorothy [name], female   DOB: 18-Nov-1934, 85 y.o.   MRN: [id number] [facility] PROGRESS NOTE  Dorothy [name] XKG:[id number] DOB: 10-19-1934 DOA: 01/15/2020 PCP: Letta Median, MD  HPI/Subjective: Patient does not feel well this morning.  She stated she felt well yesterday afternoon.  Not complaining about difficulty breathing.  Normally wears 2 L of oxygen.  Feels weak.  Urinating well.  Objective: Vitals:   01/22/20 1155 01/22/20 1159  BP: 134/62   Pulse: 78 71  Resp:    Temp: 98.3 F (36.8 C)   SpO2: 90% 92%    Intake/Output Summary (Last 24 hours) at 01/22/2020 1314 Last data filed at 01/22/2020 0353 Gross per 24 hour  Intake 91.15 ml  Output 700 ml  Net -608.85 ml   Filed Weights   01/20/20 0505 01/21/20 0520 01/22/20 0352  Weight: 87.1 kg 87.3 kg 84.9 kg    ROS: Review of Systems  Constitutional: Positive for malaise/fatigue. Negative for fever.  Eyes: Negative for blurred vision.  Respiratory: Negative for cough and shortness of breath.   Cardiovascular: Negative for chest pain.  Gastrointestinal: Negative for abdominal pain, nausea and vomiting.  Genitourinary: Negative for dysuria.  Musculoskeletal: Negative for joint pain.  Neurological: Negative for dizziness.   Exam: Physical Exam  Constitutional: She is oriented to person, place, and time.  HENT:  Nose: No mucosal edema.  Mouth/Throat: No oropharyngeal exudate or posterior oropharyngeal edema.  Eyes: Conjunctivae and lids are normal.  Neck: Carotid bruit is not present.  Cardiovascular: S1 normal and S2 normal. Exam reveals no gallop.  No murmur heard. Respiratory: No respiratory distress. She has decreased breath sounds in the right lower field and the left lower field. She has no wheezes. She has rhonchi in the right lower field and the left lower field. She has no rales.  GI: Soft. Bowel sounds are normal. There is no abdominal tenderness.  Musculoskeletal:     Right ankle:  No swelling.     Left ankle: No swelling.  Lymphadenopathy:    She has no cervical adenopathy.  Neurological: She is alert and oriented to person, place, and time.  Skin: Skin is warm. No rash noted. Nails show no clubbing.  Psychiatric: She has a normal mood and affect.      Data Reviewed: Basic Metabolic Panel: Recent Labs  Lab 01/18/20 0604 01/19/20 0707 01/20/20 0611 01/21/20 0431 01/22/20 0422  NA 141 140 144 138 141  K 3.5 3.6 3.6 3.3* 3.6  CL 106 103 105 99 101  CO2 24 24 25 30  32  GLUCOSE 130* 118* 99 128* 140*  BUN 67* 69* 51* 26* 30*  CREATININE 5.75* 5.49* 4.00* 2.32* 2.09*  CALCIUM 7.3* 7.0* 7.5* 7.7* 8.2*  PHOS  --  5.9*  --   --   --    Liver Function Tests: Recent Labs  Lab 01/15/20 1847  AST 15  ALT 15  ALKPHOS 79  BILITOT 0.9  PROT 6.4*  ALBUMIN 3.3*   CBC: Recent Labs  Lab 01/15/20 1847 01/16/20 0513 01/17/20 0723 01/18/20 0604 01/19/20 0707  WBC 7.7 7.8 7.7 6.5 6.9  NEUTROABS 7.0  --  6.7 5.8 6.0  HGB 9.3* 8.3* 8.4* 8.6* 8.7*  HCT 31.5* 27.7* 28.2* 28.0* 28.8*  MCV 87.3 86.6 87.3 83.6 85.5  PLT 244 203 185 189 187   BNP (last 3 results) Recent Labs    10/05/19 1332 01/15/20 1847 01/17/20 0723  BNP 248.0* 256.0* 308.0*  CBG: Recent Labs  Lab 01/21/20 1126 01/21/20 1625 01/21/20 2105 01/22/20 0809 01/22/20 1156  GLUCAP 101* 186* 172* 129* 122*    Recent Results (from the past 240 hour(s))  Respiratory Panel by RT PCR (Flu A&B, Covid) - Nasopharyngeal Swab     Status: None   Collection Time: 01/15/20  7:38 PM   Specimen: Nasopharyngeal Swab  Result Value Ref Range Status   SARS Coronavirus 2 by RT PCR NEGATIVE NEGATIVE Final    Comment: (NOTE) SARS-CoV-2 target nucleic acids are NOT DETECTED. The SARS-CoV-2 RNA is generally detectable in upper respiratoy specimens during the acute phase of infection. The lowest concentration of SARS-CoV-2 viral copies this assay can detect is 131 copies/mL. A negative result does  not preclude SARS-Cov-2 infection and should not be used as the sole basis for treatment or other patient management decisions. A negative result may occur with  improper specimen collection/handling, submission of specimen other than nasopharyngeal swab, presence of viral mutation(s) within the areas targeted by this assay, and inadequate number of viral copies (<131 copies/mL). A negative result must be combined with clinical observations, patient history, and epidemiological information. The expected result is Negative. Fact Sheet for Patients:  PinkCheek.be Fact Sheet for Healthcare Providers:  GravelBags.it This test is not yet ap proved or cleared by the [place] FDA and  has been authorized for detection and/or diagnosis of SARS-CoV-2 by FDA under an Emergency Use Authorization (EUA). This EUA will remain  in effect (meaning this test can be used) for the duration of the COVID-19 declaration under Section 564(b)(1) of the Act, 21 U.S.C. section 360bbb-3(b)(1), unless the authorization is terminated or revoked sooner.    Influenza A by PCR NEGATIVE NEGATIVE Final   Influenza B by PCR NEGATIVE NEGATIVE Final    Comment: (NOTE) The Xpert Xpress SARS-CoV-2/FLU/RSV assay is intended as an aid in  the diagnosis of influenza from Nasopharyngeal swab specimens and  should not be used as a sole basis for treatment. Nasal washings and  aspirates are unacceptable for Xpert Xpress SARS-CoV-2/FLU/RSV  testing. Fact Sheet for Patients: PinkCheek.be Fact Sheet for Healthcare Providers: GravelBags.it This test is not yet approved or cleared by the [place] FDA and  has been authorized for detection and/or diagnosis of SARS-CoV-2 by  FDA under an Emergency Use Authorization (EUA). This EUA will remain  in effect (meaning this test can be used) for the duration of the   Covid-19 declaration under Section 564(b)(1) of the Act, 21  U.S.C. section 360bbb-3(b)(1), unless the authorization is  terminated or revoked. Performed at [facility], [address] [place], [place] [address]   Blood Culture (routine x 2)     Status: None   Collection Time: 01/15/20  7:46 PM   Specimen: BLOOD RIGHT WRIST  Result Value Ref Range Status   Specimen Description BLOOD RIGHT WRIST  Final   Special Requests BOTTLES DRAWN AEROBIC AND ANAEROBIC  Final   Culture   Final    NO GROWTH 5 DAYS Performed at [facility], [address] [place],  [address]    Report Status 01/20/2020 FINAL  Final  Blood Culture (routine x 2)     Status: None   Collection Time: 01/16/20  5:26 AM   Specimen: Left Antecubital; Blood  Result Value Ref Range Status   Specimen Description LEFT ANTECUBITAL  Final   Special Requests   Final    BOTTLES DRAWN AEROBIC AND ANAEROBIC Blood Culture adequate volume  Culture   Final    NO GROWTH 5 DAYS Performed at [facility], [address] [place], [place] [address]    Report Status 01/21/2020 FINAL  Final  MRSA PCR Screening     Status: None   Collection Time: 01/16/20 11:51 PM   Specimen: Nasopharyngeal  Result Value Ref Range Status   MRSA by PCR NEGATIVE NEGATIVE Final    Comment:        The GeneXpert MRSA Assay (FDA approved for NASAL specimens only), is one component of a comprehensive MRSA colonization surveillance program. It is not intended to diagnose MRSA infection nor to guide or monitor treatment for MRSA infections. Performed at [facility], [address] [place], [place] [address]       Scheduled Meds: . apixaban  2.5 mg Oral BID  . atorvastatin  80 mg Oral Daily  . calcium-vitamin D  1 tablet Oral Daily  . Chlorhexidine Gluconate Cloth  6 each Topical Daily  . ciprofloxacin-dexamethasone  4 drop Left EAR BID  . cyanocobalamin  1,000 mcg Intramuscular Q30 days  .  feeding supplement (NEPRO CARB STEADY)  237 mL Oral BID BM  . ferrous sulfate  325 mg Oral Q lunch  . guaiFENesin  600 mg Oral BID  . insulin aspart  0-9 Units Subcutaneous TID WC  . ipratropium-albuterol  3 mL Nebulization Q6H  . multivitamin  1 tablet Oral QHS  . pantoprazole  40 mg Oral Daily  . sodium chloride flush  10 mL Intravenous Q12H   Continuous Infusions: . sodium chloride    . sodium chloride    . sodium chloride 250 mL (01/20/20 2006)    Assessment/Plan:  1. Acute on chronic hypoxic respiratory failure.  The patient was again on 6 L of oxygen this morning and dialed down to 4 L of oxygen.  She chronically wears 2 L of oxygen.  Continue incentive spirometer.  Continue working with physical therapy. 2. Sepsis with multifocal pneumonia, present on admission.  Initially started on Rocephin and Zithromax and has now finished up course of antibiotics finishing up doxycycline today. 3. Acute kidney injury superimposed on chronic kidney disease stage IIIb.  The patient had a PermCath placed and had 2 dialysis sessions.  Patient now making urine and nephrology is waiting to see if we can continue managing things off of dialysis.  Check creatinine again tomorrow. 4. Chronic atrial fibrillation.  Patient also had bradycardia with pauses.  Restarted Eliquis last evening.  Clonidine discontinued.  Cardiology does not recommend any further testing. 5. Weakness.  Physical therapy recommending rehab.       Code Status:     Code Status Orders  (From admission, onward)         Start     Ordered   01/16/20 1443  Do not attempt resuscitation (DNR)  Continuous    Question Answer Comment  In the event of cardiac or respiratory ARREST Do not call a "code blue"   In the event of cardiac or respiratory ARREST Do not perform Intubation, CPR, defibrillation or ACLS   In the event of cardiac or respiratory ARREST Use medication by any route, position, wound care, and other measures to  relive pain and suffering. May use oxygen, suction and manual treatment of airway obstruction as needed for comfort.      01/16/20 1442        Code Status History    Date Active Date Inactive Code Status Order ID Comments User  Context   01/15/2020 2045 01/16/2020 1442 Full Code [id number]  Sidney Ace Arvella Merles, MD ED   10/06/2019 1214 10/12/2019 0006 Full Code [id number]  Enzo Bi, MD Inpatient   12/19/2016 0023 12/22/2016 1915 Full Code [id number]  Lance Coon, MD Inpatient   01/09/2015 1308 01/10/2015 1949 Full Code [id number]  Leonard Schwartz, RN Inpatient   Advance Care Planning Activity     Family Communication: Spoke with daughters at the bedside Disposition Plan: Status is: Inpatient  Dispo: The patient is from: Home              Anticipated d/c is to: Rehab              Anticipated d/c date is dependent on nephrology's assessment on whether she needs dialysis or can be watched off dialysis.  Potentially 01/26/2020.  Earlier if nephrology clears to go prior.              Patient currently requires inpatient monitoring for kidney function to see if she needs to go back on dialysis or whether can be followed off dialysis.  Once nephrology clears me to get her out to rehab I will be able to do so.  Would also like to get back to her baseline oxygen of 2 L prior to disposition.   Consultants:  Nephrology  Cardiology  Antibiotics:  Completed  Time spent: 28 minutes  [place]

## 2020-01-22 NOTE — Progress Notes (Signed)
Physical Therapy Treatment Patient Details Name: Dorothy [name] MRN: [id number] DOB: 13-Aug-1935 Today's Date: 01/22/2020    History of Present Illness Per MD notes: Pt is an 85 yo female with a known history of CHF, COPD, type 2 diabetes mellitus, GERD, dyslipidemia, hypertension and obstructive sleep apnea, who presented to the emergency room with acute onset of generalized weakness with associated dyspnea and diminished p.o. intake.  Pt with surgical placement of RIJ permcath 01/19/20 with HD initiated 01/20/20.  MD assessment includes: Multifocal pneumonia, Acute on chronic hypoxic respiratory failure, sepsis, A-fib, AKI on CKD IIIb, metabolic acidosis, HTN, and DM II.    PT Comments    Attempted earlier this am after breathing treatment.  Sats 87% on room air 5 lpm.  Called RN and ok to increase to 6lpm and requested I return later for session. Returned later this am.  4 lpm at rest.  sats 88--90% at rest.  Family in stating she had a good day yesterday and was able to sit for about 10 hours in recliner but stated she seemed more fatigued today.  Pt agrees to session.  Monitored sats during session with rest breaks given during supine ex as below.  Education for proper breathing to maintain sats with ex.  Towards end of session she seemed more fatigued and having some increased difficulty recovering with 10 rep sets.  86% with slow recovery.  Increased briefly to 5 lpm to return to baseline for about 1 minute with good recovery and returned to 4 lpm.  Remained in bed after session due to general fatigue.   Follow Up Recommendations  SNF;Supervision for mobility/OOB     Equipment Recommendations  None recommended by PT    Recommendations for Other Services       Precautions / Restrictions Precautions Precautions: Fall Restrictions Weight Bearing Restrictions: No    Mobility  Bed Mobility               General bed mobility comments: deferred due to sats not maintaining with  supine ex.  Transfers                    Ambulation/Gait                 Stairs             Wheelchair Mobility    Modified Rankin (Stroke Patients Only)       Balance                                            Cognition Arousal/Alertness: Awake/alert Behavior During Therapy: WFL for tasks assessed/performed Overall Cognitive Status: Within Functional Limits for tasks assessed                                        Exercises Other Exercises Other Exercises: BLE A/AAROM x 10  - ankle pumps, heel slides, ab/dd and slr.    General Comments        Pertinent Vitals/Pain Pain Assessment: No/denies pain    Home Living                      Prior Function            PT Goals (current goals can  now be found in the care plan section) Progress towards PT goals: Progressing toward goals    Frequency    Min 2X/week      PT Plan Current plan remains appropriate    Co-evaluation              AM-PAC PT "6 Clicks" Mobility   Outcome Measure  Help needed turning from your back to your side while in a flat bed without using bedrails?: A Lot Help needed moving from lying on your back to sitting on the side of a flat bed without using bedrails?: A Lot Help needed moving to and from a bed to a chair (including a wheelchair)?: A Lot Help needed standing up from a chair using your arms (e.g., wheelchair or bedside chair)?: A Lot Help needed to walk in hospital room?: Total Help needed climbing 3-5 steps with a railing? : Total 6 Click Score: 10    End of Session Equipment Utilized During Treatment: Oxygen Activity Tolerance: Treatment limited secondary to medical complications (Comment) Patient left: in bed;with call bell/phone within reach;with bed alarm set;with family/visitor present Nurse Communication: Mobility status;Other (comment)(O2 sats)       Time: 1037-1100 PT Time Calculation  (min) (ACUTE ONLY): 23 min  Charges:  $Therapeutic Exercise: 8-22 mins $Therapeutic Activity: 8-22 mins                    Chesley Noon, PTA 01/22/20, 11:15 AM

## 2020-01-22 NOTE — Progress Notes (Signed)
Dorothy [name]  MRN: [id number]  DOB/AGE: [birth year] 83 y.o.  Primary Care Physician:Bender, Durene Cal, MD  Admit date: 01/15/2020  Chief Complaint:  Chief Complaint  Patient presents with  . Weakness    S-Pt presented on  01/15/2020 with  Chief Complaint  Patient presents with  . Weakness  .    Pt today feels better   Medications   . apixaban  2.5 mg Oral BID  . atorvastatin  80 mg Oral Daily  . calcium-vitamin D  1 tablet Oral Daily  . Chlorhexidine Gluconate Cloth  6 each Topical Daily  . ciprofloxacin-dexamethasone  4 drop Left EAR BID  . cyanocobalamin  1,000 mcg Intramuscular Q30 days  . feeding supplement (NEPRO CARB STEADY)  237 mL Oral BID BM  . ferrous sulfate  325 mg Oral Q lunch  . guaiFENesin  600 mg Oral BID  . insulin aspart  0-9 Units Subcutaneous TID WC  . ipratropium-albuterol  3 mL Nebulization Q6H  . multivitamin  1 tablet Oral QHS  . pantoprazole  40 mg Oral Daily  . sodium chloride flush  10 mL Intravenous Q12H         EPP:IRJJO from the symptoms mentioned above,there are no other symptoms referable to all systems reviewed.  Physical Exam: Vital signs in last 24 hours: Temp:  [98.3 F (36.8 C)-98.6 F (37 C)] 98.3 F (36.8 C) (05/13 1155) Pulse Rate:  [63-78] 70 (05/13 1356) Resp:  [16-20] 16 (05/13 1356) BP: (117-147)/(62-70) 134/62 (05/13 1155) SpO2:  [89 %-96 %] 92 % (05/13 1356) Weight:  [84.9 kg] 84.9 kg (05/13 0352) Weight change: -2.387 kg Last BM Date: (unknown)  Intake/Output from previous day: 05/12 0701 - 05/13 0700 In: 91.2 [I.V.:91.2] Out: 700 [Urine:700] Total I/O In: 240 [P.O.:240] Out: -    Physical Exam: General- pt is awake,alert, oriented to time place and person Resp- No acute REsp distress, CTA B/L NO Rhonchi CVS- S1S2 regular in rate and rhythm GIT- BS+, soft, NT, ND EXT- NO LE Edema, Cyanosis   Lab Results: CBC No results for input(s): WBC, HGB, HCT, PLT in the last 72 hours.   Patient last  hemoglobin was 8.7 on May 10  BMET Recent Labs    01/21/20 0431 01/22/20 0422  NA 138 141  K 3.3* 3.6  CL 99 101  CO2 30 32  GLUCOSE 128* 140*  BUN 26* 30*  CREATININE 2.32* 2.09*  CALCIUM 7.7* 8.2*    Creatinine trend 2021  5.5==>2.3==> 2.0 1.2--1.4 baseline  2018 1.2 2016 1.0--1.3  MICRO Recent Results (from the past 240 hour(s))  Respiratory Panel by RT PCR (Flu A&B, Covid) - Nasopharyngeal Swab     Status: None   Collection Time: 01/15/20  7:38 PM   Specimen: Nasopharyngeal Swab  Result Value Ref Range Status   SARS Coronavirus 2 by RT PCR NEGATIVE NEGATIVE Final    Comment: (NOTE) SARS-CoV-2 target nucleic acids are NOT DETECTED. The SARS-CoV-2 RNA is generally detectable in upper respiratoy specimens during the acute phase of infection. The lowest concentration of SARS-CoV-2 viral copies this assay can detect is 131 copies/mL. A negative result does not preclude SARS-Cov-2 infection and should not be used as the sole basis for treatment or other patient management decisions. A negative result may occur with  improper specimen collection/handling, submission of specimen other than nasopharyngeal swab, presence of viral mutation(s) within the areas targeted by this assay, and inadequate number of viral copies (<131 copies/mL). A negative result must  be combined with clinical observations, patient history, and epidemiological information. The expected result is Negative. Fact Sheet for Patients:  PinkCheek.be Fact Sheet for Healthcare Providers:  GravelBags.it This test is not yet ap proved or cleared by the [place] FDA and  has been authorized for detection and/or diagnosis of SARS-CoV-2 by FDA under an Emergency Use Authorization (EUA). This EUA will remain  in effect (meaning this test can be used) for the duration of the COVID-19 declaration under Section 564(b)(1) of the Act, 21 U.S.C. section  360bbb-3(b)(1), unless the authorization is terminated or revoked sooner.    Influenza A by PCR NEGATIVE NEGATIVE Final   Influenza B by PCR NEGATIVE NEGATIVE Final    Comment: (NOTE) The Xpert Xpress SARS-CoV-2/FLU/RSV assay is intended as an aid in  the diagnosis of influenza from Nasopharyngeal swab specimens and  should not be used as a sole basis for treatment. Nasal washings and  aspirates are unacceptable for Xpert Xpress SARS-CoV-2/FLU/RSV  testing. Fact Sheet for Patients: PinkCheek.be Fact Sheet for Healthcare Providers: GravelBags.it This test is not yet approved or cleared by the [place] FDA and  has been authorized for detection and/or diagnosis of SARS-CoV-2 by  FDA under an Emergency Use Authorization (EUA). This EUA will remain  in effect (meaning this test can be used) for the duration of the  Covid-19 declaration under Section 564(b)(1) of the Act, 21  U.S.C. section 360bbb-3(b)(1), unless the authorization is  terminated or revoked. Performed at [facility], [address] [place], [place] [address]   Blood Culture (routine x 2)     Status: None   Collection Time: 01/15/20  7:46 PM   Specimen: BLOOD RIGHT WRIST  Result Value Ref Range Status   Specimen Description BLOOD RIGHT WRIST  Final   Special Requests BOTTLES DRAWN AEROBIC AND ANAEROBIC  Final   Culture   Final    NO GROWTH 5 DAYS Performed at [facility], [address] [place], [place] [address]    Report Status 01/20/2020 FINAL  Final  Blood Culture (routine x 2)     Status: None   Collection Time: 01/16/20  5:26 AM   Specimen: Left Antecubital; Blood  Result Value Ref Range Status   Specimen Description LEFT ANTECUBITAL  Final   Special Requests   Final    BOTTLES DRAWN AEROBIC AND ANAEROBIC Blood Culture adequate volume   Culture   Final    NO GROWTH 5 DAYS Performed at [facility], [address] [place], [place] [address]    Report Status 01/21/2020 FINAL  Final  MRSA PCR Screening     Status: None   Collection Time: 01/16/20 11:51 PM   Specimen: Nasopharyngeal  Result Value Ref Range Status   MRSA by PCR NEGATIVE NEGATIVE Final    Comment:        The GeneXpert MRSA Assay (FDA approved for NASAL specimens only), is one component of a comprehensive MRSA colonization surveillance program. It is not intended to diagnose MRSA infection nor to guide or monitor treatment for MRSA infections. Performed at [facility], [address] [place], [place] [address]       Lab Results  Component Value Date   PTH 159 (H) 01/19/2020   CALCIUM 8.2 (L) 01/22/2020   PHOS 5.9 (H) 01/19/2020    Albumin 3.3 Corrected calcium 8.2+0.4 is equal to 8.6           Impression:    Patient is 85 year old  female with a past medical history of atrial fibrillation, CHF, COPD, diabetes mellitus, hypertension, obstructive sleep apnea, CKD stage IIIb who was admitted to the hospital with chief complaint of acute kidney injury and pneumonia  1)Renal  AKI secondary to ATN Patient had AKI on CKD Patient has CKD stage IIIb Patient has CKD stage III going back to 2017. Patient has CKD most likely secondary to diabetes mellitus. There is possible contribution from age associated decline/hypertension as well. Patient required renal replacement therapy. Patient urine output is now better Patient is currently not being dialyzed  Creatinine is trending down/urine output is better   2)HTN Blood pressure stable   3)Anemia of chronic disease  HGb at goal (9--11)   4) secondary hyperparathyroidism -CKD Mineral-Bone Disorder   Secondary Hyperparathyroidism present . Phosphorus is not at goal. We will hold off on starting binders for now  Calcium when corrected for low albumin is at goal  5) pneumonia Patient is now clinically better  6) electrolytes    sodium Normonatremic   potassium Normokalemic    7)Acid base Co2 at goal     Plan:   We will continue current treatment We will continue to hold off on renal placement therapy. We will follow Chem-7/CBC We will decide tomorrow if patient can take out patient catheter    Caryl Fate s [facility] 01/22/2020, 4:50 PM

## 2020-01-22 NOTE — NC FL2 (Signed)
[place] LEVEL OF CARE SCREENING TOOL     IDENTIFICATION  Patient Name: Dorothy [name] Birthdate: 1934-12-16 Sex: female Admission Date (Current Location): 01/15/2020  [place] and [place] Number:  Engineering geologist and Address:  [facility], [address], [place], [place] [address]      Provider Number: [id number]  Attending Physician Name and Address:  Loletha Grayer, MD  Relative Name and Phone Number:  [name]  [phone]    Current Level of Care: Hospital Recommended Level of Care: [place] Prior Approval Number:    Date Approved/Denied:   PASRR Number: [id number]  Discharge Plan: SNF    Current Diagnoses: Patient Active Problem List   Diagnosis Date Noted  . Acute kidney injury superimposed on CKD ([place])   . Goals of care, counseling/discussion   . Palliative care by specialist   . Encounter for hospice care discussion   . Acute on chronic respiratory failure with hypoxia ([place])   . Weakness   . Multifocal pneumonia 10/05/2019  . Acute hypoxemic respiratory failure ([place]) 10/05/2019  . Diabetes ([place]) 05/04/2017  . Iron deficiency anemia 12/28/2016  . Chronic diastolic heart failure ([place]) 12/26/2016  . HTN (hypertension) 12/26/2016  . Atrial fibrillation, chronic ([place]) 12/26/2016  . B12 deficiency   . Sepsis ([place]) 12/18/2016  . CAP (community acquired pneumonia) 12/18/2016  . Anemia 12/18/2016    Orientation RESPIRATION BLADDER Height & Weight     Self, Situation, Time, Place  Normal External catheter Weight: 84.9 kg Height:  5\' 4"  (162.6 cm)  BEHAVIORAL SYMPTOMS/MOOD NEUROLOGICAL BOWEL NUTRITION STATUS      Continent Diet(Renal Car Modified, 1229ml fluid restriction)  AMBULATORY STATUS COMMUNICATION OF NEEDS Skin   Limited Assist Verbally Normal                       Personal Care Assistance Level of Assistance  Bathing, Dressing, Feeding Bathing Assistance: Limited  assistance Feeding assistance: Independent Dressing Assistance: Limited assistance     Functional Limitations Info  Sight, Hearing, Speech Sight Info: Adequate Hearing Info: Adequate Speech Info: Adequate    SPECIAL CARE FACTORS FREQUENCY  PT (By licensed PT), OT (By licensed OT)     PT Frequency: 7x weekly OT Frequency: 7x weekly            Contractures Contractures Info: Not present    Additional Factors Info  Code Status, Allergies(HD) Code Status Info: DNR Allergies Info: Tramadol and Aspririn           Current Medications (01/22/2020):  This is the current hospital active medication list Current Facility-Administered Medications  Medication Dose Route Frequency Provider Last Rate Last Admin  . 0.9 %  sodium chloride infusion  100 mL Intravenous PRN Algernon Huxley, MD      . 0.9 %  sodium chloride infusion  100 mL Intravenous PRN Algernon Huxley, MD      . 0.9 %  sodium chloride infusion   Intravenous PRN Ralene Muskrat B, MD 10 mL/hr at 01/20/20 2006 250 mL at 01/20/20 2006  . acetaminophen (TYLENOL) tablet 650 mg  650 mg Oral Q6H PRN Algernon Huxley, MD   650 mg at 01/18/20 1115   Or  . acetaminophen (TYLENOL) suppository 650 mg  650 mg Rectal Q6H PRN Algernon Huxley, MD      . albuterol (PROVENTIL) (2.5 MG/3ML) 0.083% nebulizer solution 2.5 mg  2.5 mg Inhalation Q6H PRN Algernon Huxley,  MD   2.5 mg at 01/18/20 1120  . alteplase (CATHFLO ACTIVASE) injection 2 mg  2 mg Intracatheter Once PRN Algernon Huxley, MD      . apixaban Arne Cleveland) tablet 2.5 mg  2.5 mg Oral BID Loletha Grayer, MD   2.5 mg at 01/22/20 0817  . atorvastatin (LIPITOR) tablet 80 mg  80 mg Oral Daily Algernon Huxley, MD   80 mg at 01/21/20 1634  . calcium-vitamin D (OSCAL WITH D) 500-200 MG-UNIT per tablet 1 tablet  1 tablet Oral Daily Algernon Huxley, MD   1 tablet at 01/22/20 0817  . Chlorhexidine Gluconate Cloth 2 % PADS 6 each  6 each Topical Daily Algernon Huxley, MD   6 each at 01/22/20 0818  .  ciprofloxacin-dexamethasone (CIPRODEX) 0.3-0.1 % OTIC (EAR) suspension 4 drop  4 drop Left EAR BID Algernon Huxley, MD   4 drop at 01/22/20 0817  . cyanocobalamin ((VITAMIN B-12)) injection 1,000 mcg  1,000 mcg Intramuscular Q30 days Algernon Huxley, MD   1,000 mcg at 01/19/20 1023  . feeding supplement (NEPRO CARB STEADY) liquid 237 mL  237 mL Oral BID BM Ralene Muskrat B, MD   237 mL at 01/22/20 0818  . ferrous sulfate tablet 325 mg  325 mg Oral Q lunch Algernon Huxley, MD   325 mg at 01/21/20 1153  . guaiFENesin (MUCINEX) 12 hr tablet 600 mg  600 mg Oral BID Algernon Huxley, MD   600 mg at 01/22/20 0817  . heparin injection 1,000 Units  1,000 Units Dialysis PRN Algernon Huxley, MD      . insulin aspart (novoLOG) injection 0-9 Units  0-9 Units Subcutaneous TID WC Algernon Huxley, MD   1 Units at 01/22/20 0817  . ipratropium-albuterol (DUONEB) 0.5-2.5 (3) MG/3ML nebulizer solution 3 mL  3 mL Nebulization Q6H Algernon Huxley, MD   3 mL at 01/22/20 0841  . lidocaine (PF) (XYLOCAINE) 1 % injection 5 mL  5 mL Intradermal PRN Algernon Huxley, MD      . lidocaine-prilocaine (EMLA) cream 1 application  1 application Topical PRN Algernon Huxley, MD      . multivitamin (RENA-VIT) tablet 1 tablet  1 tablet Oral QHS Ralene Muskrat B, MD   1 tablet at 01/21/20 2207  . ondansetron (ZOFRAN) tablet 4 mg  4 mg Oral Q6H PRN Algernon Huxley, MD       Or  . ondansetron [facility]) injection 4 mg  4 mg Intravenous Q6H PRN Algernon Huxley, MD   4 mg at 01/19/20 0809  . ondansetron (ZOFRAN) injection 4 mg  4 mg Intravenous Q6H PRN Algernon Huxley, MD      . pantoprazole (PROTONIX) EC tablet 40 mg  40 mg Oral Daily Algernon Huxley, MD   40 mg at 01/22/20 0817  . pentafluoroprop-tetrafluoroeth (GEBAUERS) aerosol 1 application  1 application Topical PRN Dew, Erskine Squibb, MD      . sodium chloride (OCEAN) 0.65 % nasal spray 1 spray  1 spray Each Nare PRN Algernon Huxley, MD      . sodium chloride flush (NS) 0.9 % injection 10 mL  10 mL Intravenous Q12H Ralene Muskrat B, MD   10 mL at 01/22/20 0818  . traZODone (DESYREL) tablet 25 mg  25 mg Oral QHS PRN Algernon Huxley, MD   25 mg at 01/18/20 2150     Discharge Medications: Please see discharge summary for a list  of discharge medications.  Relevant Imaging Results:  Relevant Lab Results:   Additional Information ss: [id number], Hemodialysis (not long term), Iron infusions every 2 weeks  Victorino Dike, RN

## 2020-01-23 ENCOUNTER — Inpatient Hospital Stay: Payer: Medicare Other

## 2020-01-23 DIAGNOSIS — D696 Thrombocytopenia, unspecified: Secondary | ICD-10-CM

## 2020-01-23 LAB — BASIC METABOLIC PANEL WITH GFR
Anion gap: 8 (ref 5–15)
BUN: 29 mg/dL — ABNORMAL HIGH (ref 8–23)
CO2: 30 mmol/L (ref 22–32)
Calcium: 8.4 mg/dL — ABNORMAL LOW (ref 8.9–10.3)
Chloride: 102 mmol/L (ref 98–111)
Creatinine, Ser: 1.88 mg/dL — ABNORMAL HIGH (ref 0.44–1.00)
GFR calc Af Amer: 28 mL/min — ABNORMAL LOW
GFR calc non Af Amer: 24 mL/min — ABNORMAL LOW
Glucose, Bld: 137 mg/dL — ABNORMAL HIGH (ref 70–99)
Potassium: 4.1 mmol/L (ref 3.5–5.1)
Sodium: 140 mmol/L (ref 135–145)

## 2020-01-23 LAB — CBC
HCT: 26.8 % — ABNORMAL LOW (ref 36.0–46.0)
Hemoglobin: 8.2 g/dL — ABNORMAL LOW (ref 12.0–15.0)
MCH: 26 pg (ref 26.0–34.0)
MCHC: 30.6 g/dL (ref 30.0–36.0)
MCV: 85.1 fL (ref 80.0–100.0)
Platelets: 106 10*3/uL — ABNORMAL LOW (ref 150–400)
RBC: 3.15 MIL/uL — ABNORMAL LOW (ref 3.87–5.11)
RDW: 15.8 % — ABNORMAL HIGH (ref 11.5–15.5)
WBC: 5 10*3/uL (ref 4.0–10.5)
nRBC: 0 % (ref 0.0–0.2)

## 2020-01-23 LAB — SARS CORONAVIRUS 2 BY RT PCR (HOSPITAL ORDER, PERFORMED IN ~~LOC~~ HOSPITAL LAB): SARS Coronavirus 2: NEGATIVE

## 2020-01-23 LAB — GLUCOSE, CAPILLARY
Glucose-Capillary: 122 mg/dL — ABNORMAL HIGH (ref 70–99)
Glucose-Capillary: 159 mg/dL — ABNORMAL HIGH (ref 70–99)

## 2020-01-23 MED ORDER — NEPRO/CARBSTEADY PO LIQD: 237.0000 mL | Freq: Two times a day (BID) | ORAL | 0 refills | Status: DC

## 2020-01-23 MED ORDER — SALINE SPRAY 0.65 % NA SOLN: 1.0000 | NASAL | 0 refills | Status: AC | PRN

## 2020-01-23 MED ORDER — RENA-VITE PO TABS: 1.0000 | ORAL_TABLET | Freq: Every day | ORAL | 0 refills | Status: AC

## 2020-01-23 MED ORDER — CIPROFLOXACIN-DEXAMETHASONE 0.3-0.1 % OT SUSP: 4.0000 [drp] | Freq: Two times a day (BID) | OTIC | 0 refills | Status: AC

## 2020-01-23 NOTE — Discharge Summary (Signed)
[place] at [place] NAME: Dorothy [name]    MR#:  [id number]  DATE OF BIRTH:  [birth year]  DATE OF ADMISSION:  01/15/2020 ADMITTING PHYSICIAN: Christel Mormon, MD  DATE OF DISCHARGE: 01/22/2013  PRIMARY CARE PHYSICIAN: Letta Median, MD    ADMISSION DIAGNOSIS:  Acute renal failure, unspecified acute renal failure type ([place]) [N17.9] Multifocal pneumonia [J18.9]  DISCHARGE DIAGNOSIS:  Active Problems:   Atrial fibrillation, chronic (HCC)   Multifocal pneumonia   Acute kidney injury superimposed on CKD ([place])   Goals of care, counseling/discussion   Palliative care by specialist   Encounter for hospice care discussion   Acute on chronic respiratory failure with hypoxia ([place])   Weakness   SECONDARY DIAGNOSIS:   Past Medical History:  Diagnosis Date  . Anemia   . Arthritis    hands, knees, back  . Atrial fibrillation ([place])   . CHF (congestive heart failure) ([place])   . COPD (chronic obstructive pulmonary disease) ([place])   . Diabetes mellitus without complication ([place])   . Dyspnea   . GERD (gastroesophageal reflux disease)   . Hyperlipidemia   . Hypertension   . PONV (postoperative nausea and vomiting)   . Sleep apnea    told she needs CPAP, does not have  . Wears dentures    full upper and lower.  only wears upper    HOSPITAL COURSE:   1.  Acute on chronic hypoxic respiratory failure.  The patient chronically wears 2 L of oxygen.  The other day was on 6 L of oxygen and tapered down to 4.  I tapered her down to 3 L of oxygen today.  If pulse ox remains good can taper down to her usual 2 L of oxygen. 2.  Sepsis with multifocal pneumonia.  This was present on admission.  Patient initially started on Rocephin and Zithromax.  Patient was switched over to doxycycline.  And finished up antibiotics in the hospital. 3.  Acute kidney injury superimposed on chronic kidney disease stage IIIb.  The patient had a PermCath placed and 2 dialysis  sessions in the hospital.  The patient is now making urine and creatinine improving on its own.  Nephrology will continue to manage as outpatient holding off on dialysis at this time.  Check a BMP on a weekly basis with results to Dr. Holley Raring nephrology. 4.  Chronic atrial fibrillation.  The patient also had bradycardia and pauses.  The patient was placed back on Eliquis.  Clonidine was discontinued.  Cardiology does not recommend any further testing or procedures. 5.  Weakness.  Physical therapy recommending rehab 6.  Anemia, iron deficiency continue ferrous sulfate.  Last hemoglobin 8.2 7.  Thrombocytopenia.  Platelet count a little lower than previous.  Patient was on heparin subcutaneous injections while holding Eliquis for the PermCath.  Recommend checking a CBC in 1 week. 8.  Type 2 diabetes mellitus.  Last hemoglobin A1c 6.4.  Diet controlled.  No need for medications at this point 9.  Palliative care to follow at facility.  Patient is a DNR.  DISCHARGE CONDITIONS:   Satisfactory  CONSULTS OBTAINED:  Treatment Team:  Murlean Iba, MD Corey Skains, MD Bradly Bienenstock, NP  DRUG ALLERGIES:   Allergies  Allergen Reactions  . Tramadol Anaphylaxis  . Aspirin     "Stomach problems"    DISCHARGE MEDICATIONS:   Allergies as of 01/23/2020      Reactions   Tramadol Anaphylaxis  Aspirin    "Stomach problems"      Medication List    STOP taking these medications   amLODipine 5 MG tablet Commonly known as: NORVASC   cloNIDine 0.1 MG tablet Commonly known as: CATAPRES   furosemide 20 MG tablet Commonly known as: LASIX   insulin NPH Human 100 UNIT/ML injection Commonly known as: NOVOLIN N   lisinopril 10 MG tablet Commonly known as: ZESTRIL   metFORMIN 1000 MG tablet Commonly known as: GLUCOPHAGE   methylPREDNISolone 4 MG tablet Commonly known as: Medrol   potassium chloride 10 MEQ tablet Commonly known as: KLOR-CON     TAKE these medications    acetaminophen 500 MG tablet Commonly known as: TYLENOL Take 1,000 mg by mouth every 6 (six) hours as needed for moderate pain or headache.   albuterol 108 (90 Base) MCG/ACT inhaler Commonly known as: VENTOLIN HFA Inhale 2 puffs into the lungs every 6 (six) hours as needed for wheezing or shortness of breath.   alendronate 70 MG tablet Commonly known as: FOSAMAX Take 70 mg by mouth once a week.   atorvastatin 80 MG tablet Commonly known as: LIPITOR Take 80 mg by mouth daily.   CALCIUM 600 + D PO Take 1 tablet by mouth daily.   ciprofloxacin-dexamethasone OTIC suspension Commonly known as: CIPRODEX Place 4 drops into the left ear 2 (two) times daily for 3 days.   cyanocobalamin 1000 MCG/ML injection Commonly known as: (VITAMIN B-12) Inject 1,000 mcg into the muscle every 30 (thirty) days.   vitamin B-12 100 MCG tablet Commonly known as: CYANOCOBALAMIN Take 100 mcg by mouth daily.   Eliquis 2.5 MG Tabs tablet Generic drug: apixaban Take 2.5 mg by mouth 2 (two) times daily.   esomeprazole 20 MG capsule Commonly known as: NEXIUM Take 20 mg by mouth at bedtime.   feeding supplement (NEPRO CARB STEADY) Liqd Take 237 mLs by mouth 2 (two) times daily between meals.   ferrous sulfate 325 (65 FE) MG tablet Take 325 mg by mouth daily with breakfast.   ipratropium-albuterol 0.5-2.5 (3) MG/3ML Soln Commonly known as: DUONEB Take 3 mLs by nebulization 3 (three) times daily.   multivitamin Tabs tablet Take 1 tablet by mouth at bedtime.   sodium chloride 0.65 % Soln nasal spray Commonly known as: OCEAN Place 1 spray into both nostrils as needed for congestion.        DISCHARGE INSTRUCTIONS:   Follow-up team at rehab 1 day Follow-up nephrology 1 week  If you experience worsening of your admission symptoms, develop shortness of breath, life threatening emergency, suicidal or homicidal thoughts you must seek medical attention immediately by calling 911 or calling your  MD immediately  if symptoms less severe.  You Must read complete instructions/literature along with all the possible adverse reactions/side effects for all the Medicines you take and that have been prescribed to you. Take any new Medicines after you have completely understood and accept all the possible adverse reactions/side effects.   Please note  You were cared for by a hospitalist during your hospital stay. If you have any questions about your discharge medications or the care you received while you were in the hospital after you are discharged, you can call the unit and asked to speak with the hospitalist on call if the hospitalist that took care of you is not available. Once you are discharged, your primary care physician will handle any further medical issues. Please note that NO REFILLS for any discharge medications will be authorized  once you are discharged, as it is imperative that you return to your primary care physician (or establish a relationship with a primary care physician if you do not have one) for your aftercare needs so that they can reassess your need for medications and monitor your lab values.    Today   CHIEF COMPLAINT:   Chief Complaint  Patient presents with  . Weakness    HISTORY OF PRESENT ILLNESS:  Dorothy [name]  is a 85 y.o. female came in with weakness   VITAL SIGNS:  Blood pressure (!) 139/49, pulse 66, temperature 97.7 F (36.5 C), resp. rate 17, height 5\' 4"  (1.626 m), weight 84.6 kg, SpO2 91 %.  I/O:    Intake/Output Summary (Last 24 hours) at 01/23/2020 0950 Last data filed at 01/23/2020 0503 Gross per 24 hour  Intake 240 ml  Output 800 ml  Net -560 ml    PHYSICAL EXAMINATION:  GENERAL:  85 y.o.-year-old patient lying in the bed with no acute distress.  EYES: Pupils equal, round, reactive to light and accommodation. No scleral icterus. Extraocular muscles intact.  HEENT: Head atraumatic, normocephalic. Oropharynx and nasopharynx clear.    LUNGS: Decreased breath sounds bilateral bases, no wheezing, rales,rhonchi or crepitation. No use of accessory muscles of respiration.  CARDIOVASCULAR: S1, S2 normal. No murmurs, rubs, or gallops.  ABDOMEN: Soft, non-tender, non-distended. Bowel sounds present. No organomegaly or mass.  EXTREMITIES: 2+ pedal edema, no cyanosis, or clubbing.  NEUROLOGIC: Cranial nerves II through XII are intact. Muscle strength 5/5 in all extremities. Sensation intact. Gait not checked.  PSYCHIATRIC: The patient is alert and oriented x 3.  SKIN: No obvious rash, lesion, or ulcer.   DATA REVIEW:   CBC Recent Labs  Lab 01/23/20 0531  WBC 5.0  HGB 8.2*  HCT 26.8*  PLT 106*    Chemistries  Recent Labs  Lab 01/23/20 0531  NA 140  K 4.1  CL 102  CO2 30  GLUCOSE 137*  BUN 29*  CREATININE 1.88*  CALCIUM 8.4*    Microbiology Results  Results for orders placed or performed during the hospital encounter of 01/15/20  Respiratory Panel by RT PCR (Flu A&B, Covid) - Nasopharyngeal Swab     Status: None   Collection Time: 01/15/20  7:38 PM   Specimen: Nasopharyngeal Swab  Result Value Ref Range Status   SARS Coronavirus 2 by RT PCR NEGATIVE NEGATIVE Final    Comment: (NOTE) SARS-CoV-2 target nucleic acids are NOT DETECTED. The SARS-CoV-2 RNA is generally detectable in upper respiratoy specimens during the acute phase of infection. The lowest concentration of SARS-CoV-2 viral copies this assay can detect is 131 copies/mL. A negative result does not preclude SARS-Cov-2 infection and should not be used as the sole basis for treatment or other patient management decisions. A negative result may occur with  improper specimen collection/handling, submission of specimen other than nasopharyngeal swab, presence of viral mutation(s) within the areas targeted by this assay, and inadequate number of viral copies (<131 copies/mL). A negative result must be combined with clinical observations, patient  history, and epidemiological information. The expected result is Negative. Fact Sheet for Patients:  PinkCheek.be Fact Sheet for Healthcare Providers:  GravelBags.it This test is not yet ap proved or cleared by the [place] FDA and  has been authorized for detection and/or diagnosis of SARS-CoV-2 by FDA under an Emergency Use Authorization (EUA). This EUA will remain  in effect (meaning this test can be used) for the duration of the COVID-19  declaration under Section 564(b)(1) of the Act, 21 U.S.C. section 360bbb-3(b)(1), unless the authorization is terminated or revoked sooner.    Influenza A by PCR NEGATIVE NEGATIVE Final   Influenza B by PCR NEGATIVE NEGATIVE Final    Comment: (NOTE) The Xpert Xpress SARS-CoV-2/FLU/RSV assay is intended as an aid in  the diagnosis of influenza from Nasopharyngeal swab specimens and  should not be used as a sole basis for treatment. Nasal washings and  aspirates are unacceptable for Xpert Xpress SARS-CoV-2/FLU/RSV  testing. Fact Sheet for Patients: PinkCheek.be Fact Sheet for Healthcare Providers: GravelBags.it This test is not yet approved or cleared by the [place] FDA and  has been authorized for detection and/or diagnosis of SARS-CoV-2 by  FDA under an Emergency Use Authorization (EUA). This EUA will remain  in effect (meaning this test can be used) for the duration of the  Covid-19 declaration under Section 564(b)(1) of the Act, 21  U.S.C. section 360bbb-3(b)(1), unless the authorization is  terminated or revoked. Performed at [facility], [address] [place], [place] [address]   Blood Culture (routine x 2)     Status: None   Collection Time: 01/15/20  7:46 PM   Specimen: BLOOD RIGHT WRIST  Result Value Ref Range Status   Specimen Description BLOOD RIGHT WRIST  Final   Special Requests BOTTLES DRAWN  AEROBIC AND ANAEROBIC  Final   Culture   Final    NO GROWTH 5 DAYS Performed at [facility], [address] [place], [place] [address]    Report Status 01/20/2020 FINAL  Final  Blood Culture (routine x 2)     Status: None   Collection Time: 01/16/20  5:26 AM   Specimen: Left Antecubital; Blood  Result Value Ref Range Status   Specimen Description LEFT ANTECUBITAL  Final   Special Requests   Final    BOTTLES DRAWN AEROBIC AND ANAEROBIC Blood Culture adequate volume   Culture   Final    NO GROWTH 5 DAYS Performed at [facility], [address] [place], [place] [address]    Report Status 01/21/2020 FINAL  Final  MRSA PCR Screening     Status: None   Collection Time: 01/16/20 11:51 PM   Specimen: Nasopharyngeal  Result Value Ref Range Status   MRSA by PCR NEGATIVE NEGATIVE Final    Comment:        The GeneXpert MRSA Assay (FDA approved for NASAL specimens only), is one component of a comprehensive MRSA colonization surveillance program. It is not intended to diagnose MRSA infection nor to guide or monitor treatment for MRSA infections. Performed at [facility], [address], [place], [place] [address]     Management plans discussed with the patient, family and they are in agreement.  CODE STATUS:     Code Status Orders  (From admission, onward)         Start     Ordered   01/16/20 1443  Do not attempt resuscitation (DNR)  Continuous    Question Answer Comment  In the event of cardiac or respiratory ARREST Do not call a "code blue"   In the event of cardiac or respiratory ARREST Do not perform Intubation, CPR, defibrillation or ACLS   In the event of cardiac or respiratory ARREST Use medication by any route, position, wound care, and other measures to relive pain and suffering. May use oxygen, suction and manual treatment of airway obstruction as needed for comfort.  01/16/20 1442        Code Status History    Date Active  Date Inactive Code Status Order ID Comments User Context   01/15/2020 2045 01/16/2020 1442 Full Code [id number]  Sidney Ace Arvella Merles, MD ED   10/06/2019 1214 10/12/2019 0006 Full Code [id number]  Enzo Bi, MD Inpatient   12/19/2016 0023 12/22/2016 1915 Full Code [id number]  Lance Coon, MD Inpatient   01/09/2015 1308 01/10/2015 1949 Full Code [id number]  Leonard Schwartz, RN Inpatient   Advance Care Planning Activity      TOTAL TIME TAKING CARE OF THIS PATIENT: 35 minutes.    Loletha Grayer M.D on 01/23/2020 at 9:50 AM  Between 7am to 6pm - Pager - [phone]  After 6pm go to [email or web address] - password [email or web address]  [facility]  CC: Primary care physician; Letta Median, MD

## 2020-01-23 NOTE — TOC Transition Note (Signed)
Transition of Care [facility]) - CM/SW Discharge Note   Patient Details  Name: Dorothy [name] MRN: [id number] Date of Birth: 1934-10-18  Transition of Care [facility]) CM/SW Contact:  Victorino Dike, RN Phone Number: 01/23/2020, 12:32 PM   Clinical Narrative:       Final next level of care: [place]     Patient Goals and CMS Choice        Discharge Placement              Patient chooses bed at: [facility] Patient to be transferred to facility by: [facility] Name of family member notified: Granddaughter [name] Patient and family notified of of transfer: 01/23/20  Discharge Plan and Services                                     Social Determinants of Health (SDOH) Interventions     Readmission Risk Interventions No flowsheet data found.

## 2020-01-23 NOTE — Progress Notes (Signed)
Patient discharged to [facility].  Brought via EMS. Report called and given to Legrand Como.  Belongings sent home with family.

## 2020-01-23 NOTE — Progress Notes (Signed)
[facility]  ROUNDING NOTE   Subjective:  It appears her renal function overall has improved. Creatinine down to 1.88. Urine output was 800 cc over the preceding 24 hours. Patient being discharged to rehabilitation center.  Objective:  Vital signs in last 24 hours:  Temp:  [97.7 F (36.5 C)-98.5 F (36.9 C)] 98.5 F (36.9 C) (05/14 1147) Pulse Rate:  [60-71] 67 (05/14 1147) Resp:  [16-17] 16 (05/14 1147) BP: (135-146)/(49-65) 135/50 (05/14 1147) SpO2:  [91 %-95 %] 95 % (05/14 1147) Weight:  [84.6 kg] 84.6 kg (05/14 0502)  Weight change: -0.272 kg Filed Weights   01/21/20 0520 01/22/20 0352 01/23/20 0502  Weight: 87.3 kg 84.9 kg 84.6 kg    Intake/Output: I/O last 3 completed shifts: In: 331.2 [P.O.:240; I.V.:91.2] Out: 1300 [Urine:1300]   Intake/Output this shift:  Total I/O In: 240 [P.O.:240] Out: 251 [Urine:250; Stool:1]  Physical Exam: General: No acute distress  Head: Normocephalic, atraumatic. Moist oral mucosal membranes  Eyes: Anicteric  Neck: Supple, trachea midline  Lungs:  Clear to auscultation, normal effort  Heart: S1S2 irregular  Abdomen:  Soft, nontender, bowel sounds present  Extremities: Trace peripheral edema.  Neurologic: Awake, alert, following commands  Skin: No lesions  Access: Right IJ PermCath    Basic Metabolic Panel: Recent Labs  Lab 01/19/20 0707 01/19/20 0707 01/20/20 0611 01/20/20 0611 01/21/20 0431 01/22/20 0422 01/23/20 0531  NA 140  --  144  --  138 141 140  K 3.6  --  3.6  --  3.3* 3.6 4.1  CL 103  --  105  --  99 101 102  CO2 24  --  25  --  30 32 30  GLUCOSE 118*  --  99  --  128* 140* 137*  BUN 69*  --  51*  --  26* 30* 29*  CREATININE 5.49*  --  4.00*  --  2.32* 2.09* 1.88*  CALCIUM 7.0*   < > 7.5*   < > 7.7* 8.2* 8.4*  PHOS 5.9*  --   --   --   --   --   --    < > = values in this interval not displayed.    Liver Function Tests: No results for input(s): AST, ALT, ALKPHOS, BILITOT, PROT, ALBUMIN  in the last 168 hours. No results for input(s): LIPASE, AMYLASE in the last 168 hours. No results for input(s): AMMONIA in the last 168 hours.  CBC: Recent Labs  Lab 01/17/20 0723 01/18/20 0604 01/19/20 0707 01/23/20 0531  WBC 7.7 6.5 6.9 5.0  NEUTROABS 6.7 5.8 6.0  --   HGB 8.4* 8.6* 8.7* 8.2*  HCT 28.2* 28.0* 28.8* 26.8*  MCV 87.3 83.6 85.5 85.1  PLT 185 189 187 106*    Cardiac Enzymes: No results for input(s): CKTOTAL, CKMB, CKMBINDEX, TROPONINI in the last 168 hours.  BNP: Invalid input(s): POCBNP  CBG: Recent Labs  Lab 01/22/20 1156 01/22/20 1659 01/22/20 2056 01/23/20 0745 01/23/20 1146  GLUCAP 122* 168* 135* 122* 159*    Microbiology: Results for orders placed or performed during the hospital encounter of 01/15/20  Respiratory Panel by RT PCR (Flu A&B, Covid) - Nasopharyngeal Swab     Status: None   Collection Time: 01/15/20  7:38 PM   Specimen: Nasopharyngeal Swab  Result Value Ref Range Status   SARS Coronavirus 2 by RT PCR NEGATIVE NEGATIVE Final    Comment: (NOTE) SARS-CoV-2 target nucleic acids are NOT DETECTED. The SARS-CoV-2 RNA is generally detectable  in upper respiratoy specimens during the acute phase of infection. The lowest concentration of SARS-CoV-2 viral copies this assay can detect is 131 copies/mL. A negative result does not preclude SARS-Cov-2 infection and should not be used as the sole basis for treatment or other patient management decisions. A negative result may occur with  improper specimen collection/handling, submission of specimen other than nasopharyngeal swab, presence of viral mutation(s) within the areas targeted by this assay, and inadequate number of viral copies (<131 copies/mL). A negative result must be combined with clinical observations, patient history, and epidemiological information. The expected result is Negative. Fact Sheet for Patients:  PinkCheek.be Fact Sheet for Healthcare  Providers:  GravelBags.it This test is not yet ap proved or cleared by the [place] FDA and  has been authorized for detection and/or diagnosis of SARS-CoV-2 by FDA under an Emergency Use Authorization (EUA). This EUA will remain  in effect (meaning this test can be used) for the duration of the COVID-19 declaration under Section 564(b)(1) of the Act, 21 U.S.C. section 360bbb-3(b)(1), unless the authorization is terminated or revoked sooner.    Influenza A by PCR NEGATIVE NEGATIVE Final   Influenza B by PCR NEGATIVE NEGATIVE Final    Comment: (NOTE) The Xpert Xpress SARS-CoV-2/FLU/RSV assay is intended as an aid in  the diagnosis of influenza from Nasopharyngeal swab specimens and  should not be used as a sole basis for treatment. Nasal washings and  aspirates are unacceptable for Xpert Xpress SARS-CoV-2/FLU/RSV  testing. Fact Sheet for Patients: PinkCheek.be Fact Sheet for Healthcare Providers: GravelBags.it This test is not yet approved or cleared by the [place] FDA and  has been authorized for detection and/or diagnosis of SARS-CoV-2 by  FDA under an Emergency Use Authorization (EUA). This EUA will remain  in effect (meaning this test can be used) for the duration of the  Covid-19 declaration under Section 564(b)(1) of the Act, 21  U.S.C. section 360bbb-3(b)(1), unless the authorization is  terminated or revoked. Performed at [facility], [address] [place], [place] [address]   Blood Culture (routine x 2)     Status: None   Collection Time: 01/15/20  7:46 PM   Specimen: BLOOD RIGHT WRIST  Result Value Ref Range Status   Specimen Description BLOOD RIGHT WRIST  Final   Special Requests BOTTLES DRAWN AEROBIC AND ANAEROBIC  Final   Culture   Final    NO GROWTH 5 DAYS Performed at [facility], [address] [place], [place] [address]    Report Status  01/20/2020 FINAL  Final  Blood Culture (routine x 2)     Status: None   Collection Time: 01/16/20  5:26 AM   Specimen: Left Antecubital; Blood  Result Value Ref Range Status   Specimen Description LEFT ANTECUBITAL  Final   Special Requests   Final    BOTTLES DRAWN AEROBIC AND ANAEROBIC Blood Culture adequate volume   Culture   Final    NO GROWTH 5 DAYS Performed at [facility], [address] [place], [place] [address]    Report Status 01/21/2020 FINAL  Final  MRSA PCR Screening     Status: None   Collection Time: 01/16/20 11:51 PM   Specimen: Nasopharyngeal  Result Value Ref Range Status   MRSA by PCR NEGATIVE NEGATIVE Final    Comment:        The GeneXpert MRSA Assay (FDA approved for NASAL specimens only), is one component of a comprehensive MRSA colonization surveillance program.  It is not intended to diagnose MRSA infection nor to guide or monitor treatment for MRSA infections. Performed at [facility], [address] [place], [place] [address]   SARS Coronavirus 2 by RT PCR (hospital order, performed in [facility] hospital lab) Nasopharyngeal Nasopharyngeal Swab     Status: None   Collection Time: 01/23/20  9:04 AM   Specimen: Nasopharyngeal Swab  Result Value Ref Range Status   SARS Coronavirus 2 NEGATIVE NEGATIVE Final    Comment: (NOTE) SARS-CoV-2 target nucleic acids are NOT DETECTED. The SARS-CoV-2 RNA is generally detectable in upper and lower respiratory specimens during the acute phase of infection. The lowest concentration of SARS-CoV-2 viral copies this assay can detect is 250 copies / mL. A negative result does not preclude SARS-CoV-2 infection and should not be used as the sole basis for treatment or other patient management decisions.  A negative result may occur with improper specimen collection / handling, submission of specimen other than nasopharyngeal swab, presence of viral mutation(s) within the areas targeted by this  assay, and inadequate number of viral copies (<250 copies / mL). A negative result must be combined with clinical observations, patient history, and epidemiological information. Fact Sheet for Patients:   StrictlyIdeas.no Fact Sheet for Healthcare Providers: BankingDealers.co.za This test is not yet approved or cleared  by the [place] FDA and has been authorized for detection and/or diagnosis of SARS-CoV-2 by FDA under an Emergency Use Authorization (EUA).  This EUA will remain in effect (meaning this test can be used) for the duration of the COVID-19 declaration under Section 564(b)(1) of the Act, 21 U.S.C. section 360bbb-3(b)(1), unless the authorization is terminated or revoked sooner. Performed at [facility], [address] [place], [place] [address]     Coagulation Studies: No results for input(s): LABPROT, INR in the last 72 hours.  Urinalysis: No results for input(s): COLORURINE, LABSPEC, PHURINE, GLUCOSEU, HGBUR, BILIRUBINUR, KETONESUR, PROTEINUR, UROBILINOGEN, NITRITE, LEUKOCYTESUR in the last 72 hours.  Invalid input(s): APPERANCEUR    Imaging: No results found.   Medications:   . sodium chloride    . sodium chloride    . sodium chloride 250 mL (01/20/20 2006)   . apixaban  2.5 mg Oral BID  . atorvastatin  80 mg Oral Daily  . calcium-vitamin D  1 tablet Oral Daily  . Chlorhexidine Gluconate Cloth  6 each Topical Daily  . ciprofloxacin-dexamethasone  4 drop Left EAR BID  . cyanocobalamin  1,000 mcg Intramuscular Q30 days  . feeding supplement (NEPRO CARB STEADY)  237 mL Oral BID BM  . ferrous sulfate  325 mg Oral Q lunch  . guaiFENesin  600 mg Oral BID  . insulin aspart  0-9 Units Subcutaneous TID WC  . ipratropium-albuterol  3 mL Nebulization Q6H  . multivitamin  1 tablet Oral QHS  . pantoprazole  40 mg Oral Daily  . sodium chloride flush  10 mL Intravenous Q12H   sodium chloride, sodium  chloride, sodium chloride, acetaminophen **OR** acetaminophen, albuterol, alteplase, heparin, lidocaine (PF), lidocaine-prilocaine, ondansetron **OR** ondansetron (ZOFRAN) IV, ondansetron (ZOFRAN) IV, pentafluoroprop-tetrafluoroeth, sodium chloride, traZODone  Assessment/ Plan:  85 y.o. female with atrial fibrillation, congestive heart failure, COPD, diabetes mellitus, hypertension, obstructive sleep apnea, chronic kidney disease stage IIIb baseline EGFR 35 who was admitted with acute kidney injury, multifocal pneumonia.  1.  Acute kidney injury/chronic kidney disease stage IIIb baseline creatinine 1.4/EGFR 35.  Renal ultrasound negative for hydronephrosis but bilateral cysts noted. -Urine output was 800 cc  over the preceding 24 hours.  Renal function appears to be improving.  We will leave PermCath in place for now until she is reevaluated as an outpatient.  2.  Metabolic acidosis.  Serum bicarbonate currently thirty and acceptable.  Continue to monitor.  3.  Anemia of chronic kidney disease.  Hemoglobin currently 8.2 and will need to be monitored.  Consider Epogen as an outpatient.  4.  We will plan to see the patient back in short-term follow-up in the office.   LOS: 8 Kelleen Stolze 5/14/20213:49 PM

## 2020-01-23 NOTE — Discharge Instructions (Signed)

## 2020-01-27 ENCOUNTER — Ambulatory Visit: Payer: No Typology Code available for payment source | Attending: Family | Admitting: Family

## 2020-01-27 ENCOUNTER — Other Ambulatory Visit: Payer: Self-pay

## 2020-01-27 ENCOUNTER — Encounter: Payer: Self-pay | Admitting: Family

## 2020-01-27 ENCOUNTER — Telehealth: Payer: Self-pay | Admitting: Family

## 2020-01-27 VITALS — BP 152/81 | HR 77 | Resp 15 | Ht 65.0 in | Wt 184.0 lb

## 2020-01-27 DIAGNOSIS — Z79899 Other long term (current) drug therapy: Secondary | ICD-10-CM | POA: Diagnosis not present

## 2020-01-27 DIAGNOSIS — I5032 Chronic diastolic (congestive) heart failure: Secondary | ICD-10-CM

## 2020-01-27 DIAGNOSIS — Z886 Allergy status to analgesic agent status: Secondary | ICD-10-CM | POA: Insufficient documentation

## 2020-01-27 DIAGNOSIS — G4733 Obstructive sleep apnea (adult) (pediatric): Secondary | ICD-10-CM | POA: Insufficient documentation

## 2020-01-27 DIAGNOSIS — I10 Essential (primary) hypertension: Secondary | ICD-10-CM

## 2020-01-27 DIAGNOSIS — Z885 Allergy status to narcotic agent status: Secondary | ICD-10-CM | POA: Diagnosis not present

## 2020-01-27 DIAGNOSIS — E119 Type 2 diabetes mellitus without complications: Secondary | ICD-10-CM | POA: Diagnosis not present

## 2020-01-27 DIAGNOSIS — J449 Chronic obstructive pulmonary disease, unspecified: Secondary | ICD-10-CM | POA: Diagnosis not present

## 2020-01-27 DIAGNOSIS — E785 Hyperlipidemia, unspecified: Secondary | ICD-10-CM | POA: Insufficient documentation

## 2020-01-27 DIAGNOSIS — E1122 Type 2 diabetes mellitus with diabetic chronic kidney disease: Secondary | ICD-10-CM

## 2020-01-27 DIAGNOSIS — M1990 Unspecified osteoarthritis, unspecified site: Secondary | ICD-10-CM | POA: Insufficient documentation

## 2020-01-27 DIAGNOSIS — Z7901 Long term (current) use of anticoagulants: Secondary | ICD-10-CM | POA: Insufficient documentation

## 2020-01-27 DIAGNOSIS — I110 Hypertensive heart disease with heart failure: Secondary | ICD-10-CM | POA: Insufficient documentation

## 2020-01-27 DIAGNOSIS — I4891 Unspecified atrial fibrillation: Secondary | ICD-10-CM | POA: Diagnosis not present

## 2020-01-27 DIAGNOSIS — K219 Gastro-esophageal reflux disease without esophagitis: Secondary | ICD-10-CM | POA: Insufficient documentation

## 2020-01-27 NOTE — Telephone Encounter (Signed)
Called [facility] to Confirm patients follow up appointment with [name] at the [facility] for today at 2pm. They said she is doing well and has no complaints. She is doing daily weights, and following a [name] sodium diet. She is taking her medications as well without any issues.    Dorothy [name], [place]

## 2020-01-27 NOTE — Progress Notes (Signed)
[facility] - PHARMACIST COUNSELING NOTE  ADHERENCE ASSESSMENT  Adherence strategy: Patient is currently residing at [facility] and facility administers medications.   Do you ever forget to take your medication? [] Yes (1) [x] No (0)  Do you ever skip doses due to side effects? [] Yes (1) [x] No (0)  Do you have trouble affording your medicines? [] Yes (1) [x] No (0)  Are you ever unable to pick up your medication due to transportation difficulties? [] Yes (1) [x] No (0)  Do you ever stop taking your medications because you don't believe they are helping? [] Yes (1) [x] No (0)  Total score 0    Recommendations given to patient about increasing adherence: None needed right now. Will need to re-assess when patient returns to community living.  Guideline-Directed Medical Therapy/Evidence Based Medicine  ACE/ARB/ARNI: Lisinopril 10 mg daily (d/c at hospital discharge) Beta Blocker: None Aldosterone Antagonist: None Diuretic: Furosemide 20 mg daily (d/c at hospital discharge)    SUBJECTIVE  HPI: Patient is an 85 y/o F with PMH as below who presents to CHF clinic for follow-up. She was recently hospitalized 5/6 - 5/14 for acute on chronic respiratory failure, sepsis with multifocal pneumonia, and acute renal failure requiring intermittent dialysis. She was discharged to rehab facility.   Past Medical History:  Diagnosis Date  . Anemia   . Arthritis    hands, knees, back  . Atrial fibrillation ([place])   . CHF (congestive heart failure) ([place])   . COPD (chronic obstructive pulmonary disease) ([place])   . Diabetes mellitus without complication ([place])   . Dyspnea   . GERD (gastroesophageal reflux disease)   . Hyperlipidemia   . Hypertension   . PONV (postoperative nausea and vomiting)   . Sleep apnea    told she needs CPAP, does not have  . Wears dentures    full upper and lower.  only wears upper      OBJECTIVE   Vital signs: HR 77, BP  152/81, weight (pounds) 184 ECHO: Date 01/16/20, EF 55-60%, notes: LV diastolic function not evaluated. LA + RA mildly dilated. No significant valvular disease.   BMP Latest Ref Rng & Units 01/23/2020 01/22/2020 01/21/2020  Glucose 70 - 99 mg/dL 137(H) 140(H) 128(H)  BUN 8 - 23 mg/dL 29(H) 30(H) 26(H)  Creatinine 0.44 - 1.00 mg/dL 1.88(H) 2.09(H) 2.32(H)  Sodium 135 - 145 mmol/L 140 141 138  Potassium 3.5 - 5.1 mmol/L 4.1 3.6 3.3(L)  Chloride 98 - 111 mmol/L 102 101 99  CO2 22 - 32 mmol/L 30 32 30  Calcium 8.9 - 10.3 mg/dL 8.4(L) 8.2(L) 7.7(L)    ASSESSMENT  Patient is well appearing in no acute distress. She is wearing oxygen which is chronic. Currently residing in SNF for rehabilitation. Swelling apparent in hands and trace edema in lower extremities.   PLAN  1). CHF -BNP 308 on 01/17/20 -Furosemide discontinued after recent hospitalization secondary to acute renal failure -Continue daily weights -Continue sodium restricted diet -Discussed with provider - no changes to medication regimen today  2). Hypertension -Hypertensive today in office -Antihypertensive medications including amlodipine, clonidine, furosemide, lisinopril were discontinued during last hospitalization secondary to low blood pressures  3). Atrial fibrillation -Not on rhythm or rate control. HR 77 today -Anticoagulation with apixaban 2.5 mg BID  -Dose appears to have been reduced secondary to chronic anemia / possible GIB  4). Acute renal failure -Secondary to ATN / sepsis / medications -Following with nephrology -Patient has Permcath until nephrology determines if further  dialysis warranted -Patient reports making urine normally   5). Anemia -Hx of IDA -Hgb 8.2 on 01/23/20 -01/01/20 iron panel with saturation ratio 7%, ferritin 19 -Ferrous sulfate 325 Qbreakfast  6). T2DM, controlled -Metformin discontinued during hospitalization secondary to ARF -Last HgbA1C was 6.4% on 01/16/20  Time spent: 15  minutes  [place] Resident 01/27/2020 1:44 PM    Current Outpatient Medications:  .  acetaminophen (TYLENOL) 500 MG tablet, Take 1,000 mg by mouth every 6 (six) hours as needed for moderate pain or headache., Disp: , Rfl:  .  albuterol (VENTOLIN HFA) 108 (90 Base) MCG/ACT inhaler, Inhale 2 puffs into the lungs every 6 (six) hours as needed for wheezing or shortness of breath., Disp: 6.7 g, Rfl: 3 .  alendronate (FOSAMAX) 70 MG tablet, Take 70 mg by mouth once a week. , Disp: , Rfl:  .  apixaban (ELIQUIS) 2.5 MG TABS tablet, Take 2.5 mg by mouth 2 (two) times daily. , Disp: , Rfl:  .  atorvastatin (LIPITOR) 80 MG tablet, Take 80 mg by mouth daily., Disp: , Rfl:  .  Calcium Carb-Cholecalciferol (CALCIUM 600 + D PO), Take 1 tablet by mouth daily. , Disp: , Rfl:  .  cyanocobalamin (,VITAMIN B-12,) 1000 MCG/ML injection, Inject 1,000 mcg into the muscle every 30 (thirty) days. , Disp: , Rfl:  .  esomeprazole (NEXIUM) 20 MG capsule, Take 20 mg by mouth at bedtime., Disp: , Rfl:  .  ferrous sulfate 325 (65 FE) MG tablet, Take 325 mg by mouth daily with breakfast., Disp: , Rfl:  .  ipratropium-albuterol (DUONEB) 0.5-2.5 (3) MG/3ML SOLN, Take 3 mLs by nebulization 3 (three) times daily., Disp: 360 mL, Rfl: 3 .  multivitamin (RENA-VIT) TABS tablet, Take 1 tablet by mouth at bedtime., Disp: 30 tablet, Rfl: 0 .  Nutritional Supplements (FEEDING SUPPLEMENT, NEPRO CARB STEADY,) LIQD, Take 237 mLs by mouth 2 (two) times daily between meals., Disp: 14400 mL, Rfl: 0 .  sodium chloride (OCEAN) 0.65 % SOLN nasal spray, Place 1 spray into both nostrils as needed for congestion., Disp: 15 mL, Rfl: 0 .  vitamin B-12 (CYANOCOBALAMIN) 100 MCG tablet, Take 100 mcg by mouth daily., Disp: , Rfl:    DRUGS TO AVOID IN HEART FAILURE  Drug or Class Mechanism  Analgesics . NSAIDs . COX-2 inhibitors . Glucocorticoids  Sodium and water retention, increased systemic vascular resistance, decreased response  to diuretics   Diabetes Medications . Metformin . Thiazolidinediones o Rosiglitazone (Avandia) o Pioglitazone (Actos) . DPP4 Inhibitors o Saxagliptin (Onglyza) o Sitagliptin (Januvia)   Lactic acidosis Possible calcium channel blockade   Unknown  Antiarrhythmics . Class I  o Flecainide o Disopyramide . Class III o Sotalol . Other o Dronedarone  Negative inotrope, proarrhythmic   Proarrhythmic, beta blockade  Negative inotrope  Antihypertensives . Alpha Blockers o Doxazosin . Calcium Channel Blockers o Diltiazem o Verapamil o Nifedipine . Central Alpha Adrenergics o Moxonidine . Peripheral Vasodilators o Minoxidil  Increases renin and aldosterone  Negative inotrope    Possible sympathetic withdrawal  Unknown  Anti-infective . Itraconazole . Amphotericin B  Negative inotrope Unknown  Hematologic . Anagrelide . Cilostazol   Possible inhibition of PD IV Inhibition of PD III causing arrhythmias  Neurologic/Psychiatric . Stimulants . Anti-Seizure Drugs o Carbamazepine o Pregabalin . Antidepressants o Tricyclics o Citalopram . Parkinsons o Bromocriptine o Pergolide o Pramipexole . Antipsychotics o Clozapine . Antimigraine o Ergotamine o Methysergide . Appetite suppressants . Bipolar o Lithium  Peripheral alpha  and beta agonist activity  Negative inotrope and chronotrope Calcium channel blockade  Negative inotrope, proarrhythmic Dose-dependent QT prolongation  Excessive serotonin activity/valvular damage Excessive serotonin activity/valvular damage Unknown  IgE mediated hypersensitivy, calcium channel blockade  Excessive serotonin activity/valvular damage Excessive serotonin activity/valvular damage Valvular damage  Direct myofibrillar degeneration, adrenergic stimulation  Antimalarials . Chloroquine . Hydroxychloroquine Intracellular inhibition of lysosomal enzymes  Urologic Agents . Alpha  Blockers o Doxazosin o Prazosin o Tamsulosin o Terazosin  Increased renin and aldosterone  Adapted from Page RL, et al. "Drugs That May Cause or Exacerbate Heart Failure: A Scientific Statement from the [employer] [facility]." Circulation 2016; 185:B01-T86. DOI: 10.1161/CIR.0000000000000426   MEDICATION ADHERENCES TIPS AND STRATEGIES 1. Taking medication as prescribed improves patient outcomes in heart failure (reduces hospitalizations, improves symptoms, increases survival) 2. Side effects of medications can be managed by decreasing doses, switching agents, stopping drugs, or adding additional therapy. Please let someone in the [facility] know if you have having bothersome side effects so we can modify your regimen. Do not alter your medication regimen without talking to [name].  3. Medication reminders can help patients remember to take drugs on time. If you are missing or forgetting doses you can try linking behaviors, using pill boxes, or an electronic reminder like an alarm on your phone or an app. Some people can also get automated phone calls as medication reminders.

## 2020-01-27 NOTE — Patient Instructions (Signed)
Continue weighing daily and call for an overnight weight gain of > 2 pounds or a weekly weight gain of >5 pounds. 

## 2020-01-27 NOTE — Progress Notes (Signed)
Patient ID: Dorothy [name], female    DOB: March 30, 1935, 85 y.o.   MRN: [id number]  HPI  Dorothy [name] is a 85 y/o female with a history of anemia, atrial fibrillation, COPD, DM, GERD, hyperlipidemia, HTN, obstructive sleep apnea (unable to wear CPAP due to cost) and chronic heart failure.   Echo report from 01/16/20 reviewed and showed an EF of 55-60% along with mildly elevated PA pressure and mild MR but without LVH/LAE. Reviewed echo report done on 12/19/16 and it showed an EF of 55-60% along with moderate MR and mod/severe TR.   Admitted 01/15/20 due to acute on chronic hypoxic respiratory failure. Cardiology, nephrology, vascular and palliative care consult obtained. Needed 6L oxygen but then able to get weaned down to her baseline. Given antibiotics due to pneumonia. Did need a couple of dialysis sessions with improvement of renal function. Discharged after 8 days to SNF.   She presents today for her follow-up visit although hasn't been seen in 3 years. She presents with a chief complaint of minimal fatigue upon moderate exertion. She describes this as chronic in nature having been present for several years. She has associated pedal edema, back pain and easy bruising along with this. She denies any difficulty sleeping, dizziness, abdominal distention, palpitations, chest pain, shortness of breath or cough.   Says that she's getting weighed but she isn't sure if it's happening daily. Is currently receiving PT services at [facility]  Past Medical History:  Diagnosis Date  . Anemia   . Arthritis    hands, knees, back  . Atrial fibrillation ([place])   . CHF (congestive heart failure) ([facility])   . COPD (chronic obstructive pulmonary disease) ([place])   . Diabetes mellitus without complication ([place])   . Dyspnea   . GERD (gastroesophageal reflux disease)   . Hyperlipidemia   . Hypertension   . PONV (postoperative nausea and vomiting)   . Sleep apnea    told she needs CPAP, does not have  . Wears dentures     full upper and lower.  only wears upper   Past Surgical History:  Procedure Laterality Date  . ABDOMINAL HYSTERECTOMY    . CATARACT EXTRACTION W/PHACO Right 07/12/2016   Procedure: CATARACT EXTRACTION PHACO AND INTRAOCULAR LENS PLACEMENT (IOC);  Surgeon: Leandrew Koyanagi, MD;  Location: [facility];  Service: Ophthalmology;  Laterality: Right;  DIABETIC - insulin and oral meds sleep apnea  . CATARACT EXTRACTION W/PHACO Left 04/09/2019   Procedure: CATARACT EXTRACTION PHACO AND INTRAOCULAR LENS PLACEMENT ([place])  LEFT DIABETIC;  Surgeon: Leandrew Koyanagi, MD;  Location: [facility];  Service: Ophthalmology;  Laterality: Left;  Diabetic - insulin and oral meds  . CHOLECYSTECTOMY    . COLONOSCOPY N/A 01/19/2015   Procedure: COLONOSCOPY;  Surgeon: Josefine Class, MD;  Location: [facility] ENDOSCOPY;  Service: Endoscopy;  Laterality: N/A;  . COLONOSCOPY WITH PROPOFOL N/A 07/11/2017   Procedure: COLONOSCOPY WITH PROPOFOL;  Surgeon: Toledo, Benay Pike, MD;  Location: [facility] ENDOSCOPY;  Service: Gastroenterology;  Laterality: N/A;  . DIALYSIS/PERMA CATHETER INSERTION N/A 01/19/2020   Procedure: DIALYSIS/PERMA CATHETER INSERTION;  Surgeon: Algernon Huxley, MD;  Location: [facility];  Service: Cardiovascular;  Laterality: N/A;  . ESOPHAGOGASTRODUODENOSCOPY N/A 01/19/2015   Procedure: ESOPHAGOGASTRODUODENOSCOPY (EGD);  Surgeon: Josefine Class, MD;  Location[facility] ENDOSCOPY;  Service: Endoscopy;  Laterality: N/A;  . ESOPHAGOGASTRODUODENOSCOPY (EGD) WITH PROPOFOL N/A 07/11/2017   Procedure: ESOPHAGOGASTRODUODENOSCOPY (EGD) WITH PROPOFOL;  Surgeon: Toledo, Benay Pike, MD;  Location: [facility] ENDOSCOPY;  Service:  Gastroenterology;  Laterality: N/A;  . SHOULDER ARTHROSCOPY     No family history on file. Social History   Tobacco Use  . Smoking status: Never Smoker  . Smokeless tobacco: Never Used  Substance Use Topics  . Alcohol use: No   Allergies  Allergen Reactions  . Tramadol  Anaphylaxis  . Aspirin     "Stomach problems"   Prior to Admission medications   Medication Sig Start Date End Date Taking? Authorizing Provider  acetaminophen (TYLENOL) 500 MG tablet Take 1,000 mg by mouth every 6 (six) hours as needed for moderate pain or headache.   Yes [provider]  albuterol (VENTOLIN HFA) 108 (90 Base) MCG/ACT inhaler Inhale 2 puffs into the lungs every 6 (six) hours as needed for wheezing or shortness of breath. 10/11/19  Yes Sreenath, Sudheer B, MD  alendronate (FOSAMAX) 70 MG tablet Take 70 mg by mouth once a week.    Yes [provider]  apixaban (ELIQUIS) 2.5 MG TABS tablet Take 2.5 mg by mouth 2 (two) times daily.    Yes [provider]  atorvastatin (LIPITOR) 80 MG tablet Take 80 mg by mouth daily.   Yes [provider]  Calcium Carb-Cholecalciferol (CALCIUM 600 + D PO) Take 1 tablet by mouth daily.    Yes [provider]  cyanocobalamin (,VITAMIN B-12,) 1000 MCG/ML injection Inject 1,000 mcg into the muscle every 30 (thirty) days.    Yes [provider]  esomeprazole (NEXIUM) 20 MG capsule Take 20 mg by mouth at bedtime.   Yes [provider]  ferrous sulfate 325 (65 FE) MG tablet Take 325 mg by mouth daily with breakfast.   Yes [provider]  Ipratropium-Albuterol (COMBIVENT RESPIMAT) 20-100 MCG/ACT AERS respimat Inhale 1 puff into the lungs 3 (three) times daily.   Yes [provider]  multivitamin (RENA-VIT) TABS tablet Take 1 tablet by mouth at bedtime. 01/23/20  Yes Wieting, Richard, MD  Nutritional Supplements (FEEDING SUPPLEMENT, NEPRO CARB STEADY,) LIQD Take 237 mLs by mouth 2 (two) times daily between meals. 01/23/20  Yes Wieting, Richard, MD  sodium chloride (OCEAN) 0.65 % SOLN nasal spray Place 1 spray into both nostrils as needed for congestion. 01/23/20  Yes Wieting, Richard, MD  vitamin B-12 (CYANOCOBALAMIN) 100 MCG tablet Take 100 mcg by mouth daily.   Yes [provider]  ipratropium-albuterol (DUONEB) 0.5-2.5 (3) MG/3ML SOLN Take 3 mLs by nebulization 3 (three) times daily. Patient not taking: Reported on 01/27/2020 10/11/19   Sidney Ace, MD     Review of Systems  Constitutional: Positive for fatigue (minimal). Negative for appetite change.  HENT: Positive for rhinorrhea. Negative for congestion, postnasal drip and sore throat.   Eyes: Negative.   Respiratory: Negative for cough, chest tightness and shortness of breath.   Cardiovascular: Positive for leg swelling (minimal edema at the end of the day). Negative for chest pain and palpitations.  Gastrointestinal: Negative for abdominal distention and abdominal pain.  Endocrine: Negative.   Genitourinary: Negative.   Musculoskeletal: Positive for back pain. Negative for neck pain.  Skin: Negative.   Allergic/Immunologic: Negative.   Neurological: Negative for dizziness and light-headedness.  Hematological: Negative for adenopathy. Bruises/bleeds easily.  Psychiatric/Behavioral: Negative for dysphoric mood and sleep disturbance (sleeping on 1 pillow ). The patient is not nervous/anxious.    Vitals:   01/27/20 1347  BP: (!) 152/81  Pulse: 77  Resp: 15  SpO2: 100%  Weight: 184 lb (83.5 kg)  Height: 5\' 5"  (1.651  m)   Wt Readings from Last 3 Encounters:  01/27/20 184 lb (83.5 kg)  01/23/20 186 lb 9.6 oz (84.6 kg)  10/11/19 187 lb (84.8 kg)   Lab Results  Component Value Date   CREATININE 1.88 (H) 01/23/2020   CREATININE 2.09 (H) 01/22/2020   CREATININE 2.32 (H) 01/21/2020    Physical Exam  Constitutional: She is oriented to person, place, and time. She appears well-developed and well-nourished.  HENT:  Head: Normocephalic and atraumatic.  Neck: No JVD present.  Cardiovascular: Normal rate. An irregular rhythm present.  Pulmonary/Chest: Effort normal. She has no wheezes. She has no rales.  Abdominal: Soft. She exhibits no distension. There is no abdominal tenderness.   Musculoskeletal:        General: Edema (1+ pitting edema in bilateral lower legs) present. No tenderness.     Cervical back: Normal range of motion and neck supple.     Comments: Dorsum of both hands are soft and puffy to touch  Neurological: She is [name] and oriented to person, place, and time.  Equal radial pulses bilaterally  Skin: Skin is warm and dry. No ecchymosis noted.  Psychiatric: She has a normal mood and affect. Her behavior is normal.  Nursing note and vitals reviewed.  Assessment & Plan:  1: Chronic heart failure with preserved ejection fraction without structural changes- - NYHA class II - euvolemic - is getting weighed but she's unsure if it's daily; order written for her to be weighed daily and to call for an overnight weight gain of >2 pounds or a weekly weight gain of >5 pounds - using Mrs. Dash and she was encouraged to continue following a 2000mg  sodium diet - saw cardiologist Margarito Courser) 01/01/20 - order written for compression socks to be put on every morning with removal at bedtime - getting PT services - also encouraged her to elevate her arms by propping on pillows; follow up with PCP at the facility if this doesn't improve - BNP 01/17/20 was 308.0 - PharmD reconciled medications   2: HTN- - BP mildly elevated today - follows with PCP Dorothy [name]) although now seeing PCP at the facility - BMP reviewed from 01/23/20 and shows sodium 140, potassium 4.1, creatinine 1.88 and GFR 24  3: Diabetes- - A1c 01/16/20 was 6.4%   Facility medication list was reviewed.   Return here in 3 months or sooner for any questions/problems before then.

## 2020-01-28 ENCOUNTER — Non-Acute Institutional Stay: Payer: Medicare Other | Admitting: Primary Care

## 2020-01-28 ENCOUNTER — Other Ambulatory Visit: Payer: Self-pay

## 2020-01-28 DIAGNOSIS — J9601 Acute respiratory failure with hypoxia: Secondary | ICD-10-CM

## 2020-01-28 DIAGNOSIS — I5032 Chronic diastolic (congestive) heart failure: Secondary | ICD-10-CM

## 2020-01-28 NOTE — Progress Notes (Signed)
[facility] Consult Note Telephone: [phone]  Fax: [phone]  PATIENT NAME: Dorothy [name] [address] [place] [address] [phone] (home)  DOB: [birth year] MRN: [id number]  PRIMARY CARE PROVIDER:    Letta Median, MD,  [place] [id number] [phone]  REFERRING PROVIDER:   Juluis Pitch, MD [address], [place],  [place] [address]  RESPONSIBLE PARTY:   Extended Emergency Contact Information Primary Emergency Contact: Lisabeth Devoid [place] Mobile Phone: [phone] Relation: Granddaughter Secondary Emergency Contact: Ray,Tiffany  [place] Phone: [phone] Relation: Granddaughter    I met with patient and family in the  Facility.Daughter was here for window visit, then was available by phone for discussion.  ASSESSMENT AND RECOMMENDATIONS:   1. Advance Care Planning/Goals of Care: Goals include to maximize quality of life and symptom management.   Our advance care planning conversation included a discussion about:    The value and importance of advance care planning   Exploration of personal, cultural or spiritual beliefs that might influence medical decisions   Exploration of goals of care in the event of a sudden injury or illness   Identification and preparation of a healthcare agent   DNR on file  2. Symptom Management:   Edema: Please review renal labs and indication for diuresis. She has significant pedal edema and in her arms. Permcath was placed but not going to dialysis now. For f/u with nephrology within the week. Labs also ordered but not available today, last GFR 5 days ago = 24.Recommend daily weights.  Medication teaching: Concerned about d/c of several drugs from previous. Education provided that renal function required metformin to d/c, and she was taken off some bp meds for hypotension  while in hospital.   Goals of care: Does not have a plan if she needs more care at home. We began this discussion today for possible care venues and will continue in future visits.   3. Family /Caregiver/Community Supports: Discussed goals of care with daughter [name] by phone. Discussed renal function and possible outcomes with permanent dialysis being possible. We will discuss again after nephrology f/u. Pt here for rehab, and has been living alone. She has a sister, older, with whom she may stay. Both daughters live out of state but a granddaughter is nearby who is a travelling DON and resource. We discussed getting medicaid applied for for home or facility use.  4. Cognitive / Functional decline: A and O x 3, physical decline from living (I) to needing help with ambulation, toileting and dressing.  5. Follow up Palliative Care Visit: Palliative care will continue to follow for goals of care clarification and symptom management. Return 2-4 weeks or prn.  I spent 45 minutes providing this consultation,  from 1500 to 1545. More than 50% of the time in this consultation was spent coordinating communication.   HISTORY OF PRESENT ILLNESS:  Dorothy [name] is a 85 y.o. year old female with multiple medical problems including CHF, renal failure, Edema, debility, fall risk. Palliative Care was asked to follow this patient by consultation request of Juluis Pitch, MD to help address advance care planning and goals of care. This is the initial visit.  CODE STATUS: DNR  PPS: 30% HOSPICE ELIGIBILITY/DIAGNOSIS: TBD  PAST MEDICAL HISTORY:  Past Medical History:  Diagnosis Date   Anemia    Arthritis    hands, knees, back  Atrial fibrillation ([facility])    CHF (congestive heart failure) ([facility])    COPD (chronic obstructive pulmonary disease) ([facility])    Diabetes mellitus without complication ([facility])    Dyspnea    GERD (gastroesophageal reflux disease)    Hyperlipidemia    Hypertension    PONV  (postoperative nausea and vomiting)    Sleep apnea    told she needs CPAP, does not have   Wears dentures    full upper and lower.  only wears upper    SOCIAL HX:  Social History   Tobacco Use   Smoking status: Never Smoker   Smokeless tobacco: Never Used  Substance Use Topics   Alcohol use: No    ALLERGIES:  Allergies  Allergen Reactions   Tramadol Anaphylaxis   Aspirin     "Stomach problems"     PERTINENT MEDICATIONS:  Outpatient Encounter Medications as of 01/28/2020  Medication Sig   acetaminophen (TYLENOL) 500 MG tablet Take 1,000 mg by mouth every 6 (six) hours as needed for moderate pain or headache.   albuterol (VENTOLIN HFA) 108 (90 Base) MCG/ACT inhaler Inhale 2 puffs into the lungs every 6 (six) hours as needed for wheezing or shortness of breath.   alendronate (FOSAMAX) 70 MG tablet Take 70 mg by mouth once a week.    apixaban (ELIQUIS) 2.5 MG TABS tablet Take 2.5 mg by mouth 2 (two) times daily.    atorvastatin (LIPITOR) 80 MG tablet Take 80 mg by mouth daily.   Calcium Carb-Cholecalciferol (CALCIUM 600 + D PO) Take 1 tablet by mouth daily.    cyanocobalamin (,VITAMIN B-12,) 1000 MCG/ML injection Inject 1,000 mcg into the muscle every 30 (thirty) days.    esomeprazole (NEXIUM) 20 MG capsule Take 20 mg by mouth at bedtime.   ferrous sulfate 325 (65 FE) MG tablet Take 325 mg by mouth daily with breakfast.   Ipratropium-Albuterol (COMBIVENT RESPIMAT) 20-100 MCG/ACT AERS respimat Inhale 1 puff into the lungs 3 (three) times daily.   ipratropium-albuterol (DUONEB) 0.5-2.5 (3) MG/3ML SOLN Take 3 mLs by nebulization 3 (three) times daily. (Patient not taking: Reported on 01/27/2020)   multivitamin (RENA-VIT) TABS tablet Take 1 tablet by mouth at bedtime.   Nutritional Supplements (FEEDING SUPPLEMENT, NEPRO CARB STEADY,) LIQD Take 237 mLs by mouth 2 (two) times daily between meals.   sodium chloride (OCEAN) 0.65 % SOLN nasal spray Place 1 spray into both  nostrils as needed for congestion.   vitamin B-12 (CYANOCOBALAMIN) 100 MCG tablet Take 100 mcg by mouth daily.   No facility-administered encounter medications on file as of 01/28/2020.    PHYSICAL EXAM / ROS:   Current and past weights: 186 lbs in 7/20; current weight is 191 lbs. General: NAD, frail appearing, obese Cardiovascular: S1S2 S3, no chest pain reported, 3+ LE edema , bil arms with edema Pulmonary: no cough, increased SOB,oxygen 3 L  CTA,  Abdomen: appetite fair,continent of bowel GU: denies dysuria, continent of urine MSK: + joint and ROM abnormalities, ambulatory with walker and standby Skin: no rashes or wounds reported Neurological: Weakness, denies pain, a and o x 3  Jason Coop, NP [facility]  COVID-19 PATIENT SCREENING TOOL  Person answering questions: ____________Staff_______ _____   1.  Is the patient or any family member in the home showing any signs or symptoms regarding respiratory infection?               Person with Symptom- __________NA_________________  a. Fever  Yes___ No___          ___________________  b. Shortness of breath                                                    Yes___ No___          ___________________ c. Cough/congestion                                       Yes___  No___         ___________________ d. Body aches/pains                                                         Yes___ No___        ____________________ e. Gastrointestinal symptoms (diarrhea, nausea)           Yes___ No___        ____________________  2. Within the past 14 days, has anyone living in the home had any contact with someone with or under investigation for COVID-19?    Yes___ No_X_   Person __________________

## 2020-01-30 ENCOUNTER — Inpatient Hospital Stay: Payer: Medicare Other | Attending: Oncology

## 2020-02-06 ENCOUNTER — Inpatient Hospital Stay: Payer: Medicare Other

## 2020-02-11 ENCOUNTER — Non-Acute Institutional Stay: Payer: Medicare Other | Admitting: Primary Care

## 2020-02-11 ENCOUNTER — Other Ambulatory Visit: Payer: Self-pay

## 2020-02-11 ENCOUNTER — Telehealth: Payer: Self-pay

## 2020-02-11 NOTE — Telephone Encounter (Signed)
Palliative Medicine RN Note: Rec'd a call on our team phone after hours last night from Mrs Thaker granddaughter [name] [phone]) asking to speak with NP Lexine Baton, who is out of the office until later this month.  I attempted to return her call, but got her VM. Left a message with our return number.  Marjie Skiff Telford Archambeau, RN, BSN, [facility] Palliative Medicine Team 02/11/2020 8:45 AM Office [phone]

## 2020-02-12 ENCOUNTER — Telehealth: Payer: Self-pay

## 2020-02-12 NOTE — Telephone Encounter (Signed)
Palliative Medicine RN Note: Rec'd a call from daughter [name]. She states that [facility] reports they have lost pt's AVS & no one knows when the patient's follow up appt is with renal or who the nephrologist is. I was able to look at the AVS and provide the name and phone number of the nephrologist.  Marjie Skiff. Elainah Rhyne, RN, BSN, [facility] Palliative Medicine Team 02/12/2020 11:13 AM Office [phone]

## 2020-02-13 ENCOUNTER — Inpatient Hospital Stay: Payer: Medicare Other | Attending: Oncology

## 2020-02-13 ENCOUNTER — Other Ambulatory Visit: Payer: Self-pay

## 2020-02-13 ENCOUNTER — Non-Acute Institutional Stay: Payer: Medicare Other | Admitting: Primary Care

## 2020-02-13 DIAGNOSIS — J9621 Acute and chronic respiratory failure with hypoxia: Secondary | ICD-10-CM

## 2020-02-13 DIAGNOSIS — Z515 Encounter for palliative care: Secondary | ICD-10-CM

## 2020-02-13 NOTE — Progress Notes (Signed)
° ° °[facility] °Community Palliative Care Consult Note °Telephone: [phone]  °Fax: [phone] ° °PATIENT NAME: Dorothy [name] °[address] °[address] °Dorothy [name] [address] °[phone] (home)  °DOB: 09/08/1935 °MRN: [id number] ° °PRIMARY CARE PROVIDER:    °Dr Bronstien [facility], Dorothy Mel[place]. ° °REFERRING PROVIDER:   °Dr Bronstien [facility], Dorothy [name]. ° °RESPONSIBLE PARTY:   Extended Emergency Contact Information °Primary Emergency Contact: Cobb,[name] ° [place] °Mobile Phone: [phone] °Relation: Granddaughter °Secondary Emergency Contact: Ray,Tiffany ° [place] °Home Phone: [phone] °Relation: Granddaughter ° °I met with patient in the facility. ° °ASSESSMENT AND RECOMMENDATIONS:  ° °· 1. Advance Care Planning/Goals of Care: Goals include to maximize quality of life and symptom management.MOST on file from SNF. Going home today and will need records in home. ° ° °2. Symptom Management:  °Renal function: Greatly improved. Has permcath which will be assessed for removal in a month.  ° °Mobility: Going to sister's for rehab. Expects home health next week. PC referral request to f/u with transition to sister 's then back to own home.  ° °3. Family /Caregiver/Community Supports:  ° Has granddaughter as POA. Going to live with sister. Needs home health, available support such as MOW if possible.  ° °4. Cognitive / Functional decline:  °A and O x 3. States she feels better but tentative about getting stronger with home PT. Able to ambulate with walker and stand by. ° °5. Follow up Palliative Care Visit: Palliative care will continue to follow for goals of care clarification and symptom management. Return 2-4 weeks or prn. ° °I spent 25 minutes providing this consultation,  from 1500 to 1525. More than 50% of the time in this consultation was spent coordinating communication.  ° °HISTORY OF PRESENT ILLNESS:  Dorothy [name] is a 85 y.o. year old female  with multiple medical problems including acute renal failure, CHF, immobility. Palliative Care was asked to follow this patient by consultation request of Dr Bronstien to help address advance care planning and goals of care. This is a follow up visit. ° °CODE STATUS: Full code, full scope of care ° °PPS: 40% ° °HOSPICE ELIGIBILITY/DIAGNOSIS: no ° °PAST MEDICAL HISTORY:  °Past Medical History:  °Diagnosis Date  °• Anemia   °• Arthritis   ° hands, knees, back  °• Atrial fibrillation (HCC)   °• CHF (congestive heart failure) (HCC)   °• COPD (chronic obstructive pulmonary disease) (HCC)   °• Diabetes mellitus without complication (HCC)   °• Dyspnea   °• GERD (gastroesophageal reflux disease)   °• Hyperlipidemia   °• Hypertension   °• PONV (postoperative nausea and vomiting)   °• Sleep apnea   ° told she needs CPAP, does not have  °• Wears dentures   ° full upper and lower.  only wears upper  °  °SOCIAL HX:  °Social History  ° °Tobacco Use  °• Smoking status: Never Smoker  °• Smokeless tobacco: Never Used  °Substance Use Topics  °• Alcohol use: No  ° ° °ALLERGIES:  °Allergies  °Allergen Reactions  °• Tramadol Anaphylaxis  °• Aspirin   °  "Stomach problems"  °   °PERTINENT MEDICATIONS:  °Outpatient Encounter Medications as of 02/13/2020  °Medication Sig  °• acetaminophen (TYLENOL) 500 MG tablet Take 1,000 mg by mouth every 6 (six) hours as needed for moderate pain or headache.  °• albuterol (VENTOLIN HFA) 108 (90 Base) MCG/ACT inhaler Inhale 2 puffs into the lungs   every 6 (six) hours as needed for wheezing or shortness of breath.  °• alendronate (FOSAMAX) 70 MG tablet Take 70 mg by mouth once a week.   °• apixaban (ELIQUIS) 2.5 MG TABS tablet Take 2.5 mg by mouth 2 (two) times daily.   °• atorvastatin (LIPITOR) 80 MG tablet Take 80 mg by mouth daily.  °• Calcium Carb-Cholecalciferol (CALCIUM 600 + D PO) Take 1 tablet by mouth daily.   °• cyanocobalamin (,VITAMIN B-12,) 1000 MCG/ML injection Inject 1,000 mcg into the muscle  every 30 (thirty) days.   °• esomeprazole (NEXIUM) 20 MG capsule Take 20 mg by mouth at bedtime.  °• ferrous sulfate 325 (65 FE) MG tablet Take 325 mg by mouth daily with breakfast.  °• Ipratropium-Albuterol (COMBIVENT RESPIMAT) 20-100 MCG/ACT AERS respimat Inhale 1 puff into the lungs 3 (three) times daily.  °• ipratropium-albuterol (DUONEB) 0.5-2.5 (3) MG/3ML SOLN Take 3 mLs by nebulization 3 (three) times daily. (Patient not taking: Reported on 01/27/2020)  °• multivitamin (RENA-VIT) TABS tablet Take 1 tablet by mouth at bedtime.  °• Nutritional Supplements (FEEDING SUPPLEMENT, NEPRO CARB STEADY,) LIQD Take 237 mLs by mouth 2 (two) times daily between meals.  °• sodium chloride (OCEAN) 0.65 % SOLN nasal spray Place 1 spray into both nostrils as needed for congestion.  °• vitamin B-12 (CYANOCOBALAMIN) 100 MCG tablet Take 100 mcg by mouth daily.  ° °No facility-administered encounter medications on file as of 02/13/2020.  ° ° °PHYSICAL EXAM / ROS:  ° °Current and past weights: stable °General: NAD, frail appearing, obese °Cardiovascular: no chest pain reported, 1 + edema  °Pulmonary: no cough, no increased SOB,  °Abdomen: appetite good, denies constipation, continent of bowel °GU: denies dysuria, continent of urine °MSK:  no joint and ROM abnormalities, ambulatory with walker °Skin: no rashes or wounds reported °Neurological: Weakness, but otherwise nonfocal ° °[name] McKelvey Smith, NP [facility] ° °COVID-19 PATIENT SCREENING TOOL ° °Person answering questions: ____________Staff_______ _____ °  °1.  Is the patient or any family member in the home showing any signs or symptoms regarding respiratory infection? ° °             Person with Symptom- __________NA_________________ ° °a. Fever                                                                          Yes___ No___          ___________________  °b. Shortness of breath                                                    Yes___ No___           ___________________ °c. Cough/congestion                                       Yes___  No___         ___________________ °d. Body aches/pains                                                           Yes___ No___        ____________________ e. Gastrointestinal symptoms (diarrhea, nausea)           Yes___ No___        ____________________  2. Within the past 14 days, has anyone living in the home had any contact with someone with or under investigation for COVID-19?    Yes___ No_X_   Person __________________  At sister's   For time being [facility] [address],[place] [phone] {Patsy Ronda)

## 2020-03-04 ENCOUNTER — Telehealth: Payer: Self-pay | Admitting: Nurse Practitioner

## 2020-03-04 NOTE — Telephone Encounter (Signed)
I have attempted to contact Ms. Wenk to schedule f/u PC home visit, no answer, message left with contact information

## 2020-03-16 ENCOUNTER — Telehealth (INDEPENDENT_AMBULATORY_CARE_PROVIDER_SITE_OTHER): Payer: Self-pay

## 2020-03-16 NOTE — Telephone Encounter (Signed)
Dorothy [name] called to get the patient scheduled for a permcath removal. Patient is scheduled with Dr. Lucky Cowboy on 03/18/20 with a 8:30 am arrival to the [facility]. Patient will do covid testing on 03/17/20 before 11:00 am at the [facility]. Pre-procedure instructions were discussed with Dorothy [name] for the patient and her daughter.

## 2020-03-17 ENCOUNTER — Other Ambulatory Visit: Payer: Self-pay

## 2020-03-17 ENCOUNTER — Other Ambulatory Visit (INDEPENDENT_AMBULATORY_CARE_PROVIDER_SITE_OTHER): Payer: Self-pay | Admitting: Nurse Practitioner

## 2020-03-17 ENCOUNTER — Other Ambulatory Visit
Admission: RE | Admit: 2020-03-17 | Discharge: 2020-03-17 | Disposition: A | Discharge status: Home / Self Care | Payer: Medicare Other | Source: Ambulatory Visit | Attending: Vascular Surgery | Admitting: Vascular Surgery

## 2020-03-17 DIAGNOSIS — Z01812 Encounter for preprocedural laboratory examination: Secondary | ICD-10-CM | POA: Diagnosis present

## 2020-03-17 DIAGNOSIS — Z20822 Contact with and (suspected) exposure to covid-19: Secondary | ICD-10-CM | POA: Diagnosis not present

## 2020-03-17 LAB — SARS CORONAVIRUS 2 (TAT 6-24 HRS): SARS Coronavirus 2: NEGATIVE

## 2020-03-18 ENCOUNTER — Encounter: Payer: Self-pay | Admitting: Vascular Surgery

## 2020-03-18 ENCOUNTER — Ambulatory Visit
Admission: RE | Admit: 2020-03-18 | Discharge: 2020-03-18 | Disposition: A | Discharge status: Home / Self Care | Payer: Medicare Other | Attending: Vascular Surgery | Admitting: Vascular Surgery

## 2020-03-18 ENCOUNTER — Encounter
Admission: RE | Disposition: A | Discharge status: Home / Self Care | Payer: Self-pay | Source: Home / Self Care | Attending: Vascular Surgery

## 2020-03-18 DIAGNOSIS — N186 End stage renal disease: Secondary | ICD-10-CM | POA: Insufficient documentation

## 2020-03-18 DIAGNOSIS — Z888 Allergy status to other drugs, medicaments and biological substances status: Secondary | ICD-10-CM | POA: Diagnosis not present

## 2020-03-18 DIAGNOSIS — Z885 Allergy status to narcotic agent status: Secondary | ICD-10-CM | POA: Insufficient documentation

## 2020-03-18 DIAGNOSIS — I509 Heart failure, unspecified: Secondary | ICD-10-CM | POA: Diagnosis not present

## 2020-03-18 DIAGNOSIS — Z992 Dependence on renal dialysis: Secondary | ICD-10-CM

## 2020-03-18 DIAGNOSIS — Z4901 Encounter for fitting and adjustment of extracorporeal dialysis catheter: Secondary | ICD-10-CM | POA: Insufficient documentation

## 2020-03-18 DIAGNOSIS — I4891 Unspecified atrial fibrillation: Secondary | ICD-10-CM | POA: Diagnosis not present

## 2020-03-18 DIAGNOSIS — E785 Hyperlipidemia, unspecified: Secondary | ICD-10-CM | POA: Insufficient documentation

## 2020-03-18 DIAGNOSIS — E11649 Type 2 diabetes mellitus with hypoglycemia without coma: Secondary | ICD-10-CM | POA: Insufficient documentation

## 2020-03-18 DIAGNOSIS — M1990 Unspecified osteoarthritis, unspecified site: Secondary | ICD-10-CM | POA: Diagnosis not present

## 2020-03-18 DIAGNOSIS — Z886 Allergy status to analgesic agent status: Secondary | ICD-10-CM | POA: Insufficient documentation

## 2020-03-18 DIAGNOSIS — J449 Chronic obstructive pulmonary disease, unspecified: Secondary | ICD-10-CM | POA: Insufficient documentation

## 2020-03-18 DIAGNOSIS — E1122 Type 2 diabetes mellitus with diabetic chronic kidney disease: Secondary | ICD-10-CM | POA: Insufficient documentation

## 2020-03-18 DIAGNOSIS — K219 Gastro-esophageal reflux disease without esophagitis: Secondary | ICD-10-CM | POA: Insufficient documentation

## 2020-03-18 DIAGNOSIS — I132 Hypertensive heart and chronic kidney disease with heart failure and with stage 5 chronic kidney disease, or end stage renal disease: Secondary | ICD-10-CM | POA: Insufficient documentation

## 2020-03-18 HISTORY — PX: DIALYSIS/PERMA CATHETER REMOVAL: CATH118289

## 2020-03-18 SURGERY — DIALYSIS/PERMA CATHETER REMOVAL
Anesthesia: LOCAL

## 2020-03-18 SURGICAL SUPPLY — 2 items
FORCEPS HALSTEAD CVD 5IN STRL (INSTRUMENTS) ×2 IMPLANT
TRAY LACERAT/PLASTIC (MISCELLANEOUS) ×2 IMPLANT

## 2020-03-18 NOTE — Discharge Instructions (Signed)
Tunneled Catheter Removal, Care After Refer to this sheet in the next few weeks. These instructions provide you with information about caring for yourself after your procedure. Your health care provider may also give you more specific instructions. Your treatment has been planned according to current medical practices, but problems sometimes occur. Call your health care provider if you have any problems or questions after your procedure. What can I expect after the procedure? After the procedure, it is common to have: Some mild redness, swelling, and pain around your catheter site.   Follow these instructions at home: Incision care  Check your removal site  every day for signs of infection. Check for: More redness, swelling, or pain. More fluid or blood. Warmth. Pus or a bad smell. Remove your dressing in 48hrs leave open to air  Activity  Return to your normal activities as told by your health care provider. Ask your health care provider what activities are safe for you. Do not lift anything that is heavier than 10 lb (4.5 kg) for 3 days  You may shower tomorrow  Contact a health care provider if: You have more fluid or blood coming from your removal site You have more redness, swelling, or pain at your incisions or around the area where your catheter was removed Your removal site feel warm to the touch. You feel unusually weak. You feel nauseous.. Get help right away if You have swelling in your arm, shoulder, neck, or face. You develop chest pain. You have difficulty breathing. You feel dizzy or light-headed. You have pus or a bad smell coming from your removal site You have a fever. You develop bleeding from your removal site, and your bleeding does not stop. This information is not intended to replace advice given to you by your health care provider. Make sure you discuss any questions you have with your health care provider. Document Released: 08/14/2012 Document Revised:  04/30/2016 Document Reviewed: 05/24/2015 Elsevier Interactive Patient Education  2017 Elsevier Inc. 

## 2020-03-18 NOTE — Op Note (Signed)
Operative Note  Preoperative diagnosis:    1. ESRD with functional permanent access  Postoperative diagnosis:   1. ESRD with functional permanent access  Procedure:   Removal of Right Permcath  Performing Clinician: Hezzie Bump PA-C  Supervising Surgeon: Leotis Pain, MD  Anesthesia:  Local  EBL:  Minimal  Indication for the Procedure:  The patient has a functional permanent dialysis access and no longer needs their permcath.  This can be removed.  Risks and benefits are discussed and informed consent is obtained.  Description of the Procedure:  The patient's right neck, chest and existing catheter were sterilely prepped and draped. The area around the catheter was anesthetized copiously with 1% lidocaine. The catheter was dissected out with curved hemostats until the cuff was freed from the surrounding fibrous sheath. The fiber sheath was transected, and the catheter was then removed in its entirety using gentle traction. Pressure was held and sterile dressings were placed. The patient tolerated the procedure well and was taken to the recovery room in stable condition.  Dorothy [name] A [name]  03/18/2020, 10:40 AM  This note was created with Dragon Medical transcription system. Any errors in dictation are purely unintentional.

## 2020-03-18 NOTE — H&P (Signed)
[facility] Admission History & Physical  MRN : [id number]  Dorothy [name] is a 85 y.o. (1934-12-07) female who presents with chief complaint of scheduled PermCath removal.  History of Present Illness:  I am asked to evaluate the patient by the dialysis center. The patient was sent here because she has a functioning dialysis access is no longer in need of her PermCath. The patient reports they're not been any problems with any of their dialysis runs. They are reporting good flows with good parameters at dialysis. Patient denies pain or tenderness overlying the access.  There is no pain with dialysis.  The patient denies hand pain or finger pain consistent with steal syndrome.  No fevers or chills while on dialysis.  No current facility-administered medications for this encounter.   Past Medical History:  Diagnosis Date  . Anemia   . Arthritis    hands, knees, back  . Atrial fibrillation ([place])   . CHF (congestive heart failure) ([place])   . COPD (chronic obstructive pulmonary disease) ([place])   . Diabetes mellitus without complication ([place])   . Dyspnea   . GERD (gastroesophageal reflux disease)   . Hyperlipidemia   . Hypertension   . PONV (postoperative nausea and vomiting)   . Sleep apnea    told she needs CPAP, does not have  . Wears dentures    full upper and lower.  only wears upper   Past Surgical History:  Procedure Laterality Date  . ABDOMINAL HYSTERECTOMY    . CATARACT EXTRACTION W/PHACO Right 07/12/2016   Procedure: CATARACT EXTRACTION PHACO AND INTRAOCULAR LENS PLACEMENT (IOC);  Surgeon: Leandrew Koyanagi, MD;  Location: [facility];  Service: Ophthalmology;  Laterality: Right;  DIABETIC - insulin and oral meds sleep apnea  . CATARACT EXTRACTION W/PHACO Left 04/09/2019   Procedure: CATARACT EXTRACTION PHACO AND INTRAOCULAR LENS PLACEMENT ([place])  LEFT DIABETIC;  Surgeon: Leandrew Koyanagi, MD;  Location: [facility];  Service:  Ophthalmology;  Laterality: Left;  Diabetic - insulin and oral meds  . CHOLECYSTECTOMY    . COLONOSCOPY N/A 01/19/2015   Procedure: COLONOSCOPY;  Surgeon: Josefine Class, MD;  Location: [facility] ENDOSCOPY;  Service: Endoscopy;  Laterality: N/A;  . COLONOSCOPY WITH PROPOFOL N/A 07/11/2017   Procedure: COLONOSCOPY WITH PROPOFOL;  Surgeon: Toledo, Benay Pike, MD;  Location: [facility] ENDOSCOPY;  Service: Gastroenterology;  Laterality: N/A;  . DIALYSIS/PERMA CATHETER INSERTION N/A 01/19/2020   Procedure: DIALYSIS/PERMA CATHETER INSERTION;  Surgeon: Algernon Huxley, MD;  Location: [facility] CV LAB;  Service: Cardiovascular;  Laterality: N/A;  . ESOPHAGOGASTRODUODENOSCOPY N/A 01/19/2015   Procedure: ESOPHAGOGASTRODUODENOSCOPY (EGD);  Surgeon: Josefine Class, MD;  Location: [facility] ENDOSCOPY;  Service: Endoscopy;  Laterality: N/A;  . ESOPHAGOGASTRODUODENOSCOPY (EGD) WITH PROPOFOL N/A 07/11/2017   Procedure: ESOPHAGOGASTRODUODENOSCOPY (EGD) WITH PROPOFOL;  Surgeon: Toledo, Benay Pike, MD;  Location: [facility] ENDOSCOPY;  Service: Gastroenterology;  Laterality: N/A;  . SHOULDER ARTHROSCOPY     Social History Social History   Tobacco Use  . Smoking status: Never Smoker  . Smokeless tobacco: Never Used  Vaping Use  . Vaping Use: Never used  Substance Use Topics  . Alcohol use: No  . Drug use: Not on file   Family History No family history on file.   No family history of bleeding or clotting disorders, autoimmune disease or porphyria  Allergies  Allergen Reactions  . Tramadol Anaphylaxis  . Aspirin     "Stomach problems"   REVIEW OF SYSTEMS (Negative unless checked)  Constitutional: [] Weight  loss  [] Fever  [] Chills Cardiac: [] Chest pain   [] Chest pressure   [] Palpitations   [] Shortness of breath when laying flat   [] Shortness of breath at rest   [x] Shortness of breath with exertion. Vascular:  [] Pain in legs with walking   [] Pain in legs at rest   [] Pain in legs when laying flat   [] Claudication    [] Pain in feet when walking  [] Pain in feet at rest  [] Pain in feet when laying flat   [] History of DVT   [] Phlebitis   [] Swelling in legs   [] Varicose veins   [] Non-healing ulcers Pulmonary:   [] Uses home oxygen   [] Productive cough   [] Hemoptysis   [] Wheeze  [] COPD   [] Asthma Neurologic:  [] Dizziness  [] Blackouts   [] Seizures   [] History of stroke   [] History of TIA  [] Aphasia   [] Temporary blindness   [] Dysphagia   [] Weakness or numbness in arms   [] Weakness or numbness in legs Musculoskeletal:  [x] Arthritis   [] Joint swelling   [] Joint pain   [] Low back pain Hematologic:  [] Easy bruising  [] Easy bleeding   [] Hypercoagulable state   [x] Anemic  [] Hepatitis Gastrointestinal:  [] Blood in stool   [] Vomiting blood  [] Gastroesophageal reflux/heartburn   [] Difficulty swallowing. Genitourinary:  [x] Chronic kidney disease   [] Difficult urination  [] Frequent urination  [] Burning with urination   [] Blood in urine Skin:  [] Rashes   [] Ulcers   [] Wounds Psychological:  [] History of anxiety   []  History of major depression.  Physical Examination  There were no vitals filed for this visit. There is no height or weight on file to calculate BMI.  Gen: WD/WN, NAD Head: [place]/AT, No temporalis wasting. Prominent temp pulse not noted. Ear/Nose/Throat: Hearing grossly intact, nares w/o erythema or drainage, oropharynx w/o Erythema/Exudate,  Eyes: Conjunctiva clear, sclera non-icteric Neck: Trachea midline.  No JVD.  Pulmonary:  Good air movement, respirations not labored, no use of accessory muscles.  Cardiac: RRR, normal S1, S2. Vascular:  Vessel Right Left  Radial Palpable Palpable  Ulnar Not Palpable Not Palpable  Brachial Palpable Palpable  Carotid Palpable, without bruit Palpable, without bruit   Dialysis access: Good bruit and thrill.  Gastrointestinal: soft, non-tender/non-distended. No guarding/reflex.  Musculoskeletal: M/S 5/5 throughout.  Extremities without ischemic changes.  No deformity or  atrophy.  Neurologic: Sensation grossly intact in extremities.  Symmetrical.  Speech is fluent. Motor exam as listed above. Psychiatric: Judgment intact, Mood & affect appropriate for pt's clinical situation. Dermatologic: No rashes or ulcers noted.  No cellulitis or open wounds. Lymph : No Cervical, Axillary, or Inguinal lymphadenopathy.  CBC Lab Results  Component Value Date   WBC 5.0 01/23/2020   HGB 8.2 (L) 01/23/2020   HCT 26.8 (L) 01/23/2020   MCV 85.1 01/23/2020   PLT 106 (L) 01/23/2020   BMET    Component Value Date/Time   NA 140 01/23/2020 0531   NA 141 01/09/2015 0542   K 4.1 01/23/2020 0531   K 4.3 01/09/2015 0542   CL 102 01/23/2020 0531   CL 104 01/09/2015 0542   CO2 30 01/23/2020 0531   CO2 30 01/09/2015 0542   GLUCOSE 137 (H) 01/23/2020 0531   GLUCOSE 142 (H) 01/09/2015 0542   BUN 29 (H) 01/23/2020 0531   BUN 17 01/09/2015 0542   CREATININE 1.88 (H) 01/23/2020 0531   CREATININE 1.33 (H) 01/09/2015 0542   CALCIUM 8.4 (L) 01/23/2020 0531   CALCIUM 9.2 01/09/2015 0542   GFRNONAA 24 (L) 01/23/2020 0531   GFRNONAA  38 (L) 01/09/2015 0542   GFRAA 28 (L) 01/23/2020 0531   GFRAA 45 (L) 01/09/2015 0542   CrCl cannot be calculated (Patient's most recent lab result is older than the maximum 21 days allowed.).  COAG No results found for: INR, PROTIME  Radiology No results found.  Assessment/Plan The patient is an 85 year old female with multiple medical issues including known end-stage renal disease on hemodialysis who presents with a functioning dialysis access no longer needs her PermCath.  1.  Complication dialysis device with thrombosis AV access:  The patient has an extremity access that is functioning well. Therefore, the patient will undergo removal of the tunneled catheter under local anesthesia.  The risks and benefits were described to the patient.  All questions were answered.  The patient agrees to proceed with angiography and intervention. Potassium  will be drawn to ensure that it is an appropriate level prior to performing intervention.  2.  End-stage renal disease requiring hemodialysis:  Patient will continue dialysis therapy without further interruption if a successful intervention is not achieved then a tunneled catheter will be placed. Dialysis has already been arranged.  3.  Hypertension:  Patient will continue medical management; nephrology is following no changes in oral medications.  4. Diabetes mellitus:  Glucose will be monitored and oral medications been held this morning once the patient has undergone the patient's procedure po intake will be reinitiated and again Accu-Cheks will be used to assess the blood glucose level and treat as needed. The patient will be restarted on the patient's usual hypoglycemic regime  Discussed with Dr. Mayme Genta, PA-C  03/18/2020 9:22 AM

## 2020-04-02 ENCOUNTER — Inpatient Hospital Stay: Payer: Medicare Other

## 2020-04-02 ENCOUNTER — Inpatient Hospital Stay: Payer: Medicare Other | Admitting: Oncology

## 2020-04-23 ENCOUNTER — Inpatient Hospital Stay: Payer: Medicare Other | Admitting: Oncology

## 2020-04-23 ENCOUNTER — Inpatient Hospital Stay: Payer: Medicare Other

## 2020-04-27 ENCOUNTER — Other Ambulatory Visit: Payer: Medicare Other | Admitting: Nurse Practitioner

## 2020-04-27 ENCOUNTER — Other Ambulatory Visit: Payer: Self-pay

## 2020-04-27 ENCOUNTER — Telehealth: Payer: Self-pay | Admitting: Nurse Practitioner

## 2020-04-27 NOTE — Telephone Encounter (Signed)
I called Ms.Picone about f/u pc visit today to confirm appointment and covid screen. Ms. Barrales endorses she just got up, was not feeling up to a visit today and requested to reschedule. Ms. Kochel has moved back to her home in [place] which explained to Ms. Porcher that with new address will be back in Ralene Bathe NP area, Ralene Bathe NP saw her during rehab. Ms. Ketcherside in agreement to have Ralene Bathe NP call to reschedule when feeling better. Contact information provided.

## 2020-04-28 ENCOUNTER — Ambulatory Visit: Payer: Medicare Other | Admitting: Family

## 2020-05-06 ENCOUNTER — Telehealth: Payer: Self-pay | Admitting: Primary Care

## 2020-05-06 NOTE — Telephone Encounter (Signed)
Called to offer home PC following. Message left.

## 2020-05-07 ENCOUNTER — Inpatient Hospital Stay: Payer: Medicare Other | Admitting: Oncology

## 2020-05-07 ENCOUNTER — Inpatient Hospital Stay: Payer: Medicare Other | Attending: Oncology

## 2020-05-07 ENCOUNTER — Other Ambulatory Visit: Payer: Self-pay

## 2020-05-07 ENCOUNTER — Inpatient Hospital Stay (HOSPITAL_BASED_OUTPATIENT_CLINIC_OR_DEPARTMENT_OTHER): Payer: Medicare Other | Admitting: Hospice and Palliative Medicine

## 2020-05-07 ENCOUNTER — Encounter: Payer: Self-pay | Admitting: Oncology

## 2020-05-07 VITALS — BP 144/63 | HR 71 | Temp 97.8°F | Resp 16 | Ht 65.0 in | Wt 158.4 lb

## 2020-05-07 DIAGNOSIS — D509 Iron deficiency anemia, unspecified: Secondary | ICD-10-CM | POA: Diagnosis not present

## 2020-05-07 DIAGNOSIS — E538 Deficiency of other specified B group vitamins: Secondary | ICD-10-CM | POA: Diagnosis not present

## 2020-05-07 DIAGNOSIS — Z515 Encounter for palliative care: Secondary | ICD-10-CM

## 2020-05-07 DIAGNOSIS — Z7189 Other specified counseling: Secondary | ICD-10-CM | POA: Diagnosis not present

## 2020-05-07 DIAGNOSIS — N189 Chronic kidney disease, unspecified: Secondary | ICD-10-CM | POA: Diagnosis not present

## 2020-05-07 LAB — FERRITIN: Ferritin: 48 ng/mL (ref 11–307)

## 2020-05-07 LAB — VITAMIN B12: Vitamin B-12: 1135 pg/mL — ABNORMAL HIGH (ref 180–914)

## 2020-05-07 LAB — IRON AND TIBC
Iron: 42 ug/dL (ref 28–170)
Saturation Ratios: 17 % (ref 10.4–31.8)
TIBC: 248 ug/dL — ABNORMAL LOW (ref 250–450)
UIBC: 206 ug/dL

## 2020-05-07 LAB — CBC WITH DIFFERENTIAL/PLATELET
Abs Immature Granulocytes: 0.04 10*3/uL (ref 0.00–0.07)
Basophils Absolute: 0 10*3/uL (ref 0.0–0.1)
Basophils Relative: 1 %
Eosinophils Absolute: 0.2 10*3/uL (ref 0.0–0.5)
Eosinophils Relative: 2 %
HCT: 31.8 % — ABNORMAL LOW (ref 36.0–46.0)
Hemoglobin: 10.2 g/dL — ABNORMAL LOW (ref 12.0–15.0)
Immature Granulocytes: 1 %
Lymphocytes Relative: 17 %
Lymphs Abs: 1.4 10*3/uL (ref 0.7–4.0)
MCH: 27.9 pg (ref 26.0–34.0)
MCHC: 32.1 g/dL (ref 30.0–36.0)
MCV: 87.1 fL (ref 80.0–100.0)
Monocytes Absolute: 0.7 10*3/uL (ref 0.1–1.0)
Monocytes Relative: 8 %
Neutro Abs: 6 10*3/uL (ref 1.7–7.7)
Neutrophils Relative %: 71 %
Platelets: 271 10*3/uL (ref 150–400)
RBC: 3.65 MIL/uL — ABNORMAL LOW (ref 3.87–5.11)
RDW: 14.4 % (ref 11.5–15.5)
WBC: 8.3 10*3/uL (ref 4.0–10.5)
nRBC: 0 % (ref 0.0–0.2)

## 2020-05-07 NOTE — Progress Notes (Signed)
[place]  Telephone:([phone] [phone]   Name: Dorothy [name] Date: 05/07/2020 MRN: [id number]  DOB: Oct 10, 1934  Patient Care Team: Letta Median, MD as PCP - General (Family Medicine) Alisa Graff, FNP as Nurse Practitioner (Family Medicine) Isaias Cowman, MD as Consulting Physician (Cardiology) Sindy Guadeloupe, MD as Consulting Physician (Oncology) Erby Pian, MD as Referring Physician (Specialist) Jason Coop, NP as Nurse Practitioner    REASON FOR CONSULTATION: Dorothy [name] is a 85 y.o. female with multiple medical problems including COPD on chronic O2, CKD stage IIIb and previously required dialysis, iron deficiency, B12 deficiency anemia.  Patient was hospitalized 5/6/201-01/23/2020 with multifocal pneumonia.  Palliative care was consulted help address advanced care planning.  SOCIAL HISTORY:     reports that she has never smoked. She has never used smokeless tobacco. She reports that she does not drink alcohol and does not use drugs.  Patient is widowed.  She has 3 children who are involved.  ADVANCE DIRECTIVES:  Does not have  CODE STATUS:   PAST MEDICAL HISTORY: Past Medical History:  Diagnosis Date  . Anemia   . Arthritis    hands, knees, back  . Atrial fibrillation ([place])   . CHF (congestive heart failure) ([place])   . COPD (chronic obstructive pulmonary disease) ([place])   . Diabetes mellitus without complication ([place])   . Dyspnea   . GERD (gastroesophageal reflux disease)   . Hyperlipidemia   . Hypertension   . PONV (postoperative nausea and vomiting)   . Sleep apnea    told she needs CPAP, does not have  . Wears dentures    full upper and lower.  only wears upper    PAST SURGICAL HISTORY:  Past Surgical History:  Procedure Laterality Date  . ABDOMINAL HYSTERECTOMY    . CATARACT EXTRACTION W/PHACO Right 07/12/2016   Procedure: CATARACT EXTRACTION PHACO AND  INTRAOCULAR LENS PLACEMENT (IOC);  Surgeon: Leandrew Koyanagi, MD;  Location: [place];  Service: Ophthalmology;  Laterality: Right;  DIABETIC - insulin and oral meds sleep apnea  . CATARACT EXTRACTION W/PHACO Left 04/09/2019   Procedure: CATARACT EXTRACTION PHACO AND INTRAOCULAR LENS PLACEMENT ([place])  LEFT DIABETIC;  Surgeon: Leandrew Koyanagi, MD;  Location: [place];  Service: Ophthalmology;  Laterality: Left;  Diabetic - insulin and oral meds  . CHOLECYSTECTOMY    . COLONOSCOPY N/A 01/19/2015   Procedure: COLONOSCOPY;  Surgeon: Josefine Class, MD;  Location: [facility] ENDOSCOPY;  Service: Endoscopy;  Laterality: N/A;  . COLONOSCOPY WITH PROPOFOL N/A 07/11/2017   Procedure: COLONOSCOPY WITH PROPOFOL;  Surgeon: Toledo, Benay Pike, MD;  Location: [facility] ENDOSCOPY;  Service: Gastroenterology;  Laterality: N/A;  . DIALYSIS/PERMA CATHETER INSERTION N/A 01/19/2020   Procedure: DIALYSIS/PERMA CATHETER INSERTION;  Surgeon: Algernon Huxley, MD;  Location: [place] CV LAB;  Service: Cardiovascular;  Laterality: N/A;  . DIALYSIS/PERMA CATHETER REMOVAL N/A 03/18/2020   Procedure: DIALYSIS/PERMA CATHETER REMOVAL;  Surgeon: Algernon Huxley, MD;  Location: [place] CV LAB;  Service: Cardiovascular;  Laterality: N/A;  . ESOPHAGOGASTRODUODENOSCOPY N/A 01/19/2015   Procedure: ESOPHAGOGASTRODUODENOSCOPY (EGD);  Surgeon: Josefine Class, MD;  Location: [facility] ENDOSCOPY;  Service: Endoscopy;  Laterality: N/A;  . ESOPHAGOGASTRODUODENOSCOPY (EGD) WITH PROPOFOL N/A 07/11/2017   Procedure: ESOPHAGOGASTRODUODENOSCOPY (EGD) WITH PROPOFOL;  Surgeon: Toledo, Benay Pike, MD;  Location: [facility] ENDOSCOPY;  Service: Gastroenterology;  Laterality: N/A;  . SHOULDER ARTHROSCOPY      HEMATOLOGY/ONCOLOGY HISTORY:  Oncology History  No history exists.    ALLERGIES:  is allergic to tramadol and aspirin.  MEDICATIONS:  Current Outpatient Medications  Medication Sig Dispense Refill  . acetaminophen  (TYLENOL) 500 MG tablet Take 1,000 mg by mouth every 6 (six) hours as needed for moderate pain or headache.    . albuterol (VENTOLIN HFA) 108 (90 Base) MCG/ACT inhaler Inhale 2 puffs into the lungs every 6 (six) hours as needed for wheezing or shortness of breath. 6.7 g 3  . alendronate (FOSAMAX) 70 MG tablet Take 70 mg by mouth once a week.     Marland Kitchen apixaban (ELIQUIS) 2.5 MG TABS tablet Take 2.5 mg by mouth 2 (two) times daily.     Marland Kitchen atorvastatin (LIPITOR) 80 MG tablet Take 80 mg by mouth daily.    . Calcium Carb-Cholecalciferol (CALCIUM 600 + D PO) Take 1 tablet by mouth in the morning and at bedtime.     Marland Kitchen esomeprazole (NEXIUM) 20 MG capsule Take 20 mg by mouth at bedtime.    . ferrous sulfate 325 (65 FE) MG tablet Take 325 mg by mouth daily with breakfast.    . Ipratropium-Albuterol (COMBIVENT RESPIMAT) 20-100 MCG/ACT AERS respimat Inhale 1 puff into the lungs 3 (three) times daily.    Marland Kitchen ipratropium-albuterol (DUONEB) 0.5-2.5 (3) MG/3ML SOLN Take 3 mLs by nebulization 3 (three) times daily. (Patient taking differently: Take 3 mLs by nebulization 3 (three) times daily as needed. ) 360 mL 3  . multivitamin (RENA-VIT) TABS tablet Take 1 tablet by mouth at bedtime. 30 tablet 0  . sodium chloride (OCEAN) 0.65 % SOLN nasal spray Place 1 spray into both nostrils as needed for congestion. 15 mL 0  . vitamin B-12 (CYANOCOBALAMIN) 100 MCG tablet Take 100 mcg by mouth daily.     No current facility-administered medications for this visit.    VITAL SIGNS: There were no vitals taken for this visit. There were no vitals filed for this visit.  Estimated body mass index is 26.37 kg/m as calculated from the following:   Height as of an earlier encounter on 05/07/20: 5\' 5"  (1.651 m).   Weight as of an earlier encounter on 05/07/20: 158 lb 7 oz (71.9 kg).  LABS: CBC:    Component Value Date/Time   WBC 8.3 05/07/2020 0902   HGB 10.2 (L) 05/07/2020 0902   HGB 8.4 (L) 01/09/2015 0542   HCT 31.8 (L)  05/07/2020 0902   HCT 26.8 (L) 01/09/2015 0542   PLT 271 05/07/2020 0902   PLT 205 01/09/2015 0542   MCV 87.1 05/07/2020 0902   MCV 73 (L) 01/09/2015 0542   NEUTROABS 6.0 05/07/2020 0902   NEUTROABS 4.7 01/09/2015 0542   LYMPHSABS 1.4 05/07/2020 0902   LYMPHSABS 1.4 01/09/2015 0542   MONOABS 0.7 05/07/2020 0902   MONOABS 0.5 01/09/2015 0542   EOSABS 0.2 05/07/2020 0902   EOSABS 0.1 01/09/2015 0542   BASOSABS 0.0 05/07/2020 0902   BASOSABS 0.0 01/09/2015 0542   Comprehensive Metabolic Panel:    Component Value Date/Time   NA 140 01/23/2020 0531   NA 141 01/09/2015 0542   K 4.1 01/23/2020 0531   K 4.3 01/09/2015 0542   CL 102 01/23/2020 0531   CL 104 01/09/2015 0542   CO2 30 01/23/2020 0531   CO2 30 01/09/2015 0542   BUN 29 (H) 01/23/2020 0531   BUN 17 01/09/2015 0542   CREATININE 1.88 (H) 01/23/2020 0531   CREATININE 1.33 (H) 01/09/2015 0542   GLUCOSE 137 (H) 01/23/2020 0531  GLUCOSE 142 (H) 01/09/2015 0542   CALCIUM 8.4 (L) 01/23/2020 0531   CALCIUM 9.2 01/09/2015 0542   AST 15 01/15/2020 1847   AST 24 12/31/2014 1248   ALT 15 01/15/2020 1847   ALT 16 12/31/2014 1248   ALKPHOS 79 01/15/2020 1847   ALKPHOS 76 12/31/2014 1248   BILITOT 0.9 01/15/2020 1847   BILITOT 0.4 12/31/2014 1248   PROT 6.4 (L) 01/15/2020 1847   PROT 7.0 12/31/2014 1248   ALBUMIN 3.3 (L) 01/15/2020 1847   ALBUMIN 3.9 12/31/2014 1248    RADIOGRAPHIC STUDIES: No results found.  PERFORMANCE STATUS (ECOG) : 3 - Symptomatic, >50% confined to bed  Review of Systems Unless otherwise noted, a complete review of systems is negative.  Physical Exam General: NAD Pulmonary: Unlabored Extremities: no edema, no joint deformities Skin: no rashes Neurological: Weakness but otherwise nonfocal  IMPRESSION: Patient was an add-on to my clinic schedule today to address advanced care planning.  Patient was seen by Dr. Janese Banks.  It appears that patient was hospitalized earlier this year (May 2021) and at  that time had consultation with palliative care with CODE STATUS changed to DNR/DNI.  A DNR order is present in [place].  However, patient also has a MOST form signed on date of discharge indicating full code and full scope of treatment.  Patient's granddaughter, who accompanied patient to the clinic visit today, states that she is confused as she thought patient and family had all agreed to DNR/DNI.  She says that she has had a recent conversation with 1 of patient's daughters, who is a Marine scientist, and thought that family still wanted DNR/DNI.  I offered to complete a new MOST form today but patient wanted to take this home and discuss more thoroughly as a family prior to making any final decisions.  Also sent her home with ACP documents.  If patient confirms DNR/DNI and wants to complete a new MOST form, I would be happy to assist with this.  The old MOST form should be voided and removed from [facility].   PLAN: -MOST form and ACP documents reviewed -Family to consider decision making   Patient expressed understanding and was in agreement with this plan. She also understands that She can call the clinic at any time with any questions, concerns, or complaints.     Time Total: 15 minutes  Visit consisted of counseling and education dealing with the complex and emotionally intense issues of symptom management and palliative care in the setting of serious and potentially life-threatening illness.Greater than 50%  of this time was spent counseling and coordinating care related to the above assessment and plan.  Signed by: Altha Harm, PhD, NP-C

## 2020-05-07 NOTE — Progress Notes (Signed)
Pt feeling much better than in hospital in may. She lives by herself with family checking on her. She eats good about 4 days a week and snacks the other 3 days. She drinks good every day. Bowels work good. She does not have any depression. Pt has most form and it says to give her cpr and put her on ventilation and that was not what the family wished to do, I have asked Josh to see them and they can have a new form given to them to take home if they would like

## 2020-05-07 NOTE — Progress Notes (Signed)
Hematology/Oncology Consult note [facility]  Telephone:([phone] [phone]  Patient Care Team: Letta Median, MD as PCP - General (Family Medicine) Alisa Graff, FNP as Nurse Practitioner (Family Medicine) Isaias Cowman, MD as Consulting Physician (Cardiology) Sindy Guadeloupe, MD as Consulting Physician (Oncology) Erby Pian, MD as Referring Physician (Specialist) Jason Coop, NP as Nurse Practitioner   Name of the patient: Dorothy [name]  [id number]  1934/11/26   Date of visit: 05/07/20  Diagnosis- iron deficiencyand b12 deficiencyanemia   Chief complaint/ Reason for visit-routine follow-up of anemia  Heme/Onc history:patient is 85 year old female who was seen by Dr. Ouida Sills 2016 deficiency anemia. At that time she had an EGD and a colonoscopy which did not reveal any evidence of bleeding. She didn't get better with oral iron and was eventually asked to follow-up with her primary care doctor.She does have a remote h/o Billroth I gastro duodenostomy  Patient was admitted to the hospital for severe allergy angioedema and was found to have a pneumonia and heart failurein April 2018. Her hemoglobin was 7.3 with an MCV of 68. She has received 2 doses ofvenofer200 mg along with one unit of blood transfusion. She was seen by Dr. Alice Reichert and underwent EGD and colonoscopy in October 2018. EGD showed evidence of billroth I but ws otherwise normal. Colonoscopy showed diverticulosis, internal and external hemorrhoids and 1 polyp (tubular adenoma). No active bleeding  Iron studies from 12/19/2016 showed a low serum iron of 13 and low iron saturation of 3% and normal TIBC of 426. Ferritin was low at 9 and B12 was normal at 151. Patient has receivedvenofer and B12 injections inthe past  Interval history-she is doing well for her age.  Recovered from hospitalization in May 2021 and kidney numbers are improving.  She  follows up with nephrology.  Reports chronic fatigue.  She is not vaccinated against Covid.  ECOG PS- 2 Pain scale- 0   Review of systems- Review of Systems  Constitutional: Positive for malaise/fatigue. Negative for chills, fever and weight loss.  HENT: Negative for congestion, ear discharge and nosebleeds.   Eyes: Negative for blurred vision.  Respiratory: Negative for cough, hemoptysis, sputum production, shortness of breath and wheezing.   Cardiovascular: Negative for chest pain, palpitations, orthopnea and claudication.  Gastrointestinal: Negative for abdominal pain, blood in stool, constipation, diarrhea, heartburn, melena, nausea and vomiting.  Genitourinary: Negative for dysuria, flank pain, frequency, hematuria and urgency.  Musculoskeletal: Negative for back pain, joint pain and myalgias.  Skin: Negative for rash.  Neurological: Negative for dizziness, tingling, focal weakness, seizures, weakness and headaches.  Endo/Heme/Allergies: Does not bruise/bleed easily.  Psychiatric/Behavioral: Negative for depression and suicidal ideas. The patient does not have insomnia.       Allergies  Allergen Reactions  . Tramadol Anaphylaxis  . Aspirin     "Stomach problems"     Past Medical History:  Diagnosis Date  . Anemia   . Arthritis    hands, knees, back  . Atrial fibrillation ([place])   . CHF (congestive heart failure) ([place])   . COPD (chronic obstructive pulmonary disease) ([place])   . Diabetes mellitus without complication ([place])   . Dyspnea   . GERD (gastroesophageal reflux disease)   . Hyperlipidemia   . Hypertension   . PONV (postoperative nausea and vomiting)   . Sleep apnea    told she needs CPAP, does not have  . Wears dentures    full upper and lower.  only  wears upper     Past Surgical History:  Procedure Laterality Date  . ABDOMINAL HYSTERECTOMY    . CATARACT EXTRACTION W/PHACO Right 07/12/2016   Procedure: CATARACT EXTRACTION PHACO AND INTRAOCULAR LENS  PLACEMENT (IOC);  Surgeon: Leandrew Koyanagi, MD;  Location: [place];  Service: Ophthalmology;  Laterality: Right;  DIABETIC - insulin and oral meds sleep apnea  . CATARACT EXTRACTION W/PHACO Left 04/09/2019   Procedure: CATARACT EXTRACTION PHACO AND INTRAOCULAR LENS PLACEMENT ([place])  LEFT DIABETIC;  Surgeon: Leandrew Koyanagi, MD;  Location: [place];  Service: Ophthalmology;  Laterality: Left;  Diabetic - insulin and oral meds  . CHOLECYSTECTOMY    . COLONOSCOPY N/A 01/19/2015   Procedure: COLONOSCOPY;  Surgeon: Josefine Class, MD;  Location: [facility] ENDOSCOPY;  Service: Endoscopy;  Laterality: N/A;  . COLONOSCOPY WITH PROPOFOL N/A 07/11/2017   Procedure: COLONOSCOPY WITH PROPOFOL;  Surgeon: Toledo, Benay Pike, MD;  Location: [facility] ENDOSCOPY;  Service: Gastroenterology;  Laterality: N/A;  . DIALYSIS/PERMA CATHETER INSERTION N/A 01/19/2020   Procedure: DIALYSIS/PERMA CATHETER INSERTION;  Surgeon: Algernon Huxley, MD;  Location: [place] CV LAB;  Service: Cardiovascular;  Laterality: N/A;  . DIALYSIS/PERMA CATHETER REMOVAL N/A 03/18/2020   Procedure: DIALYSIS/PERMA CATHETER REMOVAL;  Surgeon: Algernon Huxley, MD;  Location: [place] CV LAB;  Service: Cardiovascular;  Laterality: N/A;  . ESOPHAGOGASTRODUODENOSCOPY N/A 01/19/2015   Procedure: ESOPHAGOGASTRODUODENOSCOPY (EGD);  Surgeon: Josefine Class, MD;  Location: [facility] ENDOSCOPY;  Service: Endoscopy;  Laterality: N/A;  . ESOPHAGOGASTRODUODENOSCOPY (EGD) WITH PROPOFOL N/A 07/11/2017   Procedure: ESOPHAGOGASTRODUODENOSCOPY (EGD) WITH PROPOFOL;  Surgeon: Toledo, Benay Pike, MD;  Location: [facility] ENDOSCOPY;  Service: Gastroenterology;  Laterality: N/A;  . SHOULDER ARTHROSCOPY      Social History   Socioeconomic History  . Marital status: Widowed    Spouse name: Not on file  . Number of children: Not on file  . Years of education: Not on file  . Highest education level: Not on file  Occupational History  . Not on  file  Tobacco Use  . Smoking status: Never Smoker  . Smokeless tobacco: Never Used  Vaping Use  . Vaping Use: Never used  Substance and Sexual Activity  . Alcohol use: No  . Drug use: Not on file  . Sexual activity: Not on file  Other Topics Concern  . Not on file  Social History Narrative  . Not on file   Social Determinants of Health   Financial Resource Strain:   . Difficulty of Paying Living Expenses: Not on file  Food Insecurity:   . Worried About Charity fundraiser in the Last Year: Not on file  . Ran Out of Food in the Last Year: Not on file  Transportation Needs:   . Lack of Transportation (Medical): Not on file  . Lack of Transportation (Non-Medical): Not on file  Physical Activity:   . Days of Exercise per Week: Not on file  . Minutes of Exercise per Session: Not on file  Stress:   . Feeling of Stress : Not on file  Social Connections:   . Frequency of Communication with Friends and Family: Not on file  . Frequency of Social Gatherings with Friends and Family: Not on file  . Attends Religious Services: Not on file  . Active Member of Clubs or Organizations: Not on file  . Attends Archivist Meetings: Not on file  . Marital Status: Not on file  Intimate Partner Violence:   . Fear of Current or  Ex-Partner: Not on file  . Emotionally Abused: Not on file  . Physically Abused: Not on file  . Sexually Abused: Not on file    No family history on file.   Current Outpatient Medications:  .  acetaminophen (TYLENOL) 500 MG tablet, Take 1,000 mg by mouth every 6 (six) hours as needed for moderate pain or headache., Disp: , Rfl:  .  albuterol (VENTOLIN HFA) 108 (90 Base) MCG/ACT inhaler, Inhale 2 puffs into the lungs every 6 (six) hours as needed for wheezing or shortness of breath., Disp: 6.7 g, Rfl: 3 .  alendronate (FOSAMAX) 70 MG tablet, Take 70 mg by mouth once a week. , Disp: , Rfl:  .  apixaban (ELIQUIS) 2.5 MG TABS tablet, Take 2.5 mg by mouth 2  (two) times daily. , Disp: , Rfl:  .  atorvastatin (LIPITOR) 80 MG tablet, Take 80 mg by mouth daily., Disp: , Rfl:  .  Calcium Carb-Cholecalciferol (CALCIUM 600 + D PO), Take 1 tablet by mouth daily. , Disp: , Rfl:  .  cyanocobalamin (,VITAMIN B-12,) 1000 MCG/ML injection, Inject 1,000 mcg into the muscle every 30 (thirty) days. , Disp: , Rfl:  .  esomeprazole (NEXIUM) 20 MG capsule, Take 20 mg by mouth at bedtime., Disp: , Rfl:  .  ferrous sulfate 325 (65 FE) MG tablet, Take 325 mg by mouth daily with breakfast., Disp: , Rfl:  .  Ipratropium-Albuterol (COMBIVENT RESPIMAT) 20-100 MCG/ACT AERS respimat, Inhale 1 puff into the lungs 3 (three) times daily., Disp: , Rfl:  .  ipratropium-albuterol (DUONEB) 0.5-2.5 (3) MG/3ML SOLN, Take 3 mLs by nebulization 3 (three) times daily. (Patient not taking: Reported on 01/27/2020), Disp: 360 mL, Rfl: 3 .  multivitamin (RENA-VIT) TABS tablet, Take 1 tablet by mouth at bedtime., Disp: 30 tablet, Rfl: 0 .  Nutritional Supplements (FEEDING SUPPLEMENT, NEPRO CARB STEADY,) LIQD, Take 237 mLs by mouth 2 (two) times daily between meals., Disp: 14400 mL, Rfl: 0 .  sodium chloride (OCEAN) 0.65 % SOLN nasal spray, Place 1 spray into both nostrils as needed for congestion., Disp: 15 mL, Rfl: 0 .  vitamin B-12 (CYANOCOBALAMIN) 100 MCG tablet, Take 100 mcg by mouth daily., Disp: , Rfl:   Physical exam:  Vitals:   05/07/20 0938  BP: (!) 144/63  Pulse: 71  Resp: 16  Temp: 97.8 F (36.6 C)  TempSrc: Tympanic  Weight: 158 lb 7 oz (71.9 kg)  Height: 5\' 5"  (1.651 m)   Physical Exam Constitutional:      General: She is not in acute distress.    Comments: Ambulates with a walker.  Kyphotic spine  Cardiovascular:     Rate and Rhythm: Normal rate and regular rhythm.     Heart sounds: Normal heart sounds.  Pulmonary:     Effort: Pulmonary effort is normal.     Breath sounds: Normal breath sounds.  Abdominal:     General: Bowel sounds are normal.     Palpations:  Abdomen is soft.  Skin:    General: Skin is warm and dry.  Neurological:     Mental Status: She is alert and oriented to person, place, and time.      CMP Latest Ref Rng & Units 01/23/2020  Glucose 70 - 99 mg/dL 137(H)  BUN 8 - 23 mg/dL 29(H)  Creatinine 0.44 - 1.00 mg/dL 1.88(H)  Sodium 135 - 145 mmol/L 140  Potassium 3.5 - 5.1 mmol/L 4.1  Chloride 98 - 111 mmol/L 102  CO2 22 - 32  mmol/L 30  Calcium 8.9 - 10.3 mg/dL 8.4(L)  Total Protein 6.5 - 8.1 g/dL -  Total Bilirubin 0.3 - 1.2 mg/dL -  Alkaline Phos 38 - 126 U/L -  AST 15 - 41 U/L -  ALT 0 - 44 U/L -   CBC Latest Ref Rng & Units 05/07/2020  WBC 4.0 - 10.5 K/uL 8.3  Hemoglobin 12.0 - 15.0 g/dL 10.2(L)  Hematocrit 36 - 46 % 31.8(L)  Platelets 150 - 400 K/uL 271    Assessment and plan- Patient is a 85 y.o. female iron and B12 deficiency anemia here for routine follow-up  Patient's hemoglobin did drop down to 8.3 in May 2021.  It is up to 10.2 today.  She does have some baseline CKD with a creatinine of 1.5.  White count and platelets are normal.  Iron studies B12 levels are currently pending.  Given the improvement noted hemoglobin I would currently await above labs and continue to monitor her CBC conservatively.  She will be getting her labs checked at 3 months with nephrology and I will see her back in 6 months with CBC ferritin and iron studies and B12  I have encouraged the patient to take her Covid vaccine but she remains hesitant at this time   Visit Diagnosis 1. Iron deficiency anemia, unspecified iron deficiency anemia type   2. B12 deficiency      Dr. Randa Evens, MD, MPH [facility] at [facility] [phone] 05/07/2020 9:24 AM

## 2020-05-10 ENCOUNTER — Encounter: Payer: Self-pay | Admitting: Hospice and Palliative Medicine

## 2020-05-10 NOTE — Progress Notes (Unsigned)
I met with patient son-in-law,James Carman Ching, to complete MOST form.    I completed a MOST form today. The patient and family outlined their wishes for the following treatment decisions:  Cardiopulmonary Resuscitation: Do Not Attempt Resuscitation (DNR/No CPR)  Medical Interventions: Limited Additional Interventions: Use medical treatment, IV fluids and cardiac monitoring as indicated, DO NOT USE intubation or mechanical ventilation. May consider use of less invasive airway support such as BiPAP or CPAP. Also provide comfort measures. Transfer to the hospital if indicated. Avoid intensive care.   Antibiotics: Antibiotics if indicated  IV Fluids: IV fluids if indicated  Feeding Tube: Feeding tube for a defined trial period   Time Total: 15 minutes  Visit consisted of counseling and education dealing with the complex and emotionally intense issues of symptom management and palliative care in the setting of serious and potentially life-threatening illness.Greater than 50%  of this time was spent counseling and coordinating care related to the above assessment and plan.  Signed by: Altha Harm, PhD, NP-C

## 2020-05-18 ENCOUNTER — Telehealth: Payer: Self-pay | Admitting: Primary Care

## 2020-05-18 NOTE — Telephone Encounter (Signed)
Called to home again, message let. Instant messaging to provider [place] to see if they have information on reaching patient.

## 2020-07-07 ENCOUNTER — Telehealth: Payer: Self-pay | Admitting: Nurse Practitioner

## 2020-07-07 NOTE — Telephone Encounter (Signed)
I called Dorothy [name] home and cell, messages left to reschedule f/u pc visit with contact information; I also called Dorothy [name], Dorothy [name] granddaughter, no answer, message left with contact information to reschedule p/c visit

## 2020-07-12 ENCOUNTER — Telehealth: Payer: Self-pay

## 2020-07-12 NOTE — Telephone Encounter (Signed)
Message left for grand daughter to check in and offer to schedule a follow up visit with Palliative care NP.

## 2020-07-14 ENCOUNTER — Telehealth: Payer: Self-pay

## 2020-07-14 NOTE — Telephone Encounter (Signed)
Received message from patient's grand daughter Lavella Lemons, patient is at her daughter's home in [place] until after Thanksgiving.

## 2020-08-09 ENCOUNTER — Other Ambulatory Visit: Payer: Medicare Other

## 2020-08-19 ENCOUNTER — Telehealth: Payer: Self-pay

## 2020-08-19 NOTE — Telephone Encounter (Signed)
Palliative Care Volunteer call made to check in on patient on 08/18/20. No answer. 

## 2020-08-28 IMAGING — DX DG CHEST 1V PORT
1 series · 1 of 1 positions shown · non-contrast
Comparison: 10/06/2019

CLINICAL DATA: Shortness of breath and weakness.

EXAM:
PORTABLE CHEST 1 VIEW

[chest ap]
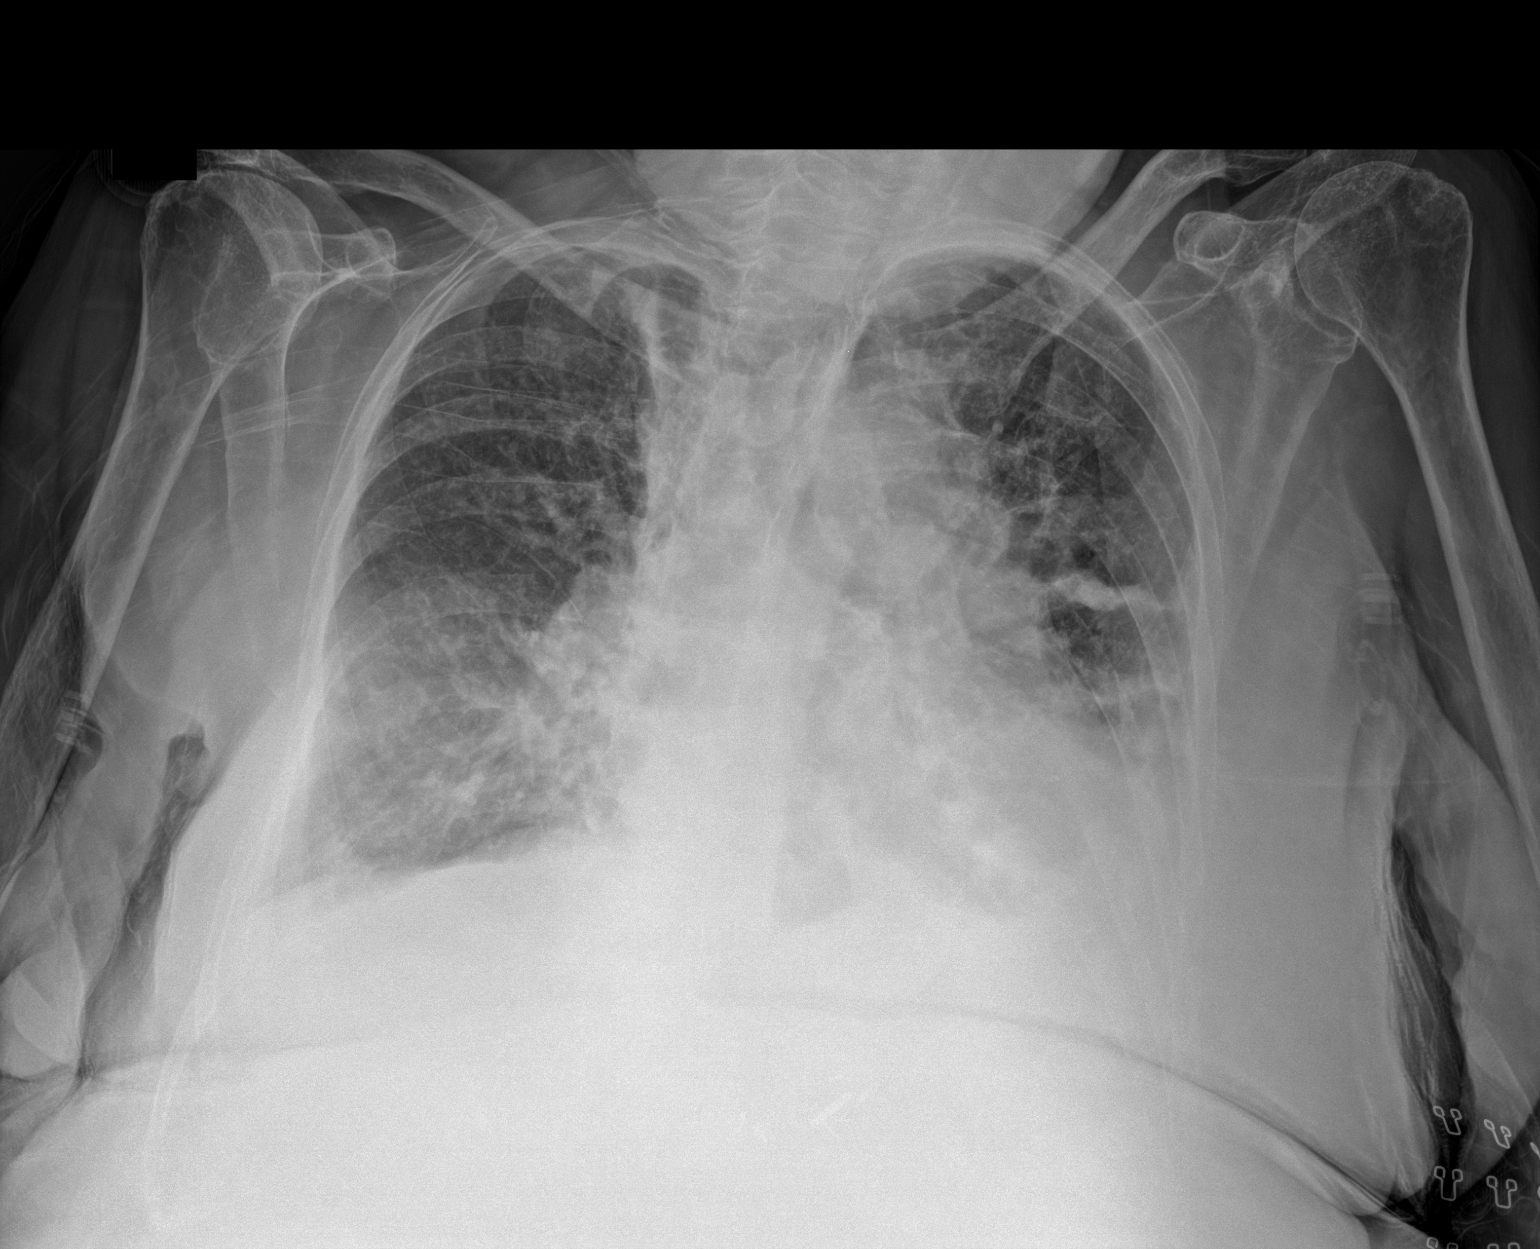

[1 of 1 positions shown; findings below may reference images not displayed]

FINDINGS: The cardiac silhouette remains borderline enlarged. Linear opacities
are again demonstrated in the mid and lower lung zones on the left,
resolved in the upper lung zones. Interval mild patchy density at
the right lung base. Mildly increased patchy density at the left
lung base with possible small bilateral pleural effusions. Diffuse
osteopenia.
IMPRESSION: 1. Interval mild patchy atelectasis or pneumonia at the right lung
base.
2. Mildly increased patchy atelectasis or pneumonia at the left lung
base.
3. Possible small bilateral pleural effusions.
4. Stable borderline cardiomegaly.

## 2020-08-29 IMAGING — US US RENAL
1 series · 14 of 25 positions shown · non-contrast
Comparison: 04/03/2008

CLINICAL DATA: 84-year-old female with a history of acute kidney
injury

EXAM:
RENAL / URINARY TRACT ULTRASOUND COMPLETE

[Series 1: us renal · 14 of 35 slices shown]
[im 1/35]
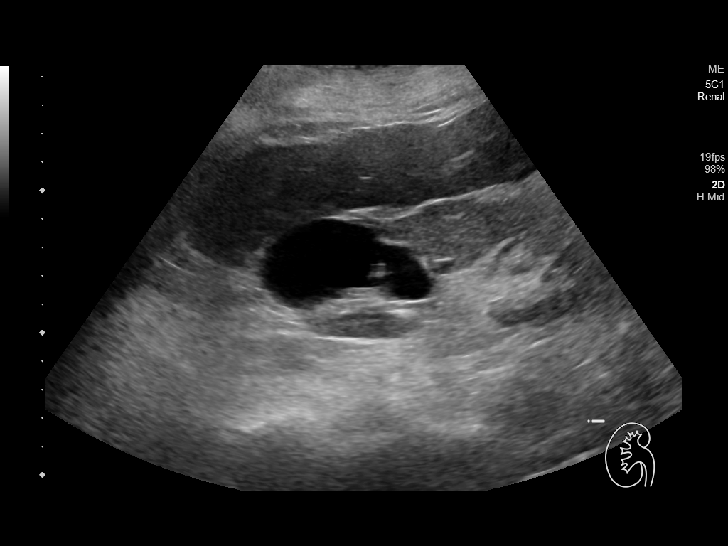
[im 3/35]
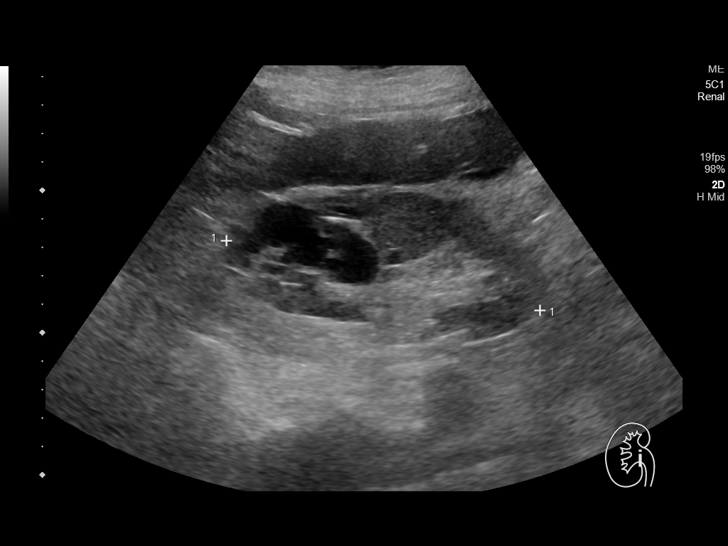
[im 6/35]
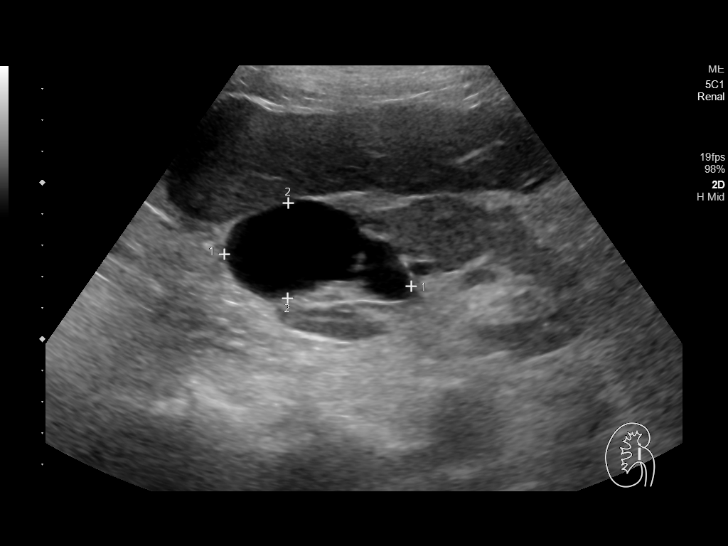
[im 9/35]
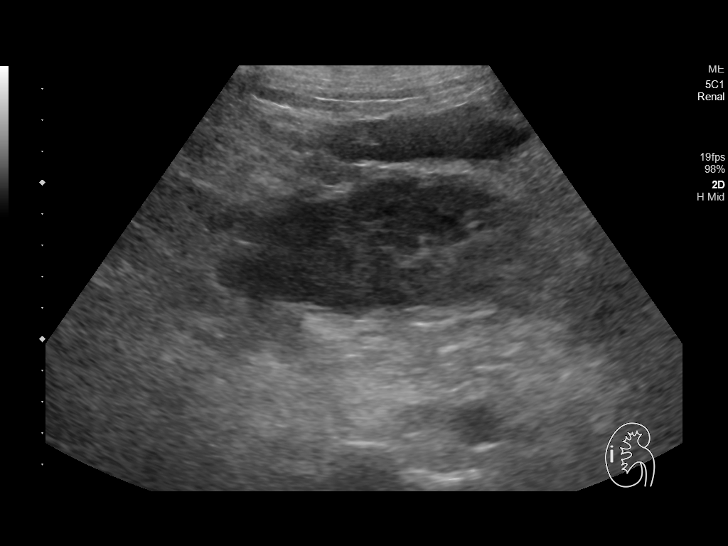
[im 12/35]
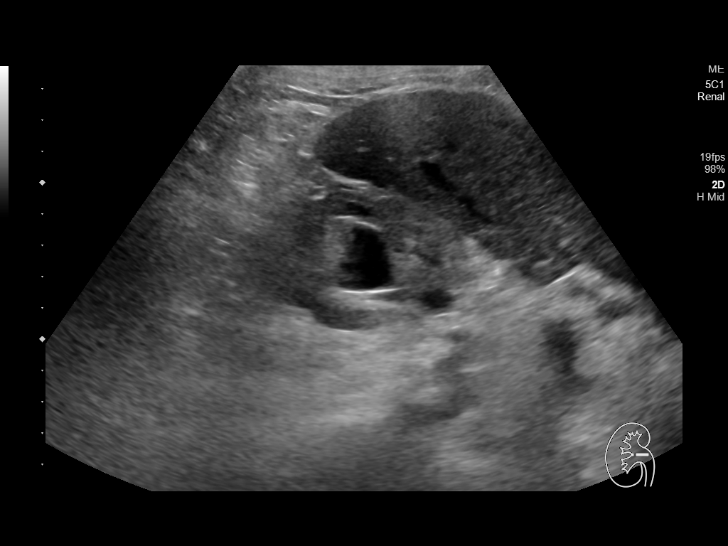
[im 13/35]
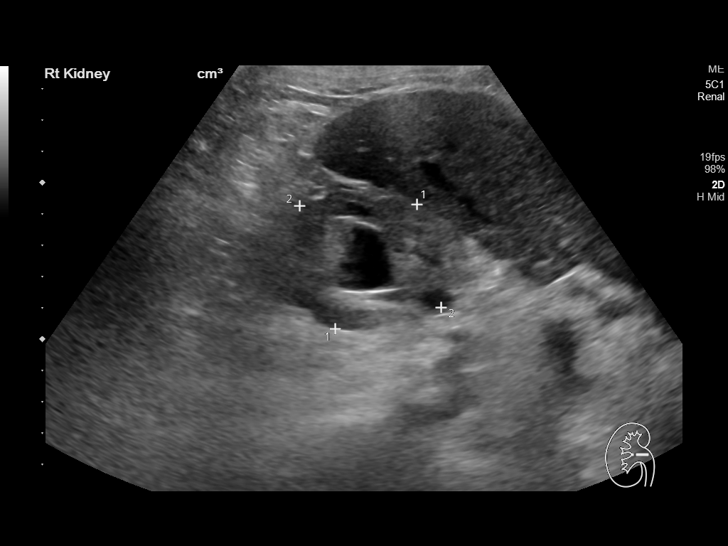
[im 16/35]
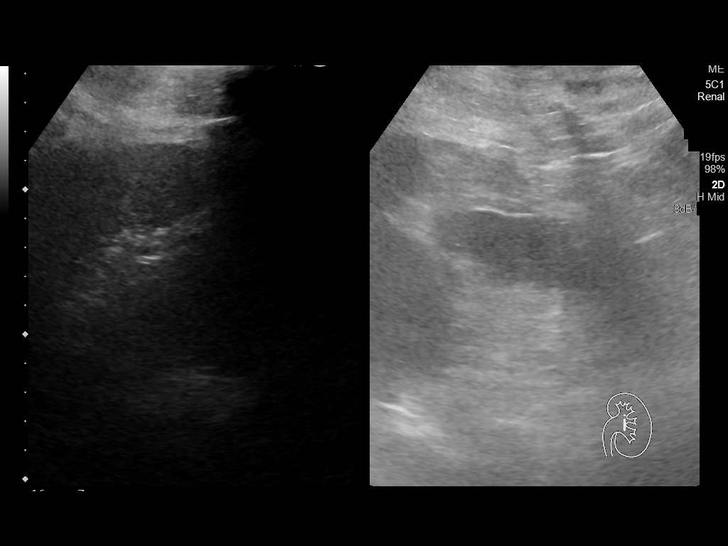
[im 19/35]
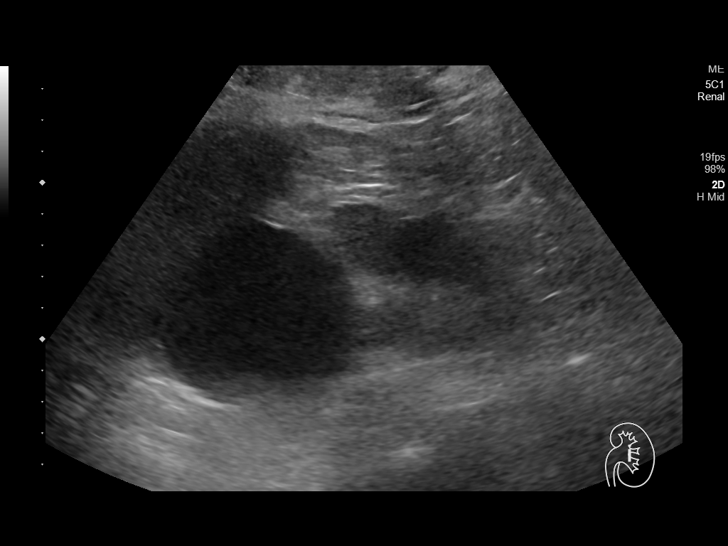
[im 22/35]
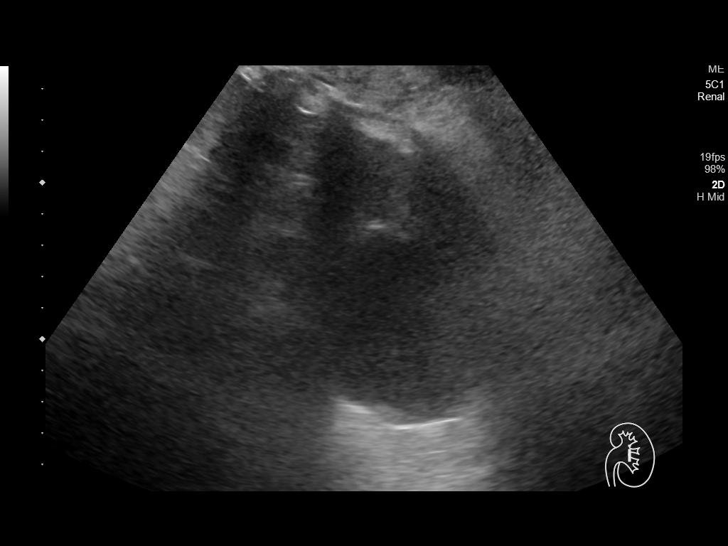
[im 23/35]
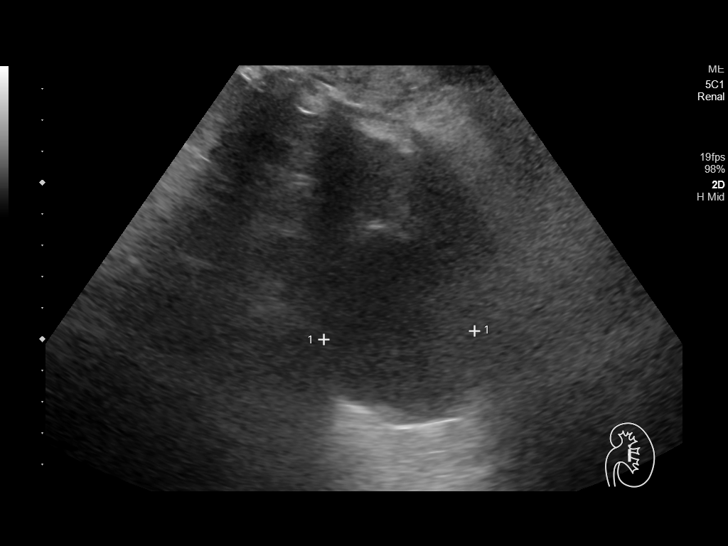
[im 26/35]
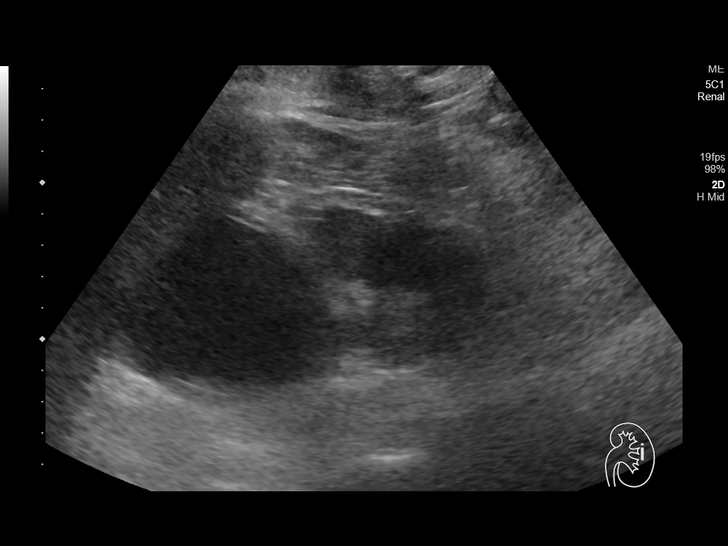
[im 29/35]
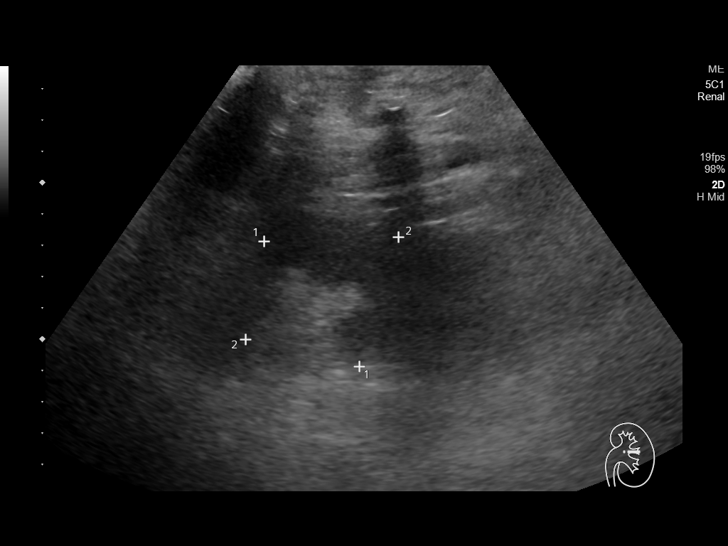
[im 32/35]
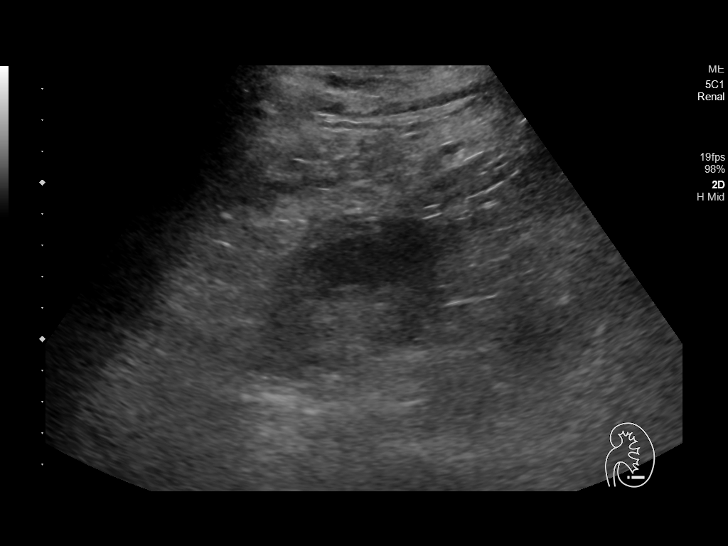
[im 35/35]
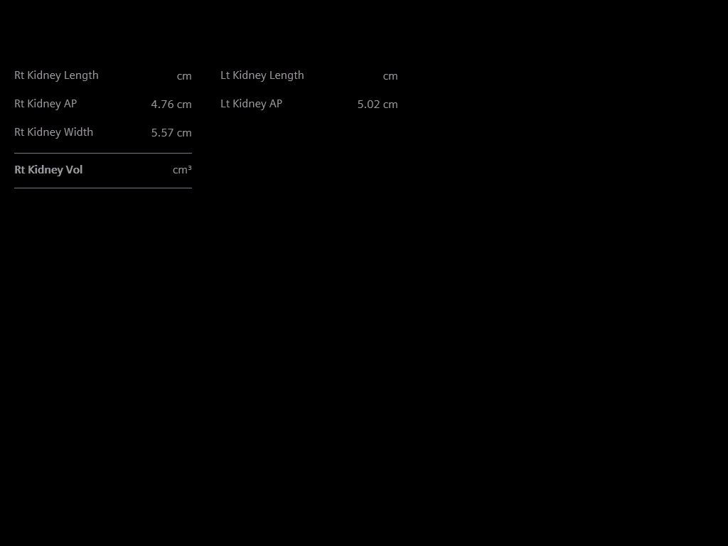

[14 of 25 positions shown; findings below may reference images not displayed]

FINDINGS: Right Kidney:

Length: 11.3 cm x 5.6 cm x 4.8 cm, 157 cc. No hydronephrosis. Flow
confirmed in the hilum. Cystic structure with through transmission
of the right upper pole cortex measures 6.1 cm x 3.0 cm x 3.4 cm.

Left Kidney:

Length: 12.6 cm x 5.0 cm x 5.9 cm, 193 cc. No hydronephrosis. Flow
confirmed in the hilum. Cystic structure in the superior left kidney
with through transmission and no internal complexity or flow
measures 5.2 cm x 5.0 cm by 4.8 cm.

Bladder:

Appears normal for degree of bladder distention.
IMPRESSION: Negative for hydronephrosis of the bilateral kidneys.

Right-sided Bosniak 2 cyst and a left-sided Bosniak 1 cyst.

## 2020-08-31 IMAGING — DX DG CHEST 1V PORT
1 series · 1 of 1 positions shown · non-contrast
Comparison: 01/15/2020

CLINICAL DATA: Shortness of breath and hypoxia.

EXAM:
PORTABLE CHEST 1 VIEW

[chest ap]
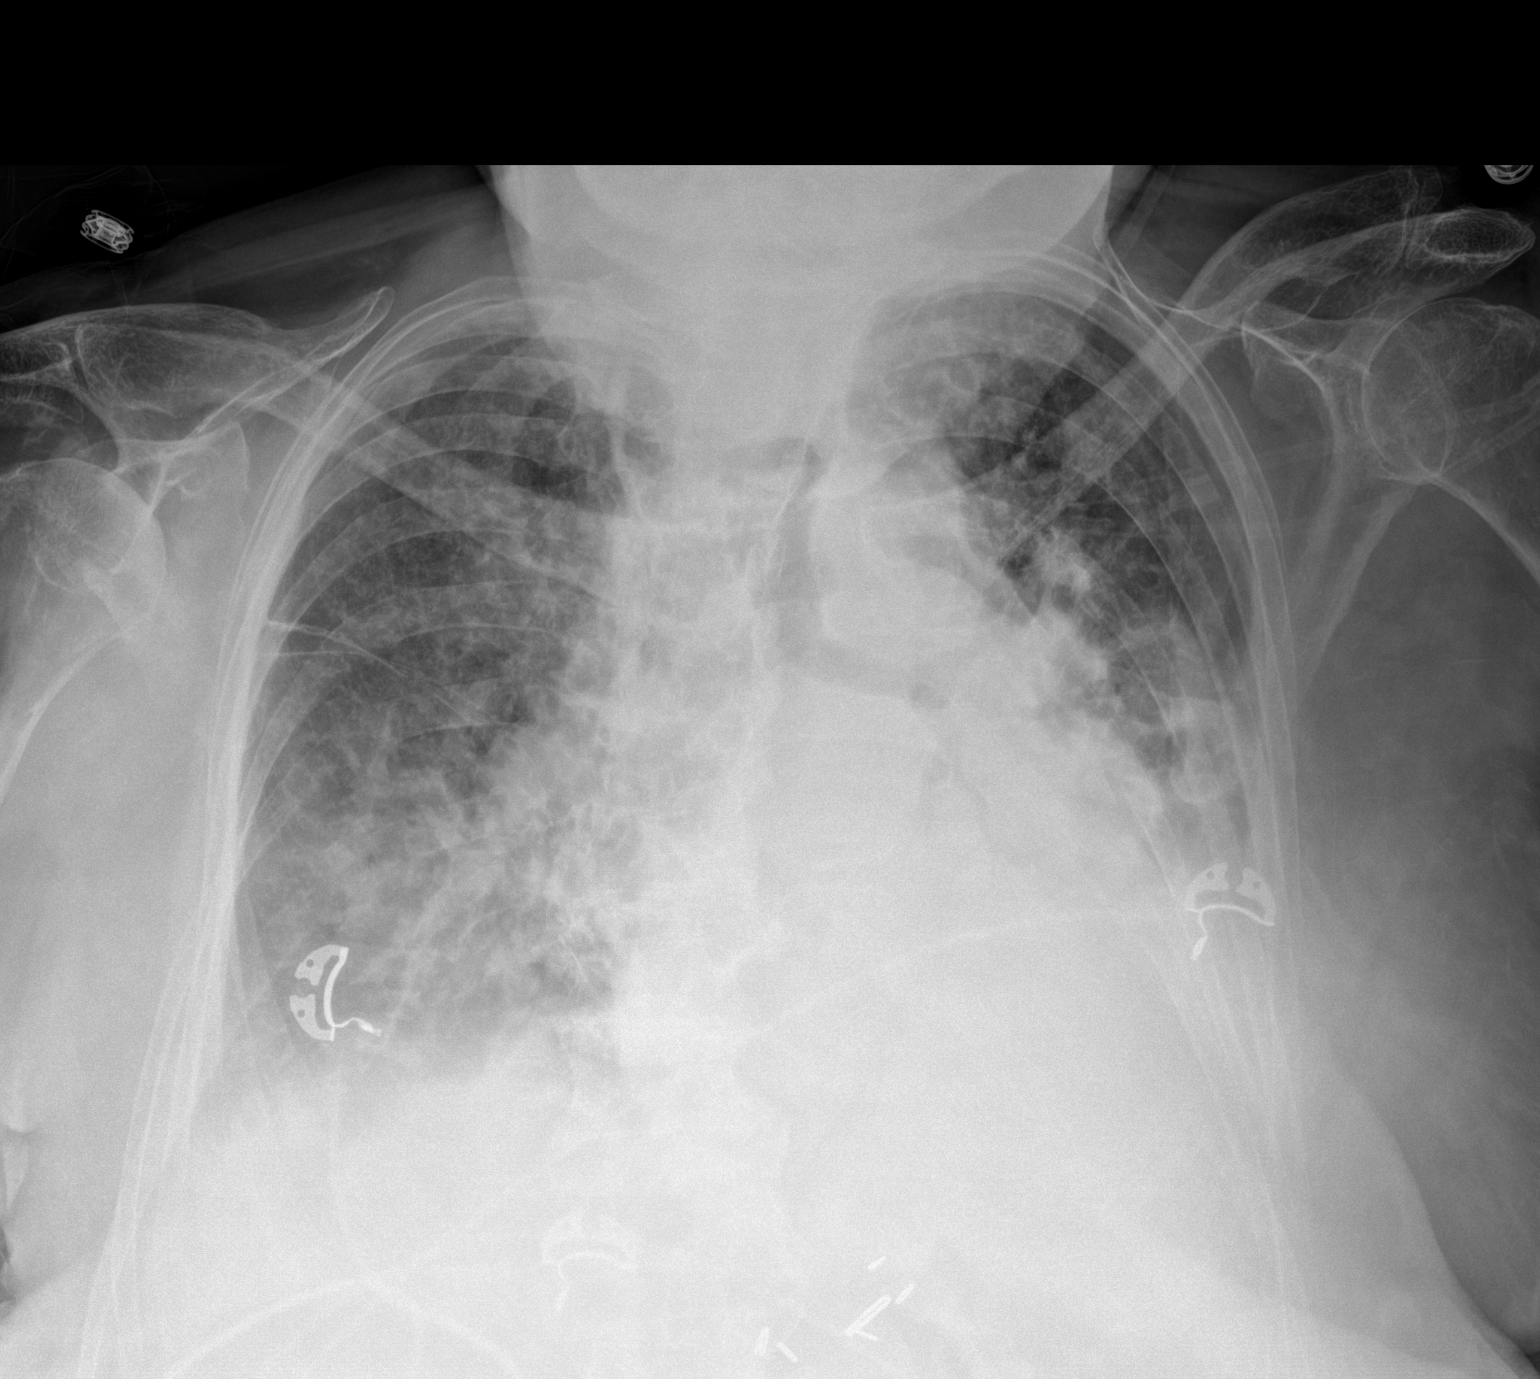

[1 of 1 positions shown; findings below may reference images not displayed]

FINDINGS: Stable cardiomegaly. Increased confluent opacity is seen in the left
lower lung since prior exam. Left pleural effusion cannot be
excluded. Diffuse interstitial infiltrates show no significant
change.
IMPRESSION: 1. Increased confluent opacity in left lower lung. Left pleural
effusion cannot be excluded.
2. Stable cardiomegaly and diffuse interstitial infiltrates,
suspicious for edema.

## 2020-11-01 ENCOUNTER — Telehealth: Payer: Self-pay | Admitting: Oncology

## 2020-11-01 NOTE — Telephone Encounter (Signed)
11/01/2020 Left VM informing pt that appts on 2/28 have been r/s due to provider being on PAL. New appts made for 12/03/20. Left callback in case there are any questions or issues w/ appt date/time SRW

## 2020-11-08 ENCOUNTER — Other Ambulatory Visit: Payer: Medicare Other

## 2020-11-08 ENCOUNTER — Ambulatory Visit: Payer: Medicare Other | Admitting: Oncology

## 2020-12-03 ENCOUNTER — Ambulatory Visit: Payer: Medicare Other | Admitting: Oncology

## 2020-12-03 ENCOUNTER — Other Ambulatory Visit: Payer: Medicare Other

## 2020-12-06 ENCOUNTER — Inpatient Hospital Stay: Payer: Medicare Other | Attending: Oncology | Admitting: Oncology

## 2020-12-06 ENCOUNTER — Inpatient Hospital Stay: Payer: Medicare Other

## 2020-12-06 VITALS — BP 149/50 | HR 68 | Temp 96.3°F | Resp 16 | Wt 144.3 lb

## 2020-12-06 DIAGNOSIS — D519 Vitamin B12 deficiency anemia, unspecified: Secondary | ICD-10-CM | POA: Diagnosis not present

## 2020-12-06 DIAGNOSIS — D509 Iron deficiency anemia, unspecified: Secondary | ICD-10-CM | POA: Diagnosis present

## 2020-12-06 LAB — CBC WITH DIFFERENTIAL/PLATELET
Abs Immature Granulocytes: 0.02 10*3/uL (ref 0.00–0.07)
Basophils Absolute: 0 10*3/uL (ref 0.0–0.1)
Basophils Relative: 0 %
Eosinophils Absolute: 0.1 10*3/uL (ref 0.0–0.5)
Eosinophils Relative: 1 %
HCT: 31.7 % — ABNORMAL LOW (ref 36.0–46.0)
Hemoglobin: 10.1 g/dL — ABNORMAL LOW (ref 12.0–15.0)
Immature Granulocytes: 0 %
Lymphocytes Relative: 18 %
Lymphs Abs: 1 10*3/uL (ref 0.7–4.0)
MCH: 26.7 pg (ref 26.0–34.0)
MCHC: 31.9 g/dL (ref 30.0–36.0)
MCV: 83.9 fL (ref 80.0–100.0)
Monocytes Absolute: 0.4 10*3/uL (ref 0.1–1.0)
Monocytes Relative: 7 %
Neutro Abs: 4.2 10*3/uL (ref 1.7–7.7)
Neutrophils Relative %: 74 %
Platelets: 198 10*3/uL (ref 150–400)
RBC: 3.78 MIL/uL — ABNORMAL LOW (ref 3.87–5.11)
RDW: 15.6 % — ABNORMAL HIGH (ref 11.5–15.5)
WBC: 5.7 10*3/uL (ref 4.0–10.5)
nRBC: 0 % (ref 0.0–0.2)

## 2020-12-06 LAB — FERRITIN: Ferritin: 24 ng/mL (ref 11–307)

## 2020-12-06 LAB — IRON AND TIBC
Iron: 39 ug/dL (ref 28–170)
Saturation Ratios: 12 % (ref 10.4–31.8)
TIBC: 323 ug/dL (ref 250–450)
UIBC: 284 ug/dL

## 2020-12-06 LAB — VITAMIN B12: Vitamin B-12: 2382 pg/mL — ABNORMAL HIGH (ref 180–914)

## 2020-12-06 NOTE — Progress Notes (Signed)
Hematology/Oncology Consult note [facility]  [phone] [phone]  Patient Care Team: Letta Median, MD as PCP - General (Family Medicine) Alisa Graff, FNP as Nurse Practitioner (Family Medicine) Isaias Cowman, MD as Consulting Physician (Cardiology) Sindy Guadeloupe, MD as Consulting Physician (Oncology) Erby Pian, MD as Referring Physician (Specialist) Jason Coop, NP as Nurse Practitioner   Name of the patient: Dorothy [name]  [id number]  19-Mar-1935   Date of visit: 12/06/20  Diagnosis- iron deficiencyand b12 deficiencyanemia  Chief complaint/ Reason for visit-routine follow-up of anemia  Heme/Onc history: patient is 86 year old female who was seen by Dr. Ouida Sills 2016 deficiency anemia. At that time she had an EGD and a colonoscopy which did not reveal any evidence of bleeding. She didn't get better with oral iron and was eventually asked to follow-up with her primary care doctor.She does have a remote h/o Billroth I gastro duodenostomy  Patient was admitted to the hospital for severe allergy angioedema and was found to have a pneumonia and heart failurein April 2018. Her hemoglobin was 7.3 with an MCV of 68. She has received 2 doses ofvenofer200 mg along with one unit of blood transfusion. She was seen by Dr. Alice Reichert and underwent EGD and colonoscopy in October 2018. EGD showed evidence of billroth I but ws otherwise normal. Colonoscopy showed diverticulosis, internal and external hemorrhoids and 1 polyp (tubular adenoma). No active bleeding  Iron studies from 12/19/2016 showed a low serum iron of 13 and low iron saturation of 3% and normal TIBC of 426. Ferritin was low at 9 and B12 was normal at 151. Patient has receivedvenofer and B12 injections inthe past  Interval history-patient reports that she has been at her baseline state of health in the last 6 months.  No recent  hospitalizations.Her appetite which was suppressed for a while has gotten better  ECOG PS- 2 Pain scale- 0  Review of systems- Review of Systems  Constitutional: Positive for malaise/fatigue. Negative for chills, fever and weight loss.  HENT: Negative for congestion, ear discharge and nosebleeds.   Eyes: Negative for blurred vision.  Respiratory: Negative for cough, hemoptysis, sputum production, shortness of breath and wheezing.   Cardiovascular: Negative for chest pain, palpitations, orthopnea and claudication.  Gastrointestinal: Negative for abdominal pain, blood in stool, constipation, diarrhea, heartburn, melena, nausea and vomiting.  Genitourinary: Negative for dysuria, flank pain, frequency, hematuria and urgency.  Musculoskeletal: Negative for back pain, joint pain and myalgias.  Skin: Negative for rash.  Neurological: Negative for dizziness, tingling, focal weakness, seizures, weakness and headaches.  Endo/Heme/Allergies: Does not bruise/bleed easily.  Psychiatric/Behavioral: Negative for depression and suicidal ideas. The patient does not have insomnia.       Allergies  Allergen Reactions  . Tramadol Anaphylaxis  . Aspirin     "Stomach problems"     Past Medical History:  Diagnosis Date  . Anemia   . Arthritis    hands, knees, back  . Atrial fibrillation ([place])   . CHF (congestive heart failure) ([place])   . COPD (chronic obstructive pulmonary disease) ([place])   . Diabetes mellitus without complication ([place])   . Dyspnea   . GERD (gastroesophageal reflux disease)   . Hyperlipidemia   . Hypertension   . PONV (postoperative nausea and vomiting)   . Sleep apnea    told she needs CPAP, does not have  . Wears dentures    full upper and lower.  only wears upper  Past Surgical History:  Procedure Laterality Date  . ABDOMINAL HYSTERECTOMY    . CATARACT EXTRACTION W/PHACO Right 07/12/2016   Procedure: CATARACT EXTRACTION PHACO AND INTRAOCULAR LENS PLACEMENT (IOC);   Surgeon: Leandrew Koyanagi, MD;  Location: [place];  Service: Ophthalmology;  Laterality: Right;  DIABETIC - insulin and oral meds sleep apnea  . CATARACT EXTRACTION W/PHACO Left 04/09/2019   Procedure: CATARACT EXTRACTION PHACO AND INTRAOCULAR LENS PLACEMENT ([place])  LEFT DIABETIC;  Surgeon: Leandrew Koyanagi, MD;  Location: [place];  Service: Ophthalmology;  Laterality: Left;  Diabetic - insulin and oral meds  . CHOLECYSTECTOMY    . COLONOSCOPY N/A 01/19/2015   Procedure: COLONOSCOPY;  Surgeon: Josefine Class, MD;  Location: [facility] ENDOSCOPY;  Service: Endoscopy;  Laterality: N/A;  . COLONOSCOPY WITH PROPOFOL N/A 07/11/2017   Procedure: COLONOSCOPY WITH PROPOFOL;  Surgeon: Toledo, Benay Pike, MD;  Location: [facility] ENDOSCOPY;  Service: Gastroenterology;  Laterality: N/A;  . DIALYSIS/PERMA CATHETER INSERTION N/A 01/19/2020   Procedure: DIALYSIS/PERMA CATHETER INSERTION;  Surgeon: Algernon Huxley, MD;  Location: [place] CV LAB;  Service: Cardiovascular;  Laterality: N/A;  . DIALYSIS/PERMA CATHETER REMOVAL N/A 03/18/2020   Procedure: DIALYSIS/PERMA CATHETER REMOVAL;  Surgeon: Algernon Huxley, MD;  Location: [place] CV LAB;  Service: Cardiovascular;  Laterality: N/A;  . ESOPHAGOGASTRODUODENOSCOPY N/A 01/19/2015   Procedure: ESOPHAGOGASTRODUODENOSCOPY (EGD);  Surgeon: Josefine Class, MD;  Location: [facility] ENDOSCOPY;  Service: Endoscopy;  Laterality: N/A;  . ESOPHAGOGASTRODUODENOSCOPY (EGD) WITH PROPOFOL N/A 07/11/2017   Procedure: ESOPHAGOGASTRODUODENOSCOPY (EGD) WITH PROPOFOL;  Surgeon: Toledo, Benay Pike, MD;  Location: [facility] ENDOSCOPY;  Service: Gastroenterology;  Laterality: N/A;  . SHOULDER ARTHROSCOPY      Social History   Socioeconomic History  . Marital status: Widowed    Spouse name: Not on file  . Number of children: Not on file  . Years of education: Not on file  . Highest education level: Not on file  Occupational History  . Not on file  Tobacco Use   . Smoking status: Never Smoker  . Smokeless tobacco: Never Used  Vaping Use  . Vaping Use: Never used  Substance and Sexual Activity  . Alcohol use: No  . Drug use: Never  . Sexual activity: Not on file  Other Topics Concern  . Not on file  Social History Narrative  . Not on file   Social Determinants of Health   Financial Resource Strain: Not on file  Food Insecurity: Not on file  Transportation Needs: Not on file  Physical Activity: Not on file  Stress: Not on file  Social Connections: Not on file  Intimate Partner Violence: Not on file    No family history on file.   Current Outpatient Medications:  .  acetaminophen (TYLENOL) 500 MG tablet, Take 1,000 mg by mouth every 6 (six) hours as needed for moderate pain or headache., Disp: , Rfl:  .  albuterol (VENTOLIN HFA) 108 (90 Base) MCG/ACT inhaler, Inhale 2 puffs into the lungs every 6 (six) hours as needed for wheezing or shortness of breath., Disp: 6.7 g, Rfl: 3 .  amLODipine (NORVASC) 10 MG tablet, Take 10 mg by mouth daily., Disp: , Rfl:  .  apixaban (ELIQUIS) 2.5 MG TABS tablet, Take 2.5 mg by mouth 2 (two) times daily. , Disp: , Rfl:  .  atorvastatin (LIPITOR) 80 MG tablet, Take 80 mg by mouth daily., Disp: , Rfl:  .  calcitRIOL (ROCALTROL) 0.25 MCG capsule, Take by mouth., Disp: , Rfl:  .  Calcium Carb-Cholecalciferol (CALCIUM 600 + D PO), Take 1 tablet by mouth in the morning and at bedtime. , Disp: , Rfl:  .  empagliflozin (JARDIANCE) 10 MG TABS tablet, , Disp: , Rfl:  .  esomeprazole (NEXIUM) 20 MG capsule, Take 20 mg by mouth at bedtime., Disp: , Rfl:  .  ipratropium-albuterol (DUONEB) 0.5-2.5 (3) MG/3ML SOLN, Take 3 mLs by nebulization 3 (three) times daily., Disp: 360 mL, Rfl: 3 .  mirtazapine (REMERON) 15 MG tablet, Take 15 mg by mouth at bedtime., Disp: , Rfl:  .  vitamin B-12 (CYANOCOBALAMIN) 100 MCG tablet, Take 100 mcg by mouth daily., Disp: , Rfl:  .  ferrous sulfate 325 (65 FE) MG tablet, Take 325 mg by  mouth daily with breakfast. (Patient not taking: Reported on 12/06/2020), Disp: , Rfl:  .  Ipratropium-Albuterol (COMBIVENT RESPIMAT) 20-100 MCG/ACT AERS respimat, Inhale 1 puff into the lungs 3 (three) times daily. (Patient not taking: Reported on 12/06/2020), Disp: , Rfl:  .  multivitamin (RENA-VIT) TABS tablet, Take 1 tablet by mouth at bedtime. (Patient not taking: Reported on 12/06/2020), Disp: 30 tablet, Rfl: 0 .  sodium chloride (OCEAN) 0.65 % SOLN nasal spray, Place 1 spray into both nostrils as needed for congestion. (Patient not taking: Reported on 12/06/2020), Disp: 15 mL, Rfl: 0  Physical exam:  Vitals:   12/06/20 1138  BP: (!) 149/50  Pulse: 68  Resp: 16  Temp: (!) 96.3 F (35.7 C)  TempSrc: Tympanic  SpO2: 99%  Weight: 144 lb 4.8 oz (65.5 kg)   Physical Exam Constitutional:      Comments: Elderly female who ambulates with a walker.  Appears in no acute distress  Eyes:     Pupils: Pupils are equal, round, and reactive to light.  Cardiovascular:     Rate and Rhythm: Normal rate and regular rhythm.     Heart sounds: Normal heart sounds.  Pulmonary:     Effort: Pulmonary effort is normal.     Breath sounds: Normal breath sounds.  Abdominal:     General: Bowel sounds are normal.     Palpations: Abdomen is soft.  Skin:    General: Skin is warm and dry.  Neurological:     Mental Status: She is alert and oriented to person, place, and time.      CMP Latest Ref Rng & Units 01/23/2020  Glucose 70 - 99 mg/dL 137(H)  BUN 8 - 23 mg/dL 29(H)  Creatinine 0.44 - 1.00 mg/dL 1.88(H)  Sodium 135 - 145 mmol/L 140  Potassium 3.5 - 5.1 mmol/L 4.1  Chloride 98 - 111 mmol/L 102  CO2 22 - 32 mmol/L 30  Calcium 8.9 - 10.3 mg/dL 8.4(L)  Total Protein 6.5 - 8.1 g/dL -  Total Bilirubin 0.3 - 1.2 mg/dL -  Alkaline Phos 38 - 126 U/L -  AST 15 - 41 U/L -  ALT 0 - 44 U/L -   CBC Latest Ref Rng & Units 12/06/2020  WBC 4.0 - 10.5 K/uL 5.7  Hemoglobin 12.0 - 15.0 g/dL 10.1(L)  Hematocrit  36.0 - 46.0 % 31.7(L)  Platelets 150 - 400 K/uL 198    Assessment and plan- Patient is a 86 y.o. female with history of iron B12 deficiency anemia here for routine follow-up  Patient's hemoglobin is remained stable around 10 for the last 1 year.Iron studies from today do reveal a mildly low level of ferritin 24 but she has had similar levels in the past as well.  She last  received IV iron back in April 2021 but this was only 1 dose.  I will hold off on giving her any further IV iron at this time and repeat her levels in 3 in 6 months.  If her hemoglobin drifts down to less than 10 with convincing evidence of iron deficiency I will give her a trial of IV iron in the time.  Repeat CBC ferritin and iron studies in 3 in 6 months and I will see her back in 6 months   Visit Diagnosis 1. Iron deficiency anemia, unspecified iron deficiency anemia type      Dr. Randa Evens, MD, MPH [facility] [phone] 12/06/2020 9:20 PM

## 2020-12-07 ENCOUNTER — Inpatient Hospital Stay: Payer: Medicare Other

## 2020-12-07 ENCOUNTER — Inpatient Hospital Stay: Payer: Medicare Other | Admitting: Oncology

## 2020-12-24 ENCOUNTER — Other Ambulatory Visit: Payer: Medicare Other

## 2020-12-24 ENCOUNTER — Ambulatory Visit: Payer: Medicare Other | Admitting: Oncology

## 2021-03-02 ENCOUNTER — Telehealth: Payer: Self-pay | Admitting: Oncology

## 2021-03-02 NOTE — Telephone Encounter (Signed)
Patient called to cancel her 6/24 lab appointment.  She is going out of town until August and will just see Dr. Janese Banks in September.

## 2021-03-04 ENCOUNTER — Inpatient Hospital Stay: Payer: Medicare Other

## 2021-06-10 ENCOUNTER — Inpatient Hospital Stay: Payer: Medicare Other | Admitting: Oncology

## 2021-06-10 ENCOUNTER — Other Ambulatory Visit: Payer: Self-pay | Admitting: *Deleted

## 2021-06-10 ENCOUNTER — Inpatient Hospital Stay: Payer: Medicare Other | Attending: Oncology

## 2021-06-10 VITALS — BP 158/87 | HR 71 | Temp 95.8°F | Resp 18 | Wt 148.4 lb

## 2021-06-10 DIAGNOSIS — Z992 Dependence on renal dialysis: Secondary | ICD-10-CM | POA: Diagnosis not present

## 2021-06-10 DIAGNOSIS — D509 Iron deficiency anemia, unspecified: Secondary | ICD-10-CM | POA: Insufficient documentation

## 2021-06-10 DIAGNOSIS — Z79899 Other long term (current) drug therapy: Secondary | ICD-10-CM | POA: Insufficient documentation

## 2021-06-10 DIAGNOSIS — Z888 Allergy status to other drugs, medicaments and biological substances status: Secondary | ICD-10-CM | POA: Insufficient documentation

## 2021-06-10 DIAGNOSIS — Z9049 Acquired absence of other specified parts of digestive tract: Secondary | ICD-10-CM | POA: Insufficient documentation

## 2021-06-10 DIAGNOSIS — I509 Heart failure, unspecified: Secondary | ICD-10-CM | POA: Diagnosis not present

## 2021-06-10 DIAGNOSIS — Z7901 Long term (current) use of anticoagulants: Secondary | ICD-10-CM | POA: Diagnosis not present

## 2021-06-10 DIAGNOSIS — Z8701 Personal history of pneumonia (recurrent): Secondary | ICD-10-CM | POA: Diagnosis not present

## 2021-06-10 DIAGNOSIS — R5383 Other fatigue: Secondary | ICD-10-CM | POA: Diagnosis not present

## 2021-06-10 DIAGNOSIS — K648 Other hemorrhoids: Secondary | ICD-10-CM | POA: Diagnosis not present

## 2021-06-10 DIAGNOSIS — Z885 Allergy status to narcotic agent status: Secondary | ICD-10-CM | POA: Diagnosis not present

## 2021-06-10 DIAGNOSIS — K644 Residual hemorrhoidal skin tags: Secondary | ICD-10-CM | POA: Insufficient documentation

## 2021-06-10 DIAGNOSIS — R718 Other abnormality of red blood cells: Secondary | ICD-10-CM | POA: Diagnosis not present

## 2021-06-10 DIAGNOSIS — Z886 Allergy status to analgesic agent status: Secondary | ICD-10-CM | POA: Diagnosis not present

## 2021-06-10 DIAGNOSIS — E538 Deficiency of other specified B group vitamins: Secondary | ICD-10-CM | POA: Insufficient documentation

## 2021-06-10 LAB — CBC
HCT: 36.6 % (ref 36.0–46.0)
Hemoglobin: 11.3 g/dL — ABNORMAL LOW (ref 12.0–15.0)
MCH: 24.7 pg — ABNORMAL LOW (ref 26.0–34.0)
MCHC: 30.9 g/dL (ref 30.0–36.0)
MCV: 80.1 fL (ref 80.0–100.0)
Platelets: 207 10*3/uL (ref 150–400)
RBC: 4.57 MIL/uL (ref 3.87–5.11)
RDW: 14.6 % (ref 11.5–15.5)
WBC: 7.5 10*3/uL (ref 4.0–10.5)
nRBC: 0 % (ref 0.0–0.2)

## 2021-06-10 LAB — VITAMIN B12: Vitamin B-12: 3683 pg/mL — ABNORMAL HIGH (ref 180–914)

## 2021-06-10 LAB — COMPREHENSIVE METABOLIC PANEL
ALT: 13 U/L (ref 0–44)
AST: 15 U/L (ref 15–41)
Albumin: 4.2 g/dL (ref 3.5–5.0)
Alkaline Phosphatase: 85 U/L (ref 38–126)
Anion gap: 10 (ref 5–15)
BUN: 35 mg/dL — ABNORMAL HIGH (ref 8–23)
CO2: 26 mmol/L (ref 22–32)
Calcium: 10.1 mg/dL (ref 8.9–10.3)
Chloride: 96 mmol/L — ABNORMAL LOW (ref 98–111)
Creatinine, Ser: 1.69 mg/dL — ABNORMAL HIGH (ref 0.44–1.00)
GFR, Estimated: 30 mL/min — ABNORMAL LOW (ref >60)
Glucose, Bld: 410 mg/dL — ABNORMAL HIGH (ref 70–99)
Potassium: 3.9 mmol/L (ref 3.5–5.1)
Sodium: 132 mmol/L — ABNORMAL LOW (ref 135–145)
Total Bilirubin: 0.7 mg/dL (ref 0.3–1.2)
Total Protein: 8 g/dL (ref 6.5–8.1)

## 2021-06-10 LAB — IRON AND TIBC
Iron: 54 ug/dL (ref 28–170)
Saturation Ratios: 12 % (ref 10.4–31.8)
TIBC: 462 ug/dL — ABNORMAL HIGH (ref 250–450)
UIBC: 408 ug/dL

## 2021-06-10 LAB — FERRITIN: Ferritin: 11 ng/mL (ref 11–307)

## 2021-06-10 NOTE — Progress Notes (Signed)
Pt has no concerns/complaints at this time. 

## 2021-06-10 NOTE — Progress Notes (Signed)
Lab added

## 2021-06-12 ENCOUNTER — Encounter: Payer: Self-pay | Admitting: Oncology

## 2021-06-12 NOTE — Progress Notes (Signed)
Hematology/Oncology Consult note [facility]  [phone] [phone]  Patient Care Team: Letta Median, MD as PCP - General (Family Medicine) Alisa Graff, FNP as Nurse Practitioner (Family Medicine) Isaias Cowman, MD as Consulting Physician (Cardiology) Sindy Guadeloupe, MD as Consulting Physician (Oncology) Erby Pian, MD as Referring Physician (Specialist) Jason Coop, NP as Nurse Practitioner   Name of the patient: Anatalia Kronk  [id number]  [birth year]   Date of visit: 06/12/21  Diagnosis- iron deficiency and b12 deficiency anemia    Chief complaint/ Reason for visit-routine follow-up of iron and B12 deficiency anemia  Heme/Onc history: patient is a 86 year old female who was seen by Dr. Ouida Sills 2016 deficiency anemia. At that time she had an EGD and a colonoscopy which did not reveal any evidence of bleeding. She didn't get better with oral iron and was eventually asked to follow-up with her primary care doctor. She does have a remote h/o Billroth I gastro duodenostomy   Patient was admitted to the hospital for severe allergy angioedema and was found to have a pneumonia and heart failure in April 2018. Her hemoglobin was 7.3 with an MCV of 68. She has received 2 doses of venofer 200 mg along with one unit of blood transfusion. She was seen by Dr. Alice Reichert and underwent EGD and colonoscopy in October 2018. EGD showed evidence of billroth I but ws otherwise normal. Colonoscopy showed diverticulosis, internal and external hemorrhoids and 1 polyp (tubular adenoma). No active bleeding   Iron studies from 12/19/2016 showed a low serum iron of 13 and low iron saturation of 3% and normal TIBC of 426. Ferritin was low at 9 and B12 was normal at 151. Patient   Interval history-patient has baseline fatigue which is unchanged.  Denies any blood loss in her stool or urine.  Denies any dark melanotic stools.  ECOG  PS- 2 Pain scale- 0   Review of systems- Review of Systems  Constitutional:  Positive for malaise/fatigue. Negative for chills, fever and weight loss.  HENT:  Negative for congestion, ear discharge and nosebleeds.   Eyes:  Negative for blurred vision.  Respiratory:  Negative for cough, hemoptysis, sputum production, shortness of breath and wheezing.   Cardiovascular:  Negative for chest pain, palpitations, orthopnea and claudication.  Gastrointestinal:  Negative for abdominal pain, blood in stool, constipation, diarrhea, heartburn, melena, nausea and vomiting.  Genitourinary:  Negative for dysuria, flank pain, frequency, hematuria and urgency.  Musculoskeletal:  Negative for back pain, joint pain and myalgias.  Skin:  Negative for rash.  Neurological:  Negative for dizziness, tingling, focal weakness, seizures, weakness and headaches.  Endo/Heme/Allergies:  Does not bruise/bleed easily.  Psychiatric/Behavioral:  Negative for depression and suicidal ideas. The patient does not have insomnia.      Allergies  Allergen Reactions   Tramadol Anaphylaxis   Aspirin     "Stomach problems"     Past Medical History:  Diagnosis Date   Anemia    Arthritis    hands, knees, back   Atrial fibrillation (HCC)    CHF (congestive heart failure) (HCC)    COPD (chronic obstructive pulmonary disease) (HCC)    Diabetes mellitus without complication (HCC)    Dyspnea    GERD (gastroesophageal reflux disease)    Hyperlipidemia    Hypertension    PONV (postoperative nausea and vomiting)    Sleep apnea    told she needs CPAP, does not have   Wears dentures  full upper and lower.  only wears upper     Past Surgical History:  Procedure Laterality Date   ABDOMINAL HYSTERECTOMY     CATARACT EXTRACTION W/PHACO Right 07/12/2016   Procedure: CATARACT EXTRACTION PHACO AND INTRAOCULAR LENS PLACEMENT (IOC);  Surgeon: Leandrew Koyanagi, MD;  Location: [place];  Service: Ophthalmology;   Laterality: Right;  DIABETIC - insulin and oral meds sleep apnea   CATARACT EXTRACTION W/PHACO Left 04/09/2019   Procedure: CATARACT EXTRACTION PHACO AND INTRAOCULAR LENS PLACEMENT ([place])  LEFT DIABETIC;  Surgeon: Leandrew Koyanagi, MD;  Location: [place];  Service: Ophthalmology;  Laterality: Left;  Diabetic - insulin and oral meds   CHOLECYSTECTOMY     COLONOSCOPY N/A 01/19/2015   Procedure: COLONOSCOPY;  Surgeon: Josefine Class, MD;  Location: [facility] ENDOSCOPY;  Service: Endoscopy;  Laterality: N/A;   COLONOSCOPY WITH PROPOFOL N/A 07/11/2017   Procedure: COLONOSCOPY WITH PROPOFOL;  Surgeon: Toledo, Benay Pike, MD;  Location: [facility] ENDOSCOPY;  Service: Gastroenterology;  Laterality: N/A;   DIALYSIS/PERMA CATHETER INSERTION N/A 01/19/2020   Procedure: DIALYSIS/PERMA CATHETER INSERTION;  Surgeon: Algernon Huxley, MD;  Location: [place] CV LAB;  Service: Cardiovascular;  Laterality: N/A;   DIALYSIS/PERMA CATHETER REMOVAL N/A 03/18/2020   Procedure: DIALYSIS/PERMA CATHETER REMOVAL;  Surgeon: Algernon Huxley, MD;  Location: [place] CV LAB;  Service: Cardiovascular;  Laterality: N/A;   ESOPHAGOGASTRODUODENOSCOPY N/A 01/19/2015   Procedure: ESOPHAGOGASTRODUODENOSCOPY (EGD);  Surgeon: Josefine Class, MD;  Location: [facility] ENDOSCOPY;  Service: Endoscopy;  Laterality: N/A;   ESOPHAGOGASTRODUODENOSCOPY (EGD) WITH PROPOFOL N/A 07/11/2017   Procedure: ESOPHAGOGASTRODUODENOSCOPY (EGD) WITH PROPOFOL;  Surgeon: Toledo, Benay Pike, MD;  Location: [facility] ENDOSCOPY;  Service: Gastroenterology;  Laterality: N/A;   SHOULDER ARTHROSCOPY      Social History   Socioeconomic History   Marital status: Widowed    Spouse name: Not on file   Number of children: Not on file   Years of education: Not on file   Highest education level: Not on file  Occupational History   Not on file  Tobacco Use   Smoking status: Never   Smokeless tobacco: Never  Vaping Use   Vaping Use: Never used  Substance and  Sexual Activity   Alcohol use: No   Drug use: Never   Sexual activity: Not on file  Other Topics Concern   Not on file  Social History Narrative   Not on file   Social Determinants of Health   Financial Resource Strain: Not on file  Food Insecurity: Not on file  Transportation Needs: Not on file  Physical Activity: Not on file  Stress: Not on file  Social Connections: Not on file  Intimate Partner Violence: Not on file    No family history on file.   Current Outpatient Medications:    acetaminophen (TYLENOL) 500 MG tablet, Take 1,000 mg by mouth every 6 (six) hours as needed for moderate pain or headache., Disp: , Rfl:    albuterol (VENTOLIN HFA) 108 (90 Base) MCG/ACT inhaler, Inhale 2 puffs into the lungs every 6 (six) hours as needed for wheezing or shortness of breath., Disp: 6.7 g, Rfl: 3   amLODipine (NORVASC) 10 MG tablet, Take 10 mg by mouth daily., Disp: , Rfl:    apixaban (ELIQUIS) 2.5 MG TABS tablet, Take 2.5 mg by mouth 2 (two) times daily. , Disp: , Rfl:    atorvastatin (LIPITOR) 80 MG tablet, Take 80 mg by mouth daily., Disp: , Rfl:    Calcium Carb-Cholecalciferol (CALCIUM 600 +  D PO), Take 1 tablet by mouth in the morning and at bedtime. , Disp: , Rfl:    empagliflozin (JARDIANCE) 10 MG TABS tablet, , Disp: , Rfl:    esomeprazole (NEXIUM) 20 MG capsule, Take 20 mg by mouth at bedtime., Disp: , Rfl:    ipratropium-albuterol (DUONEB) 0.5-2.5 (3) MG/3ML SOLN, Take 3 mLs by nebulization 3 (three) times daily., Disp: 360 mL, Rfl: 3   mirtazapine (REMERON) 15 MG tablet, Take 15 mg by mouth at bedtime., Disp: , Rfl:    vitamin B-12 (CYANOCOBALAMIN) 100 MCG tablet, Take 100 mcg by mouth daily., Disp: , Rfl:    ferrous sulfate 325 (65 FE) MG tablet, Take 325 mg by mouth daily with breakfast. (Patient not taking: Reported on 06/10/2021), Disp: , Rfl:    Ipratropium-Albuterol (COMBIVENT RESPIMAT) 20-100 MCG/ACT AERS respimat, Inhale 1 puff into the lungs 3 (three) times daily.  (Patient not taking: No sig reported), Disp: , Rfl:    multivitamin (RENA-VIT) TABS tablet, Take 1 tablet by mouth at bedtime. (Patient not taking: No sig reported), Disp: 30 tablet, Rfl: 0   sodium chloride (OCEAN) 0.65 % SOLN nasal spray, Place 1 spray into both nostrils as needed for congestion. (Patient not taking: No sig reported), Disp: 15 mL, Rfl: 0  Physical exam:  Vitals:   06/10/21 1015  BP: (!) 158/87  Pulse: 71  Resp: 18  Temp: (!) 95.8 F (35.4 C)  TempSrc: Tympanic  SpO2: 96%  Weight: 148 lb 6.4 oz (67.3 kg)   Physical Exam Constitutional:      General: She is not in acute distress.    Comments: Ambulates with a walker.  Appears in no acute distress  HENT:     Head: Normocephalic.  Cardiovascular:     Rate and Rhythm: Normal rate and regular rhythm.     Heart sounds: Normal heart sounds.  Pulmonary:     Effort: Pulmonary effort is normal.     Breath sounds: Normal breath sounds.  Abdominal:     General: Bowel sounds are normal.     Palpations: Abdomen is soft.  Skin:    General: Skin is warm and dry.  Neurological:     Mental Status: She is alert and oriented to person, place, and time.     CMP Latest Ref Rng & Units 06/10/2021  Glucose 70 - 99 mg/dL 410(H)  BUN 8 - 23 mg/dL 35(H)  Creatinine 0.44 - 1.00 mg/dL 1.69(H)  Sodium 135 - 145 mmol/L 132(L)  Potassium 3.5 - 5.1 mmol/L 3.9  Chloride 98 - 111 mmol/L 96(L)  CO2 22 - 32 mmol/L 26  Calcium 8.9 - 10.3 mg/dL 10.1  Total Protein 6.5 - 8.1 g/dL 8.0  Total Bilirubin 0.3 - 1.2 mg/dL 0.7  Alkaline Phos 38 - 126 U/L 85  AST 15 - 41 U/L 15  ALT 0 - 44 U/L 13   CBC Latest Ref Rng & Units 06/10/2021  WBC 4.0 - 10.5 K/uL 7.5  Hemoglobin 12.0 - 15.0 g/dL 11.3(L)  Hematocrit 36.0 - 46.0 % 36.6  Platelets 150 - 400 K/uL 207    Assessment and plan- Patient is a 86 y.o. female with iron deficiency anemia here for routine follow-up:.    Patient last received IV iron back in April 2021. She is not currently  taking any oral iron.  Her hemoglobin presently is improved to 11.3 although she does have borderline microcytosis.  After patient left the clinicHer ferritin came down at 11 which has been  steadily going down since a year ago.  Iron saturation is low at 12% with elevated TIBC.  It would be reasonable to offer her IV iron at this time for her iron deficiency although her hemoglobin is improved.  We will reach out to patient's family if she is able to come for IV iron.  Otherwise have advised her to take oral iron every other day.  CBC ferritin and iron studies and CMP in 6 months and I will see her thereafter   Visit Diagnosis 1. Iron deficiency anemia, unspecified iron deficiency anemia type      Dr. Randa Evens, MD, MPH [facility] [phone] 06/12/2021 6:23 AM

## 2021-06-13 ENCOUNTER — Other Ambulatory Visit: Payer: Self-pay | Admitting: Oncology

## 2021-06-13 ENCOUNTER — Telehealth: Payer: Self-pay | Admitting: Oncology

## 2021-06-13 NOTE — Telephone Encounter (Signed)
Left VM with patient to set up Venofer x 5. Requested she call back.

## 2021-06-17 NOTE — Telephone Encounter (Signed)
Thank you Jerene Pitch! Dr. Janese Banks, patient's granddaughter would like to know if possible to try oral iron (she said this was discussed at her last appointment- I did let her know that when you saw her on 9/30 her labs had not yet resulted. She has so many appointments and according to her it "wipes her out for days and she is a hard stick".

## 2021-06-17 NOTE — Telephone Encounter (Signed)
Granddaughter returned call to schedule Venofer x 5. Would like to know why she needs the infusions all of a sudden. She was only able to schedule 3 Venofers as the patient is suppose to go visit her daughter in [place] the week of 11/11. She could not bring patient next week to start either.

## 2021-06-18 NOTE — Telephone Encounter (Signed)
Yes ok for her to try orasl iron every other day for 3 months

## 2021-06-20 NOTE — Telephone Encounter (Signed)
I called and got I touch with grand daughter and told her that she can do oral iron pills (325 mg) and every other day and please eat food first before taking iron pills. Dr Janese Banks wants her to come back to see md in 3 months which will be jan.  Appts for 10/07/21 11 am labs and md at 11:30. [place] daughter ok with that time and date and mailed the appts also

## 2021-06-28 ENCOUNTER — Encounter: Payer: Self-pay | Admitting: Oncology

## 2021-07-01 ENCOUNTER — Ambulatory Visit: Payer: Medicare Other

## 2021-07-08 ENCOUNTER — Ambulatory Visit: Payer: Medicare Other

## 2021-07-15 ENCOUNTER — Ambulatory Visit: Payer: Medicare Other

## 2021-09-28 ENCOUNTER — Ambulatory Visit: Payer: Medicare Other | Admitting: Oncology

## 2021-09-28 ENCOUNTER — Other Ambulatory Visit: Payer: Medicare Other

## 2021-09-28 ENCOUNTER — Other Ambulatory Visit: Payer: Self-pay | Admitting: *Deleted

## 2021-09-28 DIAGNOSIS — D509 Iron deficiency anemia, unspecified: Secondary | ICD-10-CM

## 2021-10-07 ENCOUNTER — Encounter: Payer: Self-pay | Admitting: Oncology

## 2021-10-07 ENCOUNTER — Other Ambulatory Visit: Payer: Self-pay

## 2021-10-07 ENCOUNTER — Inpatient Hospital Stay: Payer: Medicare Other | Admitting: Oncology

## 2021-10-07 ENCOUNTER — Inpatient Hospital Stay: Payer: Medicare Other | Attending: Oncology

## 2021-10-07 VITALS — BP 155/64 | HR 72 | Temp 98.7°F | Resp 19 | Wt 153.9 lb

## 2021-10-07 DIAGNOSIS — D509 Iron deficiency anemia, unspecified: Secondary | ICD-10-CM

## 2021-10-07 LAB — COMPREHENSIVE METABOLIC PANEL
ALT: 11 U/L (ref 0–44)
AST: 13 U/L — ABNORMAL LOW (ref 15–41)
Albumin: 4 g/dL (ref 3.5–5.0)
Alkaline Phosphatase: 70 U/L (ref 38–126)
Anion gap: 10 (ref 5–15)
BUN: 33 mg/dL — ABNORMAL HIGH (ref 8–23)
CO2: 21 mmol/L — ABNORMAL LOW (ref 22–32)
Calcium: 9.5 mg/dL (ref 8.9–10.3)
Chloride: 108 mmol/L (ref 98–111)
Creatinine, Ser: 1.52 mg/dL — ABNORMAL HIGH (ref 0.44–1.00)
GFR, Estimated: 34 mL/min — ABNORMAL LOW (ref >60)
Glucose, Bld: 143 mg/dL — ABNORMAL HIGH (ref 70–99)
Potassium: 3.9 mmol/L (ref 3.5–5.1)
Sodium: 139 mmol/L (ref 135–145)
Total Bilirubin: 0.8 mg/dL (ref 0.3–1.2)
Total Protein: 7.9 g/dL (ref 6.5–8.1)

## 2021-10-07 LAB — IRON AND TIBC
Iron: 40 ug/dL (ref 28–170)
Saturation Ratios: 10 % — ABNORMAL LOW (ref 10.4–31.8)
TIBC: 391 ug/dL (ref 250–450)
UIBC: 351 ug/dL

## 2021-10-07 LAB — FERRITIN: Ferritin: 30 ng/mL (ref 11–307)

## 2021-10-07 LAB — CBC
HCT: 37.5 % (ref 36.0–46.0)
Hemoglobin: 11.8 g/dL — ABNORMAL LOW (ref 12.0–15.0)
MCH: 27.6 pg (ref 26.0–34.0)
MCHC: 31.5 g/dL (ref 30.0–36.0)
MCV: 87.8 fL (ref 80.0–100.0)
Platelets: 207 10*3/uL (ref 150–400)
RBC: 4.27 MIL/uL (ref 3.87–5.11)
RDW: 14.9 % (ref 11.5–15.5)
WBC: 6.9 10*3/uL (ref 4.0–10.5)
nRBC: 0 % (ref 0.0–0.2)

## 2021-10-07 LAB — VITAMIN B12: Vitamin B-12: 1459 pg/mL — ABNORMAL HIGH (ref 180–914)

## 2021-10-07 NOTE — Progress Notes (Signed)
Hematology/Oncology Consult note [facility]  [phone] [phone]  Patient Care Team: Letta Median, MD as PCP - General (Family Medicine) Alisa Graff, FNP as Nurse Practitioner (Family Medicine) Isaias Cowman, MD as Consulting Physician (Cardiology) Sindy Guadeloupe, MD as Consulting Physician (Oncology) Erby Pian, MD as Referring Physician (Specialist) Jason Coop, NP as Nurse Practitioner   Name of the patient: Dorothy [name]  [id number]  April 25, 1935   Date of visit: 10/07/21  Diagnosis-iron deficiency anemia  Chief complaint/ Reason for visit-routine follow-up of iron deficiency anemia  Heme/Onc history: patient is a 86 year old female who was seen by Dr. Ouida Sills 2016 deficiency anemia. At that time she had an EGD and a colonoscopy which did not reveal any evidence of bleeding. She didn't get better with oral iron and was eventually asked to follow-up with her primary care doctor. She does have a remote h/o Billroth I gastro duodenostomy   Patient was admitted to the hospital for severe allergy angioedema and was found to have a pneumonia and heart failure in April 2018. Her hemoglobin was 7.3 with an MCV of 68. She has received 2 doses of venofer 200 mg along with one unit of blood transfusion. She was seen by Dr. Alice Reichert and underwent EGD and colonoscopy in October 2018. EGD showed evidence of billroth I but ws otherwise normal. Colonoscopy showed diverticulosis, internal and external hemorrhoids and 1 polyp (tubular adenoma). No active bleeding   Iron studies from 12/19/2016 showed a low serum iron of 13 and low iron saturation of 3% and normal TIBC of 426. Ferritin was low at 9 and B12 was normal at 151. Patient has received venofer and B12 injections in the past    Interval history-patient is doing well for her age.  She is taking her oral iron every other day.  She remains independent of her  ADLs and lives alone.  Other than mild baseline fatigue she denies other complaints at this time.  Denies any blood loss in her stool or urine  ECOG PS- 2 Pain scale- 0   Review of systems- Review of Systems  Constitutional:  Positive for malaise/fatigue. Negative for chills, fever and weight loss.  HENT:  Negative for congestion, ear discharge and nosebleeds.   Eyes:  Negative for blurred vision.  Respiratory:  Negative for cough, hemoptysis, sputum production, shortness of breath and wheezing.   Cardiovascular:  Negative for chest pain, palpitations, orthopnea and claudication.  Gastrointestinal:  Negative for abdominal pain, blood in stool, constipation, diarrhea, heartburn, melena, nausea and vomiting.  Genitourinary:  Negative for dysuria, flank pain, frequency, hematuria and urgency.  Musculoskeletal:  Negative for back pain, joint pain and myalgias.  Skin:  Negative for rash.  Neurological:  Negative for dizziness, tingling, focal weakness, seizures, weakness and headaches.  Endo/Heme/Allergies:  Does not bruise/bleed easily.  Psychiatric/Behavioral:  Negative for depression and suicidal ideas. The patient does not have insomnia.      Allergies  Allergen Reactions   Tramadol Anaphylaxis   Aspirin     "Stomach problems"     Past Medical History:  Diagnosis Date   Anemia    Arthritis    hands, knees, back   Atrial fibrillation (HCC)    CHF (congestive heart failure) (HCC)    COPD (chronic obstructive pulmonary disease) (HCC)    Diabetes mellitus without complication (HCC)    Dyspnea    GERD (gastroesophageal reflux disease)    Hyperlipidemia    Hypertension  PONV (postoperative nausea and vomiting)    Sleep apnea    told she needs CPAP, does not have   Wears dentures    full upper and lower.  only wears upper     Past Surgical History:  Procedure Laterality Date   ABDOMINAL HYSTERECTOMY     CATARACT EXTRACTION W/PHACO Right 07/12/2016   Procedure: CATARACT  EXTRACTION PHACO AND INTRAOCULAR LENS PLACEMENT (IOC);  Surgeon: Leandrew Koyanagi, MD;  Location: [place];  Service: Ophthalmology;  Laterality: Right;  DIABETIC - insulin and oral meds sleep apnea   CATARACT EXTRACTION W/PHACO Left 04/09/2019   Procedure: CATARACT EXTRACTION PHACO AND INTRAOCULAR LENS PLACEMENT ([place])  LEFT DIABETIC;  Surgeon: Leandrew Koyanagi, MD;  Location: [place];  Service: Ophthalmology;  Laterality: Left;  Diabetic - insulin and oral meds   CHOLECYSTECTOMY     COLONOSCOPY N/A 01/19/2015   Procedure: COLONOSCOPY;  Surgeon: Josefine Class, MD;  Location: [facility] ENDOSCOPY;  Service: Endoscopy;  Laterality: N/A;   COLONOSCOPY WITH PROPOFOL N/A 07/11/2017   Procedure: COLONOSCOPY WITH PROPOFOL;  Surgeon: Toledo, Benay Pike, MD;  Location: [facility] ENDOSCOPY;  Service: Gastroenterology;  Laterality: N/A;   DIALYSIS/PERMA CATHETER INSERTION N/A 01/19/2020   Procedure: DIALYSIS/PERMA CATHETER INSERTION;  Surgeon: Algernon Huxley, MD;  Location: [place] CV LAB;  Service: Cardiovascular;  Laterality: N/A;   DIALYSIS/PERMA CATHETER REMOVAL N/A 03/18/2020   Procedure: DIALYSIS/PERMA CATHETER REMOVAL;  Surgeon: Algernon Huxley, MD;  Location: [place] CV LAB;  Service: Cardiovascular;  Laterality: N/A;   ESOPHAGOGASTRODUODENOSCOPY N/A 01/19/2015   Procedure: ESOPHAGOGASTRODUODENOSCOPY (EGD);  Surgeon: Josefine Class, MD;  Location: [facility] ENDOSCOPY;  Service: Endoscopy;  Laterality: N/A;   ESOPHAGOGASTRODUODENOSCOPY (EGD) WITH PROPOFOL N/A 07/11/2017   Procedure: ESOPHAGOGASTRODUODENOSCOPY (EGD) WITH PROPOFOL;  Surgeon: Toledo, Benay Pike, MD;  Location: [facility] ENDOSCOPY;  Service: Gastroenterology;  Laterality: N/A;   SHOULDER ARTHROSCOPY      Social History   Socioeconomic History   Marital status: Widowed    Spouse name: Not on file   Number of children: Not on file   Years of education: Not on file   Highest education level: Not on file   Occupational History   Not on file  Tobacco Use   Smoking status: Never   Smokeless tobacco: Never  Vaping Use   Vaping Use: Never used  Substance and Sexual Activity   Alcohol use: No   Drug use: Never   Sexual activity: Not on file  Other Topics Concern   Not on file  Social History Narrative   Not on file   Social Determinants of Health   Financial Resource Strain: Not on file  Food Insecurity: Not on file  Transportation Needs: Not on file  Physical Activity: Not on file  Stress: Not on file  Social Connections: Not on file  Intimate Partner Violence: Not on file    History reviewed. No pertinent family history.   Current Outpatient Medications:    acetaminophen (TYLENOL) 500 MG tablet, Take 1,000 mg by mouth every 6 (six) hours as needed for moderate pain or headache., Disp: , Rfl:    albuterol (VENTOLIN HFA) 108 (90 Base) MCG/ACT inhaler, Inhale 2 puffs into the lungs every 6 (six) hours as needed for wheezing or shortness of breath., Disp: 6.7 g, Rfl: 3   amLODipine (NORVASC) 10 MG tablet, Take 10 mg by mouth daily., Disp: , Rfl:    apixaban (ELIQUIS) 2.5 MG TABS tablet, Take 2.5 mg by mouth 2 (two) times daily. ,  Disp: , Rfl:    atorvastatin (LIPITOR) 80 MG tablet, Take 80 mg by mouth daily., Disp: , Rfl:    Calcium Carb-Cholecalciferol (CALCIUM 600 + D PO), Take 1 tablet by mouth in the morning and at bedtime. , Disp: , Rfl:    empagliflozin (JARDIANCE) 10 MG TABS tablet, , Disp: , Rfl:    esomeprazole (NEXIUM) 20 MG capsule, Take 20 mg by mouth at bedtime., Disp: , Rfl:    ferrous sulfate 325 (65 FE) MG tablet, Take 325 mg by mouth daily with breakfast., Disp: , Rfl:    ipratropium-albuterol (DUONEB) 0.5-2.5 (3) MG/3ML SOLN, Take 3 mLs by nebulization 3 (three) times daily., Disp: 360 mL, Rfl: 3   vitamin B-12 (CYANOCOBALAMIN) 100 MCG tablet, Take 100 mcg by mouth daily., Disp: , Rfl:    Ipratropium-Albuterol (COMBIVENT RESPIMAT) 20-100 MCG/ACT AERS respimat,  Inhale 1 puff into the lungs 3 (three) times daily. (Patient not taking: Reported on 12/06/2020), Disp: , Rfl:    mirtazapine (REMERON) 15 MG tablet, Take 15 mg by mouth at bedtime. (Patient not taking: Reported on 10/07/2021), Disp: , Rfl:    multivitamin (RENA-VIT) TABS tablet, Take 1 tablet by mouth at bedtime. (Patient not taking: Reported on 12/06/2020), Disp: 30 tablet, Rfl: 0   sodium chloride (OCEAN) 0.65 % SOLN nasal spray, Place 1 spray into both nostrils as needed for congestion. (Patient not taking: Reported on 12/06/2020), Disp: 15 mL, Rfl: 0  Physical exam:  Vitals:   10/07/21 1120  BP: (!) 155/64  Pulse: 72  Resp: 19  Temp: 98.7 F (37.1 C)  SpO2: 98%  Weight: 153 lb 14.4 oz (69.8 kg)   Physical Exam Cardiovascular:     Rate and Rhythm: Normal rate and regular rhythm.     Heart sounds: Normal heart sounds.  Pulmonary:     Effort: Pulmonary effort is normal.     Breath sounds: Normal breath sounds.  Abdominal:     General: Bowel sounds are normal.     Palpations: Abdomen is soft.  Skin:    General: Skin is warm and dry.  Neurological:     Mental Status: She is alert and oriented to person, place, and time.     CMP Latest Ref Rng & Units 10/07/2021  Glucose 70 - 99 mg/dL 143(H)  BUN 8 - 23 mg/dL 33(H)  Creatinine 0.44 - 1.00 mg/dL 1.52(H)  Sodium 135 - 145 mmol/L 139  Potassium 3.5 - 5.1 mmol/L 3.9  Chloride 98 - 111 mmol/L 108  CO2 22 - 32 mmol/L 21(L)  Calcium 8.9 - 10.3 mg/dL 9.5  Total Protein 6.5 - 8.1 g/dL 7.9  Total Bilirubin 0.3 - 1.2 mg/dL 0.8  Alkaline Phos 38 - 126 U/L 70  AST 15 - 41 U/L 13(L)  ALT 0 - 44 U/L 11   CBC Latest Ref Rng & Units 10/07/2021  WBC 4.0 - 10.5 K/uL 6.9  Hemoglobin 12.0 - 15.0 g/dL 11.8(L)  Hematocrit 36.0 - 46.0 % 37.5  Platelets 150 - 400 K/uL 207     Assessment and plan- Patient is a 87 y.o. female with history of iron deficiency anemia here for routine follow-up  Patient's hemoglobin is improved today at  11.8.Ferritin is also better at 30 as compared to 11 3 months ago iron saturation is still low at 10%.  Patient prefers to avoid IV iron.  Given that her hemoglobin remained stable and she has not required IV iron for over a year and a half she can  continue to follow-up with Dr. Rebeca Alert at this time and get CBC ferritin and iron studies checked every 6 months.  She does not require a follow-up with me at this time.  However if she gets progressively anemic she can be referred to [name] in the future if questions or concerns arise   Visit Diagnosis 1. Iron deficiency anemia, unspecified iron deficiency anemia type      Dr. Randa Evens, MD, MPH [facility] [phone] 10/07/2021 4:03 PM

## 2021-12-09 ENCOUNTER — Ambulatory Visit: Payer: Medicare Other | Admitting: Oncology

## 2021-12-09 ENCOUNTER — Other Ambulatory Visit: Payer: Medicare Other

## 2022-04-21 ENCOUNTER — Inpatient Hospital Stay
Admission: EM | Admit: 2022-04-21 | Discharge: 2022-05-12 | DRG: 291 | Disposition: E | Payer: Medicare Other | Attending: Internal Medicine | Admitting: Internal Medicine

## 2022-04-21 ENCOUNTER — Encounter: Payer: Self-pay | Admitting: Oncology

## 2022-04-21 ENCOUNTER — Inpatient Hospital Stay: Payer: Medicare Other

## 2022-04-21 ENCOUNTER — Emergency Department: Payer: Medicare Other

## 2022-04-21 ENCOUNTER — Other Ambulatory Visit: Payer: Self-pay

## 2022-04-21 DIAGNOSIS — Z66 Do not resuscitate: Secondary | ICD-10-CM | POA: Diagnosis not present

## 2022-04-21 DIAGNOSIS — G4730 Sleep apnea, unspecified: Secondary | ICD-10-CM | POA: Diagnosis present

## 2022-04-21 DIAGNOSIS — E785 Hyperlipidemia, unspecified: Secondary | ICD-10-CM | POA: Diagnosis present

## 2022-04-21 DIAGNOSIS — G928 Other toxic encephalopathy: Secondary | ICD-10-CM | POA: Diagnosis present

## 2022-04-21 DIAGNOSIS — G9340 Encephalopathy, unspecified: Principal | ICD-10-CM

## 2022-04-21 DIAGNOSIS — Z7901 Long term (current) use of anticoagulants: Secondary | ICD-10-CM

## 2022-04-21 DIAGNOSIS — R54 Age-related physical debility: Secondary | ICD-10-CM | POA: Diagnosis present

## 2022-04-21 DIAGNOSIS — I480 Paroxysmal atrial fibrillation: Secondary | ICD-10-CM | POA: Diagnosis present

## 2022-04-21 DIAGNOSIS — E872 Acidosis, unspecified: Secondary | ICD-10-CM | POA: Diagnosis present

## 2022-04-21 DIAGNOSIS — Z885 Allergy status to narcotic agent status: Secondary | ICD-10-CM

## 2022-04-21 DIAGNOSIS — N1832 Chronic kidney disease, stage 3b: Secondary | ICD-10-CM | POA: Diagnosis present

## 2022-04-21 DIAGNOSIS — Z20822 Contact with and (suspected) exposure to covid-19: Secondary | ICD-10-CM | POA: Diagnosis present

## 2022-04-21 DIAGNOSIS — E11649 Type 2 diabetes mellitus with hypoglycemia without coma: Secondary | ICD-10-CM | POA: Diagnosis present

## 2022-04-21 DIAGNOSIS — I081 Rheumatic disorders of both mitral and tricuspid valves: Secondary | ICD-10-CM | POA: Diagnosis present

## 2022-04-21 DIAGNOSIS — N390 Urinary tract infection, site not specified: Secondary | ICD-10-CM | POA: Diagnosis present

## 2022-04-21 DIAGNOSIS — Z794 Long term (current) use of insulin: Secondary | ICD-10-CM | POA: Diagnosis not present

## 2022-04-21 DIAGNOSIS — I5033 Acute on chronic diastolic (congestive) heart failure: Secondary | ICD-10-CM | POA: Diagnosis present

## 2022-04-21 DIAGNOSIS — I5031 Acute diastolic (congestive) heart failure: Secondary | ICD-10-CM

## 2022-04-21 DIAGNOSIS — D509 Iron deficiency anemia, unspecified: Secondary | ICD-10-CM | POA: Diagnosis present

## 2022-04-21 DIAGNOSIS — J9601 Acute respiratory failure with hypoxia: Secondary | ICD-10-CM | POA: Diagnosis present

## 2022-04-21 DIAGNOSIS — Z886 Allergy status to analgesic agent status: Secondary | ICD-10-CM

## 2022-04-21 DIAGNOSIS — J9691 Respiratory failure, unspecified with hypoxia: Secondary | ICD-10-CM | POA: Diagnosis present

## 2022-04-21 DIAGNOSIS — Z6832 Body mass index (BMI) 32.0-32.9, adult: Secondary | ICD-10-CM | POA: Diagnosis not present

## 2022-04-21 DIAGNOSIS — I130 Hypertensive heart and chronic kidney disease with heart failure and stage 1 through stage 4 chronic kidney disease, or unspecified chronic kidney disease: Principal | ICD-10-CM | POA: Diagnosis present

## 2022-04-21 DIAGNOSIS — Z515 Encounter for palliative care: Secondary | ICD-10-CM | POA: Diagnosis not present

## 2022-04-21 DIAGNOSIS — J449 Chronic obstructive pulmonary disease, unspecified: Secondary | ICD-10-CM | POA: Diagnosis present

## 2022-04-21 DIAGNOSIS — Z8673 Personal history of transient ischemic attack (TIA), and cerebral infarction without residual deficits: Secondary | ICD-10-CM | POA: Diagnosis not present

## 2022-04-21 DIAGNOSIS — M1990 Unspecified osteoarthritis, unspecified site: Secondary | ICD-10-CM | POA: Diagnosis present

## 2022-04-21 DIAGNOSIS — I493 Ventricular premature depolarization: Secondary | ICD-10-CM | POA: Diagnosis present

## 2022-04-21 DIAGNOSIS — G931 Anoxic brain damage, not elsewhere classified: Secondary | ICD-10-CM | POA: Diagnosis present

## 2022-04-21 DIAGNOSIS — Z90710 Acquired absence of both cervix and uterus: Secondary | ICD-10-CM

## 2022-04-21 DIAGNOSIS — Z79899 Other long term (current) drug therapy: Secondary | ICD-10-CM | POA: Diagnosis not present

## 2022-04-21 DIAGNOSIS — Z9981 Dependence on supplemental oxygen: Secondary | ICD-10-CM

## 2022-04-21 DIAGNOSIS — K219 Gastro-esophageal reflux disease without esophagitis: Secondary | ICD-10-CM | POA: Diagnosis present

## 2022-04-21 DIAGNOSIS — R627 Adult failure to thrive: Secondary | ICD-10-CM | POA: Diagnosis present

## 2022-04-21 DIAGNOSIS — N184 Chronic kidney disease, stage 4 (severe): Secondary | ICD-10-CM

## 2022-04-21 DIAGNOSIS — I5023 Acute on chronic systolic (congestive) heart failure: Secondary | ICD-10-CM

## 2022-04-21 DIAGNOSIS — N189 Chronic kidney disease, unspecified: Secondary | ICD-10-CM | POA: Diagnosis present

## 2022-04-21 DIAGNOSIS — E1122 Type 2 diabetes mellitus with diabetic chronic kidney disease: Secondary | ICD-10-CM | POA: Diagnosis present

## 2022-04-21 DIAGNOSIS — E119 Type 2 diabetes mellitus without complications: Secondary | ICD-10-CM

## 2022-04-21 LAB — CBC WITH DIFFERENTIAL/PLATELET
Abs Immature Granulocytes: 0.06 10*3/uL (ref 0.00–0.07)
Basophils Absolute: 0 10*3/uL (ref 0.0–0.1)
Basophils Relative: 0 %
Eosinophils Absolute: 0 10*3/uL (ref 0.0–0.5)
Eosinophils Relative: 0 %
HCT: 44 % (ref 36.0–46.0)
Hemoglobin: 12.8 g/dL (ref 12.0–15.0)
Immature Granulocytes: 1 %
Lymphocytes Relative: 3 %
Lymphs Abs: 0.3 10*3/uL — ABNORMAL LOW (ref 0.7–4.0)
MCH: 27.2 pg (ref 26.0–34.0)
MCHC: 29.1 g/dL — ABNORMAL LOW (ref 30.0–36.0)
MCV: 93.6 fL (ref 80.0–100.0)
Monocytes Absolute: 0.4 10*3/uL (ref 0.1–1.0)
Monocytes Relative: 4 %
Neutro Abs: 10.3 10*3/uL — ABNORMAL HIGH (ref 1.7–7.7)
Neutrophils Relative %: 92 %
Platelets: 200 10*3/uL (ref 150–400)
RBC: 4.7 MIL/uL (ref 3.87–5.11)
RDW: 15.2 % (ref 11.5–15.5)
WBC: 11.1 10*3/uL — ABNORMAL HIGH (ref 4.0–10.5)
nRBC: 0 % (ref 0.0–0.2)

## 2022-04-21 LAB — BLOOD GAS, ARTERIAL
Acid-base deficit: 0.7 mmol/L (ref 0.0–2.0)
Bicarbonate: 24.2 mmol/L (ref 20.0–28.0)
FIO2: 40 %
MECHVT: 450 mL
Mechanical Rate: 18
O2 Saturation: 84.5 %
PEEP: 5 cmH2O
Patient temperature: 37
pCO2 arterial: 40 mmHg (ref 32–48)
pH, Arterial: 7.39 (ref 7.35–7.45)
pO2, Arterial: 51 mmHg — ABNORMAL LOW (ref 83–108)

## 2022-04-21 LAB — PHOSPHORUS: Phosphorus: 3.9 mg/dL (ref 2.5–4.6)

## 2022-04-21 LAB — COMPREHENSIVE METABOLIC PANEL
ALT: 15 U/L (ref 0–44)
AST: 19 U/L (ref 15–41)
Albumin: 3.5 g/dL (ref 3.5–5.0)
Alkaline Phosphatase: 115 U/L (ref 38–126)
Anion gap: 11 (ref 5–15)
BUN: 28 mg/dL — ABNORMAL HIGH (ref 8–23)
CO2: 20 mmol/L — ABNORMAL LOW (ref 22–32)
Calcium: 8.7 mg/dL — ABNORMAL LOW (ref 8.9–10.3)
Chloride: 107 mmol/L (ref 98–111)
Creatinine, Ser: 1.52 mg/dL — ABNORMAL HIGH (ref 0.44–1.00)
GFR, Estimated: 33 mL/min — ABNORMAL LOW (ref >60)
Glucose, Bld: 90 mg/dL (ref 70–99)
Potassium: 4.5 mmol/L (ref 3.5–5.1)
Sodium: 138 mmol/L (ref 135–145)
Total Bilirubin: 2.1 mg/dL — ABNORMAL HIGH (ref 0.3–1.2)
Total Protein: 6.8 g/dL (ref 6.5–8.1)

## 2022-04-21 LAB — BLOOD GAS, VENOUS
Acid-base deficit: 3.3 mmol/L — ABNORMAL HIGH (ref 0.0–2.0)
Bicarbonate: 24 mmol/L (ref 20.0–28.0)
Delivery systems: POSITIVE
FIO2: 40 %
O2 Saturation: 85.9 %
Patient temperature: 37
pCO2, Ven: 51 mmHg (ref 44–60)
pH, Ven: 7.28 (ref 7.25–7.43)
pO2, Ven: 56 mmHg — ABNORMAL HIGH (ref 32–45)

## 2022-04-21 LAB — LACTIC ACID, PLASMA
Lactic Acid, Venous: 0.7 mmol/L (ref 0.5–1.9)
Lactic Acid, Venous: 1 mmol/L (ref 0.5–1.9)

## 2022-04-21 LAB — CBG MONITORING, ED: Glucose-Capillary: 96 mg/dL (ref 70–99)

## 2022-04-21 LAB — PROTIME-INR
INR: 1.2 (ref 0.8–1.2)
Prothrombin Time: 15 seconds (ref 11.4–15.2)

## 2022-04-21 LAB — SARS CORONAVIRUS 2 BY RT PCR: SARS Coronavirus 2 by RT PCR: NEGATIVE

## 2022-04-21 LAB — TROPONIN I (HIGH SENSITIVITY)
Troponin I (High Sensitivity): 14 ng/L (ref <18)
Troponin I (High Sensitivity): 15 ng/L (ref <18)

## 2022-04-21 LAB — STREP PNEUMONIAE URINARY ANTIGEN: Strep Pneumo Urinary Antigen: NEGATIVE

## 2022-04-21 LAB — MAGNESIUM: Magnesium: 2.6 mg/dL — ABNORMAL HIGH (ref 1.7–2.4)

## 2022-04-21 LAB — GLUCOSE, CAPILLARY
Glucose-Capillary: 107 mg/dL — ABNORMAL HIGH (ref 70–99)
Glucose-Capillary: 102 mg/dL — ABNORMAL HIGH (ref 70–99)
Glucose-Capillary: 89 mg/dL (ref 70–99)

## 2022-04-21 LAB — BRAIN NATRIURETIC PEPTIDE: B Natriuretic Peptide: 494.1 pg/mL — ABNORMAL HIGH (ref 0.0–100.0)

## 2022-04-21 LAB — MRSA NEXT GEN BY PCR, NASAL: MRSA by PCR Next Gen: NOT DETECTED

## 2022-04-21 LAB — PROCALCITONIN: Procalcitonin: <0.1 ng/mL

## 2022-04-21 LAB — APTT: aPTT: 33 seconds (ref 24–36)

## 2022-04-21 MED ORDER — ORAL CARE MOUTH RINSE: 15.0000 mL | OROMUCOSAL | Status: DC | PRN

## 2022-04-21 MED ORDER — PANTOPRAZOLE SODIUM 40 MG PO TBEC: 40.0000 mg | DELAYED_RELEASE_TABLET | Freq: Every day | ORAL | Status: DC

## 2022-04-21 MED ORDER — DOCUSATE SODIUM 50 MG/5ML PO LIQD: 100.0000 mg | Freq: Two times a day (BID) | ORAL | Status: DC | PRN

## 2022-04-21 MED ORDER — FUROSEMIDE 10 MG/ML IJ SOLN
40.0000 mg | Freq: Once | INTRAMUSCULAR | Status: AC
Start: 2022-04-21 — End: 2022-04-21
  Administered 2022-04-21: 40 mg via INTRAVENOUS
  Filled 2022-04-21: qty 4

## 2022-04-21 MED ORDER — IPRATROPIUM-ALBUTEROL 0.5-2.5 (3) MG/3ML IN SOLN
3.0000 mL | Freq: Once | RESPIRATORY_TRACT | Status: AC
  Administered 2022-04-21: 3 mL via RESPIRATORY_TRACT
  Filled 2022-04-21: qty 3

## 2022-04-21 MED ORDER — HEPARIN SODIUM (PORCINE) 5000 UNIT/ML IJ SOLN
5000.0000 [IU] | Freq: Three times a day (TID) | INTRAMUSCULAR | Status: DC
  Administered 2022-04-21: 5000 [IU] via SUBCUTANEOUS
  Filled 2022-04-21: qty 1

## 2022-04-21 MED ORDER — PANTOPRAZOLE 2 MG/ML SUSPENSION
40.0000 mg | Freq: Every day | ORAL | Status: DC
  Administered 2022-04-21: 40 mg
  Filled 2022-04-21 (×2): qty 20

## 2022-04-21 MED ORDER — PROPOFOL 1000 MG/100ML IV EMUL
0.0000 ug/kg/min | INTRAVENOUS | Status: DC
  Administered 2022-04-21: 5 ug/kg/min via INTRAVENOUS
  Administered 2022-04-21: 20 ug/kg/min via INTRAVENOUS
  Filled 2022-04-21 (×3): qty 100

## 2022-04-21 MED ORDER — ETOMIDATE 2 MG/ML IV SOLN
20.0000 mg | Freq: Once | INTRAVENOUS | Status: AC
  Administered 2022-04-21: 20 mg via INTRAVENOUS
  Filled 2022-04-21: qty 10

## 2022-04-21 MED ORDER — DEXTROSE 50 % IV SOLN
1.0000 | Freq: Once | INTRAVENOUS | Status: AC
  Administered 2022-04-21: 50 mL via INTRAVENOUS
  Filled 2022-04-21: qty 50

## 2022-04-21 MED ORDER — ORAL CARE MOUTH RINSE
15.0000 mL | OROMUCOSAL | Status: DC
  Administered 2022-04-21 – 2022-04-25 (×43): 15 mL via OROMUCOSAL

## 2022-04-21 MED ORDER — INSULIN ASPART 100 UNIT/ML IJ SOLN
0.0000 [IU] | INTRAMUSCULAR | Status: DC
  Administered 2022-04-22: 1 [IU] via SUBCUTANEOUS
  Administered 2022-04-22: 2 [IU] via SUBCUTANEOUS
  Administered 2022-04-23: 3 [IU] via SUBCUTANEOUS
  Administered 2022-04-23: 2 [IU] via SUBCUTANEOUS
  Administered 2022-04-23 (×2): 1 [IU] via SUBCUTANEOUS
  Administered 2022-04-23: 2 [IU] via SUBCUTANEOUS
  Administered 2022-04-23: 1 [IU] via SUBCUTANEOUS
  Administered 2022-04-24: 3 [IU] via SUBCUTANEOUS
  Filled 2022-04-21 (×9): qty 1

## 2022-04-21 MED ORDER — ATORVASTATIN CALCIUM 20 MG PO TABS
80.0000 mg | ORAL_TABLET | Freq: Every day | ORAL | Status: DC
Start: 2022-04-21 — End: 2022-04-25
  Administered 2022-04-21 – 2022-04-25 (×5): 80 mg
  Filled 2022-04-21 (×5): qty 4

## 2022-04-21 MED ORDER — SUCCINYLCHOLINE CHLORIDE 200 MG/10ML IV SOSY
125.0000 mg | PREFILLED_SYRINGE | Freq: Once | INTRAVENOUS | Status: AC
  Administered 2022-04-21: 125 mg via INTRAVENOUS
  Filled 2022-04-21: qty 10

## 2022-04-21 MED ORDER — APIXABAN 2.5 MG PO TABS
2.5000 mg | ORAL_TABLET | Freq: Two times a day (BID) | ORAL | Status: DC
Start: 2022-04-21 — End: 2022-04-25
  Administered 2022-04-21 – 2022-04-25 (×8): 2.5 mg
  Filled 2022-04-21 (×8): qty 1

## 2022-04-21 MED ORDER — DOCUSATE SODIUM 100 MG PO CAPS: 100.0000 mg | ORAL_CAPSULE | Freq: Two times a day (BID) | ORAL | Status: DC | PRN

## 2022-04-21 MED ORDER — PANTOPRAZOLE 2 MG/ML SUSPENSION
40.0000 mg | Freq: Every day | ORAL | Status: DC
  Administered 2022-04-21 – 2022-04-25 (×5): 40 mg
  Filled 2022-04-21 (×5): qty 20

## 2022-04-21 MED ORDER — FUROSEMIDE 10 MG/ML IJ SOLN
80.0000 mg | Freq: Two times a day (BID) | INTRAMUSCULAR | Status: DC
  Administered 2022-04-21 – 2022-04-22 (×3): 80 mg via INTRAVENOUS
  Filled 2022-04-21 (×3): qty 8

## 2022-04-21 MED ORDER — POLYETHYLENE GLYCOL 3350 17 G PO PACK: 17.0000 g | PACK | Freq: Every day | ORAL | Status: DC | PRN

## 2022-04-21 NOTE — ED Notes (Signed)
Pt being tubed at this time. Unable to pass tube. Pt being bagged.

## 2022-04-21 NOTE — ED Notes (Addendum)
Intubation attempt with smaller stylet. Pt tubed at this time. 7.0 placed, color change positive. OG placed at this time

## 2022-04-21 NOTE — ED Notes (Signed)
20 mg of Etomidate given at this time.

## 2022-04-21 NOTE — ED Notes (Signed)
Pt being bagged

## 2022-04-21 NOTE — ED Notes (Signed)
MD at bedside with RT and RN Claiborne Fermin to intubate pt.

## 2022-04-21 NOTE — ED Notes (Signed)
Pt cleaned up and was saturated in urine and stool. Pt placed in clean brief and chux. Pt placed in clean gown.

## 2022-04-21 NOTE — ED Notes (Signed)
Informed RN bed assigned 

## 2022-04-21 NOTE — ED Notes (Signed)
125 succ given at this time.

## 2022-04-21 NOTE — Progress Notes (Signed)
1750 Admitted from ED via stretcher. Sedated and ventilated.

## 2022-04-21 NOTE — ED Triage Notes (Signed)
Per EMS, family went to see patient today at her home and found her grunt breathing while sitting in her recliner, not responding to family commands. BG 48 with EMS, and then increased to 125 after 250 of D10. Family assumes patient did not go to bed last night and slept in the recliner. Family has not seen patient in a few days. Lives alone at home. Arrives to ED with grunting breathing and not responding to pain

## 2022-04-21 NOTE — Progress Notes (Signed)
NAME:  Dorothy [name], MRN:  [id number], DOB:  [birth year], LOS: 0 ADMISSION DATE:  04/16/2022, CONSULTATION DATE:  04/24/2022 CHIEF COMPLAINT:  Shortness of breath   History of Present Illness:  Dorothy [name] is an 87 year old female with a history of multiple medical problems that include atrial fibrillation (on Eliquis), congestive heart failure, COPD, CKD, diabetes, sleep apnea (not on CPAP), and hypertension who presents to the hospital with altered mental status and shortness of breath.  Presentation to the ER, she was altered and history was limited.  She was placed on BiPAP given her hypoxia though she was unable to tolerate it and following a conversation with her family, decision was made to intubate her and place her on mechanical ventilation.  Collateral was obtained by calling her granddaughter [name].  She reports that she last saw her grandmother on Wednesday.  On the days when she is not there, the patient communicates with her daughters and she did call her daughter on Thursday night.  Today, [name] found Dorothy [name] altered and unresponsive prompting her to call EMS.  Further history obtained indicates that she had "fluid buildup in her legs" around 3 to 4 weeks ago for which she required some Lasix.  [name] reports that the medical providers were hesitant to give her more diuretics given her CKD.  Granddaughter also reports that she had an episode of decompensated heart failure around 4 to 5 years ago.   Pertinent  Medical History  1. atrial fibrillation (on 2.5 mg of Eliquis twice daily) 2.  Congestive heart failure 3.  CKD 4.  COPD 5.  Type 2 diabetes (on insulin) 6.  Sleep apnea (not on CPAP) 7.  Hypertension 8.  Iron deficiency anemia (on IV iron)  Significant Hospital Events: Including procedures, antibiotic start and stop dates in addition to other pertinent events   8/11 presented to the ER, failed BiPAP, intubated  Interim History / Subjective:  Unable to  obtain, collateral obtained by calling her granddaughter [name] Cobb.  Objective   Blood pressure 136/78, pulse 66, temperature (!) 97.5 F (36.4 C), temperature source Axillary, resp. rate 12, height $RemoveBe'5\' 5"'YHFOKFiWj$  (1.651 m), weight 89.4 kg, SpO2 100 %.    Vent Mode: PRVC FiO2 (%):  [45 %] 45 % Set Rate:  [20 bmp] 20 bmp Vt Set:  [450 mL] 450 mL PEEP:  [5 cmH20] 5 cmH20  No intake or output data in the 24 hours ending 05/01/2022 1643 Filed Weights   05/02/2022 1451  Weight: 89.4 kg    Examination: General: Ill-appearing elderly white female.  Not in acute distress. HENT: ET tube in place, pupils equal and reactive, no scleral icterus. Lungs: Bilateral Rales and rhonchi on anterior auscultation. Cardiovascular: Regular rate and rhythm, no murmurs noted. Abdomen: Distended, positive bowel sounds, nontender Extremities: Lower extremity edema to the level of the mid thighs.  Changes of chronic stasis dermatitis over the bilateral shins Neuro: Unable to assess  Resolved Hospital Problem list     Assessment & Plan:   Dorothy [name] is an 87 year old female with a history of multiple medical problems most notable for congestive heart failure, atrial fibrillation, CKD, sleep apnea, and type 2 diabetes who presents to the hospital with altered mental status and acute hypoxic respiratory failure requiring endotracheal intubation and mechanical ventilation.  #Acute hypoxic respiratory failure #Pulmonary edema  Patient presented with acute hypoxia that failed trial of BiPAP.  She was intubated and currently has an 7.0 ET tube  and mechanically ventilated with minimal vent settings and saturating well.  Chest x-ray reviewed by me is notable for bilateral pulmonary edema and bilateral small pleural effusions.  She is currently maintained on PRVC with FiO2 40%, respiratory rate of 18, and tidal volume 450 with a PEEP of 5 and is saturating well.  In terms of management, we will optimize her volume status  by initiation of IV furosemide (see below in decompensated heart failure), continue to support her respiratory status with mechanical ventilation, and rule out infectious etiologies by obtaining respiratory culture, a respiratory viral panel, strep pneumonia urinary antigen, and Legionella urinary antigen. I will also obtain a procalcitonin.  -Full vent support, implement lung protective strategies -Plateau pressures less than 30 cm H20 -Wean FiO2 & PEEP as tolerated to maintain O2 sats >92% -Follow intermittent Chest X-ray & ABG as needed -Spontaneous Breathing Trials when respiratory parameters met and mental status permits -Implement VAP Bundle -Prn Bronchodilators -Maintain a RASS goal of -1 -Propofol to maintain RASS goal -Daily wake up assessment  #Decompensated heart failure with preserved ejection fraction  History of HFpEF and follows with Dr. Darrold Junker over at [facility].  Her last TTE was in 2018 showing an EF of 55 to 60% with moderate mitral regurgitation.  She had multiple admissions for respiratory failure secondary to combination of COPD/HFpEF and is maintained on Eliquis for anticoagulation for her atrial fibrillation.  Imaging is notable for bilateral pulmonary edema and pleural effusions with acute hypoxic respiratory failure.  Her BNP is elevated at 494.  While she does have CKD, it is my impression that she is critically ill and will require aggressive IV diuresis for volume management.  To that effect, I will initiate her on twice daily IV furosemide 60 mg and closely monitor her electrolytes. I will also obtain a repeat TTE in the AM.  - TTE in AM - Furosemide 60 mg IV bid - bid BMP, Mag, Phos  #CKD  Patient is known to have CKD and her creatinine today is at 1.52 which is close to her baseline. Will renally adjust her medications per her creatinine clearance.  #Afib  Resume eliquis 2.5 mg bid, place on telemetry.  #Type 2 Diabetes   Insulin sliding  scale  -CBG's q4h; Target range of 140 to 180 -SSI -Follow ICU Hypo/Hyperglycemia protocol   Best Practice (right click and "Reselect all SmartList Selections" daily)   Diet/type: NPO, consult to dietary placed DVT prophylaxis: DOAC GI prophylaxis: PPI Lines: N/A Foley:  Yes, and it is still needed Code Status:  limited   Labs   CBC: Recent Labs  Lab 04/11/2022 1440  WBC 11.1*  NEUTROABS 10.3*  HGB 12.8  HCT 44.0  MCV 93.6  PLT 200    Basic Metabolic Panel: Recent Labs  Lab 04/28/2022 1440  NA 138  K 4.5  CL 107  CO2 20*  GLUCOSE 90  BUN 28*  CREATININE 1.52*  CALCIUM 8.7*   GFR: Estimated Creatinine Clearance: 29.9 mL/min (A) (by C-G formula based on SCr of 1.52 mg/dL (H)). Recent Labs  Lab 04/23/2022 1440  WBC 11.1*  LATICACIDVEN 0.7    Liver Function Tests: Recent Labs  Lab 05/08/2022 1440  AST 19  ALT 15  ALKPHOS 115  BILITOT 2.1*  PROT 6.8  ALBUMIN 3.5   No results for input(s): "LIPASE", "AMYLASE" in the last 168 hours. No results for input(s): "AMMONIA" in the last 168 hours.  ABG    Component Value Date/Time  HCO3 24.0 05/05/2022 1502   ACIDBASEDEF 3.3 (H) 04/24/2022 1502   O2SAT 85.9 04/22/2022 1502     Coagulation Profile: No results for input(s): "INR", "PROTIME" in the last 168 hours.  Cardiac Enzymes: No results for input(s): "CKTOTAL", "CKMB", "CKMBINDEX", "TROPONINI" in the last 168 hours.  HbA1C: Hemoglobin A1C  Date/Time Value Ref Range Status  01/11/2012 04:19 AM 9.6 (H) 4.2 - 6.3 % Final    Comment:    The American Diabetes Association recommends that a primary goal of therapy should be <7% and that physicians should reevaluate the treatment regimen in patients with HbA1c values consistently >8%.    Hgb A1c MFr Bld  Date/Time Value Ref Range Status  01/16/2020 05:13 AM 6.4 (H) 4.8 - 5.6 % Final    Comment:    (NOTE) Pre diabetes:          5.7%-6.4% Diabetes:              >6.4% Glycemic control for    <7.0% adults with diabetes   10/07/2019 06:29 AM 6.4 (H) 4.8 - 5.6 % Final    Comment:    (NOTE) Pre diabetes:          5.7%-6.4% Diabetes:              >6.4% Glycemic control for   <7.0% adults with diabetes     CBG: Recent Labs  Lab 05/09/2022 1430  GLUCAP 96    Review of Systems:   Unable to obtain  Past Medical History:  She,  has a past medical history of Anemia, Arthritis, Atrial fibrillation ([place]), CHF (congestive heart failure) ([place]), COPD (chronic obstructive pulmonary disease) ([place]), Diabetes mellitus without complication ([place]), Dyspnea, GERD (gastroesophageal reflux disease), Hyperlipidemia, Hypertension, PONV (postoperative nausea and vomiting), Sleep apnea, and Wears dentures.   Surgical History:   Past Surgical History:  Procedure Laterality Date   ABDOMINAL HYSTERECTOMY     CATARACT EXTRACTION W/PHACO Right 07/12/2016   Procedure: CATARACT EXTRACTION PHACO AND INTRAOCULAR LENS PLACEMENT (IOC);  Surgeon: Leandrew Koyanagi, MD;  Location: [place];  Service: Ophthalmology;  Laterality: Right;  DIABETIC - insulin and oral meds sleep apnea   CATARACT EXTRACTION W/PHACO Left 04/09/2019   Procedure: CATARACT EXTRACTION PHACO AND INTRAOCULAR LENS PLACEMENT ([place])  LEFT DIABETIC;  Surgeon: Leandrew Koyanagi, MD;  Location: [place];  Service: Ophthalmology;  Laterality: Left;  Diabetic - insulin and oral meds   CHOLECYSTECTOMY     COLONOSCOPY N/A 01/19/2015   Procedure: COLONOSCOPY;  Surgeon: Josefine Class, MD;  Location[facility];  Service: Endoscopy;  Laterality: N/A;   COLONOSCOPY WITH PROPOFOL N/A 07/11/2017   Procedure: COLONOSCOPY WITH PROPOFOL;  Surgeon: Toledo, Benay Pike, MD;  Location: [facility] ENDOSCOPY;  Service: Gastroenterology;  Laterality: N/A;   DIALYSIS/PERMA CATHETER INSERTION N/A 01/19/2020   Procedure: DIALYSIS/PERMA CATHETER INSERTION;  Surgeon: Algernon Huxley, MD;  Location: [place] CV LAB;  Service:  Cardiovascular;  Laterality: N/A;   DIALYSIS/PERMA CATHETER REMOVAL N/A 03/18/2020   Procedure: DIALYSIS/PERMA CATHETER REMOVAL;  Surgeon: Algernon Huxley, MD;  Location: [place] CV LAB;  Service: Cardiovascular;  Laterality: N/A;   ESOPHAGOGASTRODUODENOSCOPY N/A 01/19/2015   Procedure: ESOPHAGOGASTRODUODENOSCOPY (EGD);  Surgeon: Josefine Class, MD;  Location[facility] ENDOSCOPY;  Service: Endoscopy;  Laterality: N/A;   ESOPHAGOGASTRODUODENOSCOPY (EGD) WITH PROPOFOL N/A 07/11/2017   Procedure: ESOPHAGOGASTRODUODENOSCOPY (EGD) WITH PROPOFOL;  Surgeon: Toledo, Benay Pike, MD;  Location: [facility] ENDOSCOPY;  Service: Gastroenterology;  Laterality: N/A;   SHOULDER ARTHROSCOPY  Social History:   reports that she has never smoked. She has never used smokeless tobacco. She reports that she does not drink alcohol and does not use drugs.   Family History:  Her family history is not on file.   Allergies Allergies  Allergen Reactions   Tramadol Anaphylaxis   Aspirin     "Stomach problems"     Home Medications  Prior to Admission medications   Medication Sig Start Date End Date Taking? Authorizing Provider  albuterol (VENTOLIN HFA) 108 (90 Base) MCG/ACT inhaler Inhale 2 puffs into the lungs every 6 (six) hours as needed for wheezing or shortness of breath. 10/11/19  Yes Sreenath, Sudheer B, MD  amLODipine (NORVASC) 10 MG tablet Take 10 mg by mouth daily. 10/26/20  Yes [provider]  apixaban (ELIQUIS) 2.5 MG TABS tablet Take 2.5 mg by mouth 2 (two) times daily.    Yes [provider]  atorvastatin (LIPITOR) 80 MG tablet Take 80 mg by mouth daily.   Yes [provider]  Calcium Carb-Cholecalciferol (CALCIUM 600 + D PO) Take 1 tablet by mouth in the morning and at bedtime.    Yes [provider]  cloNIDine (CATAPRES) 0.1 MG tablet Take 0.1 mg by mouth 2 (two) times daily.   Yes [provider]  colestipol (COLESTID) 1 g tablet Take 1 g by mouth 2 (two)  times daily.   Yes [provider]  empagliflozin (JARDIANCE) 10 MG TABS tablet  02/16/20  Yes [provider]  esomeprazole (NEXIUM) 20 MG capsule Take 20 mg by mouth at bedtime.   Yes [provider]  ferrous sulfate 325 (65 FE) MG tablet Take 325 mg by mouth daily with breakfast.   Yes [provider]  insulin NPH Human (NOVOLIN N) 100 UNIT/ML injection Inject 28 Units into the skin.   Yes [provider]  Ipratropium-Albuterol (COMBIVENT RESPIMAT) 20-100 MCG/ACT AERS respimat Inhale 1 puff into the lungs 3 (three) times daily.   Yes [provider]  lisinopril (ZESTRIL) 10 MG tablet Take 10 mg by mouth daily.   Yes [provider]  mirtazapine (REMERON) 15 MG tablet Take 15 mg by mouth at bedtime. 10/26/20  Yes [provider]  multivitamin (RENA-VIT) TABS tablet Take 1 tablet by mouth at bedtime. 01/23/20  Yes Wieting, Richard, MD  potassium chloride (KLOR-CON) 10 MEQ tablet Take 10 mEq by mouth daily.   Yes [provider]  sodium chloride (OCEAN) 0.65 % SOLN nasal spray Place 1 spray into both nostrils as needed for congestion. 01/23/20  Yes Wieting, Richard, MD  vitamin B-12 (CYANOCOBALAMIN) 100 MCG tablet Take 100 mcg by mouth daily.   Yes [provider]  acetaminophen (TYLENOL) 500 MG tablet Take 1,000 mg by mouth every 6 (six) hours as needed for moderate pain or headache.    [provider]  ipratropium-albuterol (DUONEB) 0.5-2.5 (3) MG/3ML SOLN Take 3 mLs by nebulization 3 (three) times daily. 10/11/19   Sidney Ace, MD     Critical care time: 45 min

## 2022-04-21 NOTE — ED Provider Notes (Signed)
[facility] Provider Note    None    (approximate)   History   Respiratory Distress   HPI  Dorothy [name] is a 87 y.o. female  here with SOB, resp distress. History limited by confusion, resp status. Per report, pt was last normal yesterday. Pt lives alone. She was found this morning by family in her chair, altered, breathing loudly. EMS called. On EMS arrival, pt altered, hypoxic, hypoglycemic. She is supposed to be on home O2 but did not have this on. No known last known normal officially. Remainder of history limited 2/2 AMS.  Level 5 caveat invoked as remainder of history, ROS, and physical exam limited due to patient's AMS.   Physical Exam   Triage Vital Signs: ED Triage Vitals  Enc Vitals Group     BP      Pulse      Resp      Temp      Temp src      SpO2      Weight      Height      Head Circumference      Peak Flow      Pain Score      Pain Loc      Pain Edu?      Excl. in [place]?     Most recent vital signs: Vitals:   05/06/2022 1545 04/20/2022 1550  BP: 136/78   Pulse: 66   Resp:    Temp:    SpO2: 100% 100%     General: Minimally responsive but withdraws to painful stimuli, elderly. CV:  Good peripheral perfusion. RRR. No murmurs. Resp:  Tachypneic, markedly diminished aeration bilaterally, rales. Transmitted upper airway sounds noted. Abd:  No distention.  Other:  Elderly, ill-appearing, tachypneic. Does not orient to examiner but does withdraw to noxious stimuli.   ED Results / Procedures / Treatments   Labs (all labs ordered are listed, but only abnormal results are displayed) Labs Reviewed  CBC WITH DIFFERENTIAL/PLATELET - Abnormal; Notable for the following components:      Result Value   WBC 11.1 (*)    MCHC 29.1 (*)    Neutro Abs 10.3 (*)    Lymphs Abs 0.3 (*)    All other components within normal limits  COMPREHENSIVE METABOLIC PANEL - Abnormal; Notable for the following components:   CO2 20 (*)    BUN 28 (*)     Creatinine, Ser 1.52 (*)    Calcium 8.7 (*)    Total Bilirubin 2.1 (*)    GFR, Estimated 33 (*)    All other components within normal limits  BRAIN NATRIURETIC PEPTIDE - Abnormal; Notable for the following components:   B Natriuretic Peptide 494.1 (*)    All other components within normal limits  BLOOD GAS, VENOUS - Abnormal; Notable for the following components:   pO2, Ven 56 (*)    Acid-base deficit 3.3 (*)    All other components within normal limits  SARS CORONAVIRUS 2 BY RT PCR  CULTURE, BLOOD (ROUTINE X 2)  CULTURE, BLOOD (ROUTINE X 2)  LACTIC ACID, PLASMA  LACTIC ACID, PLASMA  CBG MONITORING, ED  CBG MONITORING, ED  TROPONIN I (HIGH SENSITIVITY)  TROPONIN I (HIGH SENSITIVITY)     EKG Suspected AFib, VR 81. PR 53, QRS 115, QTc 392. Significant baseline artifact limiting evaluation.   RADIOLOGY CXR: Findings c/w CHF   I also independently reviewed and agree with radiologist interpretations.   PROCEDURES:  Critical Care performed: Yes, see critical care procedure note(s)  .Critical Care  Performed by: Duffy Bruce, MD Authorized by: Duffy Bruce, MD   Critical care provider statement:    Critical care time (minutes):  30   Critical care time was exclusive of:  Separately billable procedures and treating other patients   Critical care was necessary to treat or prevent imminent or life-threatening deterioration of the following conditions:  Cardiac failure, circulatory failure and respiratory failure   Critical care was time spent personally by me on the following activities:  Development of treatment plan with patient or surrogate, discussions with consultants, evaluation of patient's response to treatment, examination of patient, ordering and review of laboratory studies, ordering and review of radiographic studies, ordering and performing treatments and interventions, pulse oximetry, re-evaluation of patient's condition and review of old charts Procedure  Name: Intubation Date/Time: 05/10/2022 4:03 PM  Performed by: Duffy Bruce, MDPre-anesthesia Checklist: Patient identified, Patient being monitored, Emergency Drugs available, Timeout performed and Suction available Oxygen Delivery Method: Non-rebreather mask Preoxygenation: Pre-oxygenation with 100% oxygen Induction Type: Rapid sequence Ventilation: Mask ventilation without difficulty Laryngoscope Size: Glidescope and 4 Grade View: Grade I Tube size: 7.0 mm Number of attempts: 1 Airway Equipment and Method: Video-laryngoscopy and Rigid stylet Placement Confirmation: ETT inserted through vocal cords under direct vision, CO2 detector and Breath sounds checked- equal and bilateral Secured at: 22 cm Tube secured with: ETT holder Dental Injury: Teeth and Oropharynx as per pre-operative assessment  Difficulty Due To: Difficulty was unanticipated Future Recommendations: Recommend- induction with short-acting agent, and alternative techniques readily available Comments: OF NOTE, PT HAD NARROW SUBGLOTTIC TRACHEA WITH DIFFICULTY PASSING 7.5 TUBE. 7.0 TUBE USED W/O SIGNIFICANT DIFFICULTY.        MEDICATIONS ORDERED IN ED: Medications  furosemide (LASIX) injection 40 mg (has no administration in time range)  propofol (DIPRIVAN) 1000 MG/100ML infusion (has no administration in time range)  ipratropium-albuterol (DUONEB) 0.5-2.5 (3) MG/3ML nebulizer solution 3 mL (3 mLs Nebulization Given 04/14/2022 1456)  ipratropium-albuterol (DUONEB) 0.5-2.5 (3) MG/3ML nebulizer solution 3 mL (3 mLs Nebulization Given 05/02/2022 1456)  ipratropium-albuterol (DUONEB) 0.5-2.5 (3) MG/3ML nebulizer solution 3 mL (3 mLs Nebulization Given 04/20/2022 1456)  dextrose 50 % solution 50 mL (50 mLs Intravenous Given 04/24/2022 1457)  etomidate (AMIDATE) injection 20 mg (20 mg Intravenous Given 04/19/2022 1546)  succinylcholine (ANECTINE) syringe 125 mg (125 mg Intravenous Given 04/25/2022 1547)     IMPRESSION / MDM / ASSESSMENT  AND PLAN / ED COURSE  I reviewed the triage vital signs and the nursing notes.                               The patient is on the cardiac monitor to evaluate for evidence of arrhythmia and/or significant heart rate changes.   Ddx:  Differential includes the following, with pertinent life- or limb-threatening emergencies considered:  Acute CHF, PNA, CVA, seizure, hypcapnea, hypoxia, sepsis, anemia  Patient's presentation is most consistent with acute presentation with potential threat to life or bodily function.  MDM:  87 yo F with PMHx CHF, anemia, here with SOB, AMS. Pt arrives altered, hypoxic, grunting. Unclear etiology, suspect CHF excaerbation, hypoxia, now complicated by acute encephalopathy. Pt initially placed on BIPAP, given D50 for recurrent hypoglycemia. Family brought to bedside. Pt remained grunting, altered despite intervention above. Labs show mild acidemia but o/w no apparent source of her AMS other than likely CHF/encephalopathy from hypoxia. Based on shared  decision making with family, decision made to intubate for airway protection, ability to obtain CT for eval of CVA. No compressions, but short term intubation OK per family. Spoke with both daughters and granddaughter via phone.  Plan to admit to ICU for resp failure, AMS likely 2/2 hypoxia/CHF exacerbation. Will need f/u CXR and CT Head. Dr. Quentin Cornwall aware.   MEDICATIONS GIVEN IN ED: Medications  furosemide (LASIX) injection 40 mg (has no administration in time range)  propofol (DIPRIVAN) 1000 MG/100ML infusion (has no administration in time range)  ipratropium-albuterol (DUONEB) 0.5-2.5 (3) MG/3ML nebulizer solution 3 mL (3 mLs Nebulization Given 05/02/2022 1456)  ipratropium-albuterol (DUONEB) 0.5-2.5 (3) MG/3ML nebulizer solution 3 mL (3 mLs Nebulization Given 04/18/2022 1456)  ipratropium-albuterol (DUONEB) 0.5-2.5 (3) MG/3ML nebulizer solution 3 mL (3 mLs Nebulization Given 04/23/2022 1456)  dextrose 50 % solution 50  mL (50 mLs Intravenous Given 04/20/2022 1457)  etomidate (AMIDATE) injection 20 mg (20 mg Intravenous Given 04/25/2022 1546)  succinylcholine (ANECTINE) syringe 125 mg (125 mg Intravenous Given 04/12/2022 1547)     Consults:  Intensivist paged   EMR reviewed       FINAL CLINICAL IMPRESSION(S) / ED DIAGNOSES   Final diagnoses:  Acute encephalopathy  Acute on chronic systolic congestive heart failure (HCC)  Acute respiratory failure with hypoxia ([place])     Rx / DC Orders   ED Discharge Orders     None        Note:  This document was prepared using Dragon voice recognition software and may include unintentional dictation errors.   Duffy Bruce, MD 05/06/2022 [phone]

## 2022-04-21 NOTE — Progress Notes (Signed)
VAST consult received to obtain IV access for possible pressors. Patient's bilateral lower arms are edematous and weeping. Best practice would indicate a central line for this patient; ICU RN aware. VAST RN able to place a 2.5 inch 22G IV in patient's right upper arm where currently there is no weeping/swelling.

## 2022-04-22 ENCOUNTER — Inpatient Hospital Stay
Admit: 2022-04-22 | Discharge: 2022-04-22 | Disposition: A | Discharge status: Home / Self Care | Payer: Medicare Other | Attending: Student in an Organized Health Care Education/Training Program | Admitting: Student in an Organized Health Care Education/Training Program

## 2022-04-22 ENCOUNTER — Inpatient Hospital Stay: Payer: Medicare Other

## 2022-04-22 DIAGNOSIS — I5023 Acute on chronic systolic (congestive) heart failure: Secondary | ICD-10-CM

## 2022-04-22 LAB — BASIC METABOLIC PANEL
Anion gap: 10 (ref 5–15)
Anion gap: 13 (ref 5–15)
BUN: 29 mg/dL — ABNORMAL HIGH (ref 8–23)
BUN: 29 mg/dL — ABNORMAL HIGH (ref 8–23)
CO2: 24 mmol/L (ref 22–32)
CO2: 22 mmol/L (ref 22–32)
Calcium: 8.4 mg/dL — ABNORMAL LOW (ref 8.9–10.3)
Calcium: 8.2 mg/dL — ABNORMAL LOW (ref 8.9–10.3)
Chloride: 105 mmol/L (ref 98–111)
Chloride: 105 mmol/L (ref 98–111)
Creatinine, Ser: 1.63 mg/dL — ABNORMAL HIGH (ref 0.44–1.00)
Creatinine, Ser: 1.69 mg/dL — ABNORMAL HIGH (ref 0.44–1.00)
GFR, Estimated: 31 mL/min — ABNORMAL LOW (ref >60)
GFR, Estimated: 29 mL/min — ABNORMAL LOW (ref >60)
Glucose, Bld: 95 mg/dL (ref 70–99)
Glucose, Bld: 108 mg/dL — ABNORMAL HIGH (ref 70–99)
Potassium: 3.9 mmol/L (ref 3.5–5.1)
Potassium: 3.7 mmol/L (ref 3.5–5.1)
Sodium: 139 mmol/L (ref 135–145)
Sodium: 140 mmol/L (ref 135–145)

## 2022-04-22 LAB — ECHOCARDIOGRAM COMPLETE
Area-P 1/2: 2.25 cm2
Height: 65 in
S' Lateral: 2.12 cm
Weight: 3152 oz

## 2022-04-22 LAB — HEPATIC FUNCTION PANEL
ALT: 14 U/L (ref 0–44)
AST: 21 U/L (ref 15–41)
Albumin: 2.7 g/dL — ABNORMAL LOW (ref 3.5–5.0)
Alkaline Phosphatase: 83 U/L (ref 38–126)
Bilirubin, Direct: 0.6 mg/dL — ABNORMAL HIGH (ref 0.0–0.2)
Indirect Bilirubin: 1.3 mg/dL — ABNORMAL HIGH (ref 0.3–0.9)
Total Bilirubin: 1.9 mg/dL — ABNORMAL HIGH (ref 0.3–1.2)
Total Protein: 5.4 g/dL — ABNORMAL LOW (ref 6.5–8.1)

## 2022-04-22 LAB — RESPIRATORY PANEL BY PCR

## 2022-04-22 LAB — CBC
HCT: 36.5 % (ref 36.0–46.0)
Hemoglobin: 11.2 g/dL — ABNORMAL LOW (ref 12.0–15.0)
MCH: 27.1 pg (ref 26.0–34.0)
MCHC: 30.7 g/dL (ref 30.0–36.0)
MCV: 88.4 fL (ref 80.0–100.0)
Platelets: 184 10*3/uL (ref 150–400)
RBC: 4.13 MIL/uL (ref 3.87–5.11)
RDW: 14.9 % (ref 11.5–15.5)
WBC: 10.7 10*3/uL — ABNORMAL HIGH (ref 4.0–10.5)
nRBC: 0 % (ref 0.0–0.2)

## 2022-04-22 LAB — PHOSPHORUS
Phosphorus: 3.1 mg/dL (ref 2.5–4.6)
Phosphorus: 3.2 mg/dL (ref 2.5–4.6)
Phosphorus: 3.2 mg/dL (ref 2.5–4.6)

## 2022-04-22 LAB — GLUCOSE, CAPILLARY
Glucose-Capillary: 84 mg/dL (ref 70–99)
Glucose-Capillary: 96 mg/dL (ref 70–99)
Glucose-Capillary: 93 mg/dL (ref 70–99)
Glucose-Capillary: 122 mg/dL — ABNORMAL HIGH (ref 70–99)
Glucose-Capillary: 171 mg/dL — ABNORMAL HIGH (ref 70–99)
Glucose-Capillary: 235 mg/dL — ABNORMAL HIGH (ref 70–99)

## 2022-04-22 LAB — TRIGLYCERIDES: Triglycerides: 33 mg/dL (ref <150)

## 2022-04-22 LAB — MAGNESIUM
Magnesium: 2.1 mg/dL (ref 1.7–2.4)
Magnesium: 2 mg/dL (ref 1.7–2.4)
Magnesium: 2 mg/dL (ref 1.7–2.4)

## 2022-04-22 LAB — PROCALCITONIN: Procalcitonin: 0.19 ng/mL

## 2022-04-22 MED ORDER — VITAL AF 1.2 CAL PO LIQD
1000.0000 mL | ORAL | Status: DC
  Administered 2022-04-22 – 2022-04-24 (×3): 1000 mL

## 2022-04-22 MED ORDER — CHLORHEXIDINE GLUCONATE CLOTH 2 % EX PADS
6.0000 | MEDICATED_PAD | Freq: Every day | CUTANEOUS | Status: DC
Start: 2022-04-22 — End: 2022-04-25
  Administered 2022-04-22 – 2022-04-24 (×4): 6 via TOPICAL

## 2022-04-22 MED ORDER — POTASSIUM CHLORIDE 20 MEQ PO PACK
40.0000 meq | PACK | Freq: Once | ORAL | Status: AC
  Administered 2022-04-22: 40 meq
  Filled 2022-04-22: qty 2

## 2022-04-22 MED ORDER — ACETAMINOPHEN 325 MG PO TABS
650.0000 mg | ORAL_TABLET | ORAL | Status: DC | PRN
Start: 2022-04-22 — End: 2022-04-25

## 2022-04-22 NOTE — Progress Notes (Signed)
Pharmacy Electrolyte Monitoring Consult:  Pharmacy consulted to assist in monitoring and replacing electrolytes in this 87 y.o. female admitted on 04/12/2022 with Respiratory Distress   Labs:  Sodium (mmol/L)  Date Value  04/22/2022 140  01/09/2015 141   Potassium (mmol/L)  Date Value  04/22/2022 3.7  01/09/2015 4.3   Magnesium (mg/dL)  Date Value  04/22/2022 2.0  01/11/2012 1.6 (L)   Phosphorus (mg/dL)  Date Value  04/22/2022 3.2   Calcium (mg/dL)  Date Value  04/22/2022 8.2 (L)   Calcium, Total (mg/dL)  Date Value  01/09/2015 9.2   Albumin (g/dL)  Date Value  04/22/2022 2.7 (L)  12/31/2014 3.9    Assessment/Plan: K 3.9 > 3.7 Furosemide 80 mg IV BID ordered Will order KCL 40 meq x 1 dose F/u with electrolyte labs orderd BID  Zuzanna Maroney A 04/22/2022 4:40 PM

## 2022-04-22 NOTE — Progress Notes (Signed)
*  PRELIMINARY RESULTS* Echocardiogram 2D Echocardiogram has been performed.  Dorothy [name] 04/22/2022, 11:27 AM

## 2022-04-22 NOTE — Progress Notes (Signed)
NAME:  Dorothy [name], MRN:  [id number], DOB:  1935/01/07, LOS: 1 ADMISSION DATE:  04/25/2022, CONSULTATION DATE:  04/28/2022 CHIEF COMPLAINT:  Shortness of breath  History of Present Illness:  Dorothy [name] is an 87 year old female with a history of multiple medical problems that include atrial fibrillation (on Eliquis), congestive heart failure, COPD, CKD, diabetes, sleep apnea (not on CPAP), and hypertension who presents to the hospital with altered mental status and shortness of breath.  Presentation to the ER, she was altered and history was limited.  She was placed on BiPAP given her hypoxia though she was unable to tolerate it and following a conversation with her family, decision was made to intubate her and place her on mechanical ventilation.  Collateral was obtained by calling her granddaughter Dorothy [name].  She reports that she last saw her grandmother on Wednesday.  On the days when she is not there, the patient communicates with her daughters and she did call her daughter on Thursday night.  Today, Dorothy [name] found Dorothy [name] altered and unresponsive prompting her to call EMS.  Further history obtained indicates that she had "fluid buildup in her legs" around 3 to 4 weeks ago for which she required some Lasix.  Dorothy [name] reports that the medical providers were hesitant to give her more diuretics given her CKD.  Granddaughter also reports that she had an episode of decompensated heart failure around 4 to 5 years ago.   Pertinent  Medical History  1. atrial fibrillation (on 2.5 mg of Eliquis twice daily) 2.  Congestive heart failure 3.  CKD 4.  COPD 5.  Type 2 diabetes (on insulin) 6.  Sleep apnea (not on CPAP) 7.  Hypertension 8.  Iron deficiency anemia (on IV iron)  Significant Hospital Events: Including procedures, antibiotic start and stop dates in addition to other pertinent events   8/11 presented to the ER, failed BiPAP, intubated 8/12 remains intubated, responding to IV  furosemide bid with adequate urine output. Low grade fever this AM.  Interim History / Subjective:  Unable to obtain, this morning she is sedated, mechanically ventilated, and minimally responsive.  Objective   Blood pressure 127/60, pulse 73, temperature 100 F (37.8 C), resp. rate 18, height $RemoveBe'5\' 5"'qGfkEJgTh$  (1.651 m), weight 89.4 kg, SpO2 96 %.    Vent Mode: PRVC FiO2 (%):  [40 %-45 %] 40 % Set Rate:  [18 bmp-24 bmp] 18 bmp Vt Set:  [450 mL] 450 mL PEEP:  [5 cmH20] 5 cmH20 Plateau Pressure:  [21 cmH20-31 cmH20] 21 cmH20   Intake/Output Summary (Last 24 hours) at 04/22/2022 0803 Last data filed at 04/25/2022 2245 Gross per 24 hour  Intake 120 ml  Output 1351 ml  Net -1231 ml   Filed Weights   05/03/2022 1451  Weight: 89.4 kg    Examination: General: Ill-appearing elderly white female.  Not in acute distress. HENT: ET tube in place, pupils equal and reactive, no scleral icterus. Lungs: Bilateral Rales and rhonchi on anterior auscultation. Cardiovascular: Regular rate and rhythm, no murmurs noted. Abdomen: Distended, positive bowel sounds, nontender Extremities: Lower extremity edema to the level of the mid thighs.  Changes of chronic stasis dermatitis over the bilateral shins Neuro: Unable to assess  Resolved Hospital Problem list     Assessment & Plan:   Dorothy [name] is an 87 year old female with a history of multiple medical problems most notable for congestive heart failure, atrial fibrillation, CKD, sleep apnea, and type 2 diabetes who presents to the  hospital with altered mental status and acute hypoxic respiratory failure requiring endotracheal intubation and mechanical ventilation.  #Acute hypoxic respiratory failure #Pulmonary edema  Patient presented with acute hypoxia that failed trial of BiPAP.  She was intubated and currently has an 7.0 ET tube and mechanically ventilated with minimal vent settings and saturating well.  Chest x-ray reviewed by me is notable for  bilateral pulmonary edema and bilateral small pleural effusions. She is currently maintained on PRVC with FiO2 40%, respiratory rate of 18, and tidal volume 450 with a PEEP of 5 and is saturating well.  In terms of management, we will optimize her volume status by initiation of IV furosemide (see below in decompensated heart failure), continue to support her respiratory status with mechanical ventilation, and rule out infectious etiologies. Blood cultures were drawn and are pending. Procalcitonin level was 0.1 on admission, and respiratory viral panel was negative by PCR. Urinary S. Pneumo antigen was negative while legionella urinary antigen is pending.   -Full vent support, implement lung protective strategies -Plateau pressures less than 30 cm H20 -Wean FiO2 & PEEP as tolerated to maintain O2 sats >92% -Follow intermittent Chest X-ray & ABG as needed -Spontaneous Breathing Trials when respiratory parameters met and mental status permits -Implement VAP Bundle -Prn Bronchodilators -Maintain a RASS goal of -1 -Propofol to maintain RASS goal -Daily wake up assessment  #Decompensated heart failure with preserved ejection fraction  History of HFpEF and follows with Dr. Saralyn Pilar over at [facility].  Her last TTE was in 2018 showing an EF of 55 to 60% with moderate mitral regurgitation.  She had multiple admissions for respiratory failure secondary to combination of COPD/HFpEF and is maintained on Eliquis for anticoagulation for her atrial fibrillation.  Imaging is notable for bilateral pulmonary edema and pleural effusions with acute hypoxic respiratory failure.  Her BNP on admission was elevated at 494.  While she does have CKD, it is my impression that she is critically ill and will require aggressive IV diuresis for volume management.  To that effect, I will initiate her on twice daily IV furosemide 80 mg and closely monitor her electrolytes. I will also obtain a repeat TTE in the  AM.  - TTE in AM - Furosemide 80 mg IV bid - bid BMP, Mag, Phos  #CKD  Patient is known to have CKD and her creatinine today is at 1.63 which is close to her baseline. Will renally adjust her medications per her creatinine clearance.  #Afib  Resume eliquis 2.5 mg bid, place on telemetry.  #Type 2 Diabetes   Insulin sliding scale  -CBG's q4h; Target range of 140 to 180 -SSI -Follow ICU Hypo/Hyperglycemia protocol   Best Practice (right click and "Reselect all SmartList Selections" daily)   Diet/type: NPO, consult to dietary placed DVT prophylaxis: DOAC GI prophylaxis: PPI Lines: N/A Foley:  Yes, and it is still needed Code Status:  limited   Labs   CBC: Recent Labs  Lab 05/01/2022 1440 04/22/22 0453  WBC 11.1* 10.7*  NEUTROABS 10.3*  --   HGB 12.8 11.2*  HCT 44.0 36.5  MCV 93.6 88.4  PLT 200 184     Basic Metabolic Panel: Recent Labs  Lab 04/14/2022 1440 04/29/2022 1814 04/22/22 0453  NA 138  --  139  K 4.5  --  3.9  CL 107  --  105  CO2 20*  --  24  GLUCOSE 90  --  95  BUN 28*  --  29*  CREATININE 1.52*  --  1.63*  CALCIUM 8.7*  --  8.4*  MG  --  2.6* 2.1  PHOS  --  3.9 3.1    GFR: Estimated Creatinine Clearance: 27.9 mL/min (A) (by C-G formula based on SCr of 1.63 mg/dL (H)). Recent Labs  Lab 04/19/2022 1440 04/15/2022 1622 04/25/2022 1814 04/22/22 0453  PROCALCITON  --   --  <0.10 0.19  WBC 11.1*  --   --  10.7*  LATICACIDVEN 0.7 1.0  --   --      Liver Function Tests: Recent Labs  Lab 04/17/2022 1440 04/22/22 0453  AST 19 21  ALT 15 14  ALKPHOS 115 83  BILITOT 2.1* 1.9*  PROT 6.8 5.4*  ALBUMIN 3.5 2.7*    No results for input(s): "LIPASE", "AMYLASE" in the last 168 hours. No results for input(s): "AMMONIA" in the last 168 hours.  ABG    Component Value Date/Time   PHART 7.39 04/25/2022 0830   PCO2ART 40 04/14/2022 0830   PO2ART 51 (L) 04/11/2022 0830   HCO3 24.0 05/09/2022 1502   ACIDBASEDEF 3.3 (H) 05/03/2022 1502   O2SAT  85.9 04/27/2022 1502     Coagulation Profile: Recent Labs  Lab 04/15/2022 1814  INR 1.2    Cardiac Enzymes: No results for input(s): "CKTOTAL", "CKMB", "CKMBINDEX", "TROPONINI" in the last 168 hours.  HbA1C: Hemoglobin A1C  Date/Time Value Ref Range Status  01/11/2012 04:19 AM 9.6 (H) 4.2 - 6.3 % Final    Comment:    The American Diabetes Association recommends that a primary goal of therapy should be <7% and that physicians should reevaluate the treatment regimen in patients with HbA1c values consistently >8%.    Hgb A1c MFr Bld  Date/Time Value Ref Range Status  01/16/2020 05:13 AM 6.4 (H) 4.8 - 5.6 % Final    Comment:    (NOTE) Pre diabetes:          5.7%-6.4% Diabetes:              >6.4% Glycemic control for   <7.0% adults with diabetes   10/07/2019 06:29 AM 6.4 (H) 4.8 - 5.6 % Final    Comment:    (NOTE) Pre diabetes:          5.7%-6.4% Diabetes:              >6.4% Glycemic control for   <7.0% adults with diabetes     CBG: Recent Labs  Lab 04/18/2022 1744 04/16/2022 1924 04/20/2022 2328 04/22/22 0332 04/22/22 0729  GLUCAP 107* 102* 89 84 96     Review of Systems:   Unable to obtain  Past Medical History:  She,  has a past medical history of Anemia, Arthritis, Atrial fibrillation ([place]), CHF (congestive heart failure) ([place]), COPD (chronic obstructive pulmonary disease) ([place]), Diabetes mellitus without complication ([place]), Dyspnea, GERD (gastroesophageal reflux disease), Hyperlipidemia, Hypertension, PONV (postoperative nausea and vomiting), Sleep apnea, and Wears dentures.   Surgical History:   Past Surgical History:  Procedure Laterality Date   ABDOMINAL HYSTERECTOMY     CATARACT EXTRACTION W/PHACO Right 07/12/2016   Procedure: CATARACT EXTRACTION PHACO AND INTRAOCULAR LENS PLACEMENT (IOC);  Surgeon: Leandrew Koyanagi, MD;  Location: [place];  Service: Ophthalmology;  Laterality: Right;  DIABETIC - insulin and oral meds sleep apnea    CATARACT EXTRACTION W/PHACO Left 04/09/2019   Procedure: CATARACT EXTRACTION PHACO AND INTRAOCULAR LENS PLACEMENT ([place])  LEFT DIABETIC;  Surgeon: Leandrew Koyanagi, MD;  Location: [place];  Service: Ophthalmology;  Laterality: Left;  Diabetic -  insulin and oral meds   CHOLECYSTECTOMY     COLONOSCOPY N/A 01/19/2015   Procedure: COLONOSCOPY;  Surgeon: Josefine Class, MD;  Location: [facility] ENDOSCOPY;  Service: Endoscopy;  Laterality: N/A;   COLONOSCOPY WITH PROPOFOL N/A 07/11/2017   Procedure: COLONOSCOPY WITH PROPOFOL;  Surgeon: [place], Benay Pike, MD;  Location: [facility] ENDOSCOPY;  Service: Gastroenterology;  Laterality: N/A;   DIALYSIS/PERMA CATHETER INSERTION N/A 01/19/2020   Procedure: DIALYSIS/PERMA CATHETER INSERTION;  Surgeon: Algernon Huxley, MD;  Location: [place] CV LAB;  Service: Cardiovascular;  Laterality: N/A;   DIALYSIS/PERMA CATHETER REMOVAL N/A 03/18/2020   Procedure: DIALYSIS/PERMA CATHETER REMOVAL;  Surgeon: Algernon Huxley, MD;  Location: [place] CV LAB;  Service: Cardiovascular;  Laterality: N/A;   ESOPHAGOGASTRODUODENOSCOPY N/A 01/19/2015   Procedure: ESOPHAGOGASTRODUODENOSCOPY (EGD);  Surgeon: Josefine Class, MD;  Location[facility] ENDOSCOPY;  Service: Endoscopy;  Laterality: N/A;   ESOPHAGOGASTRODUODENOSCOPY (EGD) WITH PROPOFOL N/A 07/11/2017   Procedure: ESOPHAGOGASTRODUODENOSCOPY (EGD) WITH PROPOFOL;  Surgeon: [place], Benay Pike, MD;  Location: [facility] ENDOSCOPY;  Service: Gastroenterology;  Laterality: N/A;   SHOULDER ARTHROSCOPY       Social History:   reports that she has never smoked. She has never used smokeless tobacco. She reports that she does not drink alcohol and does not use drugs.   Family History:  Her family history is not on file.   Allergies Allergies  Allergen Reactions   Tramadol Anaphylaxis   Aspirin     "Stomach problems"     Home Medications  Prior to Admission medications   Medication Sig Start Date End Date Taking?  Authorizing Provider  albuterol (VENTOLIN HFA) 108 (90 Base) MCG/ACT inhaler Inhale 2 puffs into the lungs every 6 (six) hours as needed for wheezing or shortness of breath. 10/11/19  Yes Sreenath, Sudheer B, MD  amLODipine (NORVASC) 10 MG tablet Take 10 mg by mouth daily. 10/26/20  Yes [provider]  apixaban (ELIQUIS) 2.5 MG TABS tablet Take 2.5 mg by mouth 2 (two) times daily.    Yes [provider]  atorvastatin (LIPITOR) 80 MG tablet Take 80 mg by mouth daily.   Yes [provider]  Calcium Carb-Cholecalciferol (CALCIUM 600 + D PO) Take 1 tablet by mouth in the morning and at bedtime.    Yes [provider]  cloNIDine (CATAPRES) 0.1 MG tablet Take 0.1 mg by mouth 2 (two) times daily.   Yes [provider]  colestipol (COLESTID) 1 g tablet Take 1 g by mouth 2 (two) times daily.   Yes [provider]  empagliflozin (JARDIANCE) 10 MG TABS tablet  02/16/20  Yes [provider]  esomeprazole (NEXIUM) 20 MG capsule Take 20 mg by mouth at bedtime.   Yes [provider]  ferrous sulfate 325 (65 FE) MG tablet Take 325 mg by mouth daily with breakfast.   Yes [provider]  insulin NPH Human (NOVOLIN N) 100 UNIT/ML injection Inject 28 Units into the skin.   Yes [provider]  Ipratropium-Albuterol (COMBIVENT RESPIMAT) 20-100 MCG/ACT AERS respimat Inhale 1 puff into the lungs 3 (three) times daily.   Yes [provider]  lisinopril (ZESTRIL) 10 MG tablet Take 10 mg by mouth daily.   Yes [provider]  mirtazapine (REMERON) 15 MG tablet Take 15 mg by mouth at bedtime. 10/26/20  Yes [provider]  multivitamin (RENA-VIT) TABS tablet Take 1 tablet by mouth at bedtime. 01/23/20  Yes Loletha Grayer, MD  potassium chloride (KLOR-CON) 10 MEQ tablet Take  10 mEq by mouth daily.   Yes [provider]  sodium chloride (OCEAN) 0.65 % SOLN nasal spray Place 1 spray into both nostrils as  needed for congestion. 01/23/20  Yes Wieting, Richard, MD  vitamin B-12 (CYANOCOBALAMIN) 100 MCG tablet Take 100 mcg by mouth daily.   Yes [provider]  acetaminophen (TYLENOL) 500 MG tablet Take 1,000 mg by mouth every 6 (six) hours as needed for moderate pain or headache.    [provider]  ipratropium-albuterol (DUONEB) 0.5-2.5 (3) MG/3ML SOLN Take 3 mLs by nebulization 3 (three) times daily. 10/11/19   Sidney Ace, MD     Critical care time: 36 min

## 2022-04-22 NOTE — Progress Notes (Signed)
Initial Nutrition Assessment  DOCUMENTATION CODES:   Not applicable  INTERVENTION:   Tube Feeding via OG:  Vital AF 1.2 at 50 ml/hr This provides  Additional calories from lipid provided via propofol   NUTRITION DIAGNOSIS:   Inadequate oral intake related to acute illness as evidenced by NPO status.  GOAL:   Patient will meet greater than or equal to 90% of their needs  MONITOR:   Labs, Weight trends, TF tolerance, Skin, Vent status  REASON FOR ASSESSMENT:   Consult, Ventilator Enteral/tube feeding initiation and management  ASSESSMENT:   86 yo female admitted with acute respiratory failure related to pulmonary edema, decompensated heart failure with preserved EF requiring intubation. PMH includes CHF, COPD, CKD, DM, HTN, sleep apnea  Pt remains on vent support, sedated on propofol. Not requiring pressor support  Unable to obtain diet and weight history from patient at this time  OG tube with tip/side port below diaphragm per chest xray  Current wt 89.4 kg from admit yesterday. No new weight. Noted pt with deep pitting edema in all extremities; dry weight unknown at current time.   Labs: Creatinine 1.63, BUN 29, CBGs wdl Meds: lasix, ss novolog, colace prn, miralax prn  NUTRITION - FOCUSED PHYSICAL EXAM:  Unable to assess  Diet Order:   Diet Order             Diet NPO time specified  Diet effective now                   EDUCATION NEEDS:   Not appropriate for education at this time  Skin:  Skin Assessment: Skin Integrity Issues: Skin Integrity Issues:: Other (Comment) Other: Irritant Dermatitis (Moisture Associated Skin Damage) to multiple areas  Last BM:  8/12  Height:   Ht Readings from Last 1 Encounters:  05/07/2022 '5\' 5"'$  (1.651 m)    Weight:   Wt Readings from Last 1 Encounters:  05/06/2022 89.4 kg     BMI:  Body mass index is 32.78 kg/m.  Estimated Nutritional Needs:   Kcal:  1400-1600 kcals  Protein:  75-90 g  Fluid:   1.5 L  Kerman Passey MS, RDN, LDN, CNSC Registered Dietitian 3 Clinical Nutrition RD Pager and On-Call Pager Number Located in [place]

## 2022-04-22 NOTE — Progress Notes (Signed)
Pharmacy Electrolyte Monitoring Consult:  Pharmacy consulted to assist in monitoring and replacing electrolytes in this 87 y.o. female admitted on 05/06/2022 with Respiratory Distress   Labs:  Sodium (mmol/L)  Date Value  04/22/2022 139  01/09/2015 141   Potassium (mmol/L)  Date Value  04/22/2022 3.9  01/09/2015 4.3   Magnesium (mg/dL)  Date Value  04/22/2022 2.1  01/11/2012 1.6 (L)   Phosphorus (mg/dL)  Date Value  04/22/2022 3.1   Calcium (mg/dL)  Date Value  04/22/2022 8.4 (L)   Calcium, Total (mg/dL)  Date Value  01/09/2015 9.2   Albumin (g/dL)  Date Value  04/22/2022 2.7 (L)  12/31/2014 3.9    Assessment/Plan: Electrolytes WNL Furosemide 80 mg IV BID ordered No replacement at this time F/u with labs orderd BID  Dorothy [name] A 04/22/2022 12:05 PM

## 2022-04-23 ENCOUNTER — Inpatient Hospital Stay: Payer: Medicare Other

## 2022-04-23 LAB — GLUCOSE, CAPILLARY
Glucose-Capillary: 188 mg/dL — ABNORMAL HIGH (ref 70–99)
Glucose-Capillary: 192 mg/dL — ABNORMAL HIGH (ref 70–99)
Glucose-Capillary: 209 mg/dL — ABNORMAL HIGH (ref 70–99)
Glucose-Capillary: 236 mg/dL — ABNORMAL HIGH (ref 70–99)
Glucose-Capillary: 248 mg/dL — ABNORMAL HIGH (ref 70–99)
Glucose-Capillary: 273 mg/dL — ABNORMAL HIGH (ref 70–99)

## 2022-04-23 LAB — CBC
HCT: 36.5 % (ref 36.0–46.0)
Hemoglobin: 11.4 g/dL — ABNORMAL LOW (ref 12.0–15.0)
MCH: 27.3 pg (ref 26.0–34.0)
MCHC: 31.2 g/dL (ref 30.0–36.0)
MCV: 87.3 fL (ref 80.0–100.0)
Platelets: 173 10*3/uL (ref 150–400)
RBC: 4.18 MIL/uL (ref 3.87–5.11)
RDW: 15.1 % (ref 11.5–15.5)
WBC: 9.7 10*3/uL (ref 4.0–10.5)
nRBC: 0 % (ref 0.0–0.2)

## 2022-04-23 LAB — BASIC METABOLIC PANEL
Anion gap: 10 (ref 5–15)
Anion gap: 9 (ref 5–15)
BUN: 37 mg/dL — ABNORMAL HIGH (ref 8–23)
BUN: 38 mg/dL — ABNORMAL HIGH (ref 8–23)
CO2: 27 mmol/L (ref 22–32)
CO2: 28 mmol/L (ref 22–32)
Calcium: 7.8 mg/dL — ABNORMAL LOW (ref 8.9–10.3)
Calcium: 8 mg/dL — ABNORMAL LOW (ref 8.9–10.3)
Chloride: 103 mmol/L (ref 98–111)
Chloride: 104 mmol/L (ref 98–111)
Creatinine, Ser: 1.72 mg/dL — ABNORMAL HIGH (ref 0.44–1.00)
Creatinine, Ser: 1.64 mg/dL — ABNORMAL HIGH (ref 0.44–1.00)
GFR, Estimated: 29 mL/min — ABNORMAL LOW (ref >60)
GFR, Estimated: 30 mL/min — ABNORMAL LOW (ref >60)
Glucose, Bld: 275 mg/dL — ABNORMAL HIGH (ref 70–99)
Glucose, Bld: 191 mg/dL — ABNORMAL HIGH (ref 70–99)
Potassium: 3.6 mmol/L (ref 3.5–5.1)
Potassium: 4.4 mmol/L (ref 3.5–5.1)
Sodium: 140 mmol/L (ref 135–145)
Sodium: 141 mmol/L (ref 135–145)

## 2022-04-23 LAB — PHOSPHORUS: Phosphorus: 2.3 mg/dL — ABNORMAL LOW (ref 2.5–4.6)

## 2022-04-23 LAB — PROCALCITONIN: Procalcitonin: 0.28 ng/mL

## 2022-04-23 LAB — MAGNESIUM: Magnesium: 2 mg/dL (ref 1.7–2.4)

## 2022-04-23 MED ORDER — FUROSEMIDE 10 MG/ML IJ SOLN: 80.0000 mg | Freq: Every day | INTRAMUSCULAR | Status: DC

## 2022-04-23 MED ORDER — POTASSIUM CHLORIDE 20 MEQ PO PACK
20.0000 meq | PACK | ORAL | Status: AC
  Administered 2022-04-23 (×2): 20 meq
  Filled 2022-04-23 (×2): qty 1

## 2022-04-23 MED ORDER — POTASSIUM & SODIUM PHOSPHATES 280-160-250 MG PO PACK
2.0000 | PACK | Freq: Once | ORAL | Status: AC
  Administered 2022-04-23: 2
  Filled 2022-04-23: qty 2

## 2022-04-23 NOTE — Progress Notes (Signed)
Pharmacy Electrolyte Monitoring Consult:  Pharmacy consulted to assist in monitoring and replacing electrolytes in this 87 y.o. female admitted on 05/07/2022 with Respiratory Distress   Labs:  Sodium (mmol/L)  Date Value  04/23/2022 141  01/09/2015 141   Potassium (mmol/L)  Date Value  04/23/2022 4.4  01/09/2015 4.3   Magnesium (mg/dL)  Date Value  04/23/2022 2.0  01/11/2012 1.6 (L)   Phosphorus (mg/dL)  Date Value  04/23/2022 2.3 (L)   Calcium (mg/dL)  Date Value  04/23/2022 8.0 (L)   Calcium, Total (mg/dL)  Date Value  01/09/2015 9.2   Albumin (g/dL)  Date Value  04/22/2022 2.7 (L)  12/31/2014 3.9    Assessment/Plan: 8/13 1705   K 4.4   Phos 2.3   Scr 1.64 Furosemide 80 mg IV daily Will order Phos-NaK packet 2 packets per tube x1 doses F/u with electrolyte labs ordered BID  Alayjah Boehringer A 04/23/2022 6:08 PM

## 2022-04-23 NOTE — Progress Notes (Addendum)
NAME:  Dorothy [name], MRN:  [id number], DOB:  [birth year], LOS: 2 ADMISSION DATE:  04/15/2022, CONSULTATION DATE:  04/23/2022 CHIEF COMPLAINT:  Shortness of breath  History of Present Illness:  Dorothy [name] is an 87 year old female with a history of multiple medical problems that include atrial fibrillation (on Eliquis), congestive heart failure, COPD, CKD, diabetes, sleep apnea (not on CPAP), and hypertension who presents to the hospital with altered mental status and shortness of breath.  Presentation to the ER, she was altered and history was limited.  She was placed on BiPAP given her hypoxia though she was unable to tolerate it and following a conversation with her family, decision was made to intubate her and place her on mechanical ventilation.  Collateral was obtained by calling her granddaughter Dorothy [name].  She [name] that she last saw her grandmother on Wednesday.  On the days when she is not there, the patient communicates with her daughters and she did call her daughter on Thursday night.  Today, Dorothy [name] found Dorothy [name] altered and unresponsive prompting her to call EMS.  Further history obtained indicates that she had "fluid buildup in her legs" around 3 to 4 weeks ago for which she required some Lasix.  Dorothy [name] that the medical providers were hesitant to give her more diuretics given her CKD.  Granddaughter also [name] that she had an episode of decompensated heart failure around 4 to 5 years ago.   Pertinent  Medical History  1. atrial fibrillation (on 2.5 mg of Eliquis twice daily) 2.  Congestive heart failure 3.  CKD 4.  COPD 5.  Type 2 diabetes (on insulin) 6.  Sleep apnea (not on CPAP) 7.  Hypertension 8.  Iron deficiency anemia (on IV iron)  Significant Hospital Events: Including procedures, antibiotic start and stop dates in addition to other pertinent events   8/11 presented to the ER, failed BiPAP, intubated 8/12 remains intubated, responding to IV  furosemide bid with adequate urine output. Low grade fever this AM. 8/13 remains intubated. Obtunded and non-responsive off sedation  Interim History / Subjective:  Unable to obtain, mechanically ventilated, and minimally responsive.  Objective   Blood pressure (!) 110/55, pulse (!) 52, temperature 99.9 F (37.7 C), resp. rate 18, height _0  (1.651 m), weight 79.7 kg, SpO2 98 %.    Vent Mode: PRVC FiO2 (%):  [40 %] 40 % Set Rate:  [18 bmp] 18 bmp Vt Set:  [450 mL] 450 mL PEEP:  [5 cmH20] 5 cmH20 Plateau Pressure:  [15 cmH20] 15 cmH20   Intake/Output Summary (Last 24 hours) at 04/23/2022 0857 Last data filed at 04/23/2022 0700 Gross per 24 hour  Intake 850.27 ml  Output 3550 ml  Net -2699.73 ml    Filed Weights   04/17/2022 1451 04/23/22 0500  Weight: 89.4 kg 79.7 kg    Examination: General: Ill-appearing elderly white female.  Not in acute distress. HENT: ET tube in place, pupils equal and reactive, no scleral icterus. Lungs: Bilateral Rales and rhonchi on anterior auscultation. Cardiovascular: Regular rate and rhythm, no murmurs noted. Abdomen: Distended, positive bowel sounds, nontender Extremities: Lower extremity edema to the level of the mid thighs.  Changes of chronic stasis dermatitis over the bilateral shins Neuro: Unable to assess  Resolved Hospital Problem list     Assessment & Plan:   Ahniyah Giancola is an 87 year old female with a history of multiple medical problems most notable for congestive heart failure, atrial fibrillation, CKD, sleep apnea, and  type 2 diabetes who presents to the hospital with altered mental status and acute hypoxic respiratory failure requiring endotracheal intubation and mechanical ventilation.  #Acute hypoxic respiratory failure #Pulmonary edema  Patient presented with acute hypoxia that failed trial of BiPAP.  She was intubated and currently has an 7.0 ET tube and mechanically ventilated with minimal vent settings and saturating  well.  Initial CXR was notable for bilateral pulmonary edema and small pleural effusions. She is managed with mechanical ventilation and IF diuresis with improvement. Repeat CXR shows improvement in her pulmonary edema. Vent settings remain minimal and current barrier to extubation is mental status.  Blood cultures were drawn and are pending. Procalcitonin level was 0.1 on admission, and respiratory viral panel was negative by PCR. Urinary S. Pneumo antigen was negative while legionella urinary antigen is pending. Respiratory cultures remain negative to date.  -Full vent support, implement lung protective strategies -Plateau pressures less than 30 cm H20 -Wean FiO2 & PEEP as tolerated to maintain O2 sats >92% -Follow intermittent Chest X-ray & ABG as needed -Spontaneous Breathing Trials when respiratory parameters met and mental status permits -Implement VAP Bundle -Prn Bronchodilators -Maintain a RASS goal of 0  #Decompensated heart failure with preserved ejection fraction  History of HFpEF and follows with Dr. Saralyn Pilar over at [facility].  Her last TTE prior to this admission was in 2018 showing an EF of 55 to 60% with moderate mitral regurgitation. Repeat TTE during this admit shows an EF of 60-65% with normal RV and LV function and a dilated RA.  She had multiple admissions for respiratory failure secondary to combination of COPD/HFpEF and is maintained on Eliquis for anticoagulation for her atrial fibrillation.  Imaging is notable for bilateral pulmonary edema and pleural effusions with acute hypoxic respiratory failure.  Her BNP on admission was elevated at 494.  While she does have CKD, it is my impression that she is critically ill and will require aggressive IV diuresis for volume management.  To that effect, I will continue her on IV furosemide and decrease dosing to 80 mg daily and closely monitor her electrolytes.  - Furosemide 80 mg IV daily - bid BMP, Mag, Phos - monitor  I's and O's  #Altered Mental Status #Toxic Metabolic Encephalopathy  Presented with AMS and was intubated in the ED. She has been off sedation for 48 hours and has remained obtunded. CT prior to transfer to the ICU did not show any bleeding. Will obtain an MRI to rule out stroke.  -MRI brain without contrast  #CKD  Patient is known to have CKD and her creatinine today is at 1.72 which is close to her baseline, but with a rising BUN. Will renally adjust her medications per her creatinine clearance.  #Afib  Resume eliquis 2.5 mg bid, place on telemetry.  #Type 2 Diabetes   Insulin sliding scale  -CBG's q4h; Target range of 140 to 180 -SSI -Follow ICU Hypo/Hyperglycemia protocol   Best Practice (right click and "Reselect all SmartList Selections" daily)   Diet/type: tubefeeds, consult to dietary placed DVT prophylaxis: DOAC GI prophylaxis: PPI Lines: N/A Foley:  Yes, and it is still needed Code Status:  limited   Labs   CBC: Recent Labs  Lab 04/14/2022 1440 04/22/22 0453 04/23/22 0521  WBC 11.1* 10.7* 9.7  NEUTROABS 10.3*  --   --   HGB 12.8 11.2* 11.4*  HCT 44.0 36.5 36.5  MCV 93.6 88.4 87.3  PLT 200 184 173     Basic Metabolic  Panel: Recent Labs  Lab 04/20/2022 1440 04/28/2022 1814 04/22/22 0453 04/22/22 1452 04/22/22 1548 04/23/22 0521  NA 138  --  139  --  140 140  K 4.5  --  3.9  --  3.7 3.6  CL 107  --  105  --  105 103  CO2 20*  --  24  --  22 27  GLUCOSE 90  --  95  --  108* 275*  BUN 28*  --  29*  --  29* 37*  CREATININE 1.52*  --  1.63*  --  1.69* 1.72*  CALCIUM 8.7*  --  8.4*  --  8.2* 7.8*  MG  --  2.6* 2.1 2.0 2.0  --   PHOS  --  3.9 3.1 3.2 3.2  --     GFR: Estimated Creatinine Clearance: 25 mL/min (A) (by C-G formula based on SCr of 1.72 mg/dL (H)). Recent Labs  Lab 04/20/2022 1440 04/30/2022 1622 04/30/2022 1814 04/22/22 0453 04/23/22 0521  PROCALCITON  --   --  <0.10 0.19 0.28  WBC 11.1*  --   --  10.7* 9.7  LATICACIDVEN 0.7 1.0   --   --   --      Liver Function Tests: Recent Labs  Lab 04/23/2022 1440 04/22/22 0453  AST 19 21  ALT 15 14  ALKPHOS 115 83  BILITOT 2.1* 1.9*  PROT 6.8 5.4*  ALBUMIN 3.5 2.7*    No results for input(s): "LIPASE", "AMYLASE" in the last 168 hours. No results for input(s): "AMMONIA" in the last 168 hours.  ABG    Component Value Date/Time   PHART 7.39 04/25/2022 0830   PCO2ART 40 04/20/2022 0830   PO2ART 51 (L) 04/18/2022 0830   HCO3 24.0 04/20/2022 1502   ACIDBASEDEF 3.3 (H) 04/23/2022 1502   O2SAT 85.9 04/13/2022 1502     Coagulation Profile: Recent Labs  Lab 04/12/2022 1814  INR 1.2     Cardiac Enzymes: No results for input(s): "CKTOTAL", "CKMB", "CKMBINDEX", "TROPONINI" in the last 168 hours.  HbA1C: Hemoglobin A1C  Date/Time Value Ref Range Status  01/11/2012 04:19 AM 9.6 (H) 4.2 - 6.3 % Final    Comment:    The American Diabetes Association recommends that a primary goal of therapy should be <7% and that physicians should reevaluate the treatment regimen in patients with HbA1c values consistently >8%.    Hgb A1c MFr Bld  Date/Time Value Ref Range Status  01/16/2020 05:13 AM 6.4 (H) 4.8 - 5.6 % Final    Comment:    (NOTE) Pre diabetes:          5.7%-6.4% Diabetes:              >6.4% Glycemic control for   <7.0% adults with diabetes   10/07/2019 06:29 AM 6.4 (H) 4.8 - 5.6 % Final    Comment:    (NOTE) Pre diabetes:          5.7%-6.4% Diabetes:              >6.4% Glycemic control for   <7.0% adults with diabetes     CBG: Recent Labs  Lab 04/22/22 1626 04/22/22 1941 04/22/22 2318 04/23/22 0339 04/23/22 0801  GLUCAP 122* 171* 235* 209* 248*     Review of Systems:   Unable to obtain  Past Medical History:  She,  has a past medical history of Anemia, Arthritis, Atrial fibrillation ([place]), CHF (congestive heart failure) ([place]), COPD (chronic obstructive pulmonary disease) ([place]), Diabetes mellitus  without complication ([place]), Dyspnea, GERD  (gastroesophageal reflux disease), Hyperlipidemia, Hypertension, PONV (postoperative nausea and vomiting), Sleep apnea, and Wears dentures.   Surgical History:   Past Surgical History:  Procedure Laterality Date   ABDOMINAL HYSTERECTOMY     CATARACT EXTRACTION W/PHACO Right 07/12/2016   Procedure: CATARACT EXTRACTION PHACO AND INTRAOCULAR LENS PLACEMENT (IOC);  Surgeon: Leandrew Koyanagi, MD;  Location: [place];  Service: Ophthalmology;  Laterality: Right;  DIABETIC - insulin and oral meds sleep apnea   CATARACT EXTRACTION W/PHACO Left 04/09/2019   Procedure: CATARACT EXTRACTION PHACO AND INTRAOCULAR LENS PLACEMENT ([place])  LEFT DIABETIC;  Surgeon: Leandrew Koyanagi, MD;  Location: [place];  Service: Ophthalmology;  Laterality: Left;  Diabetic - insulin and oral meds   CHOLECYSTECTOMY     COLONOSCOPY N/A 01/19/2015   Procedure: COLONOSCOPY;  Surgeon: Josefine Class, MD;  Location[facility] ENDOSCOPY;  Service: Endoscopy;  Laterality: N/A;   COLONOSCOPY WITH PROPOFOL N/A 07/11/2017   Procedure: COLONOSCOPY WITH PROPOFOL;  Surgeon: Toledo, Benay Pike, MD;  Location: [facility] ENDOSCOPY;  Service: Gastroenterology;  Laterality: N/A;   DIALYSIS/PERMA CATHETER INSERTION N/A 01/19/2020   Procedure: DIALYSIS/PERMA CATHETER INSERTION;  Surgeon: Algernon Huxley, MD;  Location: [place] CV LAB;  Service: Cardiovascular;  Laterality: N/A;   DIALYSIS/PERMA CATHETER REMOVAL N/A 03/18/2020   Procedure: DIALYSIS/PERMA CATHETER REMOVAL;  Surgeon: Algernon Huxley, MD;  Location: [place] CV LAB;  Service: Cardiovascular;  Laterality: N/A;   ESOPHAGOGASTRODUODENOSCOPY N/A 01/19/2015   Procedure: ESOPHAGOGASTRODUODENOSCOPY (EGD);  Surgeon: Josefine Class, MD;  Location: [facility] ENDOSCOPY;  Service: Endoscopy;  Laterality: N/A;   ESOPHAGOGASTRODUODENOSCOPY (EGD) WITH PROPOFOL N/A 07/11/2017   Procedure: ESOPHAGOGASTRODUODENOSCOPY (EGD) WITH PROPOFOL;  Surgeon: Toledo, Benay Pike, MD;   Location: [facility] ENDOSCOPY;  Service: Gastroenterology;  Laterality: N/A;   SHOULDER ARTHROSCOPY       Social History:   [name] that she has never smoked. She has never used smokeless tobacco. She [name] that she does not drink alcohol and does not use drugs.   Family History:  Her family history is not on file.   Allergies Allergies  Allergen Reactions   Tramadol Anaphylaxis   Aspirin     "Stomach problems"     Home Medications  Prior to Admission medications   Medication Sig Start Date End Date Taking? Authorizing Provider  albuterol (VENTOLIN HFA) 108 (90 Base) MCG/ACT inhaler Inhale 2 puffs into the lungs every 6 (six) hours as needed for wheezing or shortness of breath. 10/11/19  Yes Sreenath, Sudheer B, MD  amLODipine (NORVASC) 10 MG tablet Take 10 mg by mouth daily. 10/26/20  Yes [provider]  apixaban (ELIQUIS) 2.5 MG TABS tablet Take 2.5 mg by mouth 2 (two) times daily.    Yes [provider]  atorvastatin (LIPITOR) 80 MG tablet Take 80 mg by mouth daily.   Yes [provider]  Calcium Carb-Cholecalciferol (CALCIUM 600 + D PO) Take 1 tablet by mouth in the morning and at bedtime.    Yes [provider]  cloNIDine (CATAPRES) 0.1 MG tablet Take 0.1 mg by mouth 2 (two) times daily.   Yes [provider]  colestipol (COLESTID) 1 g tablet Take 1 g by mouth 2 (two) times daily.   Yes [provider]  empagliflozin (JARDIANCE) 10 MG TABS tablet  02/16/20  Yes [provider]  esomeprazole (NEXIUM) 20 MG capsule Take 20 mg by mouth at bedtime.   Yes [provider]  ferrous sulfate 325 (65 FE) MG  tablet Take 325 mg by mouth daily with breakfast.   Yes [provider]  insulin NPH Human (NOVOLIN N) 100 UNIT/ML injection Inject 28 Units into the skin.   Yes [provider]  Ipratropium-Albuterol (COMBIVENT RESPIMAT) 20-100 MCG/ACT AERS respimat Inhale 1 puff into the lungs 3 (three) times daily.    Yes [provider]  lisinopril (ZESTRIL) 10 MG tablet Take 10 mg by mouth daily.   Yes [provider]  mirtazapine (REMERON) 15 MG tablet Take 15 mg by mouth at bedtime. 10/26/20  Yes [provider]  multivitamin (RENA-VIT) TABS tablet Take 1 tablet by mouth at bedtime. 01/23/20  Yes Wieting, Richard, MD  potassium chloride (KLOR-CON) 10 MEQ tablet Take 10 mEq by mouth daily.   Yes [provider]  sodium chloride (OCEAN) 0.65 % SOLN nasal spray Place 1 spray into both nostrils as needed for congestion. 01/23/20  Yes Wieting, Richard, MD  vitamin B-12 (CYANOCOBALAMIN) 100 MCG tablet Take 100 mcg by mouth daily.   Yes [provider]  acetaminophen (TYLENOL) 500 MG tablet Take 1,000 mg by mouth every 6 (six) hours as needed for moderate pain or headache.    [provider]  ipratropium-albuterol (DUONEB) 0.5-2.5 (3) MG/3ML SOLN Take 3 mLs by nebulization 3 (three) times daily. 10/11/19   Sidney Ace, MD     Critical care time: 17 minutes    Armando Reichert, MD [facility]

## 2022-04-23 NOTE — Progress Notes (Signed)
Pharmacy Electrolyte Monitoring Consult:  Pharmacy consulted to assist in monitoring and replacing electrolytes in this 87 y.o. female admitted on 04/17/2022 with Respiratory Distress   Labs:  Sodium (mmol/L)  Date Value  04/23/2022 140  01/09/2015 141   Potassium (mmol/L)  Date Value  04/23/2022 3.6  01/09/2015 4.3   Magnesium (mg/dL)  Date Value  04/22/2022 2.0  01/11/2012 1.6 (L)   Phosphorus (mg/dL)  Date Value  04/22/2022 3.2   Calcium (mg/dL)  Date Value  04/23/2022 7.8 (L)   Calcium, Total (mg/dL)  Date Value  01/09/2015 9.2   Albumin (g/dL)  Date Value  04/22/2022 2.7 (L)  12/31/2014 3.9    Assessment/Plan: K 3.6   Scr 1.72 Furosemide 80 mg IV changed from BID to daily Will order KCL 20 meq per tube q4h x 2 dose F/u with electrolyte labs ordered BID  Gregrey Bloyd A 04/23/2022 9:51 AM

## 2022-04-24 ENCOUNTER — Inpatient Hospital Stay: Payer: Medicare Other

## 2022-04-24 DIAGNOSIS — G9340 Encephalopathy, unspecified: Secondary | ICD-10-CM | POA: Diagnosis not present

## 2022-04-24 DIAGNOSIS — J9601 Acute respiratory failure with hypoxia: Secondary | ICD-10-CM

## 2022-04-24 LAB — BLOOD GAS, ARTERIAL
Acid-Base Excess: 5.3 mmol/L — ABNORMAL HIGH (ref 0.0–2.0)
Bicarbonate: 29 mmol/L — ABNORMAL HIGH (ref 20.0–28.0)
FIO2: 40 %
O2 Saturation: 96.8 %
PEEP: 5 cmH2O
Patient temperature: 37
RATE: 18 resp/min
Spontaneous VT: 450 mL
pCO2 arterial: 38 mmHg (ref 32–48)
pH, Arterial: 7.49 — ABNORMAL HIGH (ref 7.35–7.45)
pO2, Arterial: 84 mmHg (ref 83–108)

## 2022-04-24 LAB — CBC
HCT: 34 % — ABNORMAL LOW (ref 36.0–46.0)
Hemoglobin: 10.7 g/dL — ABNORMAL LOW (ref 12.0–15.0)
MCH: 27.4 pg (ref 26.0–34.0)
MCHC: 31.5 g/dL (ref 30.0–36.0)
MCV: 87.2 fL (ref 80.0–100.0)
Platelets: 153 10*3/uL (ref 150–400)
RBC: 3.9 MIL/uL (ref 3.87–5.11)
RDW: 14.9 % (ref 11.5–15.5)
WBC: 8.8 10*3/uL (ref 4.0–10.5)
nRBC: 0 % (ref 0.0–0.2)

## 2022-04-24 LAB — URINALYSIS, COMPLETE (UACMP) WITH MICROSCOPIC
Bilirubin Urine: NEGATIVE
Glucose, UA: >=500 mg/dL — AB
Ketones, ur: 5 mg/dL — AB
Nitrite: NEGATIVE
Protein, ur: 30 mg/dL — AB
Specific Gravity, Urine: 1.017 (ref 1.005–1.030)
WBC, UA: >50 WBC/hpf — ABNORMAL HIGH (ref 0–5)
pH: 5 (ref 5.0–8.0)

## 2022-04-24 LAB — BASIC METABOLIC PANEL
Anion gap: 8 (ref 5–15)
BUN: 40 mg/dL — ABNORMAL HIGH (ref 8–23)
CO2: 28 mmol/L (ref 22–32)
Calcium: 7.8 mg/dL — ABNORMAL LOW (ref 8.9–10.3)
Chloride: 105 mmol/L (ref 98–111)
Creatinine, Ser: 1.46 mg/dL — ABNORMAL HIGH (ref 0.44–1.00)
GFR, Estimated: 35 mL/min — ABNORMAL LOW (ref >60)
Glucose, Bld: 278 mg/dL — ABNORMAL HIGH (ref 70–99)
Potassium: 3.9 mmol/L (ref 3.5–5.1)
Sodium: 141 mmol/L (ref 135–145)

## 2022-04-24 LAB — GLUCOSE, CAPILLARY
Glucose-Capillary: 268 mg/dL — ABNORMAL HIGH (ref 70–99)
Glucose-Capillary: 276 mg/dL — ABNORMAL HIGH (ref 70–99)
Glucose-Capillary: 253 mg/dL — ABNORMAL HIGH (ref 70–99)
Glucose-Capillary: 227 mg/dL — ABNORMAL HIGH (ref 70–99)
Glucose-Capillary: 275 mg/dL — ABNORMAL HIGH (ref 70–99)

## 2022-04-24 LAB — HEMOGLOBIN A1C
Hgb A1c MFr Bld: 5.5 % (ref 4.8–5.6)
Mean Plasma Glucose: 111.15 mg/dL

## 2022-04-24 LAB — BRAIN NATRIURETIC PEPTIDE: B Natriuretic Peptide: 253.7 pg/mL — ABNORMAL HIGH (ref 0.0–100.0)

## 2022-04-24 LAB — PHOSPHORUS: Phosphorus: 2.6 mg/dL (ref 2.5–4.6)

## 2022-04-24 LAB — MAGNESIUM: Magnesium: 1.9 mg/dL (ref 1.7–2.4)

## 2022-04-24 LAB — AMMONIA: Ammonia: 23 umol/L (ref 9–35)

## 2022-04-24 LAB — TSH: TSH: 1.968 u[IU]/mL (ref 0.350–4.500)

## 2022-04-24 MED ORDER — JUVEN PO PACK
1.0000 | PACK | Freq: Two times a day (BID) | ORAL | Status: DC
  Administered 2022-04-24 – 2022-04-25 (×2): 1

## 2022-04-24 MED ORDER — CEFTRIAXONE SODIUM 2 G IJ SOLR
2.0000 g | INTRAMUSCULAR | Status: DC
  Administered 2022-04-24: 2 g via INTRAVENOUS
  Filled 2022-04-24 (×2): qty 20

## 2022-04-24 MED ORDER — INSULIN ASPART 100 UNIT/ML IJ SOLN
3.0000 [IU] | INTRAMUSCULAR | Status: DC
Start: 2022-04-24 — End: 2022-04-25
  Administered 2022-04-24 – 2022-04-25 (×6): 3 [IU] via SUBCUTANEOUS
  Filled 2022-04-24 (×6): qty 1

## 2022-04-24 MED ORDER — INSULIN ASPART 100 UNIT/ML IJ SOLN
0.0000 [IU] | INTRAMUSCULAR | Status: DC
  Administered 2022-04-24: 5 [IU] via SUBCUTANEOUS
  Administered 2022-04-24 (×3): 8 [IU] via SUBCUTANEOUS
  Administered 2022-04-25 (×2): 5 [IU] via SUBCUTANEOUS
  Administered 2022-04-25: 3 [IU] via SUBCUTANEOUS
  Filled 2022-04-24 (×7): qty 1

## 2022-04-24 NOTE — Progress Notes (Signed)
Eeg done 

## 2022-04-24 NOTE — Procedures (Signed)
Routine EEG Report  Dorothy [name] is a 87 y.o. female with a history of coma of unknown cause who is undergoing an EEG to evaluate for seizures.  Report: This EEG was acquired with electrodes placed according to the International 10-20 electrode system (including Fp1, Fp2, F3, F4, C3, C4, P3, P4, O1, O2, T3, T4, T5, T6, A1, A2, Fz, Cz, Pz). The following electrodes were missing or displaced: none.  There was no clear posterior dominant rhythm. The background was primarily composed of continuous theta frequencies at 5-6 Hz. This activity was reactive to stimulation. Towards the end of the recording there were occasional brief periods of diffuse suppression lasting 1-2 seconds. There was no focal slowing. There were a few brief instances of sleep architecture observed. There were no interictal epileptiform discharges. There were no electrographic seizures identified. Photic stimulation and hyperventilation were not performed.   Impression and clinical correlation: This EEG was obtained while comatose and is abnormal sue to: - moderate diffuse slowing indicative of global cerebral dysfunction - brief periods of diffuse suppression lasting 1-2 seconds in the absence of sedation  Epileptiform abnormalities were not seen during this recording.  Su Monks, MD [facility] [phone]  If 7pm- 7am, please page neurology on call as listed in [place].

## 2022-04-24 NOTE — IPAL (Signed)
  Interdisciplinary Goals of Care Family Meeting   Date carried out: 04/24/2022  Location of the meeting: Conference room  Member's involved: Nurse Practitioner and Family Member or next of kin  Durable Power of Attorney or Loss adjuster, chartered: Pts daughter, 2 granddaughters, and another family member     Discussion: We discussed goals of care for [employer] .  Pt prognosis and current plan of care.  Family states although pts granddaughter Curt Jews is pts POA they do make decisions as a group. They agreed to continue current plan of care for now and if pt does not improve over the next few days will consider Comfort Measures Only.  If pt does improve from a mentation standpoint and can be extubated they would want to One Way Extubation   Code status: Limited Code or DNR with short term  Disposition: Continue current acute care  Time spent for the meeting: 15 minutes   Donell Beers, [place] Pager [phone] (please enter 7 digits) PCCM Consult Pager [phone] (please enter 7 digits)

## 2022-04-24 NOTE — Progress Notes (Signed)
Nutrition Follow Up Note   DOCUMENTATION CODES:   Not applicable  INTERVENTION:   Continue Vital AF 1.2 at 50 ml/hr  Free water flushes 35ml q4 hours to maintain tube patency   Regimen provides 1440kcal/day, 90g/day protein and 1168ml/day of free water.   Pt at high refeed risk; recommend monitor potassium, magnesium and phosphorus labs daily until stable  Juven Fruit Punch BID via tube, each serving provides 95kcal and 2.5g of protein (amino acids glutamine and arginine)  NUTRITION DIAGNOSIS:   Inadequate oral intake related to acute illness as evidenced by NPO status.  GOAL:   Provide needs based on ASPEN/SCCM guidelines -met   MONITOR:   Vent status, Labs, Weight trends, TF tolerance, Skin, I & O's  ASSESSMENT:   87 yo female admitted with acute respiratory failure related to pulmonary edema, decompensated heart failure with preserved EF requiring intubation. PMH includes CHF, COPD, CKD, DM, HTN, sleep apnea, B12 deficiency, IDA, GERD and CVA.  Pt remains sedated and ventilated. Pt tolerating tube feeds well at goal rate. Will add Juven as pt with widespread MASD. Per chart, pt is up ~22lbs from her UBW; pt -4.3L on her I & Os.   Of note, pt is edentulous and wears dentures.   Medications reviewed and include: insulin, protonix, ceftriaxone  Labs reviewed: K 3.9 wnl, BUN 40(H), creat 1.46(H), P 2.6 wnl, Mg 1.9 wnl Hgb 10.7(L), Hct 34.0(L) Cbgs- 253, 276, 268 x 24 hrs  Patient is currently intubated on ventilator support MV: 8.5 L/min Temp (24hrs), Avg:99.4 F (37.4 C), Min:98.4 F (36.9 C), Max:99.9 F (37.7 C)  Propofol: none   MAP- >30mmHg   UOP- 1264ml   NUTRITION - FOCUSED PHYSICAL EXAM:  Flowsheet Row Most Recent Value  Orbital Region Moderate depletion  Upper Arm Region No depletion  Thoracic and Lumbar Region No depletion  Buccal Region No depletion  Temple Region No depletion  Clavicle Bone Region No depletion  Clavicle and Acromion Bone  Region No depletion  Scapular Bone Region No depletion  Dorsal Hand No depletion  Patellar Region No depletion  Anterior Thigh Region No depletion  Posterior Calf Region No depletion  Edema (RD Assessment) Moderate  Hair Reviewed  Eyes Reviewed  Mouth Reviewed  Skin Reviewed  Nails Reviewed   Diet Order:   Diet Order             Diet NPO time specified  Diet effective now                  EDUCATION NEEDS:   Not appropriate for education at this time  Skin:  Skin Assessment: Skin Integrity Issues: Skin Integrity Issues:: Other (Comment) Other: Irritant Dermatitis (Moisture Associated Skin Damage) to multiple areas  Last BM:  8/14- type 7  Height:   Ht Readings from Last 1 Encounters:  04/25/2022 $RemoveB'5\' 5"'qbsaUXlC$  (1.651 m)    Weight:   Wt Readings from Last 1 Encounters:  04/23/22 79.7 kg     BMI:  Body mass index is 29.24 kg/m.  Estimated Nutritional Needs:   Kcal:  1400-1600 kcal/day  Protein:  85-95g/day  Fluid:  1.4-1.6L/day  Koleen Distance MS, RD, LDN Please refer to [facility] for RD and/or RD on-call/weekend/after hours pager

## 2022-04-24 NOTE — Inpatient Diabetes Management (Signed)
Inpatient Diabetes Program Recommendations  AACE/ADA: New Consensus Statement on Inpatient Glycemic Control (2015)  Target Ranges:  Prepandial:   less than 140 mg/dL      Peak postprandial:   less than 180 mg/dL (1-2 hours)      Critically ill patients:  140 - 180 mg/dL    Latest Reference Range & Units 04/22/22 23:18 04/23/22 03:39 04/23/22 08:01 04/23/22 11:20 04/23/22 15:55 04/23/22 19:42  Glucose-Capillary 70 - 99 mg/dL 235 (H)  2 units Novolog 209 (H)  1 unit Novolog 248 (H)  2 units Novolog 273 (H)  3 units Novolog  192 (H)  1unit Novolog 188 (H)  1 unit Novolog  (H): Data is abnormally high  Latest Reference Range & Units 04/23/22 23:43 04/24/22 03:16 04/24/22 07:28  Glucose-Capillary 70 - 99 mg/dL 236 (H)  2 units Novolog 268 (H)  3 units Novolog 276 (H)   (H): Data is abnormally high   Admit with:  Acute hypoxic respiratory failure Pulmonary edema Decompensated heart failure with preserved ejection fraction  History: DM, CKD, CHF  Home DM Meds: Jardiance 10 mg daily       NPH Insulin 28 units Daily  Current Orders: Novolog Moderate Correction Scale/ SSI (0-15 units) Q4 hours    MD- Note Novolog SSi increased to the Moderate scale (0-15 units) this AM  Note pt getting tube feeds 50cc/hr  Please consider starting Novolog Tube Feed coverage as well:  Novolog 3 units Q4 hours HOLD if Tube Feeds HELD for any reason    --Will follow patient during hospitalization--  Wyn Quaker RN, MSN, [place] Diabetes Coordinator Inpatient Glycemic Control Team Team Pager: [phone] (8a-5p)

## 2022-04-24 NOTE — Progress Notes (Signed)
Pharmacy Electrolyte Monitoring Consult:  Pharmacy consulted to assist in monitoring and replacing electrolytes in this 87 y.o. female admitted on 04/24/2022 with Respiratory Distress   Labs:  Sodium (mmol/L)  Date Value  04/24/2022 141  01/09/2015 141   Potassium (mmol/L)  Date Value  04/24/2022 3.9  01/09/2015 4.3   Magnesium (mg/dL)  Date Value  04/24/2022 1.9  01/11/2012 1.6 (L)   Phosphorus (mg/dL)  Date Value  04/24/2022 2.6   Calcium (mg/dL)  Date Value  04/24/2022 7.8 (L)   Calcium, Total (mg/dL)  Date Value  01/09/2015 9.2   Albumin (g/dL)  Date Value  04/22/2022 2.7 (L)  12/31/2014 3.9   Diuretics: IV Lasix 80 mg daily  Assessment/Plan: --No electrolyte replacement indicated at this time --Follow-up electrolytes with AM labs tomorrow   Benita Gutter 04/24/2022 8:06 AM

## 2022-04-24 NOTE — Progress Notes (Signed)
NAME:  SKILAR MARCOU, MRN:  [id number], DOB:  1935/02/10, LOS: 3 ADMISSION DATE:  05/10/2022, CONSULTATION DATE:  05/03/2022 CHIEF COMPLAINT:  Shortness of breath  History of Present Illness:  Ms. Striplin is an 87 year old female with a history of multiple medical problems that include atrial fibrillation (on Eliquis), congestive heart failure, COPD, CKD, diabetes, sleep apnea (not on CPAP), and hypertension who presents to the hospital with altered mental status and shortness of breath.  Presentation to the ER, she was altered and history was limited.  She was placed on BiPAP given her hypoxia though she was unable to tolerate it and following a conversation with her family, decision was made to intubate her and place her on mechanical ventilation.  Collateral was obtained by calling her granddaughter [name].  She reports that she last saw her grandmother on Wednesday.  On the days when she is not there, the patient communicates with her daughters and she did call her daughter on Thursday night.  Today, [name] found Ms. Cullifer altered and unresponsive prompting her to call EMS.  Further history obtained indicates that she had "fluid buildup in her legs" around 3 to 4 weeks ago for which she required some Lasix.  [name] reports that the medical providers were hesitant to give her more diuretics given her CKD.  Granddaughter also reports that she had an episode of decompensated heart failure around 4 to 5 years ago.  Pertinent  Medical History  Atrial fibrillation (on 2.5 mg of Eliquis twice daily) Congestive heart failure CKD COPD Type 2 diabetes (on insulin) Sleep apnea (not on CPAP) Hypertension Iron deficiency anemia (on IV iron)  Significant Hospital Events: Including procedures, antibiotic start and stop dates in addition to other pertinent events   8/11 presented to the ER, failed BiPAP, intubated 8/12 remains intubated, responding to IV furosemide bid with adequate urine  output. Low grade fever this AM. 8/13 remains intubated. Obtunded and non-responsive off sedation 8/14: remains obtunded and mechanically intubated.   Cultures:  COVID-19 08/11>>negative  MRSA PCR 08/11>>negative  Blood x2 08/11>>negative Blood 08/12 x2>>negative Respiratory panel by PCR 08/11>>negative  Respiratory 08/12>>  Interim History / Subjective:  Unable to obtain, mechanically ventilated, and minimally responsive.  Objective   Blood pressure (!) 134/50, pulse 61, temperature 98.7 F (37.1 C), temperature source Oral, resp. rate 18, height $RemoveBe'5\' 5"'aCnnkMYip$  (1.651 m), weight 79.7 kg, SpO2 98 %.    Vent Mode: PRVC FiO2 (%):  [40 %] 40 % Set Rate:  [18 bmp] 18 bmp Vt Set:  [450 mL] 450 mL PEEP:  [5 cmH20] 5 cmH20 Plateau Pressure:  [18 cmH20-22 cmH20] 22 cmH20   Intake/Output Summary (Last 24 hours) at 04/24/2022 2248 Last data filed at 04/24/2022 0800 Gross per 24 hour  Intake 853.33 ml  Output 1250 ml  Net -396.67 ml   Filed Weights   04/13/2022 1451 04/23/22 0500  Weight: 89.4 kg 79.7 kg   Examination: General: Acute on chronically ill appearing female, NAD mechanically intubated  HENT: Supple, no JVD  Lungs: Rhonchi throughout, even, non labored  Cardiovascular: Sinus rhythm with intermittent PVC's, rrr, no r/g, 2+ radial/1+ distal pulses, 1+ generalized edema  Abdomen: +BS x4, obese, non distended  Extremities: Normal bulk, chronic stasis dermatitis over the bilateral shins Neuro: Obtunded, not following commands, withdraws to painful stimulation, gag/corneal reflexes present, PERRL   Resolved Hospital Problem list     Assessment & Plan:   Acute hypoxic respiratory failure secondary to pulmonary edema  and bilateral pleural effusions  COPD - Full vent support for now: vent settings reviewed and established - SBT once all parameters met: mentation precludes ability to extubate - VAP bundle implemented - Intermittent CXR and ABG's  Decompensated HFpEF Paroxysmal  atrial fibrillation~rate controlled Hx: HLD and HTN    Echo 04/15/2022: EF 60 to 65%, mild mitral valve regurgitation, tricuspid valve regurgitation mild to moderate - Continuous telemetry monitoring  - Pt currently net negative 4L will hold off on additional diuresis for now  - Continue apixaban and atorvastatin  Type II diabetes mellitus  - CBG's q4hrs  - SSI and scheduled novolog 3 units C1ULA   Acute metabolic encephalopathy    MRI Brain 04/23/22: no acute intracranial abnormality. 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 Remote more focal cortical infarct involving the high posteromedial right frontal lobe. Mild sinus disease. - Avoid sedating medications when able  - EEG pending  - Will check UA    Best Practice (right click and "Reselect all SmartList Selections" daily)   Diet/type: tubefeeds DVT prophylaxis: DOAC GI prophylaxis: PPI Lines: N/A Foley:  Yes, and it is still needed Code Status:  limited  Will discuss pt prognosis and goals of care with pts daughters today 04/24/22 Labs   CBC: Recent Labs  Lab 04/14/2022 1440 04/22/22 0453 04/23/22 0521 04/24/22 0315  WBC 11.1* 10.7* 9.7 8.8  NEUTROABS 10.3*  --   --   --   HGB 12.8 11.2* 11.4* 10.7*  HCT 44.0 36.5 36.5 34.0*  MCV 93.6 88.4 87.3 87.2  PLT 200 184 173 453    Basic Metabolic Panel: Recent Labs  Lab 04/22/22 0453 04/22/22 1452 04/22/22 1548 04/23/22 0521 04/23/22 1705 04/24/22 0315  NA 139  --  140 140 141 141  K 3.9  --  3.7 3.6 4.4 3.9  CL 105  --  105 103 104 105  CO2 24  --  $R'22 27 28 28  'Ys$ GLUCOSE 95  --  108* 275* 191* 278*  BUN 29*  --  29* 37* 38* 40*  CREATININE 1.63*  --  1.69* 1.72* 1.64* 1.46*  CALCIUM 8.4*  --  8.2* 7.8* 8.0* 7.8*  MG 2.1 2.0 2.0  --  2.0 1.9  PHOS 3.1 3.2 3.2  --  2.3* 2.6   GFR: Estimated Creatinine Clearance: 29.4 mL/min (A) (by C-G formula based on SCr of 1.46 mg/dL (H)). Recent Labs  Lab 05/05/2022 1440 04/12/2022 1622  04/22/2022 1814 04/22/22 0453 04/23/22 0521 04/24/22 0315  PROCALCITON  --   --  <0.10 0.19 0.28  --   WBC 11.1*  --   --  10.7* 9.7 8.8  LATICACIDVEN 0.7 1.0  --   --   --   --     Liver Function Tests: Recent Labs  Lab 05/04/2022 1440 04/22/22 0453  AST 19 21  ALT 15 14  ALKPHOS 115 83  BILITOT 2.1* 1.9*  PROT 6.8 5.4*  ALBUMIN 3.5 2.7*   No results for input(s): "LIPASE", "AMYLASE" in the last 168 hours. No results for input(s): "AMMONIA" in the last 168 hours.  ABG    Component Value Date/Time   PHART 7.39 04/15/2022 0830   PCO2ART 40 04/19/2022 0830   PO2ART 51 (L) 04/24/2022 0830   HCO3 24.0 05/05/2022 1502   ACIDBASEDEF 3.3 (H) 04/20/2022 1502   O2SAT 85.9 05/06/2022 1502     Coagulation Profile: Recent Labs  Lab 04/16/2022 1814  INR 1.2  Cardiac Enzymes: No results for input(s): "CKTOTAL", "CKMB", "CKMBINDEX", "TROPONINI" in the last 168 hours.  HbA1C: Hemoglobin A1C  Date/Time Value Ref Range Status  01/11/2012 04:19 AM 9.6 (H) 4.2 - 6.3 % Final    Comment:    The American Diabetes Association recommends that a primary goal of therapy should be <7% and that physicians should reevaluate the treatment regimen in patients with HbA1c values consistently >8%.    Hgb A1c MFr Bld  Date/Time Value Ref Range Status  01/16/2020 05:13 AM 6.4 (H) 4.8 - 5.6 % Final    Comment:    (NOTE) Pre diabetes:          5.7%-6.4% Diabetes:              >6.4% Glycemic control for   <7.0% adults with diabetes   10/07/2019 06:29 AM 6.4 (H) 4.8 - 5.6 % Final    Comment:    (NOTE) Pre diabetes:          5.7%-6.4% Diabetes:              >6.4% Glycemic control for   <7.0% adults with diabetes     CBG: Recent Labs  Lab 04/23/22 1555 04/23/22 1942 04/23/22 2343 04/24/22 0316 04/24/22 0728  GLUCAP 192* 188* 236* 268* 276*    Review of Systems:   Unable to obtain  Past Medical History:  She,  has a past medical history of Anemia, Arthritis, Atrial  fibrillation ([place]), CHF (congestive heart failure) ([place]), COPD (chronic obstructive pulmonary disease) ([place]), Diabetes mellitus without complication ([place]), Dyspnea, GERD (gastroesophageal reflux disease), Hyperlipidemia, Hypertension, PONV (postoperative nausea and vomiting), Sleep apnea, and Wears dentures.   Surgical History:   Past Surgical History:  Procedure Laterality Date   ABDOMINAL HYSTERECTOMY     CATARACT EXTRACTION W/PHACO Right 07/12/2016   Procedure: CATARACT EXTRACTION PHACO AND INTRAOCULAR LENS PLACEMENT (IOC);  Surgeon: Leandrew Koyanagi, MD;  Location: [place];  Service: Ophthalmology;  Laterality: Right;  DIABETIC - insulin and oral meds sleep apnea   CATARACT EXTRACTION W/PHACO Left 04/09/2019   Procedure: CATARACT EXTRACTION PHACO AND INTRAOCULAR LENS PLACEMENT ([place])  LEFT DIABETIC;  Surgeon: Leandrew Koyanagi, MD;  Location: [place];  Service: Ophthalmology;  Laterality: Left;  Diabetic - insulin and oral meds   CHOLECYSTECTOMY     COLONOSCOPY N/A 01/19/2015   Procedure: COLONOSCOPY;  Surgeon: Josefine Class, MD;  Location: [facility] ENDOSCOPY;  Service: Endoscopy;  Laterality: N/A;   COLONOSCOPY WITH PROPOFOL N/A 07/11/2017   Procedure: COLONOSCOPY WITH PROPOFOL;  Surgeon: Toledo, Benay Pike, MD;  Location: [facility] ENDOSCOPY;  Service: Gastroenterology;  Laterality: N/A;   DIALYSIS/PERMA CATHETER INSERTION N/A 01/19/2020   Procedure: DIALYSIS/PERMA CATHETER INSERTION;  Surgeon: Algernon Huxley, MD;  Location: [place] [facility];  Service: Cardiovascular;  Laterality: N/A;   DIALYSIS/PERMA CATHETER REMOVAL N/A 03/18/2020   Procedure: DIALYSIS/PERMA CATHETER REMOVAL;  Surgeon: Algernon Huxley, MD;  Location: [place] [facility];  Service: Cardiovascular;  Laterality: N/A;   ESOPHAGOGASTRODUODENOSCOPY N/A 01/19/2015   Procedure: ESOPHAGOGASTRODUODENOSCOPY (EGD);  Surgeon: Josefine Class, MD;  Location: [facility] ENDOSCOPY;  Service: Endoscopy;  Laterality:  N/A;   ESOPHAGOGASTRODUODENOSCOPY (EGD) WITH PROPOFOL N/A 07/11/2017   Procedure: ESOPHAGOGASTRODUODENOSCOPY (EGD) WITH PROPOFOL;  Surgeon: Toledo, Benay Pike, MD;  Location: [facility] ENDOSCOPY;  Service: Gastroenterology;  Laterality: N/A;   SHOULDER ARTHROSCOPY       Social History:   reports that she has never smoked. She has never used smokeless tobacco. She reports that she  does not drink alcohol and does not use drugs.   Family History:  Her family history is not on file.   Allergies Allergies  Allergen Reactions   Tramadol Anaphylaxis   Aspirin     "Stomach problems"     Home Medications  Prior to Admission medications   Medication Sig Start Date End Date Taking? Authorizing Provider  albuterol (VENTOLIN HFA) 108 (90 Base) MCG/ACT inhaler Inhale 2 puffs into the lungs every 6 (six) hours as needed for wheezing or shortness of breath. 10/11/19  Yes Sreenath, Sudheer B, MD  amLODipine (NORVASC) 10 MG tablet Take 10 mg by mouth daily. 10/26/20  Yes [provider]  apixaban (ELIQUIS) 2.5 MG TABS tablet Take 2.5 mg by mouth 2 (two) times daily.    Yes [provider]  atorvastatin (LIPITOR) 80 MG tablet Take 80 mg by mouth daily.   Yes [provider]  Calcium Carb-Cholecalciferol (CALCIUM 600 + D PO) Take 1 tablet by mouth in the morning and at bedtime.    Yes [provider]  cloNIDine (CATAPRES) 0.1 MG tablet Take 0.1 mg by mouth 2 (two) times daily.   Yes [provider]  colestipol (COLESTID) 1 g tablet Take 1 g by mouth 2 (two) times daily.   Yes [provider]  empagliflozin (JARDIANCE) 10 MG TABS tablet  02/16/20  Yes [provider]  esomeprazole (NEXIUM) 20 MG capsule Take 20 mg by mouth at bedtime.   Yes [provider]  ferrous sulfate 325 (65 FE) MG tablet Take 325 mg by mouth daily with breakfast.   Yes [provider]  insulin NPH Human (NOVOLIN N) 100 UNIT/ML injection Inject 28 Units into the  skin.   Yes [provider]  Ipratropium-Albuterol (COMBIVENT RESPIMAT) 20-100 MCG/ACT AERS respimat Inhale 1 puff into the lungs 3 (three) times daily.   Yes [provider]  lisinopril (ZESTRIL) 10 MG tablet Take 10 mg by mouth daily.   Yes [provider]  mirtazapine (REMERON) 15 MG tablet Take 15 mg by mouth at bedtime. 10/26/20  Yes [provider]  multivitamin (RENA-VIT) TABS tablet Take 1 tablet by mouth at bedtime. 01/23/20  Yes Wieting, Richard, MD  potassium chloride (KLOR-CON) 10 MEQ tablet Take 10 mEq by mouth daily.   Yes [provider]  sodium chloride (OCEAN) 0.65 % SOLN nasal spray Place 1 spray into both nostrils as needed for congestion. 01/23/20  Yes Wieting, Richard, MD  vitamin B-12 (CYANOCOBALAMIN) 100 MCG tablet Take 100 mcg by mouth daily.   Yes [provider]  acetaminophen (TYLENOL) 500 MG tablet Take 1,000 mg by mouth every 6 (six) hours as needed for moderate pain or headache.    [provider]  ipratropium-albuterol (DUONEB) 0.5-2.5 (3) MG/3ML SOLN Take 3 mLs by nebulization 3 (three) times daily. 10/11/19   Sidney Ace, MD     Critical care time: 23 minutes    Donell Beers, [place] Pager [phone] (please enter 7 digits) PCCM Consult Pager [phone] (please enter 7 digits)

## 2022-04-25 DIAGNOSIS — J9601 Acute respiratory failure with hypoxia: Secondary | ICD-10-CM | POA: Diagnosis not present

## 2022-04-25 LAB — CBC
HCT: 31.7 % — ABNORMAL LOW (ref 36.0–46.0)
Hemoglobin: 9.8 g/dL — ABNORMAL LOW (ref 12.0–15.0)
MCH: 27.1 pg (ref 26.0–34.0)
MCHC: 30.9 g/dL (ref 30.0–36.0)
MCV: 87.8 fL (ref 80.0–100.0)
Platelets: 117 10*3/uL — ABNORMAL LOW (ref 150–400)
RBC: 3.61 MIL/uL — ABNORMAL LOW (ref 3.87–5.11)
RDW: 15.1 % (ref 11.5–15.5)
WBC: 6 10*3/uL (ref 4.0–10.5)
nRBC: 0 % (ref 0.0–0.2)

## 2022-04-25 LAB — BASIC METABOLIC PANEL
Anion gap: 8 (ref 5–15)
BUN: 51 mg/dL — ABNORMAL HIGH (ref 8–23)
CO2: 29 mmol/L (ref 22–32)
Calcium: 8 mg/dL — ABNORMAL LOW (ref 8.9–10.3)
Chloride: 106 mmol/L (ref 98–111)
Creatinine, Ser: 1.29 mg/dL — ABNORMAL HIGH (ref 0.44–1.00)
GFR, Estimated: 41 mL/min — ABNORMAL LOW (ref >60)
Glucose, Bld: 193 mg/dL — ABNORMAL HIGH (ref 70–99)
Potassium: 3.5 mmol/L (ref 3.5–5.1)
Sodium: 143 mmol/L (ref 135–145)

## 2022-04-25 LAB — LEGIONELLA PNEUMOPHILA SEROGP 1 UR AG: L. pneumophila Serogp 1 Ur Ag: NEGATIVE

## 2022-04-25 LAB — CULTURE, RESPIRATORY W GRAM STAIN: Culture: NORMAL

## 2022-04-25 LAB — PHOSPHORUS: Phosphorus: 2.1 mg/dL — ABNORMAL LOW (ref 2.5–4.6)

## 2022-04-25 LAB — GLUCOSE, CAPILLARY
Glucose-Capillary: 159 mg/dL — ABNORMAL HIGH (ref 70–99)
Glucose-Capillary: 212 mg/dL — ABNORMAL HIGH (ref 70–99)
Glucose-Capillary: 225 mg/dL — ABNORMAL HIGH (ref 70–99)

## 2022-04-25 LAB — MAGNESIUM: Magnesium: 2 mg/dL (ref 1.7–2.4)

## 2022-04-25 LAB — CK: Total CK: 148 U/L (ref 38–234)

## 2022-04-25 MED ORDER — LORAZEPAM 2 MG/ML IJ SOLN: 0.5000 mg | INTRAMUSCULAR | Status: DC | PRN

## 2022-04-25 MED ORDER — MORPHINE BOLUS VIA INFUSION
5.0000 mg | INTRAVENOUS | Status: DC | PRN
  Administered 2022-04-25 – 2022-04-26 (×10): 5 mg via INTRAVENOUS

## 2022-04-25 MED ORDER — LORAZEPAM 2 MG/ML IJ SOLN
2.0000 mg | Freq: Once | INTRAMUSCULAR | Status: AC
  Administered 2022-04-25: 2 mg via INTRAVENOUS
  Filled 2022-04-25: qty 1

## 2022-04-25 MED ORDER — GLYCOPYRROLATE 1 MG PO TABS: 1.0000 mg | ORAL_TABLET | ORAL | Status: DC | PRN

## 2022-04-25 MED ORDER — INSULIN ASPART 100 UNIT/ML IJ SOLN: 4.0000 [IU] | INTRAMUSCULAR | Status: DC

## 2022-04-25 MED ORDER — GLYCOPYRROLATE 0.2 MG/ML IJ SOLN: 0.2000 mg | INTRAMUSCULAR | Status: DC | PRN

## 2022-04-25 MED ORDER — INSULIN DETEMIR 100 UNIT/ML ~~LOC~~ SOLN
6.0000 [IU] | Freq: Two times a day (BID) | SUBCUTANEOUS | Status: DC
  Administered 2022-04-25: 6 [IU] via SUBCUTANEOUS
  Filled 2022-04-25 (×2): qty 0.06

## 2022-04-25 MED ORDER — LORAZEPAM 2 MG/ML IJ SOLN
2.0000 mg | INTRAMUSCULAR | Status: DC | PRN
  Administered 2022-04-25 (×2): 2 mg via INTRAVENOUS
  Administered 2022-04-25: 4 mg via INTRAVENOUS
  Filled 2022-04-25: qty 2
  Filled 2022-04-25 (×2): qty 1

## 2022-04-25 MED ORDER — MORPHINE 100MG IN NS 100ML (1MG/ML) PREMIX INFUSION
0.0000 mg/h | INTRAVENOUS | Status: DC
  Administered 2022-04-25: 5 mg/h via INTRAVENOUS
  Administered 2022-04-25: 14 mg/h via INTRAVENOUS
  Administered 2022-04-25 – 2022-04-26 (×2): 20 mg/h via INTRAVENOUS
  Filled 2022-04-25 (×5): qty 100

## 2022-04-25 MED ORDER — POLYVINYL ALCOHOL 1.4 % OP SOLN
1.0000 [drp] | Freq: Four times a day (QID) | OPHTHALMIC | Status: DC | PRN
Start: 2022-04-25 — End: 2022-04-26

## 2022-04-25 MED ORDER — POTASSIUM & SODIUM PHOSPHATES 280-160-250 MG PO PACK
2.0000 | PACK | Freq: Once | ORAL | Status: AC
Start: 2022-04-25 — End: 2022-04-25
  Administered 2022-04-25: 2
  Filled 2022-04-25: qty 2

## 2022-04-25 MED ORDER — SODIUM CHLORIDE 0.9 % IV SOLN: INTRAVENOUS | Status: DC

## 2022-04-25 NOTE — Progress Notes (Signed)
NAME:  Dorothy [name], MRN:  [id number], DOB:  [birth year], LOS: 4 ADMISSION DATE:  05/05/2022, CONSULTATION DATE:  04/14/2022 CHIEF COMPLAINT:  Shortness of breath  History of Present Illness:  Dorothy [name] is an 87 year old female with a history of multiple medical problems that include atrial fibrillation (on Eliquis), congestive heart failure, COPD, CKD, diabetes, sleep apnea (not on CPAP), and hypertension who presents to the hospital with altered mental status and shortness of breath.  Presentation to the ER, she was altered and history was limited.  She was placed on BiPAP given her hypoxia though she was unable to tolerate it and following a conversation with her family, decision was made to intubate her and place her on mechanical ventilation.  Collateral was obtained by calling her granddaughter Dorothy [name].  She reports that she last saw her grandmother on Wednesday.  On the days when she is not there, the patient communicates with her daughters and she did call her daughter on Thursday night.  Today, Dorothy [name] found Dorothy [name] altered and unresponsive prompting her to call EMS.  Further history obtained indicates that she had "fluid buildup in her legs" around 3 to 4 weeks ago for which she required some Lasix.  Dorothy [name] reports that the medical providers were hesitant to give her more diuretics given her CKD.  Granddaughter also reports that she had an episode of decompensated heart failure around 4 to 5 years ago.  Pertinent  Medical History  Atrial fibrillation (on 2.5 mg of Eliquis twice daily) Congestive heart failure CKD COPD Type 2 diabetes (on insulin) Sleep apnea (not on CPAP) Hypertension Iron deficiency anemia (on IV iron)  Significant Hospital Events: Including procedures, antibiotic start and stop dates in addition to other pertinent events   8/11 presented to the ER, failed BiPAP, intubated 8/12 remains intubated, responding to IV furosemide bid with adequate urine  output. Low grade fever this AM. 8/13 remains intubated. Obtunded and non-responsive off sedation 8/14: remains obtunded and mechanically intubated.  8/14: EEG revealed moderate diffuse slowing indicative of        global cerebral dysfunction brief periods of diffuse suppression           lasting 1-2 seconds in the absence of sedation Epileptiform        abnormalities were not seen during this recording 8/15: remains obtunded on mechanical ventilation withdraws from painful stimulation only. Neurology consulted   Cultures:  COVID-19 08/11>>negative  MRSA PCR 08/11>>negative  Blood x2 08/11>>negative Blood 08/12 x2>>negative Respiratory panel by PCR 08/11>>negative  Respiratory 08/12>>  Interim History / Subjective:  Unable to obtain, mechanically ventilated, and minimally responsive.  Objective   Blood pressure (!) 123/45, pulse 62, temperature 98.8 F (37.1 C), temperature source Axillary, resp. rate 18, height 5' 5" (1.651 m), weight 81.4 kg, SpO2 96 %.    Vent Mode: PRVC FiO2 (%):  [40 %] 40 % Set Rate:  [18 bmp] 18 bmp Vt Set:  [450 mL] 450 mL PEEP:  [5 cmH20] 5 cmH20 Plateau Pressure:  [20 cmH20-22 cmH20] 21 cmH20   Intake/Output Summary (Last 24 hours) at 04/25/2022 0905 Last data filed at 04/25/2022 0800 Gross per 24 hour  Intake 1250.07 ml  Output 765 ml  Net 485.07 ml   Filed Weights   04/27/2022 1451 04/23/22 0500 04/25/22 0500  Weight: 89.4 kg 79.7 kg 81.4 kg   Examination: General: Acute on chronically ill appearing female, NAD mechanically intubated  HENT: Supple, no JVD  Lungs: Rhonchi throughout,  even, non labored  Cardiovascular: Sinus rhythm with intermittent PVC's, rrr, no r/g, 2+ radial/1+ distal pulses, 1+ generalized edema  Abdomen: +BS x4, obese, non distended  Extremities: Normal bulk, chronic stasis dermatitis over the bilateral shins Neuro: Obtunded, not following commands, withdraws to painful stimulation, gag/corneal reflexes present, PERRL    Resolved Hospital Problem list     Assessment & Plan:   Acute hypoxic respiratory failure secondary to pulmonary edema and bilateral pleural effusions  COPD - Full vent support for now: vent settings reviewed and established - SBT once all parameters met: mentation precludes ability to extubate - VAP bundle implemented - Intermittent CXR and ABG's  Decompensated HFpEF Paroxysmal atrial fibrillation~rate controlled Hx: HLD and HTN    Echo 04/13/2022: EF 60 to 65%, mild mitral valve regurgitation, tricuspid valve regurgitation mild to moderate - Continuous telemetry monitoring  - Pt currently net negative 3L will hold off on additional diuresis will assess daily for need of diuresis  - Continue apixaban and atorvastatin  Urinary tract infection  - Trend WBC and monitor fever curve  - Continue ceftriaxone   Type II diabetes mellitus  - CBG's q4hrs  - SSI and scheduled novolog  units [id number]   Acute metabolic encephalopathy etiology unclear    MRI Brain 04/23/22: no acute intracranial abnormality. 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 Remote more focal cortical infarct involving the high posteromedial right frontal lobe. Mild sinus disease. - Neurology consulted appreciate input: per neurology no indication for transfer for continuous EEG monitoring due to EEG on 08/14 revealing no epileptiform abnormalities  - Avoid sedating medications when able    Best Practice (right click and "Reselect all SmartList Selections" daily)   Diet/type: tubefeeds DVT prophylaxis: DOAC GI prophylaxis: PPI Lines: N/A Foley:  Yes, and it is still needed Code Status:  limited  Will discuss goals of care and pt prognosis when family arrives at bedside  Labs   CBC: Recent Labs  Lab 04/15/2022 1440 04/22/22 0453 04/23/22 0521 04/24/22 0315 04/25/22 0550  WBC 11.1* 10.7* 9.7 8.8 6.0  NEUTROABS 10.3*  --   --   --   --   HGB 12.8 11.2* 11.4* 10.7* 9.8*  HCT  44.0 36.5 36.5 34.0* 31.7*  MCV 93.6 88.4 87.3 87.2 87.8  PLT 200 184 173 153 117*    Basic Metabolic Panel: Recent Labs  Lab 04/22/22 1452 04/22/22 1548 04/23/22 0521 04/23/22 1705 04/24/22 0315 04/25/22 0550  NA  --  140 140 141 141 143  K  --  3.7 3.6 4.4 3.9 3.5  CL  --  105 103 104 105 106  CO2  --  _0 GLUCOSE  --  108* 275* 191* 278* 193*  BUN  --  29* 37* 38* 40* 51*  CREATININE  --  1.69* 1.72* 1.64* 1.46* 1.29*  CALCIUM  --  8.2* 7.8* 8.0* 7.8* 8.0*  MG 2.0 2.0  --  2.0 1.9 2.0  PHOS 3.2 3.2  --  2.3* 2.6 2.1*   GFR: Estimated Creatinine Clearance: 33.6 mL/min (A) (by C-G formula based on SCr of 1.29 mg/dL (H)). Recent Labs  Lab 05/07/2022 1440 04/17/2022 1622 05/03/2022 1814 04/22/22 0453 04/23/22 0521 04/24/22 0315 04/25/22 0550  PROCALCITON  --   --  <0.10 0.19 0.28  --   --   WBC 11.1*  --   --  10.7* 9.7 8.8 6.0  LATICACIDVEN 0.7 1.0  --   --   --   --   --  Liver Function Tests: Recent Labs  Lab 04/28/2022 1440 04/22/22 0453  AST 19 21  ALT 15 14  ALKPHOS 115 83  BILITOT 2.1* 1.9*  PROT 6.8 5.4*  ALBUMIN 3.5 2.7*   No results for input(s): "LIPASE", "AMYLASE" in the last 168 hours. Recent Labs  Lab 04/24/22 1046  AMMONIA 23    ABG    Component Value Date/Time   PHART 7.49 (H) 04/24/2022 0911   PCO2ART 38 04/24/2022 0911   PO2ART 84 04/24/2022 0911   HCO3 29.0 (H) 04/24/2022 0911   ACIDBASEDEF 3.3 (H) 05/02/2022 1502   O2SAT 96.8 04/24/2022 0911     Coagulation Profile: Recent Labs  Lab 04/14/2022 1814  INR 1.2    Cardiac Enzymes: No results for input(s): "CKTOTAL", "CKMB", "CKMBINDEX", "TROPONINI" in the last 168 hours.  HbA1C: Hemoglobin A1C  Date/Time Value Ref Range Status  01/11/2012 04:19 AM 9.6 (H) 4.2 - 6.3 % Final    Comment:    The American Diabetes Association recommends that a primary goal of therapy should be <7% and that physicians should reevaluate the treatment regimen in patients with HbA1c  values consistently >8%.    Hgb A1c MFr Bld  Date/Time Value Ref Range Status  04/23/2022 05:21 AM 5.5 4.8 - 5.6 % Final    Comment:    (NOTE) Pre diabetes:          5.7%-6.4%  Diabetes:              >6.4%  Glycemic control for   <7.0% adults with diabetes   01/16/2020 05:13 AM 6.4 (H) 4.8 - 5.6 % Final    Comment:    (NOTE) Pre diabetes:          5.7%-6.4% Diabetes:              >6.4% Glycemic control for   <7.0% adults with diabetes     CBG: Recent Labs  Lab 04/24/22 1607 04/24/22 1938 04/25/22 0010 04/25/22 0322 04/25/22 0736  GLUCAP 227* 275* 212* 159* 225*    Review of Systems:   Unable to obtain  Past Medical History:  She,  has a past medical history of Anemia, Arthritis, Atrial fibrillation ([place]), CHF (congestive heart failure) ([place]), COPD (chronic obstructive pulmonary disease) ([place]), Diabetes mellitus without complication ([place]), Dyspnea, GERD (gastroesophageal reflux disease), Hyperlipidemia, Hypertension, PONV (postoperative nausea and vomiting), Sleep apnea, and Wears dentures.   Surgical History:   Past Surgical History:  Procedure Laterality Date   ABDOMINAL HYSTERECTOMY     CATARACT EXTRACTION W/PHACO Right 07/12/2016   Procedure: CATARACT EXTRACTION PHACO AND INTRAOCULAR LENS PLACEMENT (IOC);  Surgeon: Leandrew Koyanagi, MD;  Location: [place];  Service: Ophthalmology;  Laterality: Right;  DIABETIC - insulin and oral meds sleep apnea   CATARACT EXTRACTION W/PHACO Left 04/09/2019   Procedure: CATARACT EXTRACTION PHACO AND INTRAOCULAR LENS PLACEMENT ([place])  LEFT DIABETIC;  Surgeon: Leandrew Koyanagi, MD;  Location: [place];  Service: Ophthalmology;  Laterality: Left;  Diabetic - insulin and oral meds   CHOLECYSTECTOMY     COLONOSCOPY N/A 01/19/2015   Procedure: COLONOSCOPY;  Surgeon: Josefine Class, MD;  Location: [facility] ENDOSCOPY;  Service: Endoscopy;  Laterality: N/A;   COLONOSCOPY WITH PROPOFOL N/A 07/11/2017    Procedure: COLONOSCOPY WITH PROPOFOL;  Surgeon: Toledo, Benay Pike, MD;  Location: [facility] ENDOSCOPY;  Service: Gastroenterology;  Laterality: N/A;   DIALYSIS/PERMA CATHETER INSERTION N/A 01/19/2020   Procedure: DIALYSIS/PERMA CATHETER INSERTION;  Surgeon: Algernon Huxley, MD;  Location: [place] INVASIVE CV  LAB;  Service: Cardiovascular;  Laterality: N/A;   DIALYSIS/PERMA CATHETER REMOVAL N/A 03/18/2020   Procedure: DIALYSIS/PERMA CATHETER REMOVAL;  Surgeon: Algernon Huxley, MD;  Location: [place] CV LAB;  Service: Cardiovascular;  Laterality: N/A;   ESOPHAGOGASTRODUODENOSCOPY N/A 01/19/2015   Procedure: ESOPHAGOGASTRODUODENOSCOPY (EGD);  Surgeon: Josefine Class, MD;  Location: [facility] ENDOSCOPY;  Service: Endoscopy;  Laterality: N/A;   ESOPHAGOGASTRODUODENOSCOPY (EGD) WITH PROPOFOL N/A 07/11/2017   Procedure: ESOPHAGOGASTRODUODENOSCOPY (EGD) WITH PROPOFOL;  Surgeon: Toledo, Benay Pike, MD;  Location: [facility] ENDOSCOPY;  Service: Gastroenterology;  Laterality: N/A;   SHOULDER ARTHROSCOPY       Social History:   reports that she has never smoked. She has never used smokeless tobacco. She reports that she does not drink alcohol and does not use drugs.   Family History:  Her family history is not on file.   Allergies Allergies  Allergen Reactions   Tramadol Anaphylaxis   Aspirin     "Stomach problems"     Home Medications  Prior to Admission medications   Medication Sig Start Date End Date Taking? Authorizing Provider  albuterol (VENTOLIN HFA) 108 (90 Base) MCG/ACT inhaler Inhale 2 puffs into the lungs every 6 (six) hours as needed for wheezing or shortness of breath. 10/11/19  Yes Sreenath, Sudheer B, MD  amLODipine (NORVASC) 10 MG tablet Take 10 mg by mouth daily. 10/26/20  Yes [provider]  apixaban (ELIQUIS) 2.5 MG TABS tablet Take 2.5 mg by mouth 2 (two) times daily.    Yes [provider]  atorvastatin (LIPITOR) 80 MG tablet Take 80 mg by mouth daily.   Yes [provider]  Calcium Carb-Cholecalciferol (CALCIUM 600 + D PO) Take 1 tablet by mouth in the morning and at bedtime.    Yes [provider]  cloNIDine (CATAPRES) 0.1 MG tablet Take 0.1 mg by mouth 2 (two) times daily.   Yes [provider]  colestipol (COLESTID) 1 g tablet Take 1 g by mouth 2 (two) times daily.   Yes [provider]  empagliflozin (JARDIANCE) 10 MG TABS tablet  02/16/20  Yes [provider]  esomeprazole (NEXIUM) 20 MG capsule Take 20 mg by mouth at bedtime.   Yes [provider]  ferrous sulfate 325 (65 FE) MG tablet Take 325 mg by mouth daily with breakfast.   Yes [provider]  insulin NPH Human (NOVOLIN N) 100 UNIT/ML injection Inject 28 Units into the skin.   Yes [provider]  Ipratropium-Albuterol (COMBIVENT RESPIMAT) 20-100 MCG/ACT AERS respimat Inhale 1 puff into the lungs 3 (three) times daily.   Yes [provider]  lisinopril (ZESTRIL) 10 MG tablet Take 10 mg by mouth daily.   Yes [provider]  mirtazapine (REMERON) 15 MG tablet Take 15 mg by mouth at bedtime. 10/26/20  Yes [provider]  multivitamin (RENA-VIT) TABS tablet Take 1 tablet by mouth at bedtime. 01/23/20  Yes Wieting, Richard, MD  potassium chloride (KLOR-CON) 10 MEQ tablet Take 10 mEq by mouth daily.   Yes [provider]  sodium chloride (OCEAN) 0.65 % SOLN nasal spray Place 1 spray into both nostrils as needed for congestion. 01/23/20  Yes Wieting, Richard, MD  vitamin B-12 (CYANOCOBALAMIN) 100 MCG tablet Take 100 mcg by mouth daily.   Yes [provider]  acetaminophen (TYLENOL) 500 MG tablet Take 1,000 mg by mouth every 6 (six) hours as needed for moderate pain or headache.    [provider]  ipratropium-albuterol (DUONEB) 0.5-2.5 (  3) MG/3ML SOLN Take 3 mLs by nebulization 3 (three) times daily. 10/11/19   Sidney Ace, MD     Critical care time: 66 minutes    Donell Beers, [place] Pager [phone] (please enter 7 digits) PCCM Consult Pager [phone] (please enter 7 digits)

## 2022-04-25 NOTE — Progress Notes (Signed)
eLink Physician-Brief Progress Note Patient Name: Dorothy [name] DOB: [birth year] MRN: [id number]   Date of Service  04/25/2022  HPI/Events of Note  Patient on comfort measures, family's perception is that she is sub-optimally sedated and possible in discomfort on current Morphine + Ativan regimen (mainly secondary to agonal respirations  eICU Interventions  PRN Ativan interval shortened in order to optimize comfort.        Kerry Kass Omarius Grantham 04/25/2022, 11:03 PM

## 2022-04-25 NOTE — Progress Notes (Signed)
   04/25/22 1300  Clinical Encounter Type  Visited With Patient and family together  Visit Type Initial;Patient actively dying  Referral From Nurse  Consult/Referral To Chaplain  Spiritual Encounters  Spiritual Needs Prayer;Grief support   Chaplain responded to call to provide support to family.

## 2022-04-25 NOTE — Progress Notes (Addendum)
0900 spoke with daughter on phone she states patient to go comfort care after 15 today family will be coming to bedside  0915 discussed with CC team daughter plan neuro consulted 13 family at bedside spoke with CC team doesn't want neurology and wants to move forward with comfort care measures after family all has time to say there goodbyes  1330 morphine gtt started 1413 patient extubated family at bedside

## 2022-04-25 NOTE — Progress Notes (Signed)
Patient extubated to comfort care per order. Patient placed on 2l [place] for comfort.

## 2022-04-25 NOTE — Progress Notes (Signed)
Pharmacy Electrolyte Monitoring Consult:  Pharmacy consulted to assist in monitoring and replacing electrolytes in this 87 y.o. female admitted on 04/12/2022 with Respiratory Distress  Labs:  Sodium (mmol/L)  Date Value  04/25/2022 143  01/09/2015 141   Potassium (mmol/L)  Date Value  04/25/2022 3.5  01/09/2015 4.3   Magnesium (mg/dL)  Date Value  04/25/2022 2.0  01/11/2012 1.6 (L)   Phosphorus (mg/dL)  Date Value  04/25/2022 2.1 (L)   Calcium (mg/dL)  Date Value  04/25/2022 8.0 (L)   Calcium, Total (mg/dL)  Date Value  01/09/2015 9.2   Albumin (g/dL)  Date Value  04/22/2022 2.7 (L)  12/31/2014 3.9    Assessment/Plan: --Phos-Nak 2 packets per tube x 1 dose --Follow-up electrolytes with AM labs tomorrow   Benita Gutter 04/25/2022 8:02 AM

## 2022-04-25 NOTE — Inpatient Diabetes Management (Signed)
Inpatient Diabetes Program Recommendations  AACE/ADA: New Consensus Statement on Inpatient Glycemic Control (2015)  Target Ranges:  Prepandial:   less than 140 mg/dL      Peak postprandial:   less than 180 mg/dL (1-2 hours)      Critically ill patients:  140 - 180 mg/dL    Latest Reference Range & Units 04/23/22 23:43 04/24/22 03:16 04/24/22 07:28 04/24/22 11:25 04/24/22 16:07 04/24/22 19:38  Glucose-Capillary 70 - 99 mg/dL 236 (H)  2 units Novolog  268 (H)  3 units Novolog  276 (H)  8 units Novolog '@0914'$  253 (H)  11 units Novolog  227 (H)  8 units Novolog  275 (H)  11 units Novolog   (H): Data is abnormally high  Latest Reference Range & Units 04/25/22 00:10 04/25/22 03:22 04/25/22 07:36  Glucose-Capillary 70 - 99 mg/dL 212 (H)  8 units Novolog  159 (H)  6 units Novolog  225 (H)  8 units Novolog   (H): Data is abnormally high   Home DM Meds: Jardiance 10 mg daily                             NPH Insulin 28 units Daily   Current Orders: Novolog Moderate Correction Scale/ SSI (0-15 units) Q4 hours      Novolog 3 units Q4 hours     MD- Note Novolog Tube Feed coverage started yesterday at 12pm  CBGs remain >200--Note that pt takes NPH insulin at home per med rec  Please consider:  1. Start split dose basal insulin based on weight: Levemir 6 units BID (0.15 units/kg)  2. Increase the Novolog Tube feed Coverage slightly to 4 units Q4 hours     --Will follow patient during hospitalization--  Wyn Quaker RN, MSN, [place] Diabetes Coordinator Inpatient Glycemic Control Team Team Pager: [phone] (8a-5p)

## 2022-04-25 NOTE — IPAL (Signed)
  Interdisciplinary Goals of Care Family Meeting   Date carried out: 04/25/2022  Location of the meeting: Conference room  Member's involved: Nurse Practitioner and Family Member or next of kin  Durable Power of Attorney or acting medical decision maker: Risk manager along with multiple family members     Discussion: We discussed goals of care for [employer] .  Would like to transition to Comfort Measures Only once all family arrive at bedside  Code status: Limited Code or DNR with short term  Disposition: In-patient comfort care  Time spent for the meeting: 15 minutes     Donell Beers, [place] Pager [phone] (please enter 7 digits) PCCM Consult Pager [phone] (please enter 7 digits)

## 2022-04-26 LAB — CULTURE, BLOOD (ROUTINE X 2)
Culture: NO GROWTH
Culture: NO GROWTH

## 2022-04-26 MED ORDER — LORAZEPAM 2 MG/ML IJ SOLN
1.0000 mg | INTRAMUSCULAR | Status: DC | PRN
  Administered 2022-04-26 (×2): 2 mg via INTRAVENOUS
  Filled 2022-04-26 (×2): qty 1

## 2022-04-27 LAB — CULTURE, BLOOD (ROUTINE X 2)
Culture: NO GROWTH
Culture: NO GROWTH
Special Requests: ADEQUATE
Special Requests: ADEQUATE

## 2022-05-12 NOTE — Death Summary Note (Signed)
DEATH SUMMARY   Patient Details  Name: Dorothy [name] MRN: [id number] DOB: 01/18/1935  Admission/Discharge Information   Admit Date:  May 11, 2022  Date of Death: Date of Death: 05-16-2022  Time of Death: Time of Death: 0419  Length of Stay: 5  Referring Physician: Letta Median, MD   Reason(s) for Hospitalization  Acute hypoxemic respiratory failure  Suspected anoxic brain injury   CKD3b   Fluid overloaded state of heart improved with diuresis   Frail elderly, FTT   HTN, HLD, Hx Afib   Brief Hospital Course (including significant findings, care, treatment, and services provided and events leading to death)  Dorothy [name] is an 87 year old female with a history of multiple medical problems that include atrial fibrillation (on Eliquis), congestive heart failure, COPD, CKD, diabetes, sleep apnea (not on CPAP), and hypertension who presents to the hospital with altered mental status and shortness of breath.   Presentation to the ER, she was altered and history was limited.  She was placed on BiPAP given her hypoxia though she was unable to tolerate it and following a conversation with her family, decision was made to intubate her and place her on mechanical ventilation.  Collateral was obtained by calling her granddaughter [name].  She reports that she last saw her grandmother on Wednesday.  On the days when she is not there, the patient communicates with her daughters and she did call her daughter on 13-Dec-2022 night.  Today, [name] found Ms. Calia altered and unresponsive prompting her to call EMS.  Further history obtained indicates that she had "fluid buildup in her legs" around 3 to 4 weeks ago for which she required some Lasix.  [name] reports that the medical providers were hesitant to give her more diuretics given her CKD.   Granddaughter also reports that she had an episode of decompensated heart failure around 4 to 5 years ago.  Patient was diuresed and kept on the  ventilator. Despite lack of sedation, she did not wake up.  MRI/EEG looked okay but remainder of exam c/w severe anoxic encephalopathy. After discussion regarding aggressiveness of further interventions with family, they decided they wanted Lyna to be allowed to pass in peace.  She was extubated and passed with family at bedside.  Pertinent Labs and Studies  Significant Diagnostic Studies EEG adult  Result Date: 04/24/2022 Derek Jack, MD     04/24/2022  8:21 PM Routine EEG Report JASHAE WIGGS is a 87 y.o. female with a history of coma of unknown cause who is undergoing an EEG to evaluate for seizures. Report: This EEG was acquired with electrodes placed according to the International 10-20 electrode system (including Fp1, Fp2, F3, F4, C3, C4, P3, P4, O1, O2, T3, T4, T5, T6, A1, A2, Fz, Cz, Pz). The following electrodes were missing or displaced: none. There was no clear posterior dominant rhythm. The background was primarily composed of continuous theta frequencies at 5-6 Hz. This activity was reactive to stimulation. Towards the end of the recording there were occasional brief periods of diffuse suppression lasting 1-2 seconds. There was no focal slowing. There were a few brief instances of sleep architecture observed. There were no interictal epileptiform discharges. There were no electrographic seizures identified. Photic stimulation and hyperventilation were not performed. Impression and clinical correlation: This EEG was obtained while comatose and is abnormal sue to: - moderate diffuse slowing indicative of global cerebral dysfunction - brief periods of diffuse suppression lasting 1-2 seconds in the absence of sedation  Epileptiform abnormalities were not seen during this recording. Su Monks, MD [facility] [phone] If 7pm- 7am, please page neurology on call as listed in [place].   DG Chest Port 1 View  Result Date: 04/24/2022 CLINICAL DATA:  87 year old female intubated.   Respiratory failure. EXAM: PORTABLE CHEST 1 VIEW COMPARISON:  0436 hours today. FINDINGS: Portable AP semi upright view at 0856 hours. More rotated to the left. Endotracheal tube tip now 10 mm above the carina. Enteric tube courses to the abdomen and appears stable. Patchy and confluent bilateral lung base opacity persists. Increased pulmonary interstitial markings elsewhere appear less pronounced. Grossly stable mediastinal contours. No pneumothorax. Paucity of bowel gas and surgical clips in the visible upper abdomen. No acute osseous abnormality identified. IMPRESSION: 1. Satisfactory lines and tubes. 2. Ongoing Patchy and confluent bibasilar opacity. Interstitial edema appears improved since 0436 hours today. Electronically Signed   By: Genevie Ann M.D.   On: 04/24/2022 09:09   DG Chest Port 1 View  Result Date: 04/24/2022 CLINICAL DATA:  [id number]. Hypoxic respiratory failure with ventilator dependence. EXAM: PORTABLE CHEST 1 VIEW COMPARISON:  Portable April 22, 2022 FINDINGS: 4:36 a.m. ETT tip remains 1.5 cm from the carina. NGT tip is not in the field but based on the position of the proximal side-hole it is probably over the body of the stomach. Cardiomegaly. Stable central vascular prominence with mild interstitial edema. There are moderate pleural effusions and patchy consolidation or atelectasis in the base of both lungs, with suprahilar linear atelectatic foci again shown. The pleural effusions and basilar opacities appear slightly worsened on today's study. No interval change in the interstitial edema. IMPRESSION: 1. Unchanged perihilar vascular congestion and mild interstitial edema. 2. Worsening moderate pleural effusions and overlying lung opacities. 3. ETT tip 1.5 cm from carina. Recommend withdrawing 1-2 cm to a mid tracheal position. Electronically Signed   By: Telford Nab M.D.   On: 04/24/2022 06:28   MR BRAIN WO CONTRAST  Result Date: 04/23/2022 CLINICAL DATA:  Mental status change, unknown  cause. EXAM: MRI HEAD WITHOUT CONTRAST TECHNIQUE: Multiplanar, multiecho pulse sequences of the brain and surrounding structures were obtained without intravenous contrast. 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 No acute infarct, hemorrhage, a remote more focal cortical infarct is present in the high posteromedial right frontal lobe, best seen on axial FLAIR image 43 of series 19. No acute infarct, hemorrhage, or mass lesion is present. No significant white matter disease is present. The ventricles are of normal size. No significant extraaxial fluid collection is present. The internal auditory canals are within normal limits. The brainstem and cerebellum are within normal limits. Vascular: Flow is present in the major intracranial arteries. Skull and upper cervical spine: The craniocervical junction is normal. Upper cervical spine is within normal limits. Marrow signal is unremarkable. Sinuses/Orbits: Mild mucosal thickening is present throughout the ethmoid air cells frontal sinuses. No fluid levels are present. Small mastoid effusions are present bilaterally. Bilateral lens replacements are noted. Globes and orbits are otherwise unremarkable. IMPRESSION: 1. No acute intracranial abnormality. 2. 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 3. Remote more focal cortical infarct involving the high posteromedial right frontal lobe. 4. Mild sinus disease. Electronically Signed   By: San Morelle M.D.   On: 04/23/2022 15:14   DG Chest Port 1 View  Result Date: 04/22/2022 CLINICAL DATA:  Respiratory failure EXAM: PORTABLE CHEST 1 VIEW  COMPARISON:  04/25/2022 FINDINGS: Endotracheal tube tip terminates 1.5 cm above the carina. Enteric tube extends into the stomach. Stable heart size. Bilateral interstitial opacities, improving from prior, particularly within the right lung  base. Probable small bilateral pleural effusions. No pneumothorax is identified. IMPRESSION: 1. Improving bilateral interstitial opacities, particularly within the right lung base. Probable small bilateral pleural effusions. 2. Endotracheal tube tip terminates 1.5 cm above the carina. Recommend 1-2 cm of retraction for more optimal positioning. Electronically Signed   By: Davina Poke D.O.   On: 04/22/2022 16:40   ECHOCARDIOGRAM COMPLETE  Result Date: 04/22/2022    ECHOCARDIOGRAM REPORT   Patient Name:   AYA GEISEL Date of Exam: 04/22/2022 Medical Rec #:  [id number]       Height:       65.0 in Accession #:    [id number]      Weight:       197.0 lb Date of Birth:  1935-01-05       BSA:          1.965 m Patient Age:    83 years        BP:           116/48 mmHg Patient Gender: F               HR:           51 bpm. Exam Location:  [facility] Procedure: 2D Echo, Color Doppler and Cardiac Doppler Indications:     I50.9 Congestive Heart Failure  History:         Patient has prior history of Echocardiogram examinations, most                  recent 01/16/2020. COPD; Risk Factors:Hypertension, Diabetes,                  Dyslipidemia and Sleep Apnea.  Sonographer:     Charmayne Sheer Referring Phys:  [id number] [place] DGAYLI Diagnosing Phys: Serafina Royals MD  Sonographer Comments: Echo performed with patient supine and on artificial respirator, Technically challenging study due to limited acoustic windows, no apical window, no subcostal window and patient is obese. IMPRESSIONS  1. Left ventricular ejection fraction, by estimation, is 60 to 65%. The left ventricle has normal function. The left ventricle has no regional wall motion abnormalities. Left ventricular diastolic parameters were normal.  2. Right ventricular systolic function is normal. The right ventricular size is moderately enlarged.  3. Right atrial size was moderately dilated.  4. The mitral valve is normal in structure. Mild mitral valve regurgitation.  5. Tricuspid  valve regurgitation is mild to moderate.  6. The aortic valve is normal in structure. Aortic valve regurgitation is not visualized. FINDINGS  Left Ventricle: Left ventricular ejection fraction, by estimation, is 60 to 65%. The left ventricle has normal function. The left ventricle has no regional wall motion abnormalities. The left ventricular internal cavity size was small. There is no left ventricular hypertrophy. Left ventricular diastolic parameters were normal. Right Ventricle: The right ventricular size is moderately enlarged. No increase in right ventricular wall thickness. Right ventricular systolic function is normal. Left Atrium: Left atrial size was normal in size. Right Atrium: Right atrial size was moderately dilated. Pericardium: There is no evidence of pericardial effusion. Mitral Valve: The mitral valve is normal in structure. Mild mitral valve regurgitation. Tricuspid Valve: The tricuspid valve is normal in structure. Tricuspid valve regurgitation is mild to moderate. Aortic Valve: The aortic valve is normal in  structure. Aortic valve regurgitation is not visualized. Pulmonic Valve: The pulmonic valve was normal in structure. Pulmonic valve regurgitation is trivial. Aorta: The aortic root and ascending aorta are structurally normal, with no evidence of dilitation. IAS/Shunts: No atrial level shunt detected by color flow Doppler.  LEFT VENTRICLE PLAX 2D LVIDd:         3.71 cm   Diastology LVIDs:         2.12 cm   LV e' medial:    6.20 cm/s LV PW:         1.04 cm   LV E/e' medial:  15.3 LV IVS:        0.73 cm   LV e' lateral:   8.16 cm/s LVOT diam:     1.90 cm   LV E/e' lateral: 11.7 LVOT Area:     2.84 cm  RIGHT VENTRICLE RV Basal diam:  2.80 cm RV S prime:     13.40 cm/s LEFT ATRIUM           Index        RIGHT ATRIUM           Index LA diam:      3.80 cm 1.93 cm/m   RA Area:     14.30 cm LA Vol (A4C): 42.7 ml 21.73 ml/m  RA Volume:   32.40 ml  16.48 ml/m                        PULMONIC VALVE  AORTA                 PV Vmax:       1.55 m/s Ao Root diam: 3.00 cm PV Peak grad:  9.6 mmHg  MITRAL VALVE MV Area (PHT): 2.25 cm    SHUNTS MV Decel Time: 337 msec    Systemic Diam: 1.90 cm MV E velocity: 95.10 cm/s MV A velocity: 25.70 cm/s MV E/A ratio:  3.70 Serafina Royals MD Electronically signed by Serafina Royals MD Signature Date/Time: 04/22/2022/11:41:28 AM    Final    CT HEAD WO CONTRAST (5MM)  Result Date: 04/20/2022 CLINICAL DATA:  Altered mental status EXAM: CT HEAD WITHOUT CONTRAST TECHNIQUE: Contiguous axial images were obtained from the base of the skull through the vertex without intravenous contrast. RADIATION DOSE REDUCTION: This exam was performed according to the departmental dose-optimization program which includes automated exposure control, adjustment of the mA and/or kV according to patient size and/or use of iterative reconstruction technique. COMPARISON:  12/17/2016 FINDINGS: Brain: There are no signs of bleeding within the cranium. There is small focal encephalomalacia in the left temporoparietal cortex which was not seen in the previous examination. There is no focal edema or mass effect. Ventricles are not dilated. Vascular: Unremarkable. Skull: No fracture is seen in calvarium. Sinuses/Orbits: There are no air-fluid levels in paranasal sinuses. There is mucosal thickening in ethmoid sinus. Other: None. IMPRESSION: No acute intracranial findings are seen. There is small possible old infarct in the left temporoparietal cortex. Atrophy. Chronic ethmoid sinusitis. Electronically Signed   By: Elmer Picker M.D.   On: 04/20/2022 17:35   DG Chest Portable 1 View  Result Date: 04/23/2022 CLINICAL DATA:  S/p intubation EXAM: PORTABLE CHEST 1 VIEW.  Patient is rotated. COMPARISON:  Chest x-ray 05/04/2022 2:45 p.m. FINDINGS: Endotracheal tube terminates approximately 1.5 cm above the carina. Enteric tube courses below the hemidiaphragm with tip and side port collimated off view. The  heart and mediastinal contours  are unchanged. Slightly worsened diffuse patchy airspace opacities. No pulmonary edema. At least bilateral trace to small pleural effusions. No pneumothorax. No acute osseous abnormality. IMPRESSION: 1. Endotracheal tube terminates approximately 1.5 cm above the carina. Recommend retraction by 1 cm. 2. Enteric tube courses below the hemidiaphragm with tip and side port collimated off view. 3. Slightly worsened diffuse patchy airspace opacities. 4. At least bilateral trace to small pleural effusions. Electronically Signed   By: Iven Finn M.D.   On: 05/01/2022 16:27   DG Chest Portable 1 View  Result Date: 05/06/2022 CLINICAL DATA:  Not responding to family commands EXAM: PORTABLE CHEST 1 VIEW COMPARISON:  01/18/2020 FINDINGS: Bilateral interstitial and alveolar airspace opacities. Small bilateral pleural effusions. No pneumothorax. Stable cardiomegaly. No acute osseous abnormality. IMPRESSION: 1. Findings most consistent with CHF. Electronically Signed   By: Kathreen Devoid M.D.   On: 04/24/2022 14:51    Microbiology Recent Results (from the past 240 hour(s))  MRSA Next Gen by PCR, Nasal     Status: None   Collection Time: 04/13/2022  6:10 PM   Specimen: Nasal Mucosa; Nasal Swab  Result Value Ref Range Status   MRSA by PCR Next Gen NOT DETECTED NOT DETECTED Final    Comment: (NOTE) The GeneXpert MRSA Assay (FDA approved for NASAL specimens only), is one component of a comprehensive MRSA colonization surveillance program. It is not intended to diagnose MRSA infection nor to guide or monitor treatment for MRSA infections. Test performance is not FDA approved in patients less than 67 years old. Performed at [facility], [place], [place] [address]   Respiratory (~20 pathogens) panel by PCR     Status: None   Collection Time: 04/19/2022  7:57 PM   Specimen: Tracheal Aspirate; Respiratory  Result Value Ref Range Status   Adenovirus NOT  DETECTED NOT DETECTED Final   Coronavirus 229E NOT DETECTED NOT DETECTED Final    Comment: (NOTE) The Coronavirus on the Respiratory Panel, DOES NOT test for the novel  Coronavirus (2019 nCoV)    Coronavirus HKU1 NOT DETECTED NOT DETECTED Final   Coronavirus NL63 NOT DETECTED NOT DETECTED Final   Coronavirus OC43 NOT DETECTED NOT DETECTED Final   Metapneumovirus NOT DETECTED NOT DETECTED Final   Rhinovirus / Enterovirus NOT DETECTED NOT DETECTED Final   Influenza A NOT DETECTED NOT DETECTED Final   Influenza B NOT DETECTED NOT DETECTED Final   Parainfluenza Virus 1 NOT DETECTED NOT DETECTED Final   Parainfluenza Virus 2 NOT DETECTED NOT DETECTED Final   Parainfluenza Virus 3 NOT DETECTED NOT DETECTED Final   Parainfluenza Virus 4 NOT DETECTED NOT DETECTED Final   Respiratory Syncytial Virus NOT DETECTED NOT DETECTED Final   Bordetella pertussis NOT DETECTED NOT DETECTED Final   Bordetella Parapertussis NOT DETECTED NOT DETECTED Final   Chlamydophila pneumoniae NOT DETECTED NOT DETECTED Final   Mycoplasma pneumoniae NOT DETECTED NOT DETECTED Final    Comment: Performed at [facility], [place]. [address], [place], [place] [address]  Culture, Respiratory w Gram Stain     Status: None   Collection Time: 04/22/22  3:11 PM   Specimen: Tracheal Aspirate; Respiratory  Result Value Ref Range Status   Specimen Description   Final    TRACHEAL ASPIRATE Performed at [facility], [address] [place], [place] [address]    Special Requests   Final    NONE Performed at [facility], [address] [place], [place] [address]  Gram Stain   Final    FEW WBC PRESENT, PREDOMINANTLY PMN FEW GRAM NEGATIVE RODS RARE GRAM POSITIVE COCCI IN CLUSTERS    Culture   Final    MODERATE Consistent with normal respiratory flora. No Pseudomonas species isolated Performed at [facility] [address], [place], [place] [address]    Report Status 04/25/2022 FINAL  Final   Culture, blood (Routine X 2) w Reflex to ID Panel     Status: None   Collection Time: 04/22/22  3:35 PM   Specimen: BLOOD  Result Value Ref Range Status   Specimen Description BLOOD WRIST  Final   Special Requests   Final    BOTTLES DRAWN AEROBIC AND ANAEROBIC Blood Culture adequate volume   Culture   Final    NO GROWTH 5 DAYS Performed at [facility], [address], [place], [place] [address]    Report Status 04/27/2022 FINAL  Final  Culture, blood (Routine X 2) w Reflex to ID Panel     Status: None   Collection Time: 04/22/22  3:48 PM   Specimen: BLOOD  Result Value Ref Range Status   Specimen Description BLOOD BLOOD RIGHT ARM  Final   Special Requests   Final    BOTTLES DRAWN AEROBIC AND ANAEROBIC Blood Culture adequate volume   Culture   Final    NO GROWTH 5 DAYS Performed at [facility], [address], [place], [place] [address]    Report Status 04/27/2022 FINAL  Final    Lab Basic Metabolic Panel: Recent Labs  Lab 04/25/22 0550  NA 143  K 3.5  CL 106  CO2 29  GLUCOSE 193*  BUN 51*  CREATININE 1.29*  CALCIUM 8.0*  MG 2.0  PHOS 2.1*   Liver Function Tests: No results for input(s): "AST", "ALT", "ALKPHOS", "BILITOT", "PROT", "ALBUMIN" in the last 168 hours. No results for input(s): "LIPASE", "AMYLASE" in the last 168 hours. No results for input(s): "AMMONIA" in the last 168 hours. CBC: Recent Labs  Lab 04/25/22 0550  WBC 6.0  HGB 9.8*  HCT 31.7*  MCV 87.8  PLT 117*   Cardiac Enzymes: Recent Labs  Lab 04/25/22 0550  CKTOTAL 148   Sepsis Labs: Recent Labs  Lab 04/25/22 0550  WBC 6.0      Candee Furbish 05/01/2022, 4:40 PM

## 2022-05-12 NOTE — Progress Notes (Signed)
Patient was kept comfortable throughout the shift using morphine gtt and PRN ativan. Family remained at the bedside. TOD [id number].

## 2022-05-12 DEATH — deceased

## 2022-05-16 ENCOUNTER — Ambulatory Visit: Payer: Medicare Other | Admitting: Family

## 2022-06-28 NOTE — Telephone Encounter (Signed)
Signing encounter, See previous note 01/14/22
